# Patient Record
Sex: Male | Born: 1965
Health system: Southern US, Community
[De-identification: ages and names within clinical notes are randomized; demographics above are authoritative.]

## PROBLEM LIST (undated history)

## (undated) DIAGNOSIS — G4700 Insomnia, unspecified: Secondary | ICD-10-CM

## (undated) DIAGNOSIS — K3580 Unspecified acute appendicitis: Secondary | ICD-10-CM

## (undated) DIAGNOSIS — F1011 Alcohol abuse, in remission: Secondary | ICD-10-CM

## (undated) DIAGNOSIS — F419 Anxiety disorder, unspecified: Secondary | ICD-10-CM

## (undated) DIAGNOSIS — F329 Major depressive disorder, single episode, unspecified: Secondary | ICD-10-CM

## (undated) DIAGNOSIS — I10 Essential (primary) hypertension: Secondary | ICD-10-CM

## (undated) DIAGNOSIS — R079 Chest pain, unspecified: Secondary | ICD-10-CM

## (undated) DIAGNOSIS — M1990 Unspecified osteoarthritis, unspecified site: Secondary | ICD-10-CM

## (undated) HISTORY — DX: Unspecified acute appendicitis: K35.80

## (undated) HISTORY — DX: Alcohol abuse, in remission: F10.11

## (undated) HISTORY — PX: TONSILLECTOMY: SUR1361

## (undated) HISTORY — DX: Anxiety disorder, unspecified: F41.9

## (undated) HISTORY — DX: Unspecified osteoarthritis, unspecified site: M19.90

## (undated) HISTORY — PX: SEPTOPLASTY: SHX2393

## (undated) HISTORY — DX: Major depressive disorder, single episode, unspecified: F32.9

## (undated) HISTORY — DX: Insomnia, unspecified: G47.00

## (undated) HISTORY — DX: Chest pain, unspecified: R07.9

---

## 2004-05-22 ENCOUNTER — Ambulatory Visit: Payer: Self-pay | Admitting: Internal Medicine

## 2004-06-13 ENCOUNTER — Ambulatory Visit: Payer: Self-pay | Admitting: Internal Medicine

## 2004-12-03 ENCOUNTER — Ambulatory Visit: Payer: Self-pay | Admitting: Internal Medicine

## 2005-02-21 ENCOUNTER — Other Ambulatory Visit: Payer: Self-pay

## 2005-02-21 ENCOUNTER — Emergency Department: Payer: Self-pay | Admitting: Emergency Medicine

## 2005-02-21 ENCOUNTER — Encounter: Payer: Self-pay | Admitting: Internal Medicine

## 2005-06-30 ENCOUNTER — Emergency Department: Payer: Self-pay | Admitting: Emergency Medicine

## 2005-11-27 ENCOUNTER — Ambulatory Visit: Payer: Self-pay | Admitting: Internal Medicine

## 2006-05-14 ENCOUNTER — Ambulatory Visit: Payer: Self-pay | Admitting: Internal Medicine

## 2006-06-18 ENCOUNTER — Ambulatory Visit: Payer: Self-pay | Admitting: Internal Medicine

## 2006-11-18 ENCOUNTER — Telehealth: Payer: Self-pay | Admitting: Internal Medicine

## 2006-11-19 ENCOUNTER — Ambulatory Visit: Payer: Self-pay | Admitting: Internal Medicine

## 2006-11-19 DIAGNOSIS — R519 Headache, unspecified: Secondary | ICD-10-CM | POA: Insufficient documentation

## 2006-11-19 DIAGNOSIS — R51 Headache: Secondary | ICD-10-CM

## 2007-06-07 ENCOUNTER — Telehealth (INDEPENDENT_AMBULATORY_CARE_PROVIDER_SITE_OTHER): Payer: Self-pay | Admitting: *Deleted

## 2007-06-10 DIAGNOSIS — G479 Sleep disorder, unspecified: Secondary | ICD-10-CM | POA: Insufficient documentation

## 2007-06-10 DIAGNOSIS — M4800 Spinal stenosis, site unspecified: Secondary | ICD-10-CM | POA: Insufficient documentation

## 2007-06-10 DIAGNOSIS — F341 Dysthymic disorder: Secondary | ICD-10-CM

## 2007-06-10 DIAGNOSIS — F411 Generalized anxiety disorder: Secondary | ICD-10-CM | POA: Insufficient documentation

## 2007-08-09 ENCOUNTER — Telehealth (INDEPENDENT_AMBULATORY_CARE_PROVIDER_SITE_OTHER): Payer: Self-pay | Admitting: *Deleted

## 2007-09-29 ENCOUNTER — Telehealth: Payer: Self-pay | Admitting: Internal Medicine

## 2007-10-05 ENCOUNTER — Telehealth (INDEPENDENT_AMBULATORY_CARE_PROVIDER_SITE_OTHER): Payer: Self-pay | Admitting: *Deleted

## 2007-10-05 ENCOUNTER — Encounter (INDEPENDENT_AMBULATORY_CARE_PROVIDER_SITE_OTHER): Payer: Self-pay | Admitting: *Deleted

## 2007-12-03 ENCOUNTER — Telehealth (INDEPENDENT_AMBULATORY_CARE_PROVIDER_SITE_OTHER): Payer: Self-pay | Admitting: *Deleted

## 2007-12-07 ENCOUNTER — Ambulatory Visit: Payer: Self-pay | Admitting: Internal Medicine

## 2008-01-24 ENCOUNTER — Emergency Department: Payer: Self-pay | Admitting: Emergency Medicine

## 2008-01-24 ENCOUNTER — Other Ambulatory Visit: Payer: Self-pay

## 2008-03-29 ENCOUNTER — Telehealth (INDEPENDENT_AMBULATORY_CARE_PROVIDER_SITE_OTHER): Payer: Self-pay | Admitting: *Deleted

## 2008-04-27 ENCOUNTER — Telehealth: Payer: Self-pay | Admitting: Internal Medicine

## 2008-06-23 ENCOUNTER — Telehealth: Payer: Self-pay | Admitting: Internal Medicine

## 2008-09-22 ENCOUNTER — Telehealth: Payer: Self-pay | Admitting: Internal Medicine

## 2009-01-17 ENCOUNTER — Telehealth: Payer: Self-pay | Admitting: Internal Medicine

## 2009-02-26 ENCOUNTER — Telehealth: Payer: Self-pay | Admitting: Internal Medicine

## 2009-04-12 ENCOUNTER — Emergency Department: Payer: Self-pay

## 2013-01-15 DIAGNOSIS — R079 Chest pain, unspecified: Secondary | ICD-10-CM

## 2013-01-15 HISTORY — DX: Chest pain, unspecified: R07.9

## 2013-02-03 DIAGNOSIS — G4700 Insomnia, unspecified: Secondary | ICD-10-CM

## 2013-02-03 DIAGNOSIS — M1990 Unspecified osteoarthritis, unspecified site: Secondary | ICD-10-CM

## 2013-02-03 HISTORY — DX: Unspecified osteoarthritis, unspecified site: M19.90

## 2013-02-03 HISTORY — DX: Insomnia, unspecified: G47.00

## 2014-05-09 ENCOUNTER — Emergency Department: Payer: Self-pay | Admitting: Emergency Medicine

## 2014-11-20 ENCOUNTER — Encounter: Payer: Self-pay | Admitting: Urgent Care

## 2014-11-20 DIAGNOSIS — F10230 Alcohol dependence with withdrawal, uncomplicated: Secondary | ICD-10-CM | POA: Diagnosis present

## 2014-11-20 DIAGNOSIS — Z720 Tobacco use: Secondary | ICD-10-CM | POA: Diagnosis not present

## 2014-11-20 DIAGNOSIS — R197 Diarrhea, unspecified: Secondary | ICD-10-CM | POA: Diagnosis not present

## 2014-11-20 DIAGNOSIS — R112 Nausea with vomiting, unspecified: Secondary | ICD-10-CM | POA: Diagnosis not present

## 2014-11-20 DIAGNOSIS — M791 Myalgia: Secondary | ICD-10-CM | POA: Insufficient documentation

## 2014-11-20 NOTE — ED Notes (Signed)
Patient presents with multiple c/o. Patient reporting that he has generalized body aches, N/V/D and non-specific chest pain since last night. Patient is dizzy as well. Of note, patient advising that he is an alcoholic and has not had a drink in 3 days. No sweating or tremors noted in triage.

## 2014-11-21 ENCOUNTER — Emergency Department
Admission: EM | Admit: 2014-11-21 | Discharge: 2014-11-21 | Disposition: A | Discharge status: Home / Self Care | Payer: PRIVATE HEALTH INSURANCE | Attending: Emergency Medicine | Admitting: Emergency Medicine

## 2014-11-21 DIAGNOSIS — F10930 Alcohol use, unspecified with withdrawal, uncomplicated: Secondary | ICD-10-CM

## 2014-11-21 DIAGNOSIS — F10230 Alcohol dependence with withdrawal, uncomplicated: Secondary | ICD-10-CM

## 2014-11-21 LAB — BASIC METABOLIC PANEL
Anion gap: 13 (ref 5–15)
BUN: 17 mg/dL (ref 6–20)
CO2: 27 mmol/L (ref 22–32)
CREATININE: 1.16 mg/dL (ref 0.61–1.24)
Calcium: 9 mg/dL (ref 8.9–10.3)
Chloride: 98 mmol/L — ABNORMAL LOW (ref 101–111)
GLUCOSE: 120 mg/dL — AB (ref 65–99)
POTASSIUM: 3.6 mmol/L (ref 3.5–5.1)
Sodium: 138 mmol/L (ref 135–145)

## 2014-11-21 LAB — CBC
HCT: 49.8 % (ref 40.0–52.0)
HEMOGLOBIN: 17 g/dL (ref 13.0–18.0)
MCH: 34 pg (ref 26.0–34.0)
MCHC: 34.1 g/dL (ref 32.0–36.0)
MCV: 99.7 fL (ref 80.0–100.0)
PLATELETS: 301 10*3/uL (ref 150–440)
RBC: 4.99 MIL/uL (ref 4.40–5.90)
RDW: 13.4 % (ref 11.5–14.5)
WBC: 14.8 10*3/uL — AB (ref 3.8–10.6)

## 2014-11-21 LAB — ETHANOL

## 2014-11-21 LAB — TROPONIN I

## 2014-11-21 MED ORDER — LORAZEPAM 1 MG PO TABS: 1.0000 mg | ORAL_TABLET | Freq: Four times a day (QID) | ORAL | Status: DC | PRN

## 2014-11-21 MED ORDER — SODIUM CHLORIDE 0.9 % IV BOLUS (SEPSIS)
1000.0000 mL | Freq: Once | INTRAVENOUS | Status: AC
  Administered 2014-11-21: 1000 mL via INTRAVENOUS

## 2014-11-21 MED ORDER — ONDANSETRON HCL 4 MG/2ML IJ SOLN
4.0000 mg | Freq: Once | INTRAMUSCULAR | Status: AC
  Administered 2014-11-21: 4 mg via INTRAVENOUS

## 2014-11-21 MED ORDER — LORAZEPAM 1 MG PO TABS
ORAL_TABLET | ORAL | Status: AC
  Administered 2014-11-21: 1 mg
  Filled 2014-11-21: qty 1

## 2014-11-21 MED ORDER — LORAZEPAM 2 MG/ML IJ SOLN
1.0000 mg | Freq: Once | INTRAMUSCULAR | Status: AC
  Administered 2014-11-21: 1 mg via INTRAVENOUS

## 2014-11-21 MED ORDER — LORAZEPAM 2 MG/ML IJ SOLN
INTRAMUSCULAR | Status: AC
  Administered 2014-11-21: 1 mg via INTRAVENOUS
  Filled 2014-11-21: qty 1

## 2014-11-21 MED ORDER — ONDANSETRON HCL 4 MG/2ML IJ SOLN
INTRAMUSCULAR | Status: AC
  Administered 2014-11-21: 4 mg via INTRAVENOUS
  Filled 2014-11-21: qty 2

## 2014-11-21 NOTE — ED Notes (Signed)

## 2014-11-21 NOTE — ED Provider Notes (Signed)
[facility] Emergency Department Provider Note  ____________________________________________  Time seen: On arrival to room  I have reviewed the triage vital signs and the nursing notes.   HISTORY  Chief Complaint Withdrawal; Diarrhea; Chest Pain; and Generalized Body Aches      HPI Ryan Hatfield is a 49 y.o. male presents with "alcohol withdrawal". Patient admits to generalized body aches nausea vomiting and diarrhea since last night. Patient states last alcohol intake was 3 days ago. Patient admits to drinking heavily daily "I'm a alcoholic".     History reviewed. No pertinent past medical history.  Patient Active Problem List   Diagnosis Date Noted  . ANXIETY 06/10/2007  . DYSTHYMIA 06/10/2007  . SPINAL STENOSIS 06/10/2007  . SLEEP DISORDER 06/10/2007  . HEADACHE 11/19/2006    Past Surgical History  Procedure Laterality Date  . Septoplasty      No current outpatient prescriptions on file.  Allergies Paroxetine and Sertraline hcl  No family history on file.  Social History History  Substance Use Topics  . Smoking status: Current Every Day Smoker  . Smokeless tobacco: Not on file  . Alcohol Use: Yes     Comment: last drink x 3 days ago    Review of Systems  Constitutional: Negative for fever. Eyes: Negative for visual changes. ENT: Negative for sore throat. Cardiovascular: Negative for chest pain. Respiratory: Negative for shortness of breath. Gastrointestinal: Negative for abdominal pain, vomiting and diarrhea. Genitourinary: Negative for dysuria. Musculoskeletal: Positive for generalized muscle aches Skin: Negative for rash. Neurological: Negative for headaches, focal weakness or numbness.   10-point ROS otherwise negative.  ____________________________________________   PHYSICAL EXAM:  VITAL SIGNS: ED Triage Vitals  Enc Vitals Group     BP 11/20/14 2225 193/88 mmHg     Pulse Rate 11/20/14 2225 112   Resp 11/20/14 2225 20     Temp 11/20/14 2225 98.2 F (36.8 C)     Temp Source 11/20/14 2225 Oral     SpO2 11/20/14 2225 97 %     Weight 11/20/14 2225 220 lb (99.791 kg)     Height 11/20/14 2225 5\' 9"  (1.753 m)     Head Cir --      Peak Flow --      Pain Score 11/20/14 2239 5     Pain Loc --      Pain Edu? --    Constitutional: Alert and oriented. Well appearing and in no distress. Eyes: Conjunctivae are normal. PERRL. Normal extraocular movements. ENT   Head: Normocephalic and atraumatic.   Nose: No congestion/rhinnorhea.   Mouth/Throat: Mucous membranes are moist.   Neck: No stridor.  Cardiovascular: Normal rate, regular rhythm. Normal and symmetric distal pulses are present in all extremities. No murmurs, rubs, or gallops. Respiratory: Normal respiratory effort without tachypnea nor retractions. Breath sounds are clear and equal bilaterally. No wheezes/rales/rhonchi. Gastrointestinal: Soft and nontender. No distention. There is no CVA tenderness. Genitourinary: deferred Musculoskeletal: Nontender with normal range of motion in all extremities. No joint effusions.  No lower extremity tenderness nor edema. Neurologic:  Normal speech and language. No gross focal neurologic deficits are appreciated. Speech is normal.  Skin:  Skin is warm, dry and intact. No rash noted. Psychiatric: Mood and affect are normal. Speech and behavior are normal. Patient exhibits appropriate insight and judgment.  ____________________________________________    LABS (pertinent positives/negatives)  Labs Reviewed  CBC - Abnormal; Notable for the following:    WBC 14.8 (*)    All other  components within normal limits  BASIC METABOLIC PANEL - Abnormal; Notable for the following:    Chloride 98 (*)    Glucose, Bld 120 (*)    All other components within normal limits  TROPONIN I  ETHANOL     ____________________________________________       INITIAL IMPRESSION / ASSESSMENT AND  PLAN / ED COURSE  Pertinent labs & imaging results that were available during my care of the patient were reviewed by me and considered in my medical decision making (see chart for details).  History of physical exam consistent with alcohol withdrawal. As such patient received Ativan 1 mg IV with improvement of symptoms. Offered admission for alcohol withdrawal however patient states he would prefer to do outpatient. We'll prescribe Ativan at home for outpatient. Patient advised of the danger of drinking while taking Ativan.  ____________________________________________   FINAL CLINICAL IMPRESSION(S) / ED DIAGNOSES  Final diagnoses:  Alcohol withdrawal, uncomplicated      Gregor Hams, MD 11/21/14 [phone]

## 2014-11-21 NOTE — Discharge Instructions (Signed)
Alcohol Withdrawal °Anytime drug use is interfering with normal living activities it has become abuse. This includes problems with family and friends. Psychological dependence has developed when your mind tells you that the drug is needed. This is usually followed by physical dependence when a continuing increase of drugs are required to get the same feeling or "high." This is known as addiction or chemical dependency. A person's risk is much higher if there is a history of chemical dependency in the family. °Mild Withdrawal Following Stopping Alcohol, When Addiction or Chemical Dependency Has Developed °When a person has developed tolerance to alcohol, any sudden stopping of alcohol can cause uncomfortable physical symptoms. Most of the time these are mild and consist of tremors in the hands and increases in heart rate, breathing, and temperature. Sometimes these symptoms are associated with anxiety, panic attacks, and bad dreams. There may also be stomach upset. Normal sleep patterns are often interrupted with periods of inability to sleep (insomnia). This may last for 6 months. Because of this discomfort, many people choose to continue drinking to get rid of this discomfort and to try to feel normal. °Severe Withdrawal with Decreased or No Alcohol Intake, When Addiction or Chemical Dependency Has Developed °About five percent of alcoholics will develop signs of severe withdrawal when they stop using alcohol. One sign of this is development of generalized seizures (convulsions). Other signs of this are severe agitation and confusion. This may be associated with believing in things which are not real or seeing things which are not really there (delusions and hallucinations). Vitamin deficiencies are usually present if alcohol intake has been long-term. Treatment for this most often requires hospitalization and close observation. °Addiction can only be helped by stopping use of all chemicals. This is hard but may  save your life. With continual alcohol use, possible outcomes are usually loss of self respect and esteem, violence, and death. °Addiction cannot be cured but it can be stopped. This often requires outside help and the care of professionals. Treatment centers are listed in the yellow pages under Cocaine, Narcotics, and Alcoholics Anonymous. Most hospitals and clinics can refer you to a specialized care center. °It is not necessary for you to go through the uncomfortable symptoms of withdrawal. Your caregiver can provide you with medicines that will help you through this difficult period. Try to avoid situations, friends, or drugs that made it possible for you to keep using alcohol in the past. Learn how to say no. °It takes a long period of time to overcome addictions to all drugs, including alcohol. There may be many times when you feel as though you want a drink. After getting rid of the physical addiction and withdrawal, you will have a lessening of the craving which tells you that you need alcohol to feel normal. Call your caregiver if more support is needed. Learn who to talk to in your family and among your friends so that during these periods you can receive outside help. Alcoholics Anonymous (AA) has helped many people over the years. To get further help, contact AA or call your caregiver, counselor, or clergyperson. Al-Anon and Alateen are support groups for friends and family members of an alcoholic. The people who love and care for an alcoholic often need help, too. For information about these organizations, check your phone directory or call a local alcoholism treatment center.  °SEEK IMMEDIATE MEDICAL CARE IF:  °· You have a seizure. °· You have a fever. °· You experience uncontrolled vomiting or you   vomit up blood. This may be bright red or look like black coffee grounds. °· You have blood in the stool. This may be bright red or appear as a black, tarry, bad-smelling stool. °· You become lightheaded or  faint. Do not drive if you feel this way. Have someone else drive you or call 911 for help. °· You become more agitated or confused. °· You develop uncontrolled anxiety. °· You begin to see things that are not really there (hallucinate). °Your caregiver has determined that you completely understand your medical condition, and that your mental state is back to normal. You understand that you have been treated for alcohol withdrawal, have agreed not to drink any alcohol for a minimum of 1 day, will not operate a car or other machinery for 24 hours, and have had an opportunity to ask any questions about your condition. °Document Released: 04/02/2005 Document Revised: 09/15/2011 Document Reviewed: 02/09/2008 °ExitCare® Patient Information ©2015 [employer]. This information is not intended to replace advice given to you by your health care provider. Make sure you discuss any questions you have with your health care provider. ° °

## 2015-05-29 ENCOUNTER — Ambulatory Visit (INDEPENDENT_AMBULATORY_CARE_PROVIDER_SITE_OTHER): Payer: Managed Care, Other (non HMO) | Admitting: Internal Medicine

## 2015-05-29 ENCOUNTER — Ambulatory Visit (INDEPENDENT_AMBULATORY_CARE_PROVIDER_SITE_OTHER)
Admission: RE | Admit: 2015-05-29 | Discharge: 2015-05-29 | Disposition: A | Discharge status: Home / Self Care | Payer: Managed Care, Other (non HMO) | Source: Ambulatory Visit | Attending: Internal Medicine | Admitting: Internal Medicine

## 2015-05-29 ENCOUNTER — Encounter (INDEPENDENT_AMBULATORY_CARE_PROVIDER_SITE_OTHER): Payer: Self-pay

## 2015-05-29 ENCOUNTER — Encounter: Payer: Self-pay | Admitting: Internal Medicine

## 2015-05-29 VITALS — BP 120/56 | HR 57 | Temp 98.0°F | Wt 242.0 lb

## 2015-05-29 DIAGNOSIS — Z720 Tobacco use: Secondary | ICD-10-CM

## 2015-05-29 DIAGNOSIS — F418 Other specified anxiety disorders: Secondary | ICD-10-CM

## 2015-05-29 DIAGNOSIS — M25532 Pain in left wrist: Secondary | ICD-10-CM | POA: Diagnosis not present

## 2015-05-29 DIAGNOSIS — F419 Anxiety disorder, unspecified: Secondary | ICD-10-CM

## 2015-05-29 DIAGNOSIS — F32A Depression, unspecified: Secondary | ICD-10-CM

## 2015-05-29 DIAGNOSIS — R5383 Other fatigue: Secondary | ICD-10-CM

## 2015-05-29 DIAGNOSIS — Z833 Family history of diabetes mellitus: Secondary | ICD-10-CM | POA: Diagnosis not present

## 2015-05-29 DIAGNOSIS — F329 Major depressive disorder, single episode, unspecified: Secondary | ICD-10-CM

## 2015-05-29 DIAGNOSIS — F1011 Alcohol abuse, in remission: Secondary | ICD-10-CM | POA: Insufficient documentation

## 2015-05-29 DIAGNOSIS — F17200 Nicotine dependence, unspecified, uncomplicated: Secondary | ICD-10-CM

## 2015-05-29 DIAGNOSIS — R600 Localized edema: Secondary | ICD-10-CM

## 2015-05-29 DIAGNOSIS — M79641 Pain in right hand: Secondary | ICD-10-CM

## 2015-05-29 DIAGNOSIS — F1010 Alcohol abuse, uncomplicated: Secondary | ICD-10-CM

## 2015-05-29 DIAGNOSIS — M79642 Pain in left hand: Secondary | ICD-10-CM

## 2015-05-29 DIAGNOSIS — E559 Vitamin D deficiency, unspecified: Secondary | ICD-10-CM

## 2015-05-29 HISTORY — DX: Alcohol abuse, in remission: F10.11

## 2015-05-29 HISTORY — DX: Depression, unspecified: F32.A

## 2015-05-29 HISTORY — DX: Anxiety disorder, unspecified: F41.9

## 2015-05-29 LAB — CBC
HCT: 44.8 % (ref 39.0–52.0)
Hemoglobin: 14.9 g/dL (ref 13.0–17.0)
MCHC: 33.4 g/dL (ref 30.0–36.0)
MCV: 90.2 fl (ref 78.0–100.0)
Platelets: 272 10*3/uL (ref 150.0–400.0)
RBC: 4.97 Mil/uL (ref 4.22–5.81)
RDW: 13.7 % (ref 11.5–15.5)
WBC: 11.3 10*3/uL — AB (ref 4.0–10.5)

## 2015-05-29 LAB — COMPREHENSIVE METABOLIC PANEL
ALBUMIN: 4.3 g/dL (ref 3.5–5.2)
ALT: 14 U/L (ref 0–53)
AST: 18 U/L (ref 0–37)
Alkaline Phosphatase: 81 U/L (ref 39–117)
BUN: 15 mg/dL (ref 6–23)
CHLORIDE: 102 meq/L (ref 96–112)
CO2: 29 mEq/L (ref 19–32)
CREATININE: 1.02 mg/dL (ref 0.40–1.50)
Calcium: 9.5 mg/dL (ref 8.4–10.5)
GFR: 81.59 mL/min (ref >60.00)
Glucose, Bld: 113 mg/dL — ABNORMAL HIGH (ref 70–99)
Potassium: 3.8 mEq/L (ref 3.5–5.1)
Sodium: 139 mEq/L (ref 135–145)
TOTAL PROTEIN: 7.2 g/dL (ref 6.0–8.3)
Total Bilirubin: 0.6 mg/dL (ref 0.2–1.2)

## 2015-05-29 LAB — VITAMIN B12: VITAMIN B 12: 255 pg/mL (ref 211–911)

## 2015-05-29 LAB — TSH: TSH: 4.43 u[IU]/mL (ref 0.35–4.50)

## 2015-05-29 LAB — HEMOGLOBIN A1C: HEMOGLOBIN A1C: 5.7 % (ref 4.6–6.5)

## 2015-05-29 LAB — VITAMIN D 25 HYDROXY (VIT D DEFICIENCY, FRACTURES): VITD: 14.39 ng/mL — AB (ref 30.00–100.00)

## 2015-05-29 LAB — BRAIN NATRIURETIC PEPTIDE: Pro B Natriuretic peptide (BNP): 14 pg/mL (ref 0.0–100.0)

## 2015-05-29 NOTE — Progress Notes (Signed)
Pre visit review using our clinic review tool, if applicable. No additional management support is needed unless otherwise documented below in the visit note. 

## 2015-05-29 NOTE — Progress Notes (Signed)
HPI  Pt presents to the clinic today to establish care and for management of the conditions listed below. He is transferring care from Dr. Silvio Pate, but he has not seen him in the last 6 years.  He does c/o bilateral hand pain. This started > 1 year ago. The left seems worse than the right. He reports the pain is constants. It aches all the time. He can have sharp, stabbing pain with movement. He has noticed some swelling of the hands and wrist but denies any redness or warmth. He denies any injury to the area. He has no history of gout. He has tried Ibuprofen with some relief.  History of alcohol abuse: He has been sober for 5 months.  Anxiety and Depression: Chronic but he is not medicated. He feels like his symptoms were worse when he was drinking. He reports he actually feels pretty good right now. He denies SI/HI.  Smoker: 1 ppd x 40 years. He is not interested in quitting.  Flu: He reports he has already had on this year Tetanus: 2001 PSA Screening: > 5 years ago Colon Screening: > 5 years ago Vision Screening: as needed Dentist: as needed  Past Medical History  Diagnosis Date  . Anxiety   . Depression     Current Outpatient Prescriptions  Medication Sig Dispense Refill  . ibuprofen (ADVIL,MOTRIN) 100 MG tablet Take 100 mg by mouth every 6 (six) hours as needed for fever.     No current facility-administered medications for this visit.    Allergies  Allergen Reactions  . Paroxetine     REACTION: paranoia, confusion  . Sertraline Hcl     REACTION: paranoia, confusion    Family History  Problem Relation Age of Onset  . Heart disease Mother   . Arthritis Mother   . Heart disease Father   . Diabetes Brother   . Stroke Brother   . Cancer Maternal Grandmother     Social History   Social History  . Marital Status: Divorced    Spouse Name: N/A  . Number of Children: N/A  . Years of Education: N/A   Occupational History  . Not on file.   Social History Main  Topics  . Smoking status: Current Every Day Smoker -- 1.00 packs/day for 40 years  . Smokeless tobacco: Not on file  . Alcohol Use: No     Comment: last drink x 3 days ago  . Drug Use: No  . Sexual Activity: Yes   Other Topics Concern  . Not on file   Social History Narrative    ROS:  Constitutional: Pt reports weight gain and fatigue. Denies fever, malaise, headache.  Respiratory: Pt reports shortness of breath at times. Denies difficulty breathing, cough or sputum production.   Cardiovascular: Denies chest pain, chest tightness, palpitations or swelling in the hands or feet.  Gastrointestinal: Denies abdominal pain, bloating, constipation, diarrhea or blood in the stool.  GU: Denies frequency, urgency, pain with urination, blood in urine, odor or discharge. Musculoskeletal: Pt reports bilateral hand pain and swelling. Denies decrease in range of motion, difficulty with gait, muscle pain.  Skin: Denies redness, rashes, lesions or ulcercations.  Neurological: Pt reports numbness in her hands. Denies dizziness, difficulty with memory, difficulty with speech or problems with balance and coordination.  Psych: Pt reports chronic anxiety and depression. Denies SI/HI.  No other specific complaints in a complete review of systems (except as listed in HPI above).  PE:  BP 120/56 mmHg  Pulse  57  Temp(Src) 98 F (36.7 C) (Oral)  Wt 242 lb (109.77 kg)  SpO2 98% Wt Readings from Last 3 Encounters:  05/29/15 242 lb (109.77 kg)  11/20/14 220 lb (99.791 kg)  12/07/07 206 lb 6.1 oz (93.614 kg)    General: Appears his stated age, obese in NAD. Skin: Warm, dry and intact. No redness or warmth noted. HEENT: mucous membranes moist. No lesions noted. Cardiovascular: Normal rate and rhythm. S1,S2 noted.  No murmur, rubs or gallops noted. Trace BLE edema. No carotid bruits noted. Pulmonary/Chest: Normal effort, diminished with fine crackles in the bases. No respiratory distress. No wheezes, or  ronchi noted.  Musculoskeletal: Normal flexion and extension of bilateral wrist. He has decreased rotation secondary to pain. No swelling noted today. He does have some enlarged joints in his fingers bilaterally. He has pain with palpation of the left medial wrist over the extensor service of the thumb.  Neurological: Alert and oriented.  Psychiatric: Mood and affect normal. Behavior is normal. Judgment and thought content normal.    BMET    Component Value Date/Time   NA 138 11/20/2014 2203   K 3.6 11/20/2014 2203   CL 98* 11/20/2014 2203   CO2 27 11/20/2014 2203   GLUCOSE 120* 11/20/2014 2203   BUN 17 11/20/2014 2203   CREATININE 1.16 11/20/2014 2203   CALCIUM 9.0 11/20/2014 2203   GFRNONAA >60 11/20/2014 2203   GFRAA >60 11/20/2014 2203    Lipid Panel  No results found for: CHOL, TRIG, HDL, CHOLHDL, VLDL, LDLCALC  CBC    Component Value Date/Time   WBC 14.8* 11/20/2014 2203   RBC 4.99 11/20/2014 2203   HGB 17.0 11/20/2014 2203   HCT 49.8 11/20/2014 2203   PLT 301 11/20/2014 2203   MCV 99.7 11/20/2014 2203   MCH 34.0 11/20/2014 2203   MCHC 34.1 11/20/2014 2203   RDW 13.4 11/20/2014 2203    Hgb A1C No results found for: HGBA1C   Assessment and Plan:  Smoker:  ? Wether he has COPD or emphysema Unmotivated to quit Chest xray today  Bilateral hand pain:  ? Arthritis versus tendonitis Will check xray of left wrist today If xray normal, consider burst of Prednisone Try Aleve once daily  Alcohol abuse, in remission:  Support offered today  Anxiety and Depression:  Chronic but stable off meds.  Will continue to monitor  Bilateral leg edema:  Given crackles in lungs, will check CBC, CMET and BNP today  Fatigue:  TSH, Vit D and B12 today  Family history of DM 2:  Will check A1C today  Make an appt for your annual exam

## 2015-05-29 NOTE — Patient Instructions (Signed)
Wrist Pain There are many things that can cause wrist pain. Some common causes include:  An injury to the wrist area, such as a sprain, strain, or fracture.  Overuse of the joint.  A condition that causes increased pressure on a nerve in the wrist (carpal tunnel syndrome).  Wear and tear of the joints that occurs with aging (osteoarthritis).  A variety of other types of arthritis. Sometimes, the cause of wrist pain is not known. The pain often goes away when you follow your health care provider's instructions for relieving pain at home. If your wrist pain continues, tests may need to be done to diagnose your condition. HOME CARE INSTRUCTIONS Pay attention to any changes in your symptoms. Take these actions to help with your pain:  Rest the wrist area for at least 48 hours or as told by your health care provider.  If directed, apply ice to the injured area:  Put ice in a plastic bag.  Place a towel between your skin and the bag.  Leave the ice on for 20 minutes, 2-3 times per day.  Keep your arm raised (elevated) above the level of your heart while you are sitting or lying down.  If a splint or elastic bandage has been applied, use it as told by your health care provider.  Remove the splint or bandage only as told by your health care provider.  Loosen the splint or bandage if your fingers become numb or have a tingling feeling, or if they turn cold or blue.  Take over-the-counter and prescription medicines only as told by your health care provider.  Keep all follow-up visits as told by your health care provider. This is important. SEEK MEDICAL CARE IF:  Your pain is not helped by treatment.  Your pain gets worse. SEEK IMMEDIATE MEDICAL CARE IF:  Your fingers become swollen.  Your fingers turn white, very red, or cold and blue.  Your fingers are numb or have a tingling feeling.  You have difficulty moving your fingers.   This information is not intended to replace  advice given to you by your health care provider. Make sure you discuss any questions you have with your health care provider.   Document Released: 04/02/2005 Document Revised: 03/14/2015 Document Reviewed: 11/08/2014 Elsevier Interactive Patient Education 2016 [employer]  

## 2015-05-30 MED ORDER — VITAMIN D (ERGOCALCIFEROL) 1.25 MG (50000 UNIT) PO CAPS: 50000.0000 [IU] | ORAL_CAPSULE | ORAL | Status: DC

## 2015-05-30 NOTE — Addendum Note (Signed)
Addended by: Lurlean Nanny on: 05/30/2015 04:21 PM   Modules accepted: Orders

## 2015-06-01 ENCOUNTER — Other Ambulatory Visit: Payer: Self-pay | Admitting: Internal Medicine

## 2015-06-04 ENCOUNTER — Other Ambulatory Visit: Payer: Self-pay | Admitting: Internal Medicine

## 2015-06-04 MED ORDER — PREDNISONE 20 MG PO TABS: ORAL_TABLET | ORAL | Status: DC

## 2015-06-11 ENCOUNTER — Encounter: Payer: Self-pay | Admitting: Internal Medicine

## 2015-06-11 ENCOUNTER — Ambulatory Visit (INDEPENDENT_AMBULATORY_CARE_PROVIDER_SITE_OTHER): Payer: Managed Care, Other (non HMO) | Admitting: Internal Medicine

## 2015-06-11 ENCOUNTER — Other Ambulatory Visit: Payer: Self-pay | Admitting: Internal Medicine

## 2015-06-11 VITALS — BP 138/60 | HR 95 | Temp 98.0°F | Ht 69.0 in | Wt 244.8 lb

## 2015-06-11 DIAGNOSIS — Z0001 Encounter for general adult medical examination with abnormal findings: Secondary | ICD-10-CM

## 2015-06-11 DIAGNOSIS — R5383 Other fatigue: Secondary | ICD-10-CM | POA: Diagnosis not present

## 2015-06-11 DIAGNOSIS — R7989 Other specified abnormal findings of blood chemistry: Secondary | ICD-10-CM | POA: Diagnosis not present

## 2015-06-11 DIAGNOSIS — M79641 Pain in right hand: Secondary | ICD-10-CM

## 2015-06-11 DIAGNOSIS — M79642 Pain in left hand: Secondary | ICD-10-CM

## 2015-06-11 DIAGNOSIS — Z1211 Encounter for screening for malignant neoplasm of colon: Secondary | ICD-10-CM

## 2015-06-11 DIAGNOSIS — Z0000 Encounter for general adult medical examination without abnormal findings: Secondary | ICD-10-CM

## 2015-06-11 DIAGNOSIS — Z125 Encounter for screening for malignant neoplasm of prostate: Secondary | ICD-10-CM | POA: Diagnosis not present

## 2015-06-11 LAB — LIPID PANEL
Cholesterol: 192 mg/dL (ref 0–200)
HDL: 36.6 mg/dL — AB (ref >39.00)
NonHDL: 155.15
TRIGLYCERIDES: 257 mg/dL — AB (ref 0.0–149.0)
Total CHOL/HDL Ratio: 5
VLDL: 51.4 mg/dL — ABNORMAL HIGH (ref 0.0–40.0)

## 2015-06-11 LAB — TESTOSTERONE: Testosterone: 211.83 ng/dL — ABNORMAL LOW (ref 300.00–890.00)

## 2015-06-11 LAB — LDL CHOLESTEROL, DIRECT: Direct LDL: 123 mg/dL

## 2015-06-11 LAB — PSA: PSA: 1.09 ng/mL (ref 0.10–4.00)

## 2015-06-11 NOTE — Progress Notes (Signed)
Subjective:    Patient ID: Ryan Hatfield, male    DOB: August 13, 1965, 49 y.o.   MRN: [id number]  HPI  Pt presents to the clinic today for his annual exam.  Flu: He had one at work 04/2015 Tetanus: 2001 PSA Screening: > 5 years ago Colon Screening: > 5 years ago Vision Screening: as needed Dentist: as needed  Diet: He does consume meat. He eats fruits and veggies daily. He does consume fried foods. He drinks mostly water and coffee. Exercise: He is not exercising.  Review of Systems      Past Medical History  Diagnosis Date  . Anxiety   . Depression     Current Outpatient Prescriptions  Medication Sig Dispense Refill  . ibuprofen (ADVIL,MOTRIN) 100 MG tablet Take 100 mg by mouth every 6 (six) hours as needed for fever.    . predniSONE (DELTASONE) 20 MG tablet Take 3 tabs on days 1-3, take 2 tabs on days 4-6, take 1 tab on days 7-9 18 tablet 0  . Vitamin D, Ergocalciferol, (DRISDOL) 50000 UNITS CAPS capsule Take 1 capsule (50,000 Units total) by mouth every 7 (seven) days. 12 capsule 0   No current facility-administered medications for this visit.    Allergies  Allergen Reactions  . Paroxetine     REACTION: paranoia, confusion  . Sertraline Hcl     REACTION: paranoia, confusion    Family History  Problem Relation Age of Onset  . Heart disease Mother   . Arthritis Mother   . Heart disease Father   . Diabetes Brother   . Stroke Brother   . Cancer Maternal Grandmother     Social History   Social History  . Marital Status: Divorced    Spouse Name: N/A  . Number of Children: N/A  . Years of Education: N/A   Occupational History  . Not on file.   Social History Main Topics  . Smoking status: Current Every Day Smoker -- 1.00 packs/day for 40 years  . Smokeless tobacco: Not on file  . Alcohol Use: No     Comment: last drink x 3 days ago  . Drug Use: No  . Sexual Activity: Yes   Other Topics Concern  . Not on file   Social History Narrative      Constitutional: Pt reports fatigue. Denies fever, malaise, headache or abrupt weight changes.  HEENT: Pt reports blurred vision. Denies eye pain, eye redness, ear pain, ringing in the ears, wax buildup, runny nose, nasal congestion, bloody nose, or sore throat. Respiratory: Denies difficulty breathing, shortness of breath, cough or sputum production.   Cardiovascular: Denies chest pain, chest tightness, palpitations or swelling in the hands or feet.  Gastrointestinal: Pt reports occasional reflux. Denies abdominal pain, bloating, constipation, diarrhea or blood in the stool.  GU: Denies urgency, frequency, pain with urination, burning sensation, blood in urine, odor or discharge. Musculoskeletal: Pt reports left wrist pain. Denies decrease in range of motion, difficulty with gait, muscle pain or joint swelling.  Skin: Denies redness, rashes, lesions or ulcercations.  Neurological: Pt reports numbness and tingling in his hands and feet. Denies dizziness, difficulty with memory, difficulty with speech or problems with balance and coordination.  Psych: Denies anxiety, depression, SI/HI.  No other specific complaints in a complete review of systems (except as listed in HPI above).  Objective:   Physical Exam  BP 138/60 mmHg  Pulse 95  Temp(Src) 98 F (36.7 C) (Oral)  Ht 5\' 9"  (1.753 m)  Wt 244 lb 12.8 oz (111.041 kg)  BMI 36.13 kg/m2  SpO2 96% Wt Readings from Last 3 Encounters:  06/11/15 244 lb 12.8 oz (111.041 kg)  05/29/15 242 lb (109.77 kg)  11/20/14 220 lb (99.791 kg)    General: Appears his stated age, obese in NAD. Skin: Warm, dry and intact.  HEENT: Head: normal shape and size; Eyes: sclera white, no icterus, conjunctiva pink, PERRLA and EOMs intact; Left Ears: Tm's gray and intact, normal light reflex; Right Ear; cerumen impaction Throat/Mouth: Teeth present, mucosa pink and moist, no exudate, lesions or ulcerations noted.  Neck:  Neck supple, trachea midline. No masses,  lumps or thyromegaly present.  Cardiovascular: Normal rate and rhythm. S1,S2 noted.  No murmur, rubs or gallops noted. Trace BLE edema. No carotid bruits noted. Radial pulse 2+ bilaterally. Pulmonary/Chest: Normal effort and diminished vesicular breath sounds. No respiratory distress. No wheezes, rales or ronchi noted.  Abdomen: Distended but soft and nontender. Normal bowel sounds. No distention or masses noted. Liver, spleen and kidneys non palpable. Musculoskeletal: Normal flexion and extension of bilateral wrist. He has decreased rotation secondary to pain. No swelling noted today. He does have some enlarged joints in his fingers bilaterally. He has pain with palpation of the left medial wrist over the extensor service of the thumb. Strength 5/5 BUElBLE. No difficulty with gait.  Neurological: Alert and oriented. Cranial nerves II-XII grossly intact. Coordination normal. Negative Phalen's. Negative Tinel's. Psychiatric: Mood and affect normal. Behavior is normal. Judgment and thought content normal.    BMET    Component Value Date/Time   NA 139 05/29/2015 1000   K 3.8 05/29/2015 1000   CL 102 05/29/2015 1000   CO2 29 05/29/2015 1000   GLUCOSE 113* 05/29/2015 1000   BUN 15 05/29/2015 1000   CREATININE 1.02 05/29/2015 1000   CALCIUM 9.5 05/29/2015 1000   GFRNONAA >60 11/20/2014 2203   GFRAA >60 11/20/2014 2203    Lipid Panel  No results found for: CHOL, TRIG, HDL, CHOLHDL, VLDL, LDLCALC  CBC    Component Value Date/Time   WBC 11.3* 05/29/2015 1000   RBC 4.97 05/29/2015 1000   HGB 14.9 05/29/2015 1000   HCT 44.8 05/29/2015 1000   PLT 272.0 05/29/2015 1000   MCV 90.2 05/29/2015 1000   MCH 34.0 11/20/2014 2203   MCHC 33.4 05/29/2015 1000   RDW 13.7 05/29/2015 1000    Hgb A1C Lab Results  Component Value Date   HGBA1C 5.7 05/29/2015         Assessment & Plan:   Preventative Health Maintenance:  Encouraged him to consume a balanced diet, avoid fried foods and start  an exercise regimen. Flu shot UTD He declines tetanus booster and pneumovax vaccine He declines screening for AAA or lung cancer Encouraged him to see an eye doctor and dentist annually He declines colonoscopy, but is agreeable to IFOB Recent labs reviewed- will get Lipid Profile, HIV, Hep C and PSA today  Fatigue:  He was found to be Vit D deficient, now on Ergocalciferol x 12 weeks He would like to have his Testosterone levels checked today  Bilateral hand pain:  Prednisone is not helping Will have him finish taper If pain persist, consider referral to neurology for EMG testing  RTC in 6 months or sooner if needed

## 2015-06-11 NOTE — Addendum Note (Signed)
Addended by: Jearld Fenton on: 06/11/2015 09:06 AM   Modules accepted: Miquel Dunn

## 2015-06-11 NOTE — Patient Instructions (Signed)

## 2015-06-12 LAB — HEPATITIS C ANTIBODY: HCV Ab: NEGATIVE

## 2015-06-12 LAB — HIV ANTIBODY (ROUTINE TESTING W REFLEX): HIV: NONREACTIVE

## 2015-06-13 ENCOUNTER — Telehealth: Payer: Self-pay | Admitting: Internal Medicine

## 2015-06-13 ENCOUNTER — Other Ambulatory Visit: Payer: Self-pay | Admitting: Internal Medicine

## 2015-06-13 DIAGNOSIS — E349 Endocrine disorder, unspecified: Secondary | ICD-10-CM

## 2015-06-13 NOTE — Telephone Encounter (Signed)
Pt returned call - he does want to be referred to a urologist - please call [phone]  Thank you

## 2015-06-19 ENCOUNTER — Encounter: Payer: Self-pay | Admitting: *Deleted

## 2015-06-20 ENCOUNTER — Ambulatory Visit: Payer: Self-pay | Admitting: Obstetrics and Gynecology

## 2015-06-21 ENCOUNTER — Ambulatory Visit (INDEPENDENT_AMBULATORY_CARE_PROVIDER_SITE_OTHER): Payer: Managed Care, Other (non HMO) | Admitting: Obstetrics and Gynecology

## 2015-06-21 ENCOUNTER — Encounter: Payer: Self-pay | Admitting: Obstetrics and Gynecology

## 2015-06-21 VITALS — BP 131/89 | HR 92 | Resp 16 | Ht 69.0 in | Wt 240.8 lb

## 2015-06-21 DIAGNOSIS — R5383 Other fatigue: Secondary | ICD-10-CM | POA: Diagnosis not present

## 2015-06-21 DIAGNOSIS — E291 Testicular hypofunction: Secondary | ICD-10-CM | POA: Diagnosis not present

## 2015-06-21 DIAGNOSIS — R7989 Other specified abnormal findings of blood chemistry: Secondary | ICD-10-CM

## 2015-06-21 NOTE — Progress Notes (Addendum)
06/21/2015 10:45 AM   Ryan Hatfield 29-May-1966 [id number]  Referring provider: Jearld Fenton, NP [address] [place], [place] [address]  Chief Complaint  Patient presents with  . Hypotestosteronemia  . Establish Care    HPI: Patient is a 49yo male presenting today as a referral from his PCP for low testosterone. Patient reports symptoms of fatigue, low libido and weight gain of 40lbs over the last year.  He states that he used to be a very heavy alcohol drinker but stopped drinking approximately a year ago and has not noticed any improvement in his symptoms. He does report that he has decreased his physical activity due to fatigue. He does not feel that he has significantly changed his diet recenlty but reports that he does routinely eat fried foods but very rarely eats fast food. He denies increased symptoms of depression or irritability but states that he has felt more "nervous and jumpy" recently.  No urinary symptoms. No exacerbating or alleviating factors.  Royal: grandfather- prostate cancer  06/11/15 Testosterone 211.83 PSA 1.09 Triglycerides 257 elevated  05/29/15 TSH 4.43 Hct 44.8 normal Vit D 14.39 low- on supplement Vit B12 255 normal  PMH: Past Medical History  Diagnosis Date  . Anxiety and depression 05/29/2015  . Alcohol abuse, in remission 05/29/2015  . Chest pain 01/15/2013  . Cannot sleep 02/03/2013  . Arthritis, degenerative 02/03/2013    Overview:  Of right middle finger MCP joint     Surgical History: Past Surgical History  Procedure Laterality Date  . Septoplasty    . Tonsillectomy      Home Medications:    Medication List       This list is accurate as of: 06/21/15 10:45 AM.  Always use your most recent med list.               FISH OIL BURP-LESS PO  Take 1 capsule by mouth daily.     ibuprofen 100 MG tablet  Commonly known as:  ADVIL,MOTRIN  Take 100 mg by mouth every 6 (six) hours as needed for fever.     Vitamin D  (Ergocalciferol) 50000 UNITS Caps capsule  Commonly known as:  DRISDOL  Take 1 capsule (50,000 Units total) by mouth every 7 (seven) days.        Allergies:  Allergies  Allergen Reactions  . Paroxetine     REACTION: paranoia, confusion  . Sertraline Hcl     REACTION: paranoia, confusion    Family History: Family History  Problem Relation Age of Onset  . Heart disease Mother   . Arthritis Mother   . Heart disease Father   . Diabetes Brother   . Stroke Brother   . Cancer Maternal Grandmother   . Kidney cancer Paternal Uncle   . Prostate cancer Paternal Grandfather     Social History:  reports that he has been smoking.  He does not have any smokeless tobacco history on file. He reports that he does not drink alcohol or use illicit drugs.  ROS: UROLOGY Frequent Urination?: No Hard to postpone urination?: Yes Burning/pain with urination?: No Get up at night to urinate?: Yes Leakage of urine?: No Urine stream starts and stops?: No Trouble starting stream?: No Do you have to strain to urinate?: No Blood in urine?: No Urinary tract infection?: No Sexually transmitted disease?: No Injury to kidneys or bladder?: No Painful intercourse?: No Weak stream?: No Erection problems?: No Penile pain?: No  Gastrointestinal Nausea?: No Vomiting?: No Indigestion/heartburn?:  No Diarrhea?: Yes Constipation?: No  Constitutional Fever: No Night sweats?: No Weight loss?: No Fatigue?: Yes  Skin Skin rash/lesions?: No Itching?: Yes  Eyes Blurred vision?: Yes Double vision?: No  Ears/Nose/Throat Sore throat?: No Sinus problems?: No  Hematologic/Lymphatic Swollen glands?: No Easy bruising?: No  Cardiovascular Leg swelling?: Yes Chest pain?: Yes  Respiratory Cough?: Yes Shortness of breath?: No  Endocrine Excessive thirst?: Yes  Musculoskeletal Back pain?: No Joint pain?: Yes  Neurological Headaches?: No Dizziness?: No  Psychologic Depression?:  Yes Anxiety?: Yes  Physical Exam: BP 131/89 mmHg  Pulse 92  Resp 16  Ht 5\' 9"  (1.753 m)  Wt 240 lb 12.8 oz (109.226 kg)  BMI 35.54 kg/m2  Constitutional:  Alert and oriented, No acute distress. HEENT: [place] AT, moist mucus membranes.  Trachea midline, no masses. Cardiovascular: No clubbing, cyanosis, or edema. Respiratory: Normal respiratory effort, no increased work of breathing. GI: Abdomen is soft, nontender, obese, nondistended, no abdominal masses GU: No CVA tenderness.  Circumcised phallus, patent urinary meatus, testicles descended bilaterally normal in size without palpable masses or tenderness DRE: Prostate normal in size and smooth, nontender, no nodules Skin: No rashes, bruises or suspicious lesions. Lymph: No cervical or inguinal adenopathy. Neurologic: Grossly intact, no focal deficits, moving all 4 extremities. Psychiatric: Normal mood and affect.  Laboratory Data:   Urinalysis No results found for: COLORURINE, APPEARANCEUR, LABSPEC, PHURINE, GLUCOSEU, HGBUR, BILIRUBINUR, KETONESUR, PROTEINUR, UROBILINOGEN, NITRITE, LEUKOCYTESUR  Pertinent Imaging:   Assessment & Plan:   1.  Low testosterone-   Previous testosterone to 211. Patient reports symptoms of fatigue, low libido and weight gain over the last year. Patient will return next week for early a.m. blood draw for repeat testosterone, FSH LH and prolactin levels. I briefly discussed testosterone replacement therapy and its associated risks and necessary monitoring parameters. We will review these further as needed at his follow-up visit.  2. Fatigue- as above. Patient was also noted to have low vitamin D levels by his primary care provider and was started on supplementation.  Patient states that he has not noticed an improvement in his energy level since starting supplementation.  There are no diagnoses linked to this encounter.  Return for lab visit in 1 week;  2 week f/u with me 2 weeks to review labs.  These  notes generated with voice recognition software. I apologize for typographical errors.  Herbert Moors, [facility] [address], [place], [place] [address] [phone]

## 2015-06-28 ENCOUNTER — Other Ambulatory Visit: Payer: Managed Care, Other (non HMO)

## 2015-06-29 ENCOUNTER — Other Ambulatory Visit: Payer: Managed Care, Other (non HMO)

## 2015-06-29 DIAGNOSIS — R5383 Other fatigue: Secondary | ICD-10-CM

## 2015-06-29 DIAGNOSIS — R7989 Other specified abnormal findings of blood chemistry: Secondary | ICD-10-CM

## 2015-06-30 LAB — PROLACTIN: Prolactin: 5.5 ng/mL (ref 4.0–15.2)

## 2015-06-30 LAB — FSH/LH
FSH: 6 m[IU]/mL (ref 1.5–12.4)
LH: 3.7 m[IU]/mL (ref 1.7–8.6)

## 2015-06-30 LAB — TESTOSTERONE: TESTOSTERONE: 318 ng/dL — AB (ref 348–1197)

## 2015-07-05 ENCOUNTER — Encounter: Payer: Self-pay | Admitting: Obstetrics and Gynecology

## 2015-07-05 ENCOUNTER — Ambulatory Visit: Payer: Managed Care, Other (non HMO) | Admitting: Obstetrics and Gynecology

## 2015-08-07 ENCOUNTER — Ambulatory Visit (INDEPENDENT_AMBULATORY_CARE_PROVIDER_SITE_OTHER): Payer: Managed Care, Other (non HMO) | Admitting: Obstetrics and Gynecology

## 2015-08-07 ENCOUNTER — Encounter: Payer: Self-pay | Admitting: Obstetrics and Gynecology

## 2015-08-07 VITALS — BP 125/80 | HR 94 | Resp 16 | Ht 69.0 in | Wt 244.8 lb

## 2015-08-07 DIAGNOSIS — E291 Testicular hypofunction: Secondary | ICD-10-CM

## 2015-08-07 DIAGNOSIS — R5383 Other fatigue: Secondary | ICD-10-CM | POA: Diagnosis not present

## 2015-08-07 NOTE — Progress Notes (Signed)
9:58 AM   Ryan Hatfield [birth year] [id number]  Referring provider: Jearld Fenton, NP [address] [place], [place] [address]  Chief Complaint  Patient presents with  . Hypogonadism  . Follow-up    Labs    HPI: Patient is a 50yo male presenting today as a referral from his PCP for low testosterone. Patient reports symptoms of fatigue, low libido and weight gain of 40lbs over the last year.  He states that he used to be a very heavy alcohol drinker but stopped drinking approximately a year ago and has not noticed any improvement in his symptoms. He does report that he has decreased his physical activity due to fatigue. He does not feel that he has significantly changed his diet recenlty but reports that he does routinely eat fried foods but very rarely eats fast food. He denies increased symptoms of depression or irritability but states that he has felt more "nervous and jumpy" recently.  No urinary symptoms. No exacerbating or alleviating factors.  [place]: grandfather- prostate cancer  06/11/15 Testosterone 211.83 PSA 1.09 Triglycerides 257 elevated  05/29/15 TSH 4.43 Hct 44.8 normal Vit D 14.39 low- on supplement Vit B12 255 normal  Interval History: Patient presents today for follow-up for low testosterone and to review most recent labs. He reports that his symptoms are unchanged. He is having a lot of chronic pain issues in his joints and soles of his feet. He has also noticed some increased swelling of his lower extremities worsening throughout the day. He is wearing compression socks.  He reports that he is taking cod level or L but is not taking any other fish oils as recommended by his primary care provider. He has not reduced the fried foods in his diet and states that he does typically eat fried foods frequently.  He does admit to not exercising as frequently as what he wants did due to increased pain in his joints and feet.  Repeat testosterone level was 311.  FSH/LH and prolactin levels were within normal limits.   PMH: Past Medical History  Diagnosis Date  . Anxiety and depression 05/29/2015  . Alcohol abuse, in remission 05/29/2015  . Chest pain 01/15/2013  . Cannot sleep 02/03/2013  . Arthritis, degenerative 02/03/2013    Overview:  Of right middle finger MCP joint     Surgical History: Past Surgical History  Procedure Laterality Date  . Septoplasty    . Tonsillectomy      Home Medications:    Medication List       This list is accurate as of: 08/07/15  9:58 AM.  Always use your most recent med list.               FISH OIL BURP-LESS PO  Take 1 capsule by mouth daily.     ibuprofen 100 MG tablet  Commonly known as:  ADVIL,MOTRIN  Take 100 mg by mouth every 6 (six) hours as needed for fever.     Vitamin D (Ergocalciferol) 50000 units Caps capsule  Commonly known as:  DRISDOL  Take 1 capsule (50,000 Units total) by mouth every 7 (seven) days.        Allergies:  Allergies  Allergen Reactions  . Paroxetine     REACTION: paranoia, confusion  . Sertraline Hcl     REACTION: paranoia, confusion    Family History: Family History  Problem Relation Age of Onset  . Heart disease Mother   . Arthritis Mother   . Heart disease  Father   . Diabetes Brother   . Stroke Brother   . Cancer Maternal Grandmother   . Kidney cancer Paternal Uncle   . Prostate cancer Paternal Grandfather     Social History:  reports that he has been smoking.  He does not have any smokeless tobacco history on file. He reports that he does not drink alcohol or use illicit drugs.  ROS: UROLOGY Frequent Urination?: No Hard to postpone urination?: Yes Burning/pain with urination?: No Get up at night to urinate?: No Leakage of urine?: No Urine stream starts and stops?: No Trouble starting stream?: No Do you have to strain to urinate?: No Blood in urine?: No Urinary tract infection?: No Sexually transmitted disease?: No Injury to kidneys or  bladder?: No Painful intercourse?: No Weak stream?: No Erection problems?: No Penile pain?: No  Gastrointestinal Nausea?: No Vomiting?: No Indigestion/heartburn?: No Diarrhea?: No Constipation?: No  Constitutional Fever: No Night sweats?: No Weight loss?: No Fatigue?: No  Skin Skin rash/lesions?: No Itching?: No  Eyes Blurred vision?: No Double vision?: No  Ears/Nose/Throat Sore throat?: No Sinus problems?: No  Hematologic/Lymphatic Swollen glands?: No Easy bruising?: No  Cardiovascular Leg swelling?: Yes Chest pain?: No  Respiratory Cough?: No Shortness of breath?: No  Endocrine Excessive thirst?: No  Musculoskeletal Back pain?: Yes Joint pain?: Yes  Neurological Headaches?: No Dizziness?: No  Psychologic Depression?: No Anxiety?: No  Physical Exam: BP 125/80 mmHg  Pulse 94  Resp 16  Ht 5\' 9"  (1.753 m)  Wt 244 lb 12.8 oz (111.041 kg)  BMI 36.13 kg/m2  Constitutional:  Alert and oriented, No acute distress. HEENT: [place] AT, moist mucus membranes.  Trachea midline, no masses. Cardiovascular: No clubbing, cyanosis, or edema. Respiratory: Normal respiratory effort, no increased work of breathing. Skin: No rashes, bruises or suspicious lesions. Neurologic: Grossly intact, no focal deficits, moving all 4 extremities. Psychiatric: Normal mood and affect.  Laboratory Data: Recent Results (from the past 2160 hour(s))  CBC     Status: Abnormal   Collection Time: 05/29/15 10:00 AM  Result Value Ref Range   WBC 11.3 (H) 4.0 - 10.5 K/uL   RBC 4.97 4.22 - 5.81 Mil/uL   Platelets 272.0 150.0 - 400.0 K/uL   Hemoglobin 14.9 13.0 - 17.0 g/dL   HCT 44.8 39.0 - 52.0 %   MCV 90.2 78.0 - 100.0 fl   MCHC 33.4 30.0 - 36.0 g/dL   RDW 13.7 11.5 - 15.5 %  Comprehensive metabolic panel     Status: Abnormal   Collection Time: 05/29/15 10:00 AM  Result Value Ref Range   Sodium 139 135 - 145 mEq/L   Potassium 3.8 3.5 - 5.1 mEq/L   Chloride 102 96 - 112 mEq/L    CO2 29 19 - 32 mEq/L   Glucose, Bld 113 (H) 70 - 99 mg/dL   BUN 15 6 - 23 mg/dL   Creatinine, Ser 1.02 0.40 - 1.50 mg/dL   Total Bilirubin 0.6 0.2 - 1.2 mg/dL   Alkaline Phosphatase 81 39 - 117 U/L   AST 18 0 - 37 U/L   ALT 14 0 - 53 U/L   Total Protein 7.2 6.0 - 8.3 g/dL   Albumin 4.3 3.5 - 5.2 g/dL   Calcium 9.5 8.4 - 10.5 mg/dL   GFR 81.59 >60.00 mL/min  Hemoglobin A1c     Status: None   Collection Time: 05/29/15 10:00 AM  Result Value Ref Range   Hgb A1c MFr Bld 5.7 4.6 - 6.5 %  Comment: Glycemic Control Guidelines for People with Diabetes:Non Diabetic:  <6%Goal of Therapy: <7%Additional Action Suggested:  >8%   Brain natriuretic peptide     Status: None   Collection Time: 05/29/15 10:00 AM  Result Value Ref Range   Pro B Natriuretic peptide (BNP) 14.0 0.0 - 100.0 pg/mL  TSH     Status: None   Collection Time: 05/29/15 10:00 AM  Result Value Ref Range   TSH 4.43 0.35 - 4.50 uIU/mL  VITAMIN D 25 Hydroxy (Vit-D Deficiency, Fractures)     Status: Abnormal   Collection Time: 05/29/15 10:00 AM  Result Value Ref Range   VITD 14.39 (L) 30.00 - 100.00 ng/mL  Vitamin B12     Status: None   Collection Time: 05/29/15 10:00 AM  Result Value Ref Range   Vitamin B-12 255 211 - 911 pg/mL  Lipid panel     Status: Abnormal   Collection Time: 06/11/15  8:46 AM  Result Value Ref Range   Cholesterol 192 0 - 200 mg/dL    Comment: ATP III Classification       Desirable:  < 200 mg/dL               Borderline High:  200 - 239 mg/dL          High:  > = 240 mg/dL   Triglycerides 257.0 (H) 0.0 - 149.0 mg/dL    Comment: Normal:  <150 mg/dLBorderline High:  150 - 199 mg/dL   HDL 36.60 (L) >39.00 mg/dL   VLDL 51.4 (H) 0.0 - 40.0 mg/dL   Total CHOL/HDL Ratio 5     Comment:                Men          Women1/2 Average Risk     3.4          3.3Average Risk          5.0          4.42X Average Risk          9.6          7.13X Average Risk          15.0          11.0                       NonHDL  155.15     Comment: NOTE:  Non-HDL goal should be 30 mg/dL higher than patient's LDL goal (i.e. LDL goal of < 70 mg/dL, would have non-HDL goal of < 100 mg/dL)  PSA     Status: None   Collection Time: 06/11/15  8:46 AM  Result Value Ref Range   PSA 1.09 0.10 - 4.00 ng/mL  HIV antibody     Status: None   Collection Time: 06/11/15  8:46 AM  Result Value Ref Range   HIV 1&2 Ab, 4th Generation NONREACTIVE NONREACTIVE    Comment:   HIV-1 antigen and HIV-1/HIV-2 antibodies were not detected.  There is no laboratory evidence of HIV infection.   HIV-1/2 Antibody Diff        Not indicated. HIV-1 RNA, Qual TMA          Not indicated.     PLEASE NOTE: This information has been disclosed to you from records whose confidentiality may be protected by state law. If your state requires such protection, then the state law prohibits you from making any further disclosure of the  information without the specific written consent of the person to whom it pertains, or as otherwise permitted by law. A general authorization for the release of medical or other information is NOT sufficient for this purpose.   The performance of this assay has not been clinically validated in patients less than 52 years old.   For additional information please refer to http://education.[facility].com/faq/FAQ106.  (This link is being provided for informational/educational purposes only.)     Testosterone     Status: Abnormal   Collection Time: 06/11/15  8:46 AM  Result Value Ref Range   Testosterone 211.83 (L) 300.00 - 890.00 ng/dL  LDL cholesterol, direct     Status: None   Collection Time: 06/11/15  8:46 AM  Result Value Ref Range   Direct LDL 123.0 mg/dL    Comment: Optimal:  <100 mg/dLNear or Above Optimal:  100-129 mg/dLBorderline High:  130-159 mg/dLHigh:  160-189 mg/dLVery High:  >190 mg/dL  Hepatitis C antibody     Status: None   Collection Time: 06/11/15  8:46 AM  Result Value Ref Range   HCV Ab  NEGATIVE NEGATIVE  FSH/LH     Status: None   Collection Time: 06/29/15  8:15 AM  Result Value Ref Range   LH 3.7 1.7 - 8.6 mIU/mL   FSH 6.0 1.5 - 12.4 mIU/mL  Testosterone     Status: Abnormal   Collection Time: 06/29/15  8:15 AM  Result Value Ref Range   Testosterone 318 (L) 348 - 1197 ng/dL   Comment, Testosterone Comment     Comment: Adult male reference interval is based on a population of lean males up to 52 years old.   Prolactin     Status: None   Collection Time: 06/29/15  8:15 AM  Result Value Ref Range   Prolactin 5.5 4.0 - 15.2 ng/mL    Urinalysis No results found for: COLORURINE, APPEARANCEUR, LABSPEC, PHURINE, GLUCOSEU, HGBUR, BILIRUBINUR, KETONESUR, PROTEINUR, UROBILINOGEN, NITRITE, LEUKOCYTESUR  Pertinent Imaging:   Assessment & Plan:   1.  Low testosterone-   Previous testosterone to 211. Patient reports symptoms of fatigue, low libido and weight gain over the last year. Repeat testosterone level was 311. FSH/LH and prolactin levels were within normal limits. I advised patient of the cardiac risks involved with testosterone replacement therapy. I do not feel comfortable prescribing the medication location to him at this time. I would like him to work on improving his cholesterol levels and weight loss.  He will follow heart healthy diet recomendations and f/u in 1 month.  We discussed the symptoms of hypogonadism of decreased libido, fatigue, depression, decreased physical performance as well as the normal decline in testosterone that is age-related as well as rare and potentially dangerous causes such as pituitary tumor, gonadal failure, or rare syndromes and role for work-up with labs including endocrine, hematologic, and PSA studies. We also discussed the role of testosterone replacement therapy if found to be low with attendant risks of therapy such as prostate enlargement (urinary retention), prostate cancer, hypercoagulable state (increased stroke, DVT),  infertility (decreased sperm production), increases in cholesterol and liver enzymes with need for periodic surveillance with labs and exams. I also directly mentioned the results of a randomized prospective trial of testosterone replacement which was stopped prematurely as patients in the replacement arm had higher all-cause mortality. We also discussed that contact of testosterone- continuing medications with children or females may lead to serious and permanent problems including precocious puberty, hirsutism, and stunted growth. I specifically stated  that I measure success only in terms of symptom improvement and not actual value, which is less important, and that we should strive for minimum possible dose to achieve this goal. After consideration of the risks and benefits of replacement therapy, the patient is interseted testosterone replacement. He understands that he will require periodic hematologic, endocrine, and prostate studies to monitor safety profile.   2. Fatigue- as above. Patient was also noted to have low vitamin D levels by his primary care provider and was started on supplementation.  Patient states that he has not noticed an improvement in his energy level since starting supplementation.  There are no diagnoses linked to this encounter.  Return for lab draw in 1 month f/u visit in 5-6 weeks.  These notes generated with voice recognition software. I apologize for typographical errors.  Herbert Moors, [facility] [address], [place], [place] [address] [phone]

## 2015-08-07 NOTE — Patient Instructions (Signed)
Heart-Healthy Eating Plan Many factors influence your heart health, including eating and exercise habits. Heart (coronary) risk increases with abnormal blood fat (lipid) levels. Heart-healthy meal planning includes limiting unhealthy fats, increasing healthy fats, and making other small dietary changes. This includes maintaining a healthy body weight to help keep lipid levels within a normal range. WHAT IS MY PLAN?  Your health care provider recommends that you:  Get no more than _________% of the total calories in your daily diet from fat.  Limit your intake of saturated fat to less than _________% of your total calories each day.  Limit the amount of cholesterol in your diet to less than _________ mg per day. WHAT TYPES OF FAT SHOULD I CHOOSE?  Choose healthy fats more often. Choose monounsaturated and polyunsaturated fats, such as olive oil and canola oil, flaxseeds, walnuts, almonds, and seeds.  Eat more omega-3 fats. Good choices include salmon, mackerel, sardines, tuna, flaxseed oil, and ground flaxseeds. Aim to eat fish at least two times each week.  Limit saturated fats. Saturated fats are primarily found in animal products, such as meats, butter, and cream. Plant sources of saturated fats include palm oil, palm kernel oil, and coconut oil.  Avoid foods with partially hydrogenated oils in them. These contain trans fats. Examples of foods that contain trans fats are stick margarine, some tub margarines, cookies, crackers, and other baked goods. WHAT GENERAL GUIDELINES DO I NEED TO FOLLOW?  Check food labels carefully to identify foods with trans fats or high amounts of saturated fat.  Fill one half of your plate with vegetables and green salads. Eat 4-5 servings of vegetables per day. A serving of vegetables equals 1 cup of raw leafy vegetables,  cup of raw or cooked cut-up vegetables, or  cup of vegetable juice.  Fill one fourth of your plate with whole grains. Look for the word  "whole" as the first word in the ingredient list.  Fill one fourth of your plate with lean protein foods.  Eat 4-5 servings of fruit per day. A serving of fruit equals one medium whole fruit,  cup of dried fruit,  cup of fresh, frozen, or canned fruit, or  cup of 100% fruit juice.  Eat more foods that contain soluble fiber. Examples of foods that contain this type of fiber are apples, broccoli, carrots, beans, peas, and barley. Aim to get 20-30 g of fiber per day.  Eat more home-cooked food and less restaurant, buffet, and fast food.  Limit or avoid alcohol.  Limit foods that are high in starch and sugar.  Avoid fried foods.  Cook foods by using methods other than frying. Baking, boiling, grilling, and broiling are all great options. Other fat-reducing suggestions include:  Removing the skin from poultry.  Removing all visible fats from meats.  Skimming the fat off of stews, soups, and gravies before serving them.  Steaming vegetables in water or broth.  Lose weight if you are overweight. Losing just 5-10% of your initial body weight can help your overall health and prevent diseases such as diabetes and heart disease.  Increase your consumption of nuts, legumes, and seeds to 4-5 servings per week. One serving of dried beans or legumes equals  cup after being cooked, one serving of nuts equals 1 ounces, and one serving of seeds equals  ounce or 1 tablespoon.  You may need to monitor your salt (sodium) intake, especially if you have high blood pressure. Talk with your health care provider or dietitian to get  more information about reducing sodium. °WHAT FOODS CAN I EAT? °Grains °Breads, including French, white, pita, wheat, raisin, rye, oatmeal, and Italian. Tortillas that are neither fried nor made with lard or trans fat. Low-fat rolls, including hotdog and hamburger buns and English muffins. Biscuits. Muffins. Waffles. Pancakes. Light popcorn. Whole-grain cereals. Flatbread. Melba  toast. Pretzels. Breadsticks. Rusks. Low-fat snacks and crackers, including oyster, saltine, matzo, graham, animal, and rye. Rice and pasta, including brown rice and those that are made with whole wheat. °Vegetables °All vegetables. °Fruits °All fruits, but limit coconut. °Meats and Other Protein Sources °Lean, well-trimmed beef, veal, pork, and lamb. Chicken and turkey without skin. All fish and shellfish. Wild duck, rabbit, pheasant, and venison. Egg whites or low-cholesterol egg substitutes. Dried beans, peas, lentils, and tofu. Seeds and most nuts. °Dairy °Low-fat or nonfat cheeses, including ricotta, string, and mozzarella. Skim or 1% milk that is liquid, powdered, or evaporated. Buttermilk that is made with low-fat milk. Nonfat or low-fat yogurt. °Beverages °Mineral water. Diet carbonated beverages. °Sweets and Desserts °Sherbets and fruit ices. Honey, jam, marmalade, jelly, and syrups. Meringues and gelatins. Pure sugar candy, such as hard candy, jelly beans, gumdrops, mints, marshmallows, and small amounts of dark chocolate. Angel food cake. °Eat all sweets and desserts in moderation. °Fats and Oils °Nonhydrogenated (trans-free) margarines. Vegetable oils, including soybean, sesame, sunflower, olive, peanut, safflower, corn, canola, and cottonseed. Salad dressings or mayonnaise that are made with a vegetable oil. Limit added fats and oils that you use for cooking, baking, salads, and as spreads. °Other °Cocoa powder. Coffee and tea. All seasonings and condiments. °The items listed above may not be a complete list of recommended foods or beverages. Contact your dietitian for more options. °WHAT FOODS ARE NOT RECOMMENDED? °Grains °Breads that are made with saturated or trans fats, oils, or whole milk. Croissants. Butter rolls. Cheese breads. Sweet rolls. Donuts. Buttered popcorn. Chow mein noodles. High-fat crackers, such as cheese or butter crackers. °Meats and Other Protein Sources °Fatty meats, such as  hotdogs, short ribs, sausage, spareribs, bacon, ribeye roast or steak, and mutton. High-fat deli meats, such as salami and bologna. Caviar. Domestic duck and goose. Organ meats, such as kidney, liver, sweetbreads, brains, gizzard, chitterlings, and heart. °Dairy °Cream, sour cream, cream cheese, and creamed cottage cheese. Whole milk cheeses, including blue (bleu), Monterey Jack, Brie, [place], American, Havarti, Swiss, cheddar, Camembert, and [place].  Whole or 2% milk that is liquid, evaporated, or condensed. Whole buttermilk. Cream sauce or high-fat cheese sauce. Yogurt that is made from whole milk. °Beverages °Regular sodas and drinks with added sugar. °Sweets and Desserts °Frosting. Pudding. Cookies. Cakes other than angel food cake. Candy that has milk chocolate or white chocolate, hydrogenated fat, butter, coconut, or unknown ingredients. Buttered syrups. Full-fat ice cream or ice cream drinks. °Fats and Oils °Gravy that has suet, meat fat, or shortening. Cocoa butter, hydrogenated oils, palm oil, coconut oil, palm kernel oil. These can often be found in baked products, candy, fried foods, nondairy creamers, and whipped toppings. Solid fats and shortenings, including bacon fat, salt pork, lard, and butter. Nondairy cream substitutes, such as coffee creamers and sour cream substitutes. Salad dressings that are made of unknown oils, cheese, or sour cream. °The items listed above may not be a complete list of foods and beverages to avoid. Contact your dietitian for more information. °  °This information is not intended to replace advice given to you by your health care provider. Make sure you discuss any questions you have with your health   care provider.   Document Released: 04/01/2008 Document Revised: 07/14/2014 Document Reviewed: 12/15/2013 Elsevier Interactive Patient Education 2016 Elsevier Inc[employer] Fat and Cholesterol Restricted Diet High levels of fat and cholesterol in your blood may lead to various  health problems, such as diseases of the heart, blood vessels, gallbladder, liver, and pancreas. Fats are concentrated sources of energy that come in various forms. Certain types of fat, including saturated fat, may be harmful in excess. Cholesterol is a substance needed by your body in small amounts. Your body makes all the cholesterol it needs. Excess cholesterol comes from the food you eat. When you have high levels of cholesterol and saturated fat in your blood, health problems can develop because the excess fat and cholesterol will gather along the walls of your blood vessels, causing them to narrow. Choosing the right foods will help you control your intake of fat and cholesterol. This will help keep the levels of these substances in your blood within normal limits and reduce your risk of disease. WHAT IS MY PLAN? Your health care provider recommends that you:  Get no more than __________ % of the total calories in your daily diet from fat.  Limit your intake of saturated fat to less than ______% of your total calories each day.  Limit the amount of cholesterol in your diet to less than _________mg per day. WHAT TYPES OF FAT SHOULD I CHOOSE?  Choose healthy fats more often. Choose monounsaturated and polyunsaturated fats, such as olive and canola oil, flaxseeds, walnuts, almonds, and seeds.  Eat more omega-3 fats. Good choices include salmon, mackerel, sardines, tuna, flaxseed oil, and ground flaxseeds. Aim to eat fish at least two times a week.  Limit saturated fats. Saturated fats are primarily found in animal products, such as meats, butter, and cream. Plant sources of saturated fats include palm oil, palm kernel oil, and coconut oil.  Avoid foods with partially hydrogenated oils in them. These contain trans fats. Examples of foods that contain trans fats are stick margarine, some tub margarines, cookies, crackers, and other baked goods. WHAT GENERAL GUIDELINES DO I NEED TO FOLLOW? These  guidelines for healthy eating will help you control your intake of fat and cholesterol:  Check food labels carefully to identify foods with trans fats or high amounts of saturated fat.  Fill one half of your plate with vegetables and green salads.  Fill one fourth of your plate with whole grains. Look for the word "whole" as the first word in the ingredient list.  Fill one fourth of your plate with lean protein foods.  Limit fruit to two servings a day. Choose fruit instead of juice.  Eat more foods that contain soluble fiber. Examples of foods that contain this type of fiber are apples, broccoli, carrots, beans, peas, and barley. Aim to get 20-30 g of fiber per day.  Eat more home-cooked food and less restaurant, buffet, and fast food.  Limit or avoid alcohol.  Limit foods high in starch and sugar.  Limit fried foods.  Cook foods using methods other than frying. Baking, boiling, grilling, and broiling are all great options.  Lose weight if you are overweight. Losing just 5-10% of your initial body weight can help your overall health and prevent diseases such as diabetes and heart disease. WHAT FOODS CAN I EAT? Grains Whole grains, such as whole wheat or whole grain breads, crackers, cereals, and pasta. Unsweetened oatmeal, bulgur, barley, quinoa, or brown rice. Corn or whole wheat flour tortillas. Vegetables Fresh or  frozen vegetables (raw, steamed, roasted, or grilled). Green salads. Fruits All fresh, canned (in natural juice), or frozen fruits. Meat and Other Protein Products Ground beef (85% or leaner), grass-fed beef, or beef trimmed of fat. Skinless chicken or [place]. Ground chicken or [place]. Pork trimmed of fat. All fish and seafood. Eggs. Dried beans, peas, or lentils. Unsalted nuts or seeds. Unsalted canned or dry beans. Dairy Low-fat dairy products, such as skim or 1% milk, 2% or reduced-fat cheeses, low-fat ricotta or cottage cheese, or plain low-fat yogurt. Fats and  Oils Tub margarines without trans fats. Light or reduced-fat mayonnaise and salad dressings. Avocado. Olive, canola, sesame, or safflower oils. Natural peanut or almond butter (choose ones without added sugar and oil). The items listed above may not be a complete list of recommended foods or beverages. Contact your dietitian for more options. WHAT FOODS ARE NOT RECOMMENDED? Grains White bread. White pasta. White rice. Cornbread. Bagels, pastries, and croissants. Crackers that contain trans fat. Vegetables White potatoes. Corn. Creamed or fried vegetables. Vegetables in a cheese sauce. Fruits Dried fruits. Canned fruit in light or heavy syrup. Fruit juice. Meat and Other Protein Products Fatty cuts of meat. Ribs, chicken wings, bacon, sausage, bologna, salami, chitterlings, fatback, hot dogs, bratwurst, and packaged luncheon meats. Liver and organ meats. Dairy Whole or 2% milk, cream, half-and-half, and cream cheese. Whole milk cheeses. Whole-fat or sweetened yogurt. Full-fat cheeses. Nondairy creamers and whipped toppings. Processed cheese, cheese spreads, or cheese curds. Sweets and Desserts Corn syrup, sugars, honey, and molasses. Candy. Jam and jelly. Syrup. Sweetened cereals. Cookies, pies, cakes, donuts, muffins, and ice cream. Fats and Oils Butter, stick margarine, lard, shortening, ghee, or bacon fat. Coconut, palm kernel, or palm oils. Beverages Alcohol. Sweetened drinks (such as sodas, lemonade, and fruit drinks or punches). The items listed above may not be a complete list of foods and beverages to avoid. Contact your dietitian for more information.   This information is not intended to replace advice given to you by your health care provider. Make sure you discuss any questions you have with your health care provider.   Document Released: 06/23/2005 Document Revised: 07/14/2014 Document Reviewed: 09/21/2013 Elsevier Interactive Patient Education [employer].

## 2015-08-13 ENCOUNTER — Ambulatory Visit (INDEPENDENT_AMBULATORY_CARE_PROVIDER_SITE_OTHER): Payer: Managed Care, Other (non HMO) | Admitting: Internal Medicine

## 2015-08-13 ENCOUNTER — Encounter: Payer: Self-pay | Admitting: Internal Medicine

## 2015-08-13 VITALS — BP 134/80 | HR 88 | Temp 98.6°F | Wt 241.0 lb

## 2015-08-13 DIAGNOSIS — M722 Plantar fascial fibromatosis: Secondary | ICD-10-CM | POA: Diagnosis not present

## 2015-08-13 DIAGNOSIS — M79672 Pain in left foot: Secondary | ICD-10-CM | POA: Diagnosis not present

## 2015-08-13 DIAGNOSIS — M25531 Pain in right wrist: Secondary | ICD-10-CM | POA: Diagnosis not present

## 2015-08-13 DIAGNOSIS — R202 Paresthesia of skin: Secondary | ICD-10-CM | POA: Diagnosis not present

## 2015-08-13 DIAGNOSIS — M25532 Pain in left wrist: Secondary | ICD-10-CM | POA: Diagnosis not present

## 2015-08-13 LAB — URIC ACID: Uric Acid, Serum: 7.6 mg/dL (ref 4.0–7.8)

## 2015-08-13 LAB — SEDIMENTATION RATE: Sed Rate: 9 mm/hr (ref 0–22)

## 2015-08-13 MED ORDER — MELOXICAM 15 MG PO TABS: 15.0000 mg | ORAL_TABLET | Freq: Every day | ORAL | Status: DC

## 2015-08-13 NOTE — Patient Instructions (Signed)
Heel Spur  A heel spur is a bony growth that forms on the bottom of your heel bone (calcaneus). Heel spurs are common and do not always cause pain. However, heel spurs often cause inflammation in the strong band of tissue that runs underneath the bone of your foot (plantar fascia). When this happens, you may feel pain on the bottom of your foot, near your heel.   CAUSES   The cause of heel spurs is not completely understood. They may be caused by pressure on the heel. Or, they may stem from the muscle attachments (tendons) near the spur pulling on the heel.   RISK FACTORS  You may be at risk for a heel spur if you:  · Are older than 40.  · Are overweight.  · Have wear and tear arthritis (osteoarthritis).  · Have plantar fascia inflammation.  SIGNS AND SYMPTOMS   Some people have heel spurs but no symptoms. If you do have symptoms, they may include:   · Pain in the bottom of your heel.  · Pain that is worse when you first get out of bed.  · Pain that gets worse after walking or standing.  DIAGNOSIS   Your health care provider may diagnose a heel spur based on your symptoms and a physical exam. You may also have an X-ray of your foot to check for a bony growth coming from the calcaneus.   TREATMENT  Treatment aims to relieve the pain from the heel spur. This may include:  · Stretching exercises.  · Losing weight.  · Wearing specific shoes, inserts, or orthotics for comfort and support.  · Wearing splints at night to properly position your feet.  · Taking over-the-counter medicine to relieve pain.  · Being treated with high-intensity sound waves to break up the heel spur (extracorporeal shock wave therapy).  · Getting steroid injections in your heel to reduce swelling and ease pain.  · Having surgery if your heel spur causes long-term (chronic) pain.  HOME CARE INSTRUCTIONS   · Take medicines only as directed by your health care provider.  · Ask your health care provider if you should use ice or cold packs on the  painful areas of your heel or foot.  · Avoid activities that cause you pain until you recover or as directed by your health care provider.  · Stretch before exercising or being physically active.  · Wear supportive shoes that fit well as directed by your health care provider. You might need to buy new shoes. Wearing old shoes or shoes that do not fit correctly may not provide the support that you need.  · Lose weight if your health care provider thinks you should. This can relieve pressure on your foot that may be causing pain and discomfort.  SEEK MEDICAL CARE IF:   · Your pain continues or gets worse.     This information is not intended to replace advice given to you by your health care provider. Make sure you discuss any questions you have with your health care provider.     Document Released: 07/30/2005 Document Revised: 07/14/2014 Document Reviewed: 08/24/2013  Elsevier Interactive Patient Education ©2016 Elsevier Inc.

## 2015-08-13 NOTE — Progress Notes (Signed)
Subjective:    Patient ID: Ryan Hatfield, male    DOB: 03/14/1966, 50 y.o.   MRN: [id number]  HPI  Pt presents to the clinic today with c/o wrist, hand and foot pain. He has had bilateral hand pain for over 1 year.  He admits to numbness in his right hand with any activity and a constant tight feeling. He has noticed some swelling of the right hand and wrist but denies warmth or redness. He is right hand dominant.  The pain in left hand apparent constantly but radiates halfway up forearm and to tip of thumb with supination. He was seen for bilateral wrist pain 05/29/15. Xray of his left wrist showed radiocarpal degenerative change. The foot pain is in his left heel and arch. He denies any pain in his right foot. He has increased pain when arising from long periods of rest. He denies any injury and does not have a history of gout. He has tried Ibuprofen with some relief. He was prescribed a prednisone taper in November. He states this might have helped a little, but no great improvement. 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    Review of Systems  Past Medical History  Diagnosis Date  . Anxiety and depression 05/29/2015  . Alcohol abuse, in remission 05/29/2015  . Chest pain 01/15/2013  . Cannot sleep 02/03/2013  . Arthritis, degenerative 02/03/2013    Overview:  Of right middle finger MCP joint     Current Outpatient Prescriptions  Medication Sig Dispense Refill  . ibuprofen (ADVIL,MOTRIN) 100 MG tablet Take 100 mg by mouth every 6 (six) hours as needed for fever.    . Vitamin D, Ergocalciferol, (DRISDOL) 50000 UNITS CAPS capsule Take 1 capsule (50,000 Units total) by mouth every 7 (seven) days. 12 capsule 0  . meloxicam (MOBIC) 15 MG tablet Take 1 tablet (15 mg total) by mouth daily. 30 tablet 5  . Omega-3 Fatty Acids (FISH OIL BURP-LESS PO) Take 1 capsule by mouth daily. Reported on 08/13/2015     No current facility-administered medications for this  visit.    Allergies  Allergen Reactions  . Paroxetine     REACTION: paranoia, confusion  . Sertraline Hcl     REACTION: paranoia, confusion    Family History  Problem Relation Age of Onset  . Heart disease Mother   . Arthritis Mother   . Heart disease Father   . Diabetes Brother   . Stroke Brother   . Cancer Maternal Grandmother   . Kidney cancer Paternal Uncle   . Prostate cancer Paternal Grandfather     Social History   Social History  . Marital Status: Divorced    Spouse Name: N/A  . Number of Children: N/A  . Years of Education: N/A   Occupational History  . Not on file.   Social History Main Topics  . Smoking status: Current Every Day Smoker -- 1.00 packs/day for 40 years  . Smokeless tobacco: Not on file  . Alcohol Use: No     Comment: last drink x 3 days ago  . Drug Use: No  . Sexual Activity: Yes   Other Topics Concern  . Not on file   Social History Narrative     Constitutional: Denies fever, malaise, fatigue, headache or abrupt weight changes.  Respiratory: Denies difficulty breathing or shortness of breath. Cardiovascular: Denies chest pain, chest tightness, palpitations.  Musculoskeletal: Pt reports joint pain. Denies decrease in  range of motion, difficulty with gait, muscle pain or joint swelling.  Skin: Denies redness, rashes, lesions or ulcercations.  Neurological: Denies dizziness, difficulty with memory, difficulty with speech or problems with balance and coordination.    No other specific complaints in a complete review of systems (except as listed in HPI above).     Objective:   Physical Exam BP 134/80 mmHg  Pulse 88  Temp(Src) 98.6 F (37 C) (Oral)  Wt 241 lb (109.317 kg)  SpO2 96% Wt Readings from Last 3 Encounters:  08/13/15 241 lb (109.317 kg)  08/07/15 244 lb 12.8 oz (111.041 kg)  06/21/15 240 lb 12.8 oz (109.226 kg)    General: Appears his stated age, obese, in NAD. Skin: Warm, dry and intact. No rashes, lesions or  ulcerations noted other than a scar from a work accident on left forearm.  HEENT: Head: normal shape and size;  Neck:  Neck supple, trachea midline. No masses, lumps or thyromegaly present.  Cardiovascular: Normal rate and rhythm. S1,S2 noted.  No murmur, rubs or gallops noted.   Pulmonary/Chest: Normal effort and positive vesicular breath sounds. No respiratory distress. Right sided wheeze. No rales or ronchi noted.  Musculoskeletal: Shoulder and arm strength 5/5 BL. Right wrist strength 5/5 and full ROM. Left wrist decreased flexion and normal extension, strength 4/5. Left wrist pain radiates up arm and down thumb with supination. Pain to palpation of left radiocarpal joint. Pain to palpation of left heel and posterior arch.  Neurological: Alert and oriented. Positive Phalen's test. Negative Tinel's sign.     BMET    Component Value Date/Time   NA 139 05/29/2015 1000   K 3.8 05/29/2015 1000   CL 102 05/29/2015 1000   CO2 29 05/29/2015 1000   GLUCOSE 113* 05/29/2015 1000   BUN 15 05/29/2015 1000   CREATININE 1.02 05/29/2015 1000   CALCIUM 9.5 05/29/2015 1000   GFRNONAA >60 11/20/2014 2203   GFRAA >60 11/20/2014 2203    Lipid Panel     Component Value Date/Time   CHOL 192 06/11/2015 0846   TRIG 257.0* 06/11/2015 0846   HDL 36.60* 06/11/2015 0846   CHOLHDL 5 06/11/2015 0846   VLDL 51.4* 06/11/2015 0846    CBC    Component Value Date/Time   WBC 11.3* 05/29/2015 1000   RBC 4.97 05/29/2015 1000   HGB 14.9 05/29/2015 1000   HCT 44.8 05/29/2015 1000   PLT 272.0 05/29/2015 1000   MCV 90.2 05/29/2015 1000   MCH 34.0 11/20/2014 2203   MCHC 33.4 05/29/2015 1000   RDW 13.7 05/29/2015 1000    Hgb A1C Lab Results  Component Value Date   HGBA1C 5.7 05/29/2015          Assessment & Plan:  Left heel pain secondary to plantar fasciitis:  Meloxicam 49m PO daily Instructed on heel inserts, stretching and rolling water bottle or can under foot in the morning If persist,  consider referral to ortho  Left Radiocarpal Arthritis:  Will check uric acid, ESR, ANA and RF Meloxicam 173mPO daily  Right hand numbness, pain and swelling:  ?Carpel Tunnel Syndrome Refer to Neurologist for further evaluation and possible EMG testing  RTC as needed or if pain worsens.

## 2015-08-13 NOTE — Progress Notes (Signed)
Pre visit review using our clinic review tool, if applicable. No additional management support is needed unless otherwise documented below in the visit note. 

## 2015-08-14 LAB — RHEUMATOID FACTOR: Rhuematoid fact SerPl-aCnc: <10 IU/mL (ref <=14)

## 2015-08-14 LAB — ANA: Anti Nuclear Antibody(ANA): NEGATIVE

## 2015-09-06 ENCOUNTER — Other Ambulatory Visit: Payer: Managed Care, Other (non HMO)

## 2015-09-06 DIAGNOSIS — E785 Hyperlipidemia, unspecified: Secondary | ICD-10-CM

## 2015-09-07 LAB — LIPID PANEL
Chol/HDL Ratio: 6.2 ratio units — ABNORMAL HIGH (ref 0.0–5.0)
Cholesterol, Total: 168 mg/dL (ref 100–199)
HDL: 27 mg/dL — ABNORMAL LOW (ref >39)
LDL Calculated: 102 mg/dL — ABNORMAL HIGH (ref 0–99)
Triglycerides: 194 mg/dL — ABNORMAL HIGH (ref 0–149)
VLDL Cholesterol Cal: 39 mg/dL (ref 5–40)

## 2015-09-14 ENCOUNTER — Encounter: Payer: Self-pay | Admitting: Obstetrics and Gynecology

## 2015-09-14 ENCOUNTER — Telehealth: Payer: Self-pay | Admitting: Obstetrics and Gynecology

## 2015-09-14 ENCOUNTER — Ambulatory Visit (INDEPENDENT_AMBULATORY_CARE_PROVIDER_SITE_OTHER): Payer: Managed Care, Other (non HMO) | Admitting: Obstetrics and Gynecology

## 2015-09-14 VITALS — BP 139/80 | HR 88 | Resp 16 | Ht 69.0 in | Wt 244.0 lb

## 2015-09-14 DIAGNOSIS — E291 Testicular hypofunction: Secondary | ICD-10-CM

## 2015-09-14 NOTE — Telephone Encounter (Signed)
I faxed the referral over to [facility] Endocrinologist @ [phone] They will review and contact the patient with the appointment.  Phone# [phone]   Thanks, Sharyn Lull

## 2015-09-14 NOTE — Progress Notes (Signed)
9:17 AM   Ryan Hatfield [birth year] [id number]  Referring provider: Jearld Fenton, NP [address] [place], [place] [address]  Chief Complaint  Patient presents with  . Hypogonadism    HPI: Patient is a 50yo male presenting today as a referral from his PCP for low testosterone. Patient reports symptoms of fatigue, low libido and weight gain of 40lbs over the last year.  He states that he used to be a very heavy alcohol drinker but stopped drinking approximately a year ago and has not noticed any improvement in his symptoms. He does report that he has decreased his physical activity due to fatigue. He does not feel that he has significantly changed his diet recenlty but reports that he does routinely eat fried foods but very rarely eats fast food. He denies increased symptoms of depression or irritability but states that he has felt more "nervous and jumpy" recently.  No urinary symptoms. No exacerbating or alleviating factors.  [place]: grandfather- prostate cancer  06/11/15 Testosterone 211.83 PSA 1.09 Triglycerides 257 elevated  05/29/15 TSH 4.43 Hct 44.8 normal Vit D 14.39 low- on supplement Vit B12 255 normal  Interval History: Patient presents today for follow-up for low testosterone and to review most recent labs. He reports that his symptoms are unchanged. He is having a lot of chronic pain issues in his joints and soles of his feet. He has also noticed some increased swelling of his lower extremities worsening throughout the day. He is wearing compression socks.  He reports that he is taking cod level or L but is not taking any other fish oils as recommended by his primary care provider. He has not reduced the fried foods in his diet and states that he does typically eat fried foods frequently.  He does admit to not exercising as frequently as what he wants did due to increased pain in his joints and feet.  Repeat testosterone level was 311. FSH/LH and prolactin levels  were within normal limits.   Patient reports continued fatigue/tiredness throughout the day no matter how much he sleeps.   PMH: Past Medical History  Diagnosis Date  . Anxiety and depression 05/29/2015  . Alcohol abuse, in remission 05/29/2015  . Chest pain 01/15/2013  . Cannot sleep 02/03/2013  . Arthritis, degenerative 02/03/2013    Overview:  Of right middle finger MCP joint     Surgical History: Past Surgical History  Procedure Laterality Date  . Septoplasty    . Tonsillectomy      Home Medications:    Medication List       This list is accurate as of: 09/14/15  9:17 AM.  Always use your most recent med list.               aspirin 81 MG tablet  Take 81 mg by mouth daily.     FISH OIL BURP-LESS PO  Take 1 capsule by mouth daily. Reported on 08/13/2015     meloxicam 15 MG tablet  Commonly known as:  MOBIC  Take 1 tablet (15 mg total) by mouth daily.        Allergies:  Allergies  Allergen Reactions  . Paroxetine     REACTION: paranoia, confusion  . Sertraline Hcl     REACTION: paranoia, confusion    Family History: Family History  Problem Relation Age of Onset  . Heart disease Mother   . Arthritis Mother   . Heart disease Father   . Diabetes Brother   .  Stroke Brother   . Cancer Maternal Grandmother   . Kidney cancer Paternal Uncle   . Prostate cancer Paternal Grandfather     Social History:  reports that he has been smoking.  He does not have any smokeless tobacco history on file. He reports that he does not drink alcohol or use illicit drugs.  ROS: UROLOGY Frequent Urination?: No Hard to postpone urination?: No Burning/pain with urination?: No Get up at night to urinate?: No Leakage of urine?: No Urine stream starts and stops?: No Trouble starting stream?: No Do you have to strain to urinate?: No Blood in urine?: No Urinary tract infection?: No Sexually transmitted disease?: No Injury to kidneys or bladder?: No Painful intercourse?:  No Weak stream?: No Erection problems?: No Penile pain?: No  Gastrointestinal Nausea?: No Vomiting?: No Indigestion/heartburn?: No Diarrhea?: No Constipation?: No  Constitutional Fever: No Night sweats?: No Weight loss?: No Fatigue?: No  Skin Skin rash/lesions?: No Itching?: No  Eyes Blurred vision?: No Double vision?: No  Ears/Nose/Throat Sore throat?: No Sinus problems?: No  Hematologic/Lymphatic Swollen glands?: No Easy bruising?: No  Cardiovascular Leg swelling?: No Chest pain?: No  Respiratory Cough?: No Shortness of breath?: No  Endocrine Excessive thirst?: No  Musculoskeletal Back pain?: No Joint pain?: No  Neurological Headaches?: No Dizziness?: No  Psychologic Depression?: No Anxiety?: No  Physical Exam: BP 139/80 mmHg  Pulse 88  Resp 16  Ht 5' 9"  (1.753 m)  Wt 244 lb (110.678 kg)  BMI 36.02 kg/m2  Constitutional:  Alert and oriented, No acute distress. HEENT: [place] AT, moist mucus membranes.  Trachea midline, no masses. Cardiovascular: No clubbing, cyanosis, or edema. Respiratory: Normal respiratory effort, no increased work of breathing. Skin: No rashes, bruises or suspicious lesions. Neurologic: Grossly intact, no focal deficits, moving all 4 extremities. Psychiatric: Normal mood and affect.  Laboratory Data: Recent Results (from the past 2160 hour(s))  FSH/LH     Status: None   Collection Time: 06/29/15  8:15 AM  Result Value Ref Range   LH 3.7 1.7 - 8.6 mIU/mL   FSH 6.0 1.5 - 12.4 mIU/mL  Testosterone     Status: Abnormal   Collection Time: 06/29/15  8:15 AM  Result Value Ref Range   Testosterone 318 (L) 348 - 1197 ng/dL   Comment, Testosterone Comment     Comment: Adult male reference interval is based on a population of lean males up to 52 years old.   Prolactin     Status: None   Collection Time: 06/29/15  8:15 AM  Result Value Ref Range   Prolactin 5.5 4.0 - 15.2 ng/mL  Uric acid     Status: None   Collection  Time: 08/13/15  9:47 AM  Result Value Ref Range   Uric Acid, Serum 7.6 4.0 - 7.8 mg/dL  Sedimentation Rate     Status: None   Collection Time: 08/13/15  9:47 AM  Result Value Ref Range   Sed Rate 9 0 - 22 mm/hr  Rheumatoid factor     Status: None   Collection Time: 08/13/15  9:47 AM  Result Value Ref Range   Rhuematoid fact SerPl-aCnc <10 <=14 IU/mL    Comment:                            Interpretive Table                     Low Positive: 15 - 41  IU/mL                     High Positive:  >= 42 IU/mL    In addition to the RF result, and clinical symptoms including joint  involvement, the 2010 ACR Classification Criteria for  scoring/diagnosing Rheumatoid Arthritis include the results of the  following tests:  CRP (67672), ESR (15010), and CCP (APCA) (09470).  www.rheumatology.org/practice/clinical/classification/ra/ra_2010.asp   ANA     Status: None   Collection Time: 08/13/15  9:47 AM  Result Value Ref Range   Anit Nuclear Antibody(ANA) NEG NEGATIVE  Lipid panel     Status: Abnormal   Collection Time: 09/06/15  8:31 AM  Result Value Ref Range   Cholesterol, Total 168 100 - 199 mg/dL   Triglycerides 194 (H) 0 - 149 mg/dL   HDL 27 (L) >39 mg/dL   VLDL Cholesterol Cal 39 5 - 40 mg/dL   LDL Calculated 102 (H) 0 - 99 mg/dL   Chol/HDL Ratio 6.2 (H) 0.0 - 5.0 ratio units    Comment:                                   T. Chol/HDL Ratio                                             Men  Women                               1/2 Avg.Risk  3.4    3.3                                   Avg.Risk  5.0    4.4                                2X Avg.Risk  9.6    7.1                                3X Avg.Risk 23.4   11.0     Urinalysis No results found for: COLORURINE, APPEARANCEUR, LABSPEC, PHURINE, GLUCOSEU, HGBUR, BILIRUBINUR, KETONESUR, PROTEINUR, UROBILINOGEN, NITRITE, LEUKOCYTESUR  Pertinent Imaging:   Assessment & Plan:   1.  Low testosterone-   Previous testosterone to 211.  Patient reports symptoms of fatigue, low libido and weight gain over the last year. Repeat testosterone level was 311. FSH/LH and prolactin levels were within normal limits. I advised patient of the cardiac risks involved with testosterone replacement therapy. I do not feel comfortable prescribing the medication location to him at this time.  - Follow up: Patient's lipid levels have improved though he has not lost any weight. He reports that he has been following a heart healthy diet as previously recommended. I would like to defer testosterone replacement at this time patient be seen by endocrinology and had a sleep study ordered by his primary care provider. Given his current symptoms I suspect it is more likely that he is suffering from sleep apnea and would fit from a CPAP machine.  2. Fatigue-  as above. Patient was also noted to have low vitamin D levels by his primary care provider and was started on supplementation.  Patient states that he has not noticed an improvement in his energy level since starting supplementation.  Patient wil have sleep study and see endocrine prior to follow up in 3 months with [facility].   There are no diagnoses linked to this encounter.  Return in about 3 months (around 12/15/2015) for with [facility].  These notes generated with voice recognition software. I apologize for typographical errors.  Herbert Moors, [facility] [address], [place], [place] [address] [phone]

## 2015-09-17 DIAGNOSIS — M79642 Pain in left hand: Secondary | ICD-10-CM

## 2015-09-17 DIAGNOSIS — M79641 Pain in right hand: Secondary | ICD-10-CM | POA: Insufficient documentation

## 2015-09-17 DIAGNOSIS — R200 Anesthesia of skin: Secondary | ICD-10-CM | POA: Insufficient documentation

## 2015-12-10 ENCOUNTER — Ambulatory Visit: Payer: Managed Care, Other (non HMO) | Admitting: Internal Medicine

## 2015-12-10 ENCOUNTER — Telehealth: Payer: Self-pay | Admitting: Internal Medicine

## 2015-12-10 DIAGNOSIS — Z0289 Encounter for other administrative examinations: Secondary | ICD-10-CM

## 2015-12-10 NOTE — Telephone Encounter (Signed)
Yes needs to follow up

## 2015-12-10 NOTE — Telephone Encounter (Signed)
Patient did not come for their scheduled appointment today for 6 month follow up Please let me know if the patient needs to be contacted immediately for follow up or if no follow up is necessary.   ° °

## 2015-12-11 ENCOUNTER — Encounter: Payer: Self-pay | Admitting: Internal Medicine

## 2015-12-11 NOTE — Telephone Encounter (Signed)
No voicemail Mailed letter

## 2015-12-18 ENCOUNTER — Telehealth: Payer: Self-pay | Admitting: Urology

## 2015-12-18 NOTE — Telephone Encounter (Signed)
[facility] has tried several times to reach the patient to schd an appt and he has not returned any of the calls. If he wants to still be seen they said they will need a new referral.   Sharyn Lull

## 2015-12-20 ENCOUNTER — Encounter: Payer: Self-pay | Admitting: Urology

## 2015-12-20 ENCOUNTER — Ambulatory Visit: Payer: Managed Care, Other (non HMO) | Admitting: Urology

## 2016-01-17 ENCOUNTER — Encounter: Payer: Self-pay | Admitting: Internal Medicine

## 2016-01-17 ENCOUNTER — Ambulatory Visit (INDEPENDENT_AMBULATORY_CARE_PROVIDER_SITE_OTHER): Payer: Managed Care, Other (non HMO) | Admitting: Internal Medicine

## 2016-01-17 VITALS — BP 128/84 | HR 86 | Temp 99.0°F | Wt 242.8 lb

## 2016-01-17 DIAGNOSIS — G5603 Carpal tunnel syndrome, bilateral upper limbs: Secondary | ICD-10-CM

## 2016-01-17 DIAGNOSIS — H6121 Impacted cerumen, right ear: Secondary | ICD-10-CM

## 2016-01-17 DIAGNOSIS — M722 Plantar fascial fibromatosis: Secondary | ICD-10-CM

## 2016-01-17 DIAGNOSIS — G5600 Carpal tunnel syndrome, unspecified upper limb: Secondary | ICD-10-CM | POA: Insufficient documentation

## 2016-01-17 HISTORY — DX: Plantar fascial fibromatosis: M72.2

## 2016-01-17 MED ORDER — MELOXICAM 15 MG PO TABS: 15.0000 mg | ORAL_TABLET | Freq: Every day | ORAL | Status: DC

## 2016-01-17 NOTE — Patient Instructions (Signed)
Cerumen Impaction The structures of the external ear canal secrete a waxy substance known as cerumen. Excess cerumen can build up in the ear canal, causing a condition known as cerumen impaction. Cerumen impaction can cause ear pain and disrupt the function of the ear. The rate of cerumen production differs for each individual. In certain individuals, the configuration of the ear canal may decrease his or her ability to naturally remove cerumen. CAUSES Cerumen impaction is caused by excessive cerumen production or buildup. RISK FACTORS  Frequent use of swabs to clean ears.  Having narrow ear canals.  Having eczema.  Being dehydrated. SIGNS AND SYMPTOMS  Diminished hearing.  Ear drainage.  Ear pain.  Ear itch. TREATMENT Treatment may involve:  Over-the-counter or prescription ear drops to soften the cerumen.  Removal of cerumen by a health care provider. This may be done with:  Irrigation with warm water. This is the most common method of removal.  Ear curettes and other instruments.  Surgery. This may be done in severe cases. HOME CARE INSTRUCTIONS  Take medicines only as directed by your health care provider.  Do not insert objects into the ear with the intent of cleaning the ear. PREVENTION  Do not insert objects into the ear, even with the intent of cleaning the ear. Removing cerumen as a part of normal hygiene is not necessary, and the use of swabs in the ear canal is not recommended.  Drink enough water to keep your urine clear or pale yellow.  Control your eczema if you have it. SEEK MEDICAL CARE IF:  You develop ear pain.  You develop bleeding from the ear.  The cerumen does not clear after you use ear drops as directed.   This information is not intended to replace advice given to you by your health care provider. Make sure you discuss any questions you have with your health care provider.   Document Released: 07/31/2004 Document Revised: 07/14/2014  Document Reviewed: 02/07/2015 Elsevier Interactive Patient Education 2016 [employer]vier Inc.  

## 2016-01-17 NOTE — Progress Notes (Signed)
Subjective:    Patient ID: Ryan Hatfield, male    DOB: Jan 21, 1966, 50 y.o.   MRN: [id number]  HPI  Pt presents to the clinic today for 6 month follow-up of left plantar fascitis and bilateral carpal tunnel syndrome.  He reports the pain in his left foot began to improve 3-4 days after starting Meloxicam.  He continues to have constant pain but it is not as severe as it was prior to starting the medication, and is currently at a 3-4/10 in severity.  The pain has moved to the outside of his foot, and occasionally causes him to limp.  He recently purchased compression socks, which have also relieved some of his pain. He had nerve conduction studies completed by a neurologist which demonstrated moderate carpal tunnel in the left forearm and severe in the right side.  The neurologist instructed him to wear wrist braces while sleeping for 30 days, which he has done without relief of symptoms.  He continues to have pain in both wrists, but reports some improvement on the left side with Meloxicam.  The pain is described as an ache, with constant symptoms on the right and intermittent on the left. He does have associated numbness and tingling in his fingers. He notes discomfort while driving and eating, and admits to dropping things more often than before.  He is considering surgery for relief of symptoms, and reports needing to schedule a follow-up appointment with the neurologist.  He also complains of pressure in his right ear that began this morning.  He denies discharge or tinnitus.  He has a history of sinus problems, and reports constant nasal congestion.  He denies sores throat, or redness or discharge of the eyes.   Review of Systems    Past Medical History  Diagnosis Date  . Anxiety and depression 05/29/2015  . Alcohol abuse, in remission 05/29/2015  . Chest pain 01/15/2013  . Cannot sleep 02/03/2013  . Arthritis, degenerative 02/03/2013    Overview:  Of right middle finger MCP joint      Current Outpatient Prescriptions  Medication Sig Dispense Refill  . aspirin 81 MG tablet Take 81 mg by mouth daily.    . meloxicam (MOBIC) 15 MG tablet Take 1 tablet (15 mg total) by mouth daily. 30 tablet 5  . Omega-3 Fatty Acids (FISH OIL BURP-LESS PO) Take 1 capsule by mouth daily. Reported on 08/13/2015     No current facility-administered medications for this visit.    Allergies  Allergen Reactions  . Paroxetine     REACTION: paranoia, confusion  . Sertraline Hcl     REACTION: paranoia, confusion    Family History  Problem Relation Age of Onset  . Heart disease Mother   . Arthritis Mother   . Heart disease Father   . Diabetes Brother   . Stroke Brother   . Cancer Maternal Grandmother   . Kidney cancer Paternal Uncle   . Prostate cancer Paternal Grandfather     Social History   Social History  . Marital Status: Divorced    Spouse Name: N/A  . Number of Children: N/A  . Years of Education: N/A   Occupational History  . Not on file.   Social History Main Topics  . Smoking status: Current Every Day Smoker -- 1.00 packs/day for 40 years  . Smokeless tobacco: Not on file  . Alcohol Use: No     Comment: last drink x 3 days ago  . Drug Use: No  .  Sexual Activity: Yes   Other Topics Concern  . Not on file   Social History Narrative   HEENT: Admits to nasal congestion, right ear pressure.  Denies tinnitus, ear discharge, sore throat, eye discharge or eye redness. MSK: Admits to pain in both wrists and left foot, numbness and tingling in wrists.  No other specific complaints in a complete review of systems (except as listed in HPI above).        Objective:   Physical Exam  BP 128/84 mmHg  Pulse 86  Temp(Src) 99 F (37.2 C) (Oral)  Wt 242 lb 12 oz (110.111 kg)  SpO2 96%  General:  Well-appearing, appears stated age in no acute distress. HEENT:  No scleral icterus or conjunctival injection.  Right ear cerumen present, TM not visible.  Left ear  scant cerumen present, TM pearly grey.  No erythema or exudates of the pharynx.  Pulm: Clear to auscultation bilaterally. No wheezes, rales, or rhonchi. CV: Regular rate and rhythm.  No murmurs, rubs, or gallops. MSK:  Strength 5/5 bilaterally upper and lower extremities. Full ROM wrists and fingers, no pain with movement.  Full ankle ROM, pain in left foot with inversion.  Left foot nontender to palpation.   Neuro: Positive Phalen's sign with left worse than right.      Assessment & Plan:   Bilateral Carpal Tunnel Syndrome   Schedule follow-up with neurologist. Continue wrist splints per neurology Continue Meloxicam, refilled today  Left Plantar Fascitis   Continue Meloxicam- refilled today  Do not take Ibuprofen while taking Meloxicam.   Call if symptoms worsen.  Right ear cerumen impaction  Flushed to relieve symptoms.   Call if symptoms recur.  RTC as needed or if symptoms persist or worsen. Webb Silversmith, NP

## 2016-07-01 ENCOUNTER — Encounter: Payer: Managed Care, Other (non HMO) | Admitting: Internal Medicine

## 2016-10-17 ENCOUNTER — Other Ambulatory Visit: Payer: Self-pay | Admitting: Internal Medicine

## 2016-10-17 DIAGNOSIS — M722 Plantar fascial fibromatosis: Secondary | ICD-10-CM

## 2016-10-17 DIAGNOSIS — G5603 Carpal tunnel syndrome, bilateral upper limbs: Secondary | ICD-10-CM

## 2017-04-02 IMAGING — CR DG WRIST COMPLETE 3+V*L*
2 series · 2 of 2 positions shown · non-contrast
Comparison: None.

CLINICAL DATA: Left wrist and hand pain. Swelling. Initial
evaluation.

EXAM:
LEFT WRIST - COMPLETE 3+ VIEW

[view not recorded (1 of 2)]
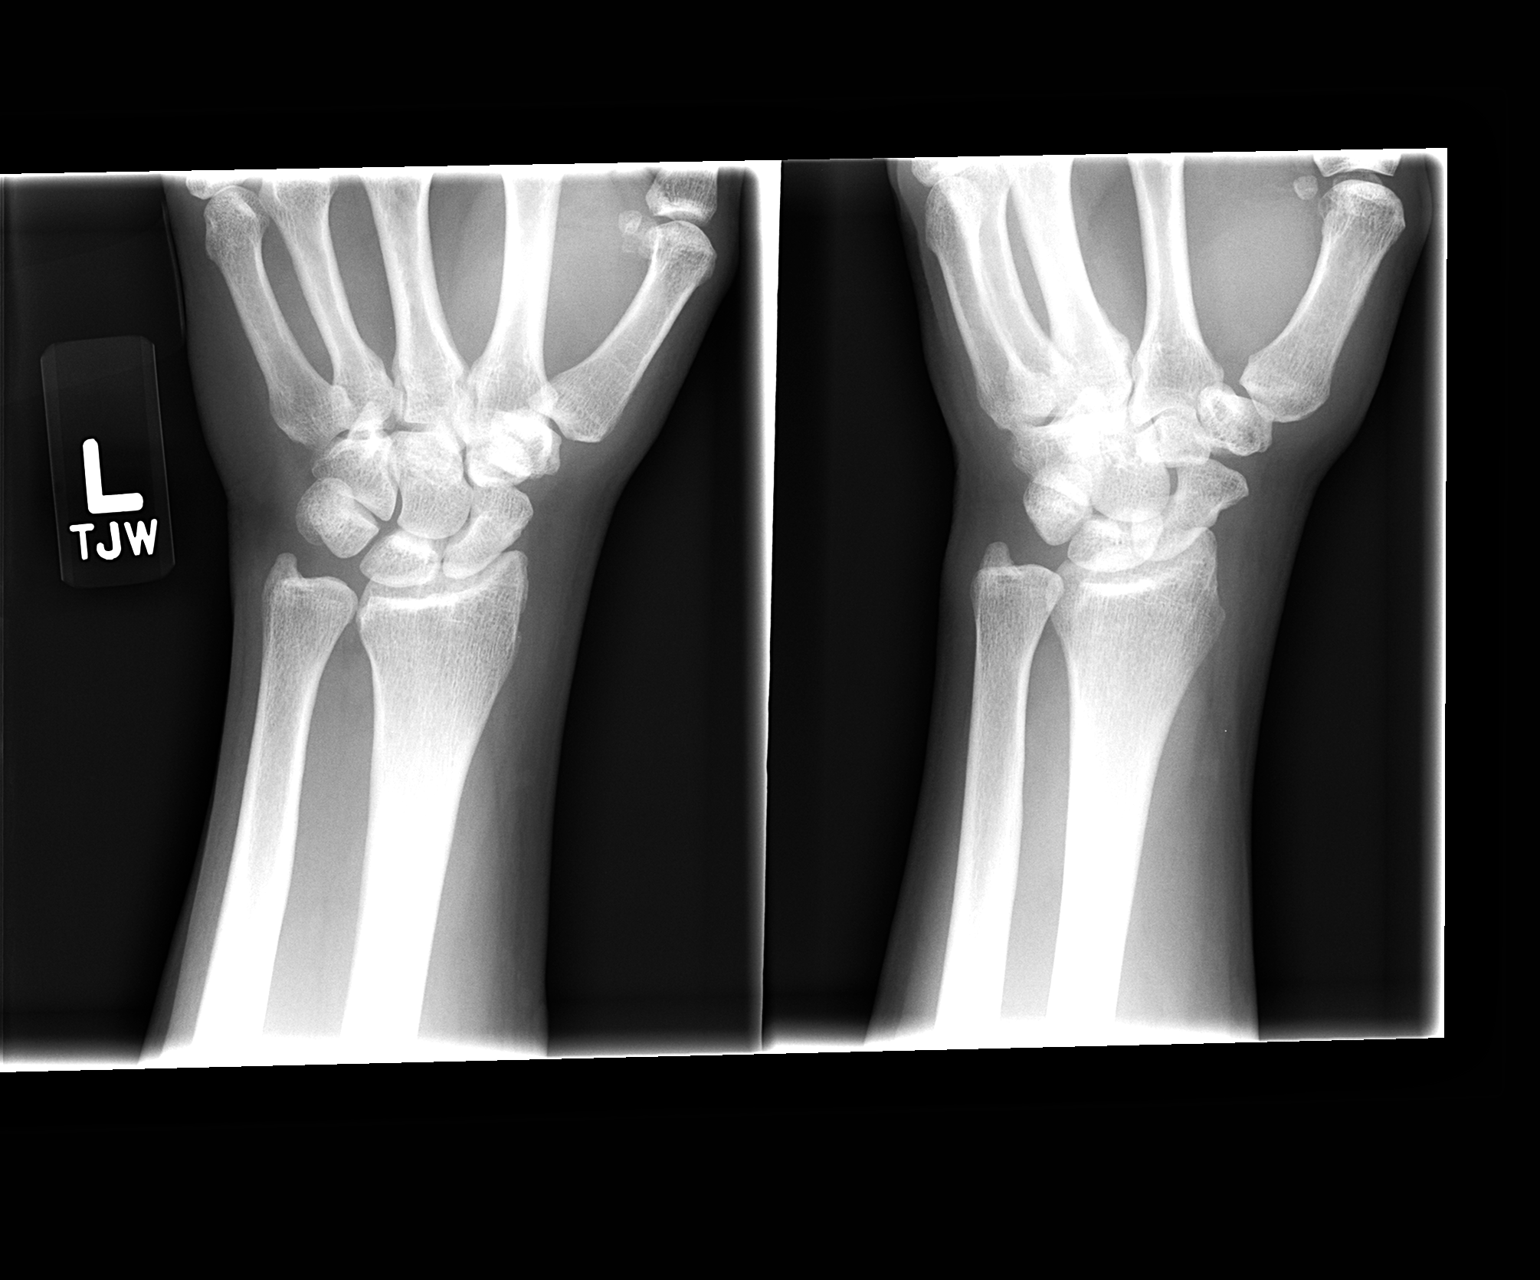

[view not recorded (2 of 2)]
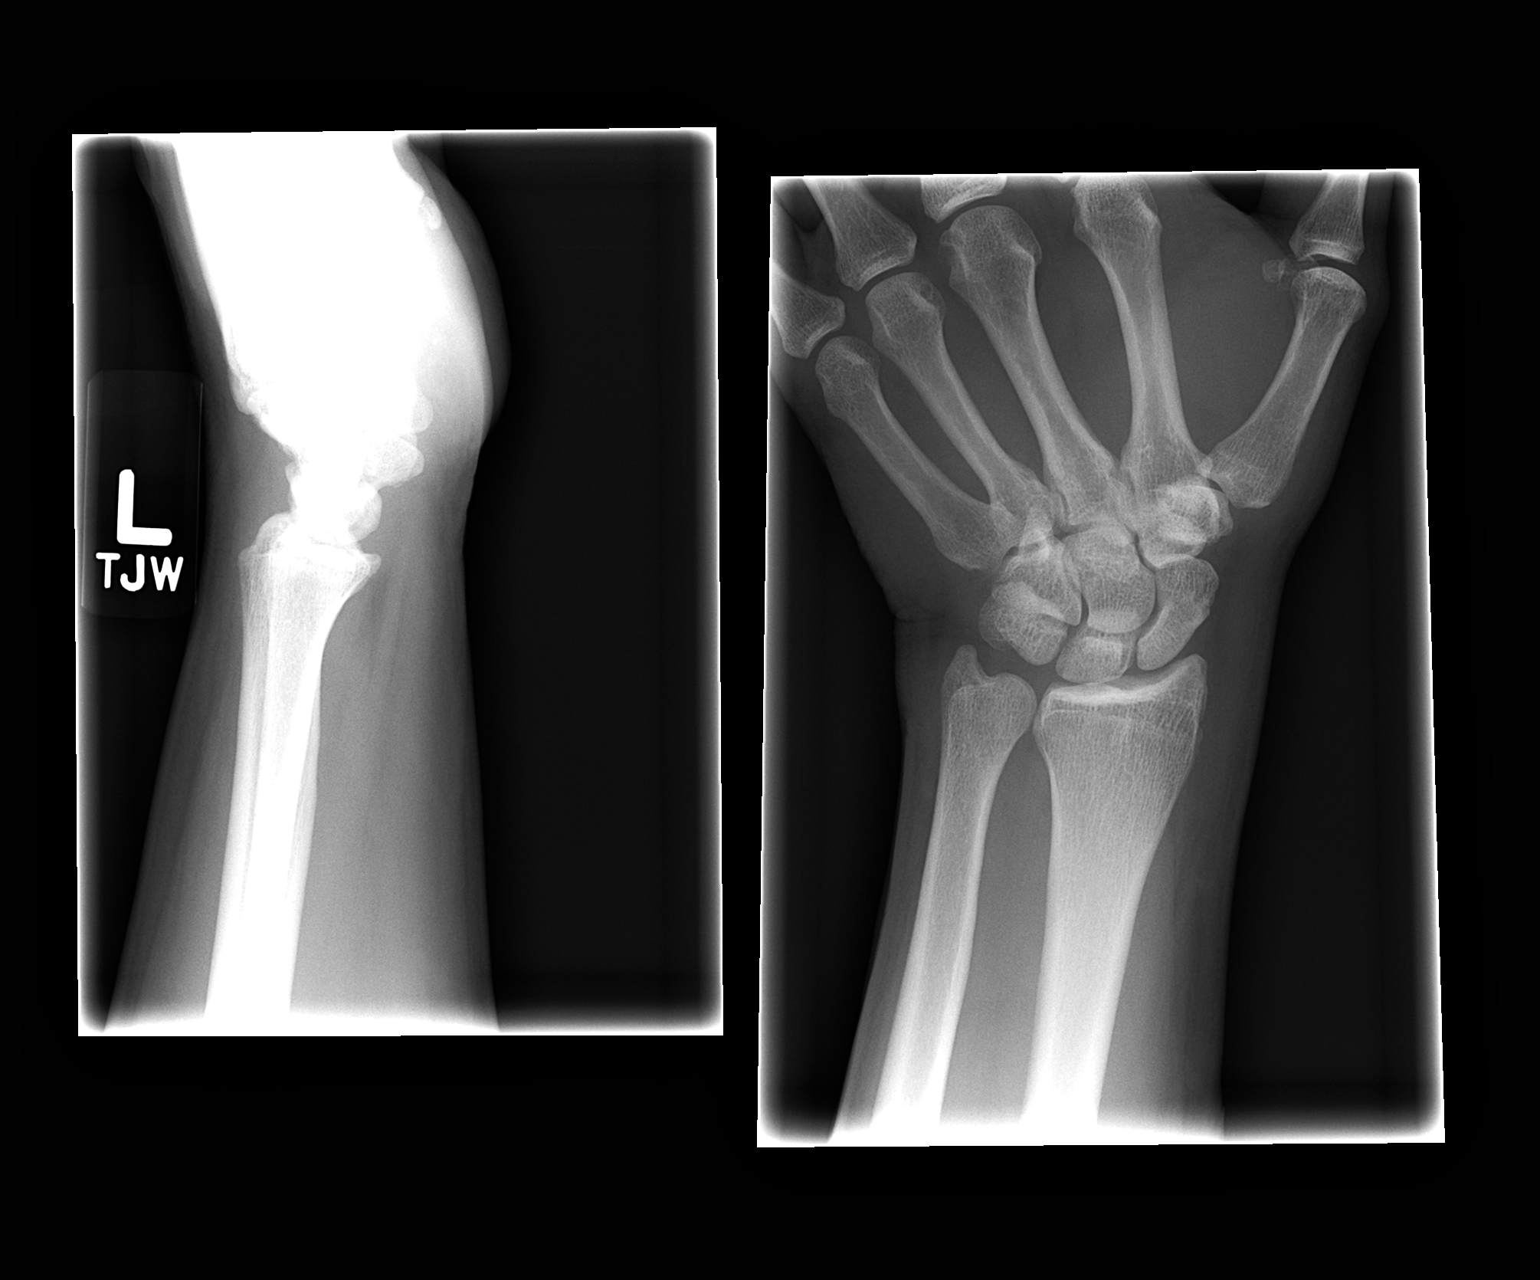

[2 of 2 positions shown; findings below may reference images not displayed]

FINDINGS: No acute bony or joint abnormality identified. Radiocarpal
degenerative change. No acute abnormality .
IMPRESSION: Radiocarpal degenerative change.  No acute abnormality.

## 2017-09-28 ENCOUNTER — Ambulatory Visit (INDEPENDENT_AMBULATORY_CARE_PROVIDER_SITE_OTHER)
Admission: RE | Admit: 2017-09-28 | Discharge: 2017-09-28 | Disposition: A | Discharge status: Home / Self Care | Payer: Managed Care, Other (non HMO) | Source: Ambulatory Visit | Attending: Internal Medicine | Admitting: Internal Medicine

## 2017-09-28 ENCOUNTER — Encounter: Payer: Self-pay | Admitting: Internal Medicine

## 2017-09-28 ENCOUNTER — Ambulatory Visit: Payer: Managed Care, Other (non HMO) | Admitting: Internal Medicine

## 2017-09-28 VITALS — BP 146/74 | HR 95 | Temp 98.1°F | Wt 262.0 lb

## 2017-09-28 DIAGNOSIS — R0602 Shortness of breath: Secondary | ICD-10-CM

## 2017-09-28 DIAGNOSIS — R0789 Other chest pain: Secondary | ICD-10-CM

## 2017-09-28 DIAGNOSIS — R569 Unspecified convulsions: Secondary | ICD-10-CM

## 2017-09-28 DIAGNOSIS — R7989 Other specified abnormal findings of blood chemistry: Secondary | ICD-10-CM | POA: Diagnosis not present

## 2017-09-28 NOTE — Progress Notes (Signed)
Subjective:    Patient ID: Ryan Hatfield, male    DOB: [birth year], 52 y.o.   MRN: [id number]  HPI  Pt presents to the clinic today with c/o an episode of seizure like activity. He reports this occurred last night. He was sitting on the couch, texting his ex wife. Suddenly, he reports he blacked out but did not lose consciousness. His head was moving up and down on it's own. His arms were contracting and shaking. He was confused and nauseated. He denies vomiting. He is not sure how long the episode lasted. He reports he went to bed to sleep it off. He woke up this am with a headache, slight chest pressure and shortness of breath. He describes the chest pressure as constant but mild. It is not worse with exertion. He has reflux and reports this is totally different. The shortness of breath is mild and also not worse with exertion. His BP today is 160/82, he reports he has never had high blood pressure before. He had a similar seizure like episode 2-3 months ago while standing at the stove. He reports his muscles were contracting and his head was shaking just like in this episode. He does not remember how long that episode lasted, but did not seek treatment for it because he thought it was a fluke. He has no history of seizures. He does not do recreational drugs or drink alcohol.  Review of Systems  Past Medical History:  Diagnosis Date  . Alcohol abuse, in remission 05/29/2015  . Anxiety and depression 05/29/2015  . Arthritis, degenerative 02/03/2013   Overview:  Of right middle finger MCP joint   . Cannot sleep 02/03/2013  . Chest pain 01/15/2013    Current Outpatient Medications  Medication Sig Dispense Refill  . aspirin 81 MG tablet Take 81 mg by mouth daily.    . Omega-3 Fatty Acids (FISH OIL BURP-LESS PO) Take 1 capsule by mouth daily. Reported on 08/13/2015     No current facility-administered medications for this visit.     Allergies  Allergen Reactions  . Paroxetine    REACTION: paranoia, confusion  . Sertraline Hcl     REACTION: paranoia, confusion    Family History  Problem Relation Age of Onset  . Heart disease Mother   . Arthritis Mother   . Heart disease Father   . Diabetes Brother   . Stroke Brother   . Cancer Maternal Grandmother   . Kidney cancer Paternal Uncle   . Prostate cancer Paternal Grandfather     Social History   Socioeconomic History  . Marital status: Divorced    Spouse name: Not on file  . Number of children: Not on file  . Years of education: Not on file  . Highest education level: Not on file  Occupational History  . Not on file  Social Needs  . Financial resource strain: Not on file  . Food insecurity:    Worry: Not on file    Inability: Not on file  . Transportation needs:    Medical: Not on file    Non-medical: Not on file  Tobacco Use  . Smoking status: Current Every Day Smoker    Packs/day: 1.00    Years: 40.00    Pack years: 40.00  . Smokeless tobacco: Never Used  Substance and Sexual Activity  . Alcohol use: No    Comment: last drink x 3 days ago  . Drug use: No  . Sexual activity: Yes  Lifestyle  .  Physical activity:    Days per week: Not on file    Minutes per session: Not on file  . Stress: Not on file  Relationships  . Social connections:    Talks on phone: Not on file    Gets together: Not on file    Attends religious service: Not on file    Active member of club or organization: Not on file    Attends meetings of clubs or organizations: Not on file    Relationship status: Not on file  . Intimate partner violence:    Fear of current or ex partner: Not on file    Emotionally abused: Not on file    Physically abused: Not on file    Forced sexual activity: Not on file  Other Topics Concern  . Not on file  Social History Narrative  . Not on file     Constitutional: Pt reports headache. Denies fever, malaise, fatigue, or abrupt weight changes.  HEENT: Denies eye pain, eye redness,  ear pain, ringing in the ears, wax buildup, runny nose, nasal congestion, bloody nose, or sore throat. Respiratory: Pt reports shortness of breath. Denies difficulty breathing, cough or sputum production.   Cardiovascular: Pt reports chest pressure. Denies chest pain,  palpitations or swelling in the hands or feet.  Gastrointestinal: Pt reports nausea. Denies abdominal pain, bloating, constipation, diarrhea or blood in the stool.  Neurological: Denies dizziness, difficulty with memory, difficulty with speech or problems with balance and coordination.    No other specific complaints in a complete review of systems (except as listed in HPI above).     Objective:   Physical Exam   BP (!) 160/82   Pulse 95   Temp 98.1 F (36.7 C) (Oral)   Wt 262 lb (118.8 kg)   SpO2 96%   BMI 38.69 kg/m  Wt Readings from Last 3 Encounters:  09/28/17 262 lb (118.8 kg)  01/17/16 242 lb 12 oz (110.1 kg)  09/14/15 244 lb (110.7 kg)    General: Appears his stated age, obese in NAD. Cardiovascular: Normal rate and rhythm. S1,S2 noted.  No murmur, rubs or gallops noted. No JVD or BLE edema. No carotid bruits noted. Pulmonary/Chest: Normal effort and positive vesicular breath sounds. No respiratory distress. No wheezes, rales or ronchi noted.  Abdomen: Soft and nontender. Normal bowel sounds.  Musculoskeletal: No pain with palpation over the sternum. Neurological: Alert and oriented.  Coordination normal.  Psychiatric: Mood and affect normal. Behavior is normal. Judgment and thought content normal.    BMET    Component Value Date/Time   NA 139 05/29/2015 1000   K 3.8 05/29/2015 1000   CL 102 05/29/2015 1000   CO2 29 05/29/2015 1000   GLUCOSE 113 (H) 05/29/2015 1000   BUN 15 05/29/2015 1000   CREATININE 1.02 05/29/2015 1000   CALCIUM 9.5 05/29/2015 1000   GFRNONAA >60 11/20/2014 2203   GFRAA >60 11/20/2014 2203    Lipid Panel     Component Value Date/Time   CHOL 168 09/06/2015 0831   TRIG  194 (H) 09/06/2015 0831   HDL 27 (L) 09/06/2015 0831   CHOLHDL 6.2 (H) 09/06/2015 0831   CHOLHDL 5 06/11/2015 0846   VLDL 51.4 (H) 06/11/2015 0846   LDLCALC 102 (H) 09/06/2015 0831    CBC    Component Value Date/Time   WBC 11.3 (H) 05/29/2015 1000   RBC 4.97 05/29/2015 1000   HGB 14.9 05/29/2015 1000   HCT 44.8 05/29/2015 1000  PLT 272.0 05/29/2015 1000   MCV 90.2 05/29/2015 1000   MCH 34.0 11/20/2014 2203   MCHC 33.4 05/29/2015 1000   RDW 13.7 05/29/2015 1000    Hgb A1C Lab Results  Component Value Date   HGBA1C 5.7 05/29/2015           Assessment & Plan:   Seizure Like Activity:  Will check CBC, CMET, TSH, HIV and RPR today Referral placed to neurology for further evaluation ER precautions discussed  Chest Pain and Shortness of Breath:  ECG today normal Chest xray for further evaluation Will check CBC, CMET, Troponin and D dimer  Elevated Blood Pressure:  Recheck 146/74 Reinforced DASH diet and exercise for weight loss Will hold off on antihypertensive medication at this time  Will follow up after labs, xray, ER precautions discussed Webb Silversmith, NP

## 2017-09-28 NOTE — Patient Instructions (Signed)
Seizure, Adult °When you have a seizure: °· Parts of your body may move. °· How aware or awake (conscious) you are may change. °· You may shake (convulse). ° °Some people have symptoms right before a seizure happens. These symptoms may include: °· Fear. °· Worry (anxiety). °· Feeling like you are going to throw up (nausea). °· Feeling like the room is spinning (vertigo). °· Feeling like you saw or heard something before (deja vu). °· Odd tastes or smells. °· Changes in vision, such as seeing flashing lights or spots. ° °Seizures usually last from 30 seconds to 2 minutes. Usually, they are not harmful unless they last a long time. °Follow these instructions at home: °Medicines °· Take over-the-counter and prescription medicines only as told by your doctor. °· Avoid anything that may keep your medicine from working, such as alcohol. °Activity °· Do not do any activities that would be dangerous if you had another seizure, like driving or swimming. Wait until your doctor approves. °· If you live in the [place] ask your local DMV (department of motor vehicles) when you can drive. °· Rest. °Teaching others °· Teach friends and family what to do when you have a seizure. They should: °? Lay you on the ground. °? Protect your head and body. °? Loosen any tight clothing around your neck. °? Turn you on your side. °? Stay with you until you are better. °? Not hold you down. °? Not put anything in your mouth. °? Know whether or not you need emergency care. °General instructions °· Contact your doctor each time you have a seizure. °· Avoid anything that gives you seizures. °· Keep a seizure diary. Write down: °? What you think caused each seizure. °? What you remember about each seizure. °· Keep all follow-up visits as told by your doctor. This is important. °Contact a doctor if: °· You have another seizure. °· You have seizures more often. °· There is any change in what happens during your seizures. °· You continue to have  seizures with treatment. °· You have symptoms of being sick or having an infection. °Get help right away if: °· You have a seizure: °? That lasts longer than 5 minutes. °? That is different than seizures you had before. °? That makes it harder to breathe. °? After you hurt your head. °· After a seizure, you cannot speak or use a part of your body. °· After a seizure, you are confused or have a bad headache. °· You have two or more seizures in a row. °· You are having seizures more often. °· You do not wake up right after a seizure. °· You get hurt during a seizure. °In an emergency: °· These symptoms may be an emergency. Do not wait to see if the symptoms will go away. Get medical help right away. Call your local emergency services (911 in the [place] Do not drive yourself to the hospital. °This information is not intended to replace advice given to you by your health care provider. Make sure you discuss any questions you have with your health care provider. °Document Released: 12/10/2007 Document Revised: 03/05/2016 Document Reviewed: 03/05/2016 °Elsevier Interactive Patient Education © 2017 [employer] ° °

## 2017-09-29 LAB — CBC
HCT: 43.6 % (ref 39.0–52.0)
HEMOGLOBIN: 14.8 g/dL (ref 13.0–17.0)
MCHC: 33.8 g/dL (ref 30.0–36.0)
MCV: 91 fl (ref 78.0–100.0)
PLATELETS: 264 10*3/uL (ref 150.0–400.0)
RBC: 4.8 Mil/uL (ref 4.22–5.81)
RDW: 13.8 % (ref 11.5–15.5)
WBC: 13.8 10*3/uL — AB (ref 4.0–10.5)

## 2017-09-29 LAB — COMPREHENSIVE METABOLIC PANEL WITH GFR
ALT: 19 U/L (ref 0–53)
AST: 22 U/L (ref 0–37)
Albumin: 4.4 g/dL (ref 3.5–5.2)
Alkaline Phosphatase: 70 U/L (ref 39–117)
BUN: 23 mg/dL (ref 6–23)
CO2: 25 meq/L (ref 19–32)
Calcium: 9.4 mg/dL (ref 8.4–10.5)
Chloride: 102 meq/L (ref 96–112)
Creatinine, Ser: 0.96 mg/dL (ref 0.40–1.50)
GFR: 86.72 mL/min
Glucose, Bld: 87 mg/dL (ref 70–99)
Potassium: 4.1 meq/L (ref 3.5–5.1)
Sodium: 138 meq/L (ref 135–145)
Total Bilirubin: 0.4 mg/dL (ref 0.2–1.2)
Total Protein: 7.4 g/dL (ref 6.0–8.3)

## 2017-09-29 LAB — HIV ANTIBODY (ROUTINE TESTING W REFLEX): HIV 1&2 Ab, 4th Generation: NONREACTIVE

## 2017-09-29 LAB — TSH: TSH: 5.08 u[IU]/mL — ABNORMAL HIGH (ref 0.35–4.50)

## 2017-09-29 LAB — D-DIMER, QUANTITATIVE (NOT AT ARMC): D DIMER QUANT: 0.48 ug{FEU}/mL (ref <0.50)

## 2017-09-29 LAB — TROPONIN I: TNIDX: 0 ug/l (ref 0.00–0.06)

## 2017-09-29 LAB — RPR: RPR Ser Ql: NONREACTIVE

## 2017-09-29 NOTE — Addendum Note (Signed)
Addended by: Lurlean Nanny on: 09/29/2017 06:05 PM   Modules accepted: Orders

## 2017-10-06 ENCOUNTER — Other Ambulatory Visit: Payer: Self-pay | Admitting: Neurology

## 2017-10-06 DIAGNOSIS — D332 Benign neoplasm of brain, unspecified: Secondary | ICD-10-CM

## 2017-10-13 ENCOUNTER — Other Ambulatory Visit: Payer: Self-pay | Admitting: Neurology

## 2017-10-13 ENCOUNTER — Ambulatory Visit
Admission: RE | Admit: 2017-10-13 | Discharge: 2017-10-13 | Disposition: A | Discharge status: Home / Self Care | Payer: Managed Care, Other (non HMO) | Source: Ambulatory Visit | Attending: Neurology | Admitting: Neurology

## 2017-10-13 DIAGNOSIS — D332 Benign neoplasm of brain, unspecified: Secondary | ICD-10-CM

## 2017-10-15 ENCOUNTER — Ambulatory Visit
Admission: RE | Admit: 2017-10-15 | Discharge: 2017-10-15 | Disposition: A | Discharge status: Home / Self Care | Payer: Managed Care, Other (non HMO) | Source: Ambulatory Visit | Attending: Neurology | Admitting: Neurology

## 2017-10-15 DIAGNOSIS — R569 Unspecified convulsions: Secondary | ICD-10-CM | POA: Insufficient documentation

## 2017-10-15 DIAGNOSIS — D332 Benign neoplasm of brain, unspecified: Secondary | ICD-10-CM

## 2017-10-15 MED ORDER — GADOBENATE DIMEGLUMINE 529 MG/ML IV SOLN
20.0000 mL | Freq: Once | INTRAVENOUS | Status: AC | PRN
  Administered 2017-10-15: 20 mL via INTRAVENOUS

## 2017-10-26 DIAGNOSIS — D332 Benign neoplasm of brain, unspecified: Secondary | ICD-10-CM | POA: Insufficient documentation

## 2017-10-26 DIAGNOSIS — R569 Unspecified convulsions: Secondary | ICD-10-CM | POA: Insufficient documentation

## 2017-10-26 DIAGNOSIS — R251 Tremor, unspecified: Secondary | ICD-10-CM | POA: Insufficient documentation

## 2017-10-30 ENCOUNTER — Other Ambulatory Visit (INDEPENDENT_AMBULATORY_CARE_PROVIDER_SITE_OTHER): Payer: Managed Care, Other (non HMO)

## 2017-10-30 DIAGNOSIS — R7989 Other specified abnormal findings of blood chemistry: Secondary | ICD-10-CM | POA: Diagnosis not present

## 2017-10-30 LAB — T4, FREE: FREE T4: 0.74 ng/dL (ref 0.60–1.60)

## 2017-10-30 LAB — T3, FREE: T3 FREE: 3.5 pg/mL (ref 2.3–4.2)

## 2017-10-30 LAB — TSH: TSH: 2.81 u[IU]/mL (ref 0.35–4.50)

## 2017-11-25 ENCOUNTER — Encounter: Payer: Self-pay | Admitting: Internal Medicine

## 2017-11-25 ENCOUNTER — Encounter
Admission: EM | Disposition: A | Discharge status: Home / Self Care | Payer: Self-pay | Source: Home / Self Care | Attending: Surgery

## 2017-11-25 ENCOUNTER — Ambulatory Visit: Payer: Managed Care, Other (non HMO) | Admitting: Internal Medicine

## 2017-11-25 ENCOUNTER — Other Ambulatory Visit: Payer: Self-pay

## 2017-11-25 ENCOUNTER — Emergency Department: Payer: Managed Care, Other (non HMO) | Admitting: Anesthesiology

## 2017-11-25 ENCOUNTER — Emergency Department: Payer: Managed Care, Other (non HMO)

## 2017-11-25 ENCOUNTER — Inpatient Hospital Stay
Admission: EM | Admit: 2017-11-25 | Discharge: 2017-11-28 | DRG: 343 | Disposition: A | Discharge status: Home / Self Care | Payer: Managed Care, Other (non HMO) | Attending: Surgery | Admitting: Surgery

## 2017-11-25 VITALS — BP 132/64 | HR 100 | Temp 100.0°F | Wt 227.0 lb

## 2017-11-25 DIAGNOSIS — R509 Fever, unspecified: Secondary | ICD-10-CM | POA: Diagnosis not present

## 2017-11-25 DIAGNOSIS — Z8249 Family history of ischemic heart disease and other diseases of the circulatory system: Secondary | ICD-10-CM | POA: Diagnosis not present

## 2017-11-25 DIAGNOSIS — F1011 Alcohol abuse, in remission: Secondary | ICD-10-CM | POA: Diagnosis present

## 2017-11-25 DIAGNOSIS — Z8051 Family history of malignant neoplasm of kidney: Secondary | ICD-10-CM | POA: Diagnosis not present

## 2017-11-25 DIAGNOSIS — Z888 Allergy status to other drugs, medicaments and biological substances status: Secondary | ICD-10-CM

## 2017-11-25 DIAGNOSIS — K35891 Other acute appendicitis without perforation, with gangrene: Principal | ICD-10-CM | POA: Diagnosis present

## 2017-11-25 DIAGNOSIS — R109 Unspecified abdominal pain: Secondary | ICD-10-CM | POA: Diagnosis present

## 2017-11-25 DIAGNOSIS — M19041 Primary osteoarthritis, right hand: Secondary | ICD-10-CM | POA: Diagnosis present

## 2017-11-25 DIAGNOSIS — R1030 Lower abdominal pain, unspecified: Secondary | ICD-10-CM | POA: Diagnosis not present

## 2017-11-25 DIAGNOSIS — K381 Appendicular concretions: Secondary | ICD-10-CM | POA: Diagnosis present

## 2017-11-25 DIAGNOSIS — F411 Generalized anxiety disorder: Secondary | ICD-10-CM | POA: Diagnosis present

## 2017-11-25 DIAGNOSIS — Z8042 Family history of malignant neoplasm of prostate: Secondary | ICD-10-CM | POA: Diagnosis not present

## 2017-11-25 DIAGNOSIS — R112 Nausea with vomiting, unspecified: Secondary | ICD-10-CM | POA: Diagnosis not present

## 2017-11-25 DIAGNOSIS — F329 Major depressive disorder, single episode, unspecified: Secondary | ICD-10-CM | POA: Diagnosis present

## 2017-11-25 DIAGNOSIS — I10 Essential (primary) hypertension: Secondary | ICD-10-CM | POA: Diagnosis present

## 2017-11-25 DIAGNOSIS — E669 Obesity, unspecified: Secondary | ICD-10-CM | POA: Diagnosis present

## 2017-11-25 DIAGNOSIS — Z79899 Other long term (current) drug therapy: Secondary | ICD-10-CM | POA: Diagnosis not present

## 2017-11-25 DIAGNOSIS — F17210 Nicotine dependence, cigarettes, uncomplicated: Secondary | ICD-10-CM | POA: Diagnosis present

## 2017-11-25 DIAGNOSIS — Z833 Family history of diabetes mellitus: Secondary | ICD-10-CM | POA: Diagnosis not present

## 2017-11-25 DIAGNOSIS — Z8261 Family history of arthritis: Secondary | ICD-10-CM | POA: Diagnosis not present

## 2017-11-25 DIAGNOSIS — Z823 Family history of stroke: Secondary | ICD-10-CM

## 2017-11-25 DIAGNOSIS — K3580 Unspecified acute appendicitis: Secondary | ICD-10-CM

## 2017-11-25 DIAGNOSIS — Z6834 Body mass index (BMI) 34.0-34.9, adult: Secondary | ICD-10-CM | POA: Diagnosis not present

## 2017-11-25 DIAGNOSIS — Z7982 Long term (current) use of aspirin: Secondary | ICD-10-CM

## 2017-11-25 HISTORY — PX: LAPAROSCOPIC APPENDECTOMY: SHX408

## 2017-11-25 HISTORY — DX: Essential (primary) hypertension: I10

## 2017-11-25 LAB — COMPREHENSIVE METABOLIC PANEL
ALK PHOS: 64 U/L (ref 38–126)
ALT: 14 U/L — AB (ref 17–63)
ANION GAP: 9 (ref 5–15)
AST: 14 U/L — ABNORMAL LOW (ref 15–41)
Albumin: 4.1 g/dL (ref 3.5–5.0)
BUN: 14 mg/dL (ref 6–20)
CALCIUM: 8.9 mg/dL (ref 8.9–10.3)
CO2: 25 mmol/L (ref 22–32)
CREATININE: 0.87 mg/dL (ref 0.61–1.24)
Chloride: 97 mmol/L — ABNORMAL LOW (ref 101–111)
Glucose, Bld: 109 mg/dL — ABNORMAL HIGH (ref 65–99)
Potassium: 3.8 mmol/L (ref 3.5–5.1)
SODIUM: 131 mmol/L — AB (ref 135–145)
TOTAL PROTEIN: 7.6 g/dL (ref 6.5–8.1)
Total Bilirubin: 1.5 mg/dL — ABNORMAL HIGH (ref 0.3–1.2)

## 2017-11-25 LAB — CBC
HCT: 46.1 % (ref 40.0–52.0)
HEMOGLOBIN: 15.7 g/dL (ref 13.0–18.0)
MCH: 30.5 pg (ref 26.0–34.0)
MCHC: 34.1 g/dL (ref 32.0–36.0)
MCV: 89.3 fL (ref 80.0–100.0)
PLATELETS: 251 10*3/uL (ref 150–440)
RBC: 5.16 MIL/uL (ref 4.40–5.90)
RDW: 13.8 % (ref 11.5–14.5)
WBC: 27.3 10*3/uL — AB (ref 3.8–10.6)

## 2017-11-25 LAB — URINALYSIS, COMPLETE (UACMP) WITH MICROSCOPIC
BILIRUBIN URINE: NEGATIVE
Bacteria, UA: NONE SEEN
GLUCOSE, UA: NEGATIVE mg/dL
KETONES UR: NEGATIVE mg/dL
NITRITE: NEGATIVE
PH: 5 (ref 5.0–8.0)
PROTEIN: 30 mg/dL — AB
Specific Gravity, Urine: 1.028 (ref 1.005–1.030)

## 2017-11-25 LAB — LACTIC ACID, PLASMA: Lactic Acid, Venous: 1.1 mmol/L (ref 0.5–1.9)

## 2017-11-25 LAB — LIPASE, BLOOD: Lipase: 22 U/L (ref 11–51)

## 2017-11-25 SURGERY — APPENDECTOMY, LAPAROSCOPIC
Anesthesia: General | Site: Abdomen | Wound class: Dirty or Infected

## 2017-11-25 MED ORDER — ROCURONIUM BROMIDE 100 MG/10ML IV SOLN
INTRAVENOUS | Status: DC | PRN
  Administered 2017-11-25: 20 mg via INTRAVENOUS
  Administered 2017-11-25: 30 mg via INTRAVENOUS

## 2017-11-25 MED ORDER — PIPERACILLIN-TAZOBACTAM 3.375 G IVPB
INTRAVENOUS | Status: AC
  Administered 2017-11-25: 3.375 g via INTRAVENOUS
  Filled 2017-11-25: qty 50

## 2017-11-25 MED ORDER — FENTANYL CITRATE (PF) 100 MCG/2ML IJ SOLN
INTRAMUSCULAR | Status: DC | PRN
  Administered 2017-11-25: 100 ug via INTRAVENOUS
  Administered 2017-11-25: 50 ug via INTRAVENOUS
  Administered 2017-11-25: 25 ug via INTRAVENOUS
  Administered 2017-11-25: 75 ug via INTRAVENOUS

## 2017-11-25 MED ORDER — OXYCODONE HCL 5 MG PO TABS: 5.0000 mg | ORAL_TABLET | Freq: Once | ORAL | Status: DC | PRN

## 2017-11-25 MED ORDER — LIDOCAINE HCL (PF) 2 % IJ SOLN
INTRAMUSCULAR | Status: AC
  Filled 2017-11-25: qty 10

## 2017-11-25 MED ORDER — PIPERACILLIN-TAZOBACTAM 3.375 G IVPB 30 MIN
3.3750 g | Freq: Once | INTRAVENOUS | Status: AC
  Administered 2017-11-25: 3.375 g via INTRAVENOUS

## 2017-11-25 MED ORDER — ROCURONIUM BROMIDE 50 MG/5ML IV SOLN
INTRAVENOUS | Status: AC
  Filled 2017-11-25: qty 1

## 2017-11-25 MED ORDER — SUCCINYLCHOLINE CHLORIDE 20 MG/ML IJ SOLN
INTRAMUSCULAR | Status: DC | PRN
  Administered 2017-11-25: 100 mg via INTRAVENOUS

## 2017-11-25 MED ORDER — MIDAZOLAM HCL 2 MG/2ML IJ SOLN
INTRAMUSCULAR | Status: DC | PRN
  Administered 2017-11-25: 2 mg via INTRAVENOUS

## 2017-11-25 MED ORDER — HYDROCODONE-ACETAMINOPHEN 5-325 MG PO TABS
1.0000 | ORAL_TABLET | ORAL | Status: DC | PRN
  Administered 2017-11-26 – 2017-11-28 (×10): 2 via ORAL
  Filled 2017-11-25 (×10): qty 2

## 2017-11-25 MED ORDER — ONDANSETRON HCL 4 MG PO TABS: 4.0000 mg | ORAL_TABLET | Freq: Four times a day (QID) | ORAL | Status: DC | PRN

## 2017-11-25 MED ORDER — HEPARIN SODIUM (PORCINE) 5000 UNIT/ML IJ SOLN
5000.0000 [IU] | Freq: Three times a day (TID) | INTRAMUSCULAR | Status: DC
  Administered 2017-11-26 – 2017-11-28 (×7): 5000 [IU] via SUBCUTANEOUS
  Filled 2017-11-25 (×7): qty 1

## 2017-11-25 MED ORDER — MEPERIDINE HCL 50 MG/ML IJ SOLN: 6.2500 mg | INTRAMUSCULAR | Status: DC | PRN

## 2017-11-25 MED ORDER — IOPAMIDOL (ISOVUE-300) INJECTION 61%
100.0000 mL | Freq: Once | INTRAVENOUS | Status: AC | PRN
  Administered 2017-11-25: 100 mL via INTRAVENOUS

## 2017-11-25 MED ORDER — HEPARIN SODIUM (PORCINE) 5000 UNIT/ML IJ SOLN
5000.0000 [IU] | Freq: Three times a day (TID) | INTRAMUSCULAR | Status: DC
  Administered 2017-11-25: 5000 [IU] via SUBCUTANEOUS
  Filled 2017-11-25: qty 1

## 2017-11-25 MED ORDER — FENTANYL CITRATE (PF) 250 MCG/5ML IJ SOLN
INTRAMUSCULAR | Status: AC
  Filled 2017-11-25: qty 5

## 2017-11-25 MED ORDER — PIPERACILLIN-TAZOBACTAM 3.375 G IVPB 30 MIN: 3.3750 g | Freq: Three times a day (TID) | INTRAVENOUS | Status: DC

## 2017-11-25 MED ORDER — BUPIVACAINE-EPINEPHRINE (PF) 0.25% -1:200000 IJ SOLN
INTRAMUSCULAR | Status: DC | PRN
  Administered 2017-11-25: 30 mL via PERINEURAL

## 2017-11-25 MED ORDER — BUPIVACAINE-EPINEPHRINE (PF) 0.25% -1:200000 IJ SOLN
INTRAMUSCULAR | Status: AC
  Filled 2017-11-25: qty 30

## 2017-11-25 MED ORDER — MIDAZOLAM HCL 2 MG/2ML IJ SOLN
INTRAMUSCULAR | Status: AC
  Filled 2017-11-25: qty 2

## 2017-11-25 MED ORDER — LACTATED RINGERS IV SOLN
INTRAVENOUS | Status: DC | PRN
  Administered 2017-11-25: 21:00:00 via INTRAVENOUS

## 2017-11-25 MED ORDER — SUCCINYLCHOLINE CHLORIDE 20 MG/ML IJ SOLN
INTRAMUSCULAR | Status: AC
  Filled 2017-11-25: qty 1

## 2017-11-25 MED ORDER — PIPERACILLIN-TAZOBACTAM 3.375 G IVPB
3.3750 g | Freq: Three times a day (TID) | INTRAVENOUS | Status: DC
  Administered 2017-11-26 – 2017-11-28 (×7): 3.375 g via INTRAVENOUS
  Filled 2017-11-25 (×7): qty 50

## 2017-11-25 MED ORDER — ALBUTEROL SULFATE HFA 108 (90 BASE) MCG/ACT IN AERS
INHALATION_SPRAY | RESPIRATORY_TRACT | Status: DC | PRN
  Administered 2017-11-25: 10 via RESPIRATORY_TRACT
  Administered 2017-11-25: 15 via RESPIRATORY_TRACT

## 2017-11-25 MED ORDER — ONDANSETRON HCL 4 MG/2ML IJ SOLN
INTRAMUSCULAR | Status: DC | PRN
  Administered 2017-11-25: 4 mg via INTRAVENOUS

## 2017-11-25 MED ORDER — PROPOFOL 10 MG/ML IV BOLUS
INTRAVENOUS | Status: AC
  Filled 2017-11-25: qty 20

## 2017-11-25 MED ORDER — PROPOFOL 10 MG/ML IV BOLUS
INTRAVENOUS | Status: DC | PRN
  Administered 2017-11-25: 200 mg via INTRAVENOUS

## 2017-11-25 MED ORDER — LIDOCAINE HCL URETHRAL/MUCOSAL 2 % EX GEL
CUTANEOUS | Status: AC
  Filled 2017-11-25: qty 5

## 2017-11-25 MED ORDER — ACETAMINOPHEN 325 MG PO TABS
650.0000 mg | ORAL_TABLET | Freq: Once | ORAL | Status: AC
  Administered 2017-11-25: 650 mg via ORAL
  Filled 2017-11-25: qty 2

## 2017-11-25 MED ORDER — LISINOPRIL 10 MG PO TABS
5.0000 mg | ORAL_TABLET | Freq: Every day | ORAL | Status: DC
  Administered 2017-11-26 – 2017-11-28 (×4): 5 mg via ORAL
  Filled 2017-11-25 (×4): qty 1

## 2017-11-25 MED ORDER — FENTANYL CITRATE (PF) 100 MCG/2ML IJ SOLN
25.0000 ug | INTRAMUSCULAR | Status: DC | PRN
  Administered 2017-11-25 (×3): 50 ug via INTRAVENOUS

## 2017-11-25 MED ORDER — HYDROMORPHONE HCL 1 MG/ML IJ SOLN
0.5000 mg | INTRAMUSCULAR | Status: DC | PRN
  Administered 2017-11-25 – 2017-11-26 (×4): 0.5 mg via INTRAVENOUS
  Filled 2017-11-25 (×4): qty 0.5

## 2017-11-25 MED ORDER — DEXTROSE IN LACTATED RINGERS 5 % IV SOLN
INTRAVENOUS | Status: DC
  Administered 2017-11-25 – 2017-11-26 (×3): via INTRAVENOUS

## 2017-11-25 MED ORDER — OXYCODONE HCL 5 MG/5ML PO SOLN: 5.0000 mg | Freq: Once | ORAL | Status: DC | PRN

## 2017-11-25 MED ORDER — ASPIRIN EC 81 MG PO TBEC
81.0000 mg | DELAYED_RELEASE_TABLET | Freq: Every day | ORAL | Status: DC
Start: 2017-11-25 — End: 2017-11-28
  Administered 2017-11-26 – 2017-11-28 (×4): 81 mg via ORAL
  Filled 2017-11-25 (×4): qty 1

## 2017-11-25 MED ORDER — ALBUTEROL SULFATE HFA 108 (90 BASE) MCG/ACT IN AERS
INHALATION_SPRAY | RESPIRATORY_TRACT | Status: AC
  Filled 2017-11-25: qty 6.7

## 2017-11-25 MED ORDER — PROMETHAZINE HCL 25 MG/ML IJ SOLN
6.2500 mg | INTRAMUSCULAR | Status: DC | PRN
Start: 2017-11-25 — End: 2017-11-25

## 2017-11-25 MED ORDER — DEXAMETHASONE SODIUM PHOSPHATE 10 MG/ML IJ SOLN
INTRAMUSCULAR | Status: DC | PRN
  Administered 2017-11-25: 10 mg via INTRAVENOUS

## 2017-11-25 MED ORDER — SUGAMMADEX SODIUM 200 MG/2ML IV SOLN
INTRAVENOUS | Status: AC
  Filled 2017-11-25: qty 2

## 2017-11-25 MED ORDER — ONDANSETRON HCL 4 MG/2ML IJ SOLN: 4.0000 mg | Freq: Four times a day (QID) | INTRAMUSCULAR | Status: DC | PRN

## 2017-11-25 MED ORDER — DEXAMETHASONE SODIUM PHOSPHATE 10 MG/ML IJ SOLN
INTRAMUSCULAR | Status: AC
  Filled 2017-11-25: qty 1

## 2017-11-25 MED ORDER — ONDANSETRON HCL 4 MG/2ML IJ SOLN
INTRAMUSCULAR | Status: AC
  Filled 2017-11-25: qty 2

## 2017-11-25 MED ORDER — LIDOCAINE HCL (CARDIAC) PF 100 MG/5ML IV SOSY
PREFILLED_SYRINGE | INTRAVENOUS | Status: DC | PRN
  Administered 2017-11-25: 100 mg via INTRAVENOUS

## 2017-11-25 MED ORDER — SUGAMMADEX SODIUM 200 MG/2ML IV SOLN
INTRAVENOUS | Status: DC | PRN
  Administered 2017-11-25: 400 mg via INTRAVENOUS

## 2017-11-25 MED ORDER — FENTANYL CITRATE (PF) 100 MCG/2ML IJ SOLN
INTRAMUSCULAR | Status: AC
  Filled 2017-11-25: qty 2

## 2017-11-25 MED ORDER — FENTANYL CITRATE (PF) 100 MCG/2ML IJ SOLN
INTRAMUSCULAR | Status: AC
  Administered 2017-11-25: 50 ug via INTRAVENOUS
  Filled 2017-11-25: qty 2

## 2017-11-25 SURGICAL SUPPLY — 41 items
ADHESIVE MASTISOL STRL (MISCELLANEOUS) ×3 IMPLANT
APPLIER CLIP ROT 10 11.4 M/L (STAPLE) ×3
BLADE SURG SZ11 CARB STEEL (BLADE) ×3 IMPLANT
CANISTER SUCT 3000ML PPV (MISCELLANEOUS) ×3 IMPLANT
CHLORAPREP W/TINT 26ML (MISCELLANEOUS) ×3 IMPLANT
CLIP APPLIE ROT 10 11.4 M/L (STAPLE) ×1 IMPLANT
CLOSURE WOUND 1/2 X4 (GAUZE/BANDAGES/DRESSINGS) ×1
CUTTER FLEX LINEAR 45M (STAPLE) ×3 IMPLANT
DEVICE TROCAR PUNCTURE CLOSURE (ENDOMECHANICALS) ×3 IMPLANT
ELECT REM PT RETURN 9FT ADLT (ELECTROSURGICAL) ×3
ELECTRODE REM PT RTRN 9FT ADLT (ELECTROSURGICAL) ×1 IMPLANT
GLOVE BIO SURGEON STRL SZ8 (GLOVE) ×9 IMPLANT
GOWN STRL REUS W/ TWL LRG LVL3 (GOWN DISPOSABLE) ×2 IMPLANT
GOWN STRL REUS W/TWL LRG LVL3 (GOWN DISPOSABLE) ×4
IRRIGATION STRYKERFLOW (MISCELLANEOUS) ×1 IMPLANT
IRRIGATOR STRYKERFLOW (MISCELLANEOUS) ×3
KIT TURNOVER KIT A (KITS) ×3 IMPLANT
LABEL OR SOLS (LABEL) ×3 IMPLANT
NEEDLE HYPO 22GX1.5 SAFETY (NEEDLE) ×3 IMPLANT
NEEDLE VERESS 14GA 120MM (NEEDLE) ×3 IMPLANT
NS IRRIG 500ML POUR BTL (IV SOLUTION) ×3 IMPLANT
PACK LAP CHOLECYSTECTOMY (MISCELLANEOUS) ×3 IMPLANT
POUCH SPECIMEN RETRIEVAL 10MM (ENDOMECHANICALS) ×3 IMPLANT
RELOAD 45 VASCULAR/THIN (ENDOMECHANICALS) ×3 IMPLANT
RELOAD STAPLE TA45 3.5 REG BLU (ENDOMECHANICALS) ×3 IMPLANT
SCISSORS METZENBAUM CVD 33 (INSTRUMENTS) IMPLANT
SLEEVE ENDOPATH XCEL 5M (ENDOMECHANICALS) ×3 IMPLANT
SOL .9 NS 3000ML IRR  AL (IV SOLUTION) ×2
SOL .9 NS 3000ML IRR UROMATIC (IV SOLUTION) ×1 IMPLANT
SPONGE GAUZE 2X2 8PLY STER LF (GAUZE/BANDAGES/DRESSINGS) ×3
SPONGE GAUZE 2X2 8PLY STRL LF (GAUZE/BANDAGES/DRESSINGS) ×6 IMPLANT
SPONGE LAP 18X18 RF (DISPOSABLE) ×3 IMPLANT
STRIP CLOSURE SKIN 1/2X4 (GAUZE/BANDAGES/DRESSINGS) ×2 IMPLANT
SUT MNCRL 4-0 (SUTURE) ×2
SUT MNCRL 4-0 27XMFL (SUTURE) ×1
SUT VICRYL 0 TIES 12 18 (SUTURE) ×3 IMPLANT
SUTURE MNCRL 4-0 27XMF (SUTURE) ×1 IMPLANT
TRAY FOLEY MTR SLVR 16FR STAT (SET/KITS/TRAYS/PACK) ×3 IMPLANT
TROCAR XCEL 12X100 BLDLESS (ENDOMECHANICALS) ×3 IMPLANT
TROCAR XCEL NON-BLD 5MMX100MML (ENDOMECHANICALS) ×3 IMPLANT
TUBING INSUFFLATION (TUBING) ×3 IMPLANT

## 2017-11-25 NOTE — ED Notes (Signed)
Patient transported to CT 

## 2017-11-25 NOTE — Transfer of Care (Signed)
Immediate Anesthesia Transfer of Care Note  Patient: Ryan Hatfield  Procedure(s) Performed: APPENDECTOMY LAPAROSCOPIC (N/A Abdomen)  Patient Location: PACU  Anesthesia Type:General  Level of Consciousness: awake, alert  and oriented  Airway & Oxygen Therapy: Patient Spontanous Breathing and Patient connected to nasal cannula oxygen  Post-op Assessment: Report given to RN and Post -op Vital signs reviewed and stable  Post vital signs: Reviewed and stable  Last Vitals:  Vitals Value Taken Time  BP 147/75 11/25/2017  9:40 PM  Temp 37.4 C 11/25/2017  9:40 PM  Pulse 103 11/25/2017  9:40 PM  Resp 19 11/25/2017  9:40 PM  SpO2 97 % 11/25/2017  9:40 PM  Vitals shown include unvalidated device data.  Last Pain:  Vitals:   11/25/17 2009  TempSrc:   PainSc: 4          Complications: No apparent anesthesia complications

## 2017-11-25 NOTE — Anesthesia Procedure Notes (Signed)
Procedure Name: Intubation Date/Time: 11/25/2017 8:47 PM Performed by: Clinton Sawyer, CRNA Pre-anesthesia Checklist: Patient identified, Emergency Drugs available, Suction available, Patient being monitored and Timeout performed Patient Re-evaluated:Patient Re-evaluated prior to induction Oxygen Delivery Method: Circle system utilized Preoxygenation: Pre-oxygenation with 100% oxygen Induction Type: IV induction, Rapid sequence and Cricoid Pressure applied Laryngoscope Size: Mac and 4 Grade View: Grade II Tube type: Oral Tube size: 7.0 mm Number of attempts: 1 Airway Equipment and Method: Stylet Placement Confirmation: ETT inserted through vocal cords under direct vision,  positive ETCO2 and breath sounds checked- equal and bilateral Secured at: 22 cm Dental Injury: Teeth and Oropharynx as per pre-operative assessment

## 2017-11-25 NOTE — ED Triage Notes (Signed)
Pt c/o lower abd pain since Monday, with some loose stool, N/V.Marland Kitchen Denies black or bloody looking stools.. Denies hx of diverticulitis.

## 2017-11-25 NOTE — Anesthesia Preprocedure Evaluation (Signed)
Anesthesia Evaluation  Patient identified by MRN, date of birth, ID band Patient awake    Reviewed: Allergy & Precautions, NPO status , Patient's Chart, lab work & pertinent test results  History of Anesthesia Complications Negative for: history of anesthetic complications  Airway Mallampati: II  TM Distance: >3 FB Neck ROM: Full    Dental  (+) Poor Dentition, Missing   Pulmonary neg sleep apnea, neg COPD, Current Smoker,    breath sounds clear to auscultation- rhonchi (-) wheezing      Cardiovascular hypertension, Pt. on medications (-) CAD, (-) Past MI, (-) Cardiac Stents and (-) CABG  Rhythm:Regular Rate:Normal - Systolic murmurs and - Diastolic murmurs    Neuro/Psych PSYCHIATRIC DISORDERS Anxiety negative neurological ROS     GI/Hepatic negative GI ROS, Neg liver ROS,   Endo/Other  negative endocrine ROSneg diabetes  Renal/GU negative Renal ROS     Musculoskeletal  (+) Arthritis ,   Abdominal (+) + obese,   Peds  Hematology negative hematology ROS (+)   Anesthesia Other Findings Past Medical History: 05/29/2015: Alcohol abuse, in remission 05/29/2015: Anxiety and depression 02/03/2013: Arthritis, degenerative     Comment:  Overview:  Of right middle finger MCP joint  02/03/2013: Cannot sleep 01/15/2013: Chest pain No date: Hypertension   Reproductive/Obstetrics                             Anesthesia Physical Anesthesia Plan  ASA: II and emergent  Anesthesia Plan: General   Post-op Pain Management:    Induction: Intravenous and Rapid sequence  PONV Risk Score and Plan: 0 and Ondansetron and Midazolam  Airway Management Planned: Oral ETT  Additional Equipment:   Intra-op Plan:   Post-operative Plan: Extubation in OR  Informed Consent: I have reviewed the patients History and Physical, chart, labs and discussed the procedure including the risks, benefits and alternatives  for the proposed anesthesia with the patient or authorized representative who has indicated his/her understanding and acceptance.   Dental advisory given  Plan Discussed with: CRNA and Anesthesiologist  Anesthesia Plan Comments:         Anesthesia Quick Evaluation

## 2017-11-25 NOTE — Op Note (Signed)
laparascopic appendectomy   Ryan Hatfield Date of operation:  11/25/2017  Indications: The patient presented with a history of  abdominal pain. Workup has revealed findings consistent with acute appendicitis.  Pre-operative Diagnosis: Acute appendicitis  Post-operative Diagnosis: Acute appendicitis with focal rupture and gangrene  Surgeon: Jerrol Banana. Burt Knack, MD, FACS  Anesthesia: General with endotracheal tube  Procedure Details  The patient was seen again in the preop area. The options of surgery versus observation were reviewed with the patient and/or family. The risks of bleeding, infection, recurrence of symptoms, negative laparoscopy, potential for an open procedure, bowel injury, abscess or infection, were all reviewed as well. The patient was taken to Operating Room, identified as Ryan Hatfield and the procedure verified as laparoscopic appendectomy. A Time Out was held and the above information confirmed.  The patient was placed in the supine position and general anesthesia was induced.  Antibiotic prophylaxis was administered and VT E prophylaxis was in place. A Foley catheter was placed by the nursing staff.   The abdomen was prepped and draped in a sterile fashion. An infraumbilical incision was made. A Veress needle was placed and pneumoperitoneum was obtained. A 5 mm trocar port was placed without difficulty and the abdominal cavity was explored.  Under direct vision a 5 mm suprapubic port was placed and a 13 mm left lateral port was placed all under direct vision.  There was a phlegmon S mass lateral to the terminal ileum which was dissected out and found to contain the appendix.  The appendix was identified and found to be acutely inflamed it was gangrenous and ruptured.  There was a focal abscess on the lateral pelvic sidewall between the ileum and the pelvic sidewall was the abscess and appendix. The appendix was carefully dissected. The base of the appendix was dissected  out and divided with a standard load Endo GIA. The mesoappendix was divided and controlled with clips. The appendix was passed out through the left lateral port site with the aid of an Endo Catch bag.  It came out in multiple pieces which were inspected on the back table.  The right lower quadrant and pelvis was then irrigated with copious amounts of normal saline which was aspirated. Inspection  failed to identify any additional bleeding and there were no signs of bowel injury. Therefore the left lateral port site was closed under direct vision utilizing an Endo Close technique with 0 Vicryl interrupted sutures, all under direct vision.   Again the right lower quadrant was inspected there was no sign of bleeding or bowel injury therefore pneumoperitoneum was released, all ports were removed and the skin incisions were approximated with subcuticular 4-0 Monocryl. Steri-Strips and Mastisol and sterile dressings were placed.  The patient tolerated the procedure well, there were no complications. The sponge lap and needle count were correct at the end of the procedure.  The patient was taken to the recovery room in stable condition to be admitted for continued care.  Findings: Acute gangrenous appendicitis with focal abscess in the lateral pelvic sidewall involving the terminal ileum  Estimated Blood Loss: Minimal                  Specimens: appendix         Complications: None                  Ryan Cassata E. Burt Knack MD, FACS

## 2017-11-25 NOTE — H&P (Signed)
Ryan Hatfield is an 52 y.o. male.    Chief Complaint: Lower abdominal pain  HPI: This is a patient with lower abdominal pain he states is mostly in the right lower quadrant but is actually in both lower quadrants.  He is never had an episode like this before.  It started on Monday 2 days ago and has been gradually worsening.  He had diarrhea yesterday but no bowel movement today.  He has had no fevers or chills he is nauseated and has vomited about 6 times in the last 2 days.  He has not been able to eat.  He is not accompanied by any family members.  He states that he has a dog at home that is locked up and questions how he can take care of the dog in his house.  Patient states that he was a heavy drinker until 3 years ago when he stopped.  He continues to smoke tobacco 1 pack of cigarettes per day.  He works in the [employer] as a Librarian, academic.  He denies a history of cardiac disease or COPD.  Past Medical History:  Diagnosis Date  . Alcohol abuse, in remission 05/29/2015  . Anxiety and depression 05/29/2015  . Arthritis, degenerative 02/03/2013   Overview:  Of right middle finger MCP joint   . Cannot sleep 02/03/2013  . Chest pain 01/15/2013  . Hypertension     Past Surgical History:  Procedure Laterality Date  . SEPTOPLASTY    . TONSILLECTOMY      Family History  Problem Relation Age of Onset  . Heart disease Mother   . Arthritis Mother   . Heart disease Father   . Diabetes Brother   . Stroke Brother   . Cancer Maternal Grandmother   . Kidney cancer Paternal Uncle   . Prostate cancer Paternal Grandfather    Social History:  reports that he has been smoking.  He has a 40.00 pack-year smoking history. He has never used smokeless tobacco. He reports that he does not drink alcohol or use drugs.  Allergies:  Allergies  Allergen Reactions  . Paroxetine     REACTION: paranoia, confusion  . Sertraline Hcl     REACTION: paranoia, confusion   He has no family history of  colon cancer or other serious illnesses.  No one else in his family or at work is sick with this type of illness.  (Not in a hospital admission)   Review of Systems  Constitutional: Negative.   HENT: Negative.   Eyes: Negative.   Respiratory: Negative.   Cardiovascular: Negative.   Gastrointestinal: Positive for abdominal pain, diarrhea, nausea and vomiting. Negative for blood in stool, constipation, heartburn and melena.  Genitourinary: Negative.   Musculoskeletal: Negative.   Skin: Negative.   Neurological: Negative.   Endo/Heme/Allergies: Negative.   Psychiatric/Behavioral: Negative.      Physical Exam:  BP (!) 115/57   Pulse 88   Temp 99.4 F (37.4 C) (Oral)   Resp 17   Ht 5' 8"  (1.727 m)   Wt 227 lb (103 kg)   SpO2 96%   BMI 34.52 kg/m   Physical Exam  Constitutional: He is oriented to person, place, and time. He appears well-developed and well-nourished.  Non-toxic appearance. He does not appear ill.  It hurts him to move on the bed.  He smells of smoke  HENT:  Head: Normocephalic and atraumatic.  Eyes: Pupils are equal, round, and reactive to light. EOM are normal.  Cardiovascular: Normal  rate and regular rhythm.  Pulmonary/Chest: Effort normal and breath sounds normal. No stridor.  Abdominal: Soft. Normal appearance. He exhibits distension. He exhibits no ascites and no mass. There is tenderness in the right lower quadrant and left lower quadrant. There is guarding and tenderness at McBurney's point. There is no rigidity.  Maximum tenderness in the right lower quadrant at McBurney's point with a questionable Rovsing sign but he is tender in both lower quadrants with early peritoneal signs  Neurological: He is alert and oriented to person, place, and time.  Skin: Skin is warm and dry.  Psychiatric: He has a normal mood and affect. His behavior is normal.  Vitals reviewed.       Results for orders placed or performed during the hospital encounter of  11/25/17 (from the past 48 hour(s))  Lipase, blood     Status: None   Collection Time: 11/25/17  4:18 PM  Result Value Ref Range   Lipase 22 11 - 51 U/L    Comment: Performed at [facility], [address] [place], [place] [address]  Comprehensive metabolic panel     Status: Abnormal   Collection Time: 11/25/17  4:18 PM  Result Value Ref Range   Sodium 131 (L) 135 - 145 mmol/L   Potassium 3.8 3.5 - 5.1 mmol/L   Chloride 97 (L) 101 - 111 mmol/L   CO2 25 22 - 32 mmol/L   Glucose, Bld 109 (H) 65 - 99 mg/dL   BUN 14 6 - 20 mg/dL   Creatinine, Ser 0.87 0.61 - 1.24 mg/dL   Calcium 8.9 8.9 - 10.3 mg/dL   Total Protein 7.6 6.5 - 8.1 g/dL   Albumin 4.1 3.5 - 5.0 g/dL   AST 14 (L) 15 - 41 U/L   ALT 14 (L) 17 - 63 U/L   Alkaline Phosphatase 64 38 - 126 U/L   Total Bilirubin 1.5 (H) 0.3 - 1.2 mg/dL   GFR calc non Af Amer >60 >60 mL/min   GFR calc Af Amer >60 >60 mL/min    Comment: (NOTE) The eGFR has been calculated using the CKD EPI equation. This calculation has not been validated in all clinical situations. eGFR's persistently <60 mL/min signify possible Chronic Kidney Disease.    Anion gap 9 5 - 15    Comment: Performed at [facility], [address] [place], [place] [address]  CBC     Status: Abnormal   Collection Time: 11/25/17  4:18 PM  Result Value Ref Range   WBC 27.3 (H) 3.8 - 10.6 K/uL   RBC 5.16 4.40 - 5.90 MIL/uL   Hemoglobin 15.7 13.0 - 18.0 g/dL   HCT 46.1 40.0 - 52.0 %   MCV 89.3 80.0 - 100.0 fL   MCH 30.5 26.0 - 34.0 pg   MCHC 34.1 32.0 - 36.0 g/dL   RDW 13.8 11.5 - 14.5 %   Platelets 251 150 - 440 K/uL    Comment: Performed at [facility], [address] [place], [place] [address]  Urinalysis, Complete w Microscopic     Status: Abnormal   Collection Time: 11/25/17  4:18 PM  Result Value Ref Range   Color, Urine AMBER (A) YELLOW    Comment: BIOCHEMICALS MAY BE AFFECTED BY COLOR   APPearance CLEAR (A) CLEAR   Specific  Gravity, Urine 1.028 1.005 - 1.030   pH 5.0 5.0 - 8.0   Glucose, UA NEGATIVE NEGATIVE mg/dL   Hgb urine dipstick SMALL (A) NEGATIVE  Bilirubin Urine NEGATIVE NEGATIVE   Ketones, ur NEGATIVE NEGATIVE mg/dL   Protein, ur 30 (A) NEGATIVE mg/dL   Nitrite NEGATIVE NEGATIVE   Leukocytes, UA TRACE (A) NEGATIVE   RBC / HPF 0-5 0 - 5 RBC/hpf   WBC, UA 11-20 0 - 5 WBC/hpf   Bacteria, UA NONE SEEN NONE SEEN   Squamous Epithelial / LPF 0-5 0 - 5   Mucus PRESENT     Comment: Performed at [facility], [address] [place], [place] [address]  Lactic acid, plasma     Status: None   Collection Time: 11/25/17  4:18 PM  Result Value Ref Range   Lactic Acid, Venous 1.1 0.5 - 1.9 mmol/L    Comment: Performed at [facility], [address] [place], [place] [address]   Ct Abdomen Pelvis W Contrast  Result Date: 11/25/2017 CLINICAL DATA:  Lower abdominal pain with diarrhea, nausea and vomiting. EXAM: CT ABDOMEN AND PELVIS WITH CONTRAST TECHNIQUE: Multidetector CT imaging of the abdomen and pelvis was performed using the standard protocol following bolus administration of intravenous contrast. CONTRAST:  168m ISOVUE-300 IOPAMIDOL (ISOVUE-300) INJECTION 61% COMPARISON:  Unenhanced CT of the abdomen and pelvis on 01/24/2008 FINDINGS: Lower chest: No acute abnormality. Hepatobiliary: No focal liver abnormality is seen. No gallstones, gallbladder wall thickening, or biliary dilatation. Pancreas: Unremarkable. No pancreatic ductal dilatation or surrounding inflammatory changes. Spleen: Normal in size without focal abnormality. Adrenals/Urinary Tract: Adrenal glands are unremarkable. Kidneys are normal, without renal calculi, focal lesion, or hydronephrosis. Bladder is unremarkable. Stomach/Bowel: Prominent inflammation is identified in the right lower abdomen extending into the pelvis. This involves the terminal ileum but also the appendix and there appears to be a small focal calcified  appendicolith and thickening of the appendix. Degree of small bowel inflammation appears to be out of proportion to typical appendicitis and other differential considerations would have to be considered including inflammatory bowel disease, small bowel tumor with regional perforation and distal small bowel perforation. There is a single small focus of extraluminal air near distal small bowel. There is some degree of small bowel ileus proximal to the inflammatory process. No overt small bowel obstruction identified. The colon shows no evidence of dilatation or inflammation. No discrete focal abscess identified. Vascular/Lymphatic: No significant vascular findings are present. No enlarged abdominal or pelvic lymph nodes. Reproductive: Prostate is unremarkable. Other: No abdominal wall hernia or abnormality. No abdominopelvic ascites. Musculoskeletal: No acute or significant osseous findings. IMPRESSION: Prominent inflammatory process in the right lower quadrant and pelvis involving distal small bowel and terminal ileum up to the level of the cecum. There also is inflammation of the appendix and a focal calcified appendicolith. Although the small bowel inflammation may be secondary to appendicitis, small bowel thickening and inflammation appears to be out of proportion to typical appendicitis. Other than appendicitis, additional differential considerations include inflammatory bowel disease, small bowel tumor with perforation and other causes of distal small bowel perforation. No discrete abscess or significant free intraperitoneal air is identified. Only a single small focus of extraluminal air next to small bowel is identified on the CT study. Surgical consultation is recommended. Electronically Signed   By: GAletta EdouardM.D.   On: 11/25/2017 19:05     Assessment/Plan  Labs and CT scan are personally reviewed.  He has a markedly elevated white blood cell count.  On the CT scan there is thickening of the  appendix with an appendicolith.  There is also thickening of the terminal ileum with  a normal-appearing cecum.  My concern is that this could be appendicitis and that treating with antibiotics alone could ultimately result in an ruptured appendix.  This is certainly an option as this is not clear-cut appendicitis by CT criteria alone.  She is reviewed with the patient and the difficulty making a preoperative diagnosis.  My recommendations were to proceed with surgical intervention in the form of a diagnostic laparoscopy with laparoscopic appendectomy and identification of any other possible causes here.  This was discussed in detail with the patient.  I also discussed the risks of bleeding and infection and a negative laparoscopy or conversion to an open procedure.  I also discussed with him the potential for treating with antibiotics to see if he improves but my concern is that this could be an appendicitis that could rupture and he would not be adequately treated with antibiotics alone although this is an option.  He wishes to proceed with surgery.  This will be performed later this evening once it is scheduled.  Questions were answered for him he understood and agreed to proceed.  He is going to try to contact the family or friend who can care for his dog while he is in the hospital.  Florene Glen, MD, FACS

## 2017-11-25 NOTE — Patient Instructions (Signed)

## 2017-11-25 NOTE — Anesthesia Post-op Follow-up Note (Signed)
Anesthesia QCDR form completed.        

## 2017-11-25 NOTE — ED Triage Notes (Signed)
FIRST NURSE NOTE-sent for abdominal pain and low grade fever.  Ambulatory.

## 2017-11-25 NOTE — ED Provider Notes (Signed)
[facility] Emergency Department Provider Note   ____________________________________________   I have reviewed the triage vital signs and the nursing notes.   HISTORY  Chief Complaint Abdominal Pain   History limited by: Not Limited   HPI Ryan Hatfield is a 52 y.o. male who presents to the emergency department today because of concerns for abdominal pain.  The patient states that these pain started 2 days ago.  Located in the lower abdomen.  He does not feel like it is stronger on one side or the other.  It does come and go in waves.  He describes it as cramping although will be sharp at times.  Patient has had nausea and vomiting.  Has had some loose stools.  Denies blood in either.  Did not appreciate he had a fever.  He denies similar pain in the past.  Denies any history of abdominal surgeries.   Per medical record review patient has a history of chest pain.  Past Medical History:  Diagnosis Date  . Alcohol abuse, in remission 05/29/2015  . Anxiety and depression 05/29/2015  . Arthritis, degenerative 02/03/2013   Overview:  Of right middle finger MCP joint   . Cannot sleep 02/03/2013  . Chest pain 01/15/2013    Patient Active Problem List   Diagnosis Date Noted  . Plantar fasciitis of left foot 01/17/2016  . Carpal tunnel syndrome 01/17/2016  . Anxiety and depression 05/29/2015  . Alcohol abuse, in remission 05/29/2015  . Cannot sleep 02/03/2013  . Arthritis, degenerative 02/03/2013    Past Surgical History:  Procedure Laterality Date  . SEPTOPLASTY    . TONSILLECTOMY      Prior to Admission medications   Medication Sig Start Date End Date Taking? Authorizing Provider  aspirin 81 MG tablet Take 81 mg by mouth daily.    [provider]  lisinopril (PRINIVIL,ZESTRIL) 5 MG tablet Take by mouth. 10/05/17 10/05/18  [provider]  Omega-3 Fatty Acids (FISH OIL BURP-LESS PO) Take 1 capsule by mouth daily. Reported on 08/13/2015     [provider]    Allergies Paroxetine and Sertraline hcl  Family History  Problem Relation Age of Onset  . Heart disease Mother   . Arthritis Mother   . Heart disease Father   . Diabetes Brother   . Stroke Brother   . Cancer Maternal Grandmother   . Kidney cancer Paternal Uncle   . Prostate cancer Paternal Grandfather     Social History Social History   Tobacco Use  . Smoking status: Current Every Day Smoker    Packs/day: 1.00    Years: 40.00    Pack years: 40.00  . Smokeless tobacco: Never Used  Substance Use Topics  . Alcohol use: No    Comment: last drink x 3 days ago  . Drug use: No    Review of Systems Constitutional: No fever/chills Eyes: No visual changes. ENT: No sore throat. Cardiovascular: Denies chest pain. Respiratory: Denies shortness of breath. Gastrointestinal: Positive for lower abdominal pain. Positive for nausea, vomiting and loose stools.  Genitourinary: Negative for dysuria. Musculoskeletal: Negative for back pain. Skin: Negative for rash. Neurological: Negative for headaches, focal weakness or numbness.  ____________________________________________   PHYSICAL EXAM:  VITAL SIGNS: ED Triage Vitals  Enc Vitals Group     BP 11/25/17 1604 139/69     Pulse Rate 11/25/17 1604 (!) 101     Resp 11/25/17 1604 17     Temp 11/25/17 1604 (!) 101.3 F (  38.5 C)     Temp Source 11/25/17 1604 Oral     SpO2 11/25/17 1604 96 %     Weight 11/25/17 1605 227 lb (103 kg)     Height 11/25/17 1605 5\' 8"  (1.727 m)     Head Circumference --      Peak Flow --      Pain Score 11/25/17 1604 5   Constitutional: Alert and oriented. Well appearing and in no distress. Eyes: Conjunctivae are normal.  ENT   Head: Normocephalic and atraumatic.   Nose: No congestion/rhinnorhea.   Mouth/Throat: Mucous membranes are moist.   Neck: No stridor. Hematological/Lymphatic/Immunilogical: No cervical lymphadenopathy. Cardiovascular: Normal  rate, regular rhythm.  No murmurs, rubs, or gallops.  Respiratory: Normal respiratory effort without tachypnea nor retractions. Breath sounds are clear and equal bilaterally. No wheezes/rales/rhonchi. Gastrointestinal: Soft. Tender to palpation in the lower abdomen. No rebound. No guarding.  Genitourinary: Deferred Musculoskeletal: Normal range of motion in all extremities. No lower extremity edema. Neurologic:  Normal speech and language. No gross focal neurologic deficits are appreciated.  Skin:  Skin is warm, dry and intact. No rash noted. Psychiatric: Mood and affect are normal. Speech and behavior are normal. Patient exhibits appropriate insight and judgment.  ____________________________________________    LABS (pertinent positives/negatives)  Lipase 22 CMP na 131, k 3.8, glu 109, cr 0.87 CBC wbc 27.3, hgb 15.7, plt 251 UA clear, small urine, wbc 11-10 Lactic acid 1.1 ____________________________________________   EKG  None  ____________________________________________    RADIOLOGY  CT abd pel Inflammation in the RLQ. Concern for appendicitis  ____________________________________________   PROCEDURES  Procedures  ____________________________________________   INITIAL IMPRESSION / ASSESSMENT AND PLAN / ED COURSE  Pertinent labs & imaging results that were available during my care of the patient were reviewed by me and considered in my medical decision making (see chart for details).  She presented to the emergency department today because of concerns for abdominal pain on exam he was tender in the lower abdomen.  Patient had significant leukocytosis and was febrile.  Differential include appendicitis, diverticulitis, other intra-abdominal infection.  CT scan was performed which was concerning for appendicitis.  Surgery was consulted and will plan to take the patient to the ER.   ____________________________________________   FINAL CLINICAL IMPRESSION(S) /  ED DIAGNOSES  Final diagnoses:  Abdominal pain, unspecified abdominal location     Note: This dictation was prepared with Dragon dictation. Any transcriptional errors that result from this process are unintentional     Nance Pear, MD 11/25/17 2014

## 2017-11-25 NOTE — Progress Notes (Signed)
Subjective:    Patient ID: Ryan Hatfield, male    DOB: 10-Apr-1966, 52 y.o.   MRN: [id number]  HPI  Pt presents to the clinic today with c/o lower abdominal pain. This started 2 days ago. He describes the pain as cramping and tender. He has had some associated nausea and vomiting, but denies gas, bloating, diarrhea, constipation or blood in his stools. His appetite is not great right now. He denies fever, chills or body aches. He has tried [employer] without any relief. He has never had a colonoscopy.  Review of Systems  Past Medical History:  Diagnosis Date  . Alcohol abuse, in remission 05/29/2015  . Anxiety and depression 05/29/2015  . Arthritis, degenerative 02/03/2013   Overview:  Of right middle finger MCP joint   . Cannot sleep 02/03/2013  . Chest pain 01/15/2013    Current Outpatient Medications  Medication Sig Dispense Refill  . aspirin 81 MG tablet Take 81 mg by mouth daily.    . Omega-3 Fatty Acids (FISH OIL BURP-LESS PO) Take 1 capsule by mouth daily. Reported on 08/13/2015     No current facility-administered medications for this visit.     Allergies  Allergen Reactions  . Paroxetine     REACTION: paranoia, confusion  . Sertraline Hcl     REACTION: paranoia, confusion    Family History  Problem Relation Age of Onset  . Heart disease Mother   . Arthritis Mother   . Heart disease Father   . Diabetes Brother   . Stroke Brother   . Cancer Maternal Grandmother   . Kidney cancer Paternal Uncle   . Prostate cancer Paternal Grandfather     Social History   Socioeconomic History  . Marital status: Divorced    Spouse name: Not on file  . Number of children: Not on file  . Years of education: Not on file  . Highest education level: Not on file  Occupational History  . Not on file  Social Needs  . Financial resource strain: Not on file  . Food insecurity:    Worry: Not on file    Inability: Not on file  . Transportation needs:    Medical: Not on file     Non-medical: Not on file  Tobacco Use  . Smoking status: Current Every Day Smoker    Packs/day: 1.00    Years: 40.00    Pack years: 40.00  . Smokeless tobacco: Never Used  Substance and Sexual Activity  . Alcohol use: No    Comment: last drink x 3 days ago  . Drug use: No  . Sexual activity: Yes  Lifestyle  . Physical activity:    Days per week: Not on file    Minutes per session: Not on file  . Stress: Not on file  Relationships  . Social connections:    Talks on phone: Not on file    Gets together: Not on file    Attends religious service: Not on file    Active member of club or organization: Not on file    Attends meetings of clubs or organizations: Not on file    Relationship status: Not on file  . Intimate partner violence:    Fear of current or ex partner: Not on file    Emotionally abused: Not on file    Physically abused: Not on file    Forced sexual activity: Not on file  Other Topics Concern  . Not on file  Social History  Narrative  . Not on file     Constitutional: Denies fever, malaise, fatigue, headache or abrupt weight changes.  Respiratory: Denies difficulty breathing, shortness of breath, cough or sputum production.   Cardiovascular: Denies chest pain, chest tightness, palpitations or swelling in the hands or feet.  Gastrointestinal: Pt reports abdominal pain, nausea, vomiting. Denies bloating, constipation, diarrhea or blood in the stool.  GU: Denies urgency, frequency, pain with urination, burning sensation, blood in urine, odor or discharge.  No other specific complaints in a complete review of systems (except as listed in HPI above).     Objective:   Physical Exam   BP 132/64   Pulse 100   Temp 100 F (37.8 C) (Oral)   Wt 227 lb (103 kg)   SpO2 93%   BMI 33.52 kg/m  Wt Readings from Last 3 Encounters:  11/25/17 227 lb (103 kg)  09/28/17 262 lb (118.8 kg)  01/17/16 242 lb 12 oz (110.1 kg)    General: Appears his stated age, obese  in NAD. Cardiovascular: Tachycardic with normal rhythm. S1,S2 noted.  No murmur, rubs or gallops noted.  Pulmonary/Chest: Normal effort and positive vesicular breath sounds. No respiratory distress. No wheezes, rales or ronchi noted.  Abdomen: Soft and generally very tender even with light palpation. He is guarding. Hyperactive bowel sounds.   BMET    Component Value Date/Time   NA 138 09/28/2017 1648   K 4.1 09/28/2017 1648   CL 102 09/28/2017 1648   CO2 25 09/28/2017 1648   GLUCOSE 87 09/28/2017 1648   BUN 23 09/28/2017 1648   CREATININE 0.96 09/28/2017 1648   CALCIUM 9.4 09/28/2017 1648   GFRNONAA >60 11/20/2014 2203   GFRAA >60 11/20/2014 2203    Lipid Panel     Component Value Date/Time   CHOL 168 09/06/2015 0831   TRIG 194 (H) 09/06/2015 0831   HDL 27 (L) 09/06/2015 0831   CHOLHDL 6.2 (H) 09/06/2015 0831   CHOLHDL 5 06/11/2015 0846   VLDL 51.4 (H) 06/11/2015 0846   LDLCALC 102 (H) 09/06/2015 0831    CBC    Component Value Date/Time   WBC 13.8 (H) 09/28/2017 1648   RBC 4.80 09/28/2017 1648   HGB 14.8 09/28/2017 1648   HCT 43.6 09/28/2017 1648   PLT 264.0 09/28/2017 1648   MCV 91.0 09/28/2017 1648   MCH 34.0 11/20/2014 2203   MCHC 33.8 09/28/2017 1648   RDW 13.8 09/28/2017 1648    Hgb A1C Lab Results  Component Value Date   HGBA1C 5.7 05/29/2015           Assessment & Plan:   Fever, Lower Abdominal Pain, Nausea and Vomiting:  DDX include acute appendicitis, diverticulitis with perforation, diverticulitis without perforation Offered to check stat labs and obtain stat CT Abdomen with contrast I really think he should go to ER via EMS (he drove himself) but he reports he will not let me call EMS or his son He reports he will drive straight over the hospital Will call [facility] triage to let them know he is heading straight there  Will follow up after ER visit Webb Silversmith, NP

## 2017-11-26 ENCOUNTER — Encounter: Payer: Self-pay | Admitting: Surgery

## 2017-11-26 MED ORDER — KCL IN DEXTROSE-NACL 20-5-0.45 MEQ/L-%-% IV SOLN
INTRAVENOUS | Status: DC
  Administered 2017-11-26 – 2017-11-28 (×4): via INTRAVENOUS
  Filled 2017-11-26 (×5): qty 1000

## 2017-11-26 NOTE — Anesthesia Postprocedure Evaluation (Signed)
Anesthesia Post Note  Patient: [name]  Procedure(s) Performed: APPENDECTOMY LAPAROSCOPIC (N/A Abdomen)  Patient location during evaluation: PACU Anesthesia Type: General Level of consciousness: awake and alert and oriented Pain management: pain level controlled Vital Signs Assessment: post-procedure vital signs reviewed and stable Respiratory status: spontaneous breathing, nonlabored ventilation and respiratory function stable Cardiovascular status: blood pressure returned to baseline and stable Postop Assessment: no signs of nausea or vomiting Anesthetic complications: no     Last Vitals:  Vitals:   11/25/17 2300 11/26/17 0635  BP: 133/79 126/73  Pulse: 96 92  Resp: 20 18  Temp: 37.3 C 36.9 C  SpO2: 94% 93%    Last Pain:  Vitals:   11/26/17 0635  TempSrc: Oral  PainSc:                  [name]

## 2017-11-26 NOTE — Progress Notes (Signed)
[place] Hospital Day(s): 1.   Post op day(s): 1 Day Post-Op.   Interval History: Patient seen and examined, no acute events or new complaints since appendectomy overnight. Patient reports mild LLQ peri-incisional pain and tolerating liquids diet, denies N/V, fever/chills, CP, or SOB and has not yet ambulated in halls.  Review of Systems:  Constitutional: denies fever, chills  Respiratory: denies any shortness of breath  Cardiovascular: denies chest pain or palpitations  Gastrointestinal: abdominal pain, N/V, and bowel function as per interval history Musculoskeletal: denies pain, decreased motor or sensation Integumentary: denies any other rashes or skin discolorations except post-surgical wounds  Vital signs in last 24 hours: [min-max] current  Temp:  [98.4 F (36.9 C)-101.3 F (38.5 C)] 98.4 F (36.9 C) (05/23 0635) Pulse Rate:  [88-104] 92 (05/23 0635) Resp:  [14-20] 18 (05/23 0635) BP: (115-147)/(57-79) 126/73 (05/23 0635) SpO2:  [93 %-97 %] 93 % (05/23 0635) Weight:  [227 lb (103 kg)] 227 lb (103 kg) (05/22 1605)     Height: 5\' 8"  (172.7 cm) Weight: 227 lb (103 kg) BMI (Calculated): 34.52   Intake/Output this shift:  No intake/output data recorded.   Intake/Output last 2 shifts:  @IOLAST2SHIFTS @   Physical Exam:  Constitutional: alert, cooperative and no distress  Respiratory: breathing non-labored at rest  Cardiovascular: regular rate and sinus rhythm  Gastrointestinal: soft and non-distended with mild LLQ peri-incisional tenderness to palpation, post-surgical incisional wounds well-approximated without any surrounding erythema or drainage, dressings c/d/i  Labs:  CBC Latest Ref Rng & Units 11/25/2017 09/28/2017 05/29/2015  WBC 3.8 - 10.6 K/uL 27.3(H) 13.8(H) 11.3(H)  Hemoglobin 13.0 - 18.0 g/dL 15.7 14.8 14.9  Hematocrit 40.0 - 52.0 % 46.1 43.6 44.8  Platelets 150 - 440 K/uL 251 264.0 272.0   CMP Latest Ref Rng & Units 11/25/2017 09/28/2017  05/29/2015  Glucose 65 - 99 mg/dL 109(H) 87 113(H)  BUN 6 - 20 mg/dL 14 23 15   Creatinine 0.61 - 1.24 mg/dL 0.87 0.96 1.02  Sodium 135 - 145 mmol/L 131(L) 138 139  Potassium 3.5 - 5.1 mmol/L 3.8 4.1 3.8  Chloride 101 - 111 mmol/L 97(L) 102 102  CO2 22 - 32 mmol/L 25 25 29   Calcium 8.9 - 10.3 mg/dL 8.9 9.4 9.5  Total Protein 6.5 - 8.1 g/dL 7.6 7.4 7.2  Total Bilirubin 0.3 - 1.2 mg/dL 1.5(H) 0.4 0.6  Alkaline Phos 38 - 126 U/L 64 70 81  AST 15 - 41 U/L 14(L) 22 18  ALT 17 - 63 U/L 14(L) 19 14    Assessment/Plan: (ICD-10's: K35.30) 52 y.o. male doing overall well 1 Day Post-Op s/p laparoscopic appendectomy for gangrenous acute appendicitis, complicated by pertinent comorbidities including HTN, COPD without home oxygen, chronic ongoing tobacco abuse, former alcohol abuse, osteoarthritis, generalized anxiety disorder, and major depression disorder.   - pain control prn  - will advance diet as tolerated  - considering leukocytosis to WBC 27.3 at admission, will check am WBC  - discussed with Dr. Burt Knack (operating surgeon overnight), will continue IV antibiotics x 24-48 hours  - anticipate discharge home over next 1 - 2 days with prescription for oral antibiotics  - medical management of comorbidities (home medications)  - DVT prophylaxis, ambulation encouraged  All of the above findings and recommendations were discussed with the patient, and all of patient's questions were answered to his expressed satisfaction.  -- Marilynne Drivers Rosana Hoes, MD, [place]: [facility] - Partnering for exceptional care. Office: [phone]

## 2017-11-26 NOTE — Progress Notes (Signed)
Patient has had some pain today that has been relieved with pain medications.  He has spent most of the day in bed and receiving fluids and IV abx.  His sites had some initial drainage and were marked but have not increased throughout this shift.  Continue to monitor.

## 2017-11-27 LAB — CBC
HEMATOCRIT: 41.5 % (ref 40.0–52.0)
HEMOGLOBIN: 13.7 g/dL (ref 13.0–18.0)
MCH: 29.8 pg (ref 26.0–34.0)
MCHC: 32.9 g/dL (ref 32.0–36.0)
MCV: 90.8 fL (ref 80.0–100.0)
Platelets: 248 10*3/uL (ref 150–440)
RBC: 4.57 MIL/uL (ref 4.40–5.90)
RDW: 13.4 % (ref 11.5–14.5)
WBC: 20.2 10*3/uL — ABNORMAL HIGH (ref 3.8–10.6)

## 2017-11-27 LAB — SURGICAL PATHOLOGY

## 2017-11-27 MED ORDER — HYDROCODONE-ACETAMINOPHEN 5-325 MG PO TABS: 1.0000 | ORAL_TABLET | ORAL | 0 refills | Status: DC | PRN

## 2017-11-27 MED ORDER — AMOXICILLIN-POT CLAVULANATE 875-125 MG PO TABS: 1.0000 | ORAL_TABLET | Freq: Two times a day (BID) | ORAL | 0 refills | Status: AC

## 2017-11-27 NOTE — Progress Notes (Addendum)
[place] Hospital Day(s): 2.   Post op day(s): 2 Days Post-Op.   Interval History: Patient seen and examined, no acute events or new complaints overnight. Patient reports his LLQ peri-incisional and RLQ abdominal pain continue to improve with +flatus, +BM, and tolerating advancement of his diet with ambulation, and he denies any fever/chills, CP, or SOB.  Review of Systems:  Constitutional: denies fever, chills  Respiratory: denies any shortness of breath  Cardiovascular: denies chest pain or palpitations  Gastrointestinal: abdominal pain, N/V, and bowel function as per interval history Musculoskeletal: denies pain, decreased motor or sensation Integumentary: denies any other rashes or skin discolorations except post-surgical abdominal wounds as per interval history  Vital signs in last 24 hours: [min-max] current  Temp:  [97.9 F (36.6 C)-98.2 F (36.8 C)] 97.9 F (36.6 C) (05/24 0457) Pulse Rate:  [72-88] 72 (05/24 0457) Resp:  [16-18] 18 (05/24 0457) BP: (121-126)/(66-78) 121/78 (05/24 0457) SpO2:  [95 %-97 %] 96 % (05/24 0457)     Height: 5\' 8"  (172.7 cm) Weight: 227 lb (103 kg) BMI (Calculated): 34.52   Intake/Output this shift:  No intake/output data recorded.   Intake/Output last 2 shifts:  @IOLAST2SHIFTS @   Physical Exam:  Constitutional: alert, cooperative and no distress  Respiratory: breathing non-labored at rest  Cardiovascular: regular rate and sinus rhythm  Gastrointestinal: soft and non-distended with minimal abdominal tenderness to palpation, no guarding or rebound tenderness  Labs:  CBC Latest Ref Rng & Units 11/27/2017 11/25/2017 09/28/2017  WBC 3.8 - 10.6 K/uL 20.2(H) 27.3(H) 13.8(H)  Hemoglobin 13.0 - 18.0 g/dL 13.7 15.7 14.8  Hematocrit 40.0 - 52.0 % 41.5 46.1 43.6  Platelets 150 - 440 K/uL 248 251 264.0   CMP Latest Ref Rng & Units 11/25/2017 09/28/2017 05/29/2015  Glucose 65 - 99 mg/dL 109(H) 87 113(H)  BUN 6 - 20 mg/dL 14 23 15    Creatinine 0.61 - 1.24 mg/dL 0.87 0.96 1.02  Sodium 135 - 145 mmol/L 131(L) 138 139  Potassium 3.5 - 5.1 mmol/L 3.8 4.1 3.8  Chloride 101 - 111 mmol/L 97(L) 102 102  CO2 22 - 32 mmol/L 25 25 29   Calcium 8.9 - 10.3 mg/dL 8.9 9.4 9.5  Total Protein 6.5 - 8.1 g/dL 7.6 7.4 7.2  Total Bilirubin 0.3 - 1.2 mg/dL 1.5(H) 0.4 0.6  Alkaline Phos 38 - 126 U/L 64 70 81  AST 15 - 41 U/L 14(L) 22 18  ALT 17 - 63 U/L 14(L) 19 14   Imaging studies: No new pertinent imaging studies   Assessment/Plan: (ICD-10's: K35.30) 51 y.o. male doing overall well with slowly decreasing leukocytosis 2 Days Post-Op s/p laparoscopic appendectomy for gangrenous acute appendicitis, complicated by pertinent comorbidities including HTN, chronic ongoing tobacco abuse, former alcohol abuse, osteoarthritis, generalized anxiety disorder, and major depression disorder.              - pain control prn             - advance diet as tolerated             - considering WBC still 20 (27.3 at admission), will check likely one more am WBC             - discussed with Dr. Burt Knack (patient's operating surgeon), will continue IV antibiotics x 24-48 hours             - anticipate discharge home tomorrow with prescription for oral antibiotics             -  medical management of comorbidities (home medications)             - DVT prophylaxis, ambulation encouraged  All of the above findings and recommendations were discussed with the patient, and all of patient's questions were answered to his expressed satisfaction.  -- Marilynne Drivers Rosana Hoes, MD, [place]: [facility] - Partnering for exceptional care. Office: [phone]

## 2017-11-27 NOTE — Discharge Instructions (Signed)
In addition to included general post-operative instructions for Laparoscopic Appendectomy,  Diet: Resume home heart healthy diet.   Activity: No heavy lifting >20 pounds (children, pets, laundry, garbage) or strenuous activity until follow-up, but light activity and walking are encouraged. Do not drive or drink alcohol if taking narcotic pain medications.  Wound care: You may shower/get incision wet with soapy water and pat dry (do not rub incisions), but no baths or submerging incision underwater until follow-up.   Medications: Resume all home medications AND Complete prescribed course of antibiotics even if feeling better/well. For mild to moderate pain: acetaminophen (Tylenol) or ibuprofen/naproxen (if no kidney disease). Combining Tylenol with alcohol can substantially increase your risk of causing liver disease. Narcotic pain medications, if prescribed, can be used for severe pain, though may cause nausea, constipation, and drowsiness. Do not combine Tylenol and Percocet (or similar) within a 6 hour period as Percocet (and similar) contain(s) Tylenol. If you do not need the narcotic pain medication, you do not need to fill the prescription.  Call office [phone]) at any time if any questions, worsening pain, fevers/chills, bleeding, drainage from incision site, or other concerns.

## 2017-11-28 LAB — CBC
HEMATOCRIT: 44.2 % (ref 40.0–52.0)
HEMOGLOBIN: 14.8 g/dL (ref 13.0–18.0)
MCH: 30.4 pg (ref 26.0–34.0)
MCHC: 33.5 g/dL (ref 32.0–36.0)
MCV: 90.7 fL (ref 80.0–100.0)
Platelets: 267 10*3/uL (ref 150–440)
RBC: 4.88 MIL/uL (ref 4.40–5.90)
RDW: 13.6 % (ref 11.5–14.5)
WBC: 15.1 10*3/uL — ABNORMAL HIGH (ref 3.8–10.6)

## 2017-11-28 NOTE — Progress Notes (Signed)
Pt provided with discharge paperwork, scripts and instructions. Pt verbalizes understanding, questions asked and answered. NAD. Awaiting Ride.

## 2017-11-28 NOTE — Plan of Care (Signed)
Pt able to ambulate, eating adequately, drinking adequately. States pain is improved. Denies needs.

## 2017-11-30 LAB — CULTURE, BLOOD (ROUTINE X 2): Culture: NO GROWTH

## 2017-12-01 ENCOUNTER — Other Ambulatory Visit: Payer: Self-pay

## 2017-12-04 ENCOUNTER — Ambulatory Visit (INDEPENDENT_AMBULATORY_CARE_PROVIDER_SITE_OTHER): Payer: Managed Care, Other (non HMO) | Admitting: Surgery

## 2017-12-04 ENCOUNTER — Encounter: Payer: Self-pay | Admitting: Surgery

## 2017-12-04 VITALS — BP 129/72 | HR 102 | Temp 98.7°F | Ht 69.0 in | Wt 228.0 lb

## 2017-12-04 DIAGNOSIS — K3580 Unspecified acute appendicitis: Secondary | ICD-10-CM

## 2017-12-04 NOTE — Progress Notes (Signed)
Surgical Clinic Progress/Follow-up Note   HPI:  52 y.o. Male presents to clinic for post-op follow-up 9 Days s/p laparoscopic appendectomy for acute gangrenous appendicitis Burt Knack, 11/25/2017). Patient reports complete resolution of pre-operative pain and has been tolerating regular diet with +flatus and just recently began passing normal BM's after a brief post-operative period of constipation, for which he was taking once daily Dulcolax by mouth. He otherwise denies N/V, fever/chills, CP, or SOB and has been taking prescribed antibiotics with one dose left remaining.  Review of Systems:  Constitutional: denies fever/chills  Respiratory: denies shortness of breath, wheezing  Cardiovascular: denies chest pain, palpitations  Gastrointestinal: abdominal pain, N/V, and bowel function as per interval history Skin: Denies any other rashes or skin discolorations except post-surgical wounds as per interval history  Vital Signs:  BP 129/72   Pulse (!) 102   Temp 98.7 F (37.1 C) (Oral)   Ht 5\' 9"  (1.753 m)   Wt 228 lb (103.4 kg)   BMI 33.67 kg/m    Physical Exam:  Constitutional:  -- Obese body habitus  -- Awake, alert, and oriented x3  Pulmonary:  -- No crackles -- Equal breath sounds bilaterally -- Breathing non-labored at rest Cardiovascular:  -- S1, S2 present  -- No pericardial rubs  Gastrointestinal:  -- Soft and non-distended, non-tenderto palpation, no guarding/rebound tenderness -- Post-surgical incisions all well-approximated without any peri-incisional erythema or drainage -- No abdominal masses appreciated, pulsatile or otherwise  Musculoskeletal / Integumentary:  -- Wounds or skin discoloration: None appreciated except post-surgical incisions as described above (GI) -- Extremities: B/L UE and LE FROM, hands and feet warm, no edema   Assessment:  52 y.o. yo Male with a problem list including...  Patient Active Problem List   Diagnosis Date Noted  . Benign neoplasm  of brain ([place]) 10/26/2017  . Seizures ([place]) 10/26/2017  . Spells of trembling 10/26/2017  . Plantar fasciitis of left foot 01/17/2016  . Carpal tunnel syndrome 01/17/2016  . Bilateral hand pain 09/17/2015  . Numbness 09/17/2015  . Anxiety and depression 05/29/2015  . Alcohol abuse, in remission 05/29/2015  . Cannot sleep 02/03/2013  . Arthritis, degenerative 02/03/2013    presents to clinic for post-op follow-up evaluation, doing well 9 Days s/p laparoscopic appendectomy Burt Knack, 11/25/2017) for acute gangrenous appendicitis.  Plan:              - advance diet as tolerated              - continue showering and in 1 week, okay to submerge incisions under water (baths, swimming) prn             - no heavy lifting >20 lbs or strenuous activity, may in 1 week gradually resume all activities without restrictions over next 2 weeks             - apply sunblock particularly to incisions with sun exposure to reduce pigmentation of scars             - return to clinic as needed, instructed to call office if any questions or concerns  All of the above recommendations were discussed with the patient, and all of patient's questions were answered to his expressed satisfaction.  -- Marilynne Drivers Rosana Hoes, MD, [place]: [facility] - Partnering for exceptional care. Office: [phone]

## 2017-12-04 NOTE — Patient Instructions (Signed)
Please give [place] a call in case you have any questions or concerns.  GENERAL POST-OPERATIVE PATIENT INSTRUCTIONS   WOUND CARE INSTRUCTIONS:  Keep a dry clean dressing on the wound if there is drainage. The initial bandage may be removed after 24 hours.  Once the wound has quit draining you may leave it open to air.  If clothing rubs against the wound or causes irritation and the wound is not draining you may cover it with a dry dressing during the daytime.  Try to keep the wound dry and avoid ointments on the wound unless directed to do so.  If the wound becomes bright red and painful or starts to drain infected material that is not clear, please contact your physician immediately.  If the wound is mildly pink and has a thick firm ridge underneath it, this is normal, and is referred to as a healing ridge.  This will resolve over the next 4-6 weeks.  BATHING: You may shower if you have been informed of this by your surgeon. However, Please do not submerge in a tub, hot tub, or pool until incisions are completely sealed or have been told by your surgeon that you may do so.  DIET:  You may eat any foods that you can tolerate.  It is a good idea to eat a high fiber diet and take in plenty of fluids to prevent constipation.  If you do become constipated you may want to take a mild laxative or take ducolax tablets on a daily basis until your bowel habits are regular.  Constipation can be very uncomfortable, along with straining, after recent surgery.  ACTIVITY:  You are encouraged to cough and deep breath or use your incentive spirometer if you were given one, every 15-30 minutes when awake.  This will help prevent respiratory complications and low grade fevers post-operatively if you had a general anesthetic.  You may want to hug a pillow when coughing and sneezing to add additional support to the surgical area, if you had abdominal or chest surgery, which will decrease pain during these times.  You are  encouraged to walk and engage in light activity for the next two weeks.  You should not lift more than 20 pounds, until 12/09/2017 as it could put you at increased risk for complications.  Twenty pounds is roughly equivalent to a plastic bag of groceries. At that time- Listen to your body when lifting, if you have pain when lifting, stop and then try again in a few days. Soreness after doing exercises or activities of daily living is normal as you get back in to your normal routine.  MEDICATIONS:  Try to take narcotic medications and anti-inflammatory medications, such as tylenol, ibuprofen, naprosyn, etc., with food.  This will minimize stomach upset from the medication.  Should you develop nausea and vomiting from the pain medication, or develop a rash, please discontinue the medication and contact your physician.  You should not drive, make important decisions, or operate machinery when taking narcotic pain medication.  SUNBLOCK Use sun block to incision area over the next year if this area will be exposed to sun. This helps decrease scarring and will allow you avoid a permanent darkened area over your incision.  QUESTIONS:  Please feel free to call our office if you have any questions, and we will be glad to assist you. [phone]

## 2017-12-07 NOTE — Discharge Summary (Signed)
Physician Discharge Summary  Patient ID: Ryan Hatfield MRN: [id number] DOB/AGE: 07-12-1965 52 y.o.  Admit date: 11/25/2017 Discharge date: 11/28/2017  Admission Diagnoses:  Discharge Diagnoses:  Active Problems:   * No active hospital problems. *   Discharged Condition: good  Hospital Course: 52 y.o. male presented to [facility] ED for abdominal pain. Workup was found to be significant for CT imaging demonstrating acute appendicitis. Informed consent was obtained and documented, and patient underwent laparoscopic appendectomy Burt Knack, 11/25/2017), at which time gangrenous appendicitis with perforation was recognized.  Post-operatively, patient's pain improved/resolved and advancement of patient's diet and ambulation were well-tolerated. Patient remained inpatient for 48 hours IV antibiotics and pain control. The remainder of patient's hospital course was essentially unremarkable, and discharge planning was initiated accordingly with patient safely able to be discharged home with appropriate discharge instructions, antibiotics, pain control, and outpatient surgical follow-up after all of his and family's questions were answered to their expressed satisfaction.  Consults: None  Significant Diagnostic Studies: radiology: CT scan: acute appendicitis  Treatments: IV hydration, antibiotics: Zosyn and surgery: laparoscopic appendectomy Burt Knack, 11/25/2017)  Discharge Exam: Blood pressure 122/61, pulse 73, temperature 98.5 F (36.9 C), temperature source Oral, resp. rate 16, height 5\' 8"  (1.727 m), weight 227 lb (103 kg), SpO2 95 %. General appearance: alert, cooperative and no distress GI: abdomen soft and non-distended with mild peri-incisional tenderness to palpation without surrounding erythema or drainage  Disposition:    Allergies as of 11/28/2017      Reactions   Paroxetine    REACTION: paranoia, confusion   Sertraline Hcl    REACTION: paranoia, confusion      Medication List     TAKE these medications   HYDROcodone-acetaminophen 5-325 MG tablet Commonly known as:  NORCO/VICODIN Take 1-2 tablets by mouth every 4 (four) hours as needed for severe pain.   lisinopril 5 MG tablet Commonly known as:  PRINIVIL,ZESTRIL Take 5 mg by mouth daily.     ASK your doctor about these medications   amoxicillin-clavulanate 875-125 MG tablet Commonly known as:  AUGMENTIN Take 1 tablet by mouth 2 (two) times daily for 7 days. Ask about: Should I take this medication?      Follow-up Information    Vickie Epley, MD. Go on 12/04/2017.   Specialty:  General Surgery Why:  @9 :30a Contact information: [place] [phone]           Signed: Vickie Epley 12/07/2017, 4:11 AM

## 2018-02-04 ENCOUNTER — Other Ambulatory Visit: Payer: Self-pay | Admitting: Internal Medicine

## 2018-03-09 ENCOUNTER — Other Ambulatory Visit: Payer: Self-pay | Admitting: Internal Medicine

## 2018-12-31 ENCOUNTER — Encounter: Payer: Self-pay | Admitting: Primary Care

## 2018-12-31 ENCOUNTER — Ambulatory Visit (INDEPENDENT_AMBULATORY_CARE_PROVIDER_SITE_OTHER): Payer: Managed Care, Other (non HMO) | Admitting: Primary Care

## 2018-12-31 ENCOUNTER — Other Ambulatory Visit: Payer: Managed Care, Other (non HMO)

## 2018-12-31 ENCOUNTER — Telehealth: Payer: Self-pay

## 2018-12-31 VITALS — Wt 228.0 lb

## 2018-12-31 DIAGNOSIS — R350 Frequency of micturition: Secondary | ICD-10-CM | POA: Diagnosis not present

## 2018-12-31 LAB — POC URINALSYSI DIPSTICK (AUTOMATED)
Bilirubin, UA: NEGATIVE
Glucose, UA: NEGATIVE
Ketones, UA: NEGATIVE
Nitrite, UA: NEGATIVE
Protein, UA: NEGATIVE
Spec Grav, UA: >=1.03 — AB (ref 1.010–1.025)
Urobilinogen, UA: 0.2 E.U./dL
pH, UA: 5.5 (ref 5.0–8.0)

## 2018-12-31 MED ORDER — CIPROFLOXACIN HCL 500 MG PO TABS: 500.0000 mg | ORAL_TABLET | Freq: Two times a day (BID) | ORAL | 0 refills | Status: DC

## 2018-12-31 NOTE — Progress Notes (Signed)
Subjective:    Patient ID: Ryan Hatfield, male    DOB: 1965/09/11, 53 y.o.   MRN: [id number]  HPI  Virtual Visit via Video Note  I connected with Ryan Hatfield on 12/31/18 at 11:20 AM EDT by a video enabled telemedicine application and verified that I am speaking with the correct person using two identifiers.  Location: Patient: Home Provider: Office   I discussed the limitations of evaluation and management by telemedicine and the availability of in person appointments. The patient expressed understanding and agreed to proceed.  History of Present Illness:  [name] is a 53 year old male with a history of alcohol abuse, prostatitis, seizure disorder, carpal tunnel syndrome, benign brain tumor who presents today with a chief complaint of urinary frequency.  He also reports fever (99.7), headache, fatigue, dysuria, urgency. He denies hematuria, difficulty urinating, pelvic pain, flank pain, groin pain. He's never had a urinary tract infection. He's had prostatitis in the past but this doesn't feel similar. He's taken Tylenol for his fever this morning. He drinks little water during the day, mostly coffee.   Symptoms began yesterday and given symptoms he did not work last night and doesn't plan on working tonight. He is needing a work excuse.   Observations/Objective:  Alert and oriented. Appears well, not sickly. No distress. Speaking in complete sentences.   Assessment and Plan:  Symptoms for the last 24 hours. Suspicious for UTI vs prostatitis. Symptoms are different from when he had prostatitis years ago. This feels different.  UA today with moderate leuks, 1+ blood, no nitrites. Culture sent. Will start with Cipro treatment to cover for potential prostatitis.  Rx for Cipro 500 mg BID x 5 days sent to pharmacy. CMP from 11/2017 reviewed and is with normal renal function.  Discussed to push intake of water. Strict return precautions provided. He will update,  and if no better we will need to consider PSA and prostatitis work up.  Follow Up Instructions:  Start ciprofloxacin 500 mg tablets for infection. Take 1 tablet by mouth twice daily for 5 days.  Increase your intake of water to at least 5 bottles daily.  Please call us Monday next week if no improvement in your symptoms, or if your symptoms return.  It was a pleasure meeting you!    I discussed the assessment and treatment plan with the patient. The patient was provided an opportunity to ask questions and all were answered. The patient agreed with the plan and demonstrated an understanding of the instructions.   The patient was advised to call back or seek an in-person evaluation if the symptoms worsen or if the condition fails to improve as anticipated.     Pleas Koch, NP    Review of Systems  Constitutional: Positive for fatigue and fever.  Gastrointestinal: Negative for abdominal pain.  Genitourinary: Positive for dysuria, frequency and urgency. Negative for difficulty urinating, flank pain, hematuria, penile pain and testicular pain.       Past Medical History:  Diagnosis Date  . Acute appendicitis   . Alcohol abuse, in remission 05/29/2015  . Anxiety and depression 05/29/2015  . Arthritis, degenerative 02/03/2013   Overview:  Of right middle finger MCP joint   . Cannot sleep 02/03/2013  . Chest pain 01/15/2013  . Hypertension      Social History   Socioeconomic History  . Marital status: Divorced    Spouse name: Not on file  . Number of children: Not on file  .  Years of education: Not on file  . Highest education level: Not on file  Occupational History  . Not on file  Social Needs  . Financial resource strain: Not on file  . Food insecurity    Worry: Not on file    Inability: Not on file  . Transportation needs    Medical: Not on file    Non-medical: Not on file  Tobacco Use  . Smoking status: Current Every Day Smoker    Packs/day: 1.00     Years: 40.00    Pack years: 40.00  . Smokeless tobacco: Never Used  Substance and Sexual Activity  . Alcohol use: No    Comment: last drink x 3 days ago  . Drug use: No  . Sexual activity: Yes  Lifestyle  . Physical activity    Days per week: Not on file    Minutes per session: Not on file  . Stress: Not on file  Relationships  . Social Herbalist on phone: Not on file    Gets together: Not on file    Attends religious service: Not on file    Active member of club or organization: Not on file    Attends meetings of clubs or organizations: Not on file    Relationship status: Not on file  . Intimate partner violence    Fear of current or ex partner: Not on file    Emotionally abused: Not on file    Physically abused: Not on file    Forced sexual activity: Not on file  Other Topics Concern  . Not on file  Social History Narrative  . Not on file    Past Surgical History:  Procedure Laterality Date  . LAPAROSCOPIC APPENDECTOMY N/A 11/25/2017   Procedure: APPENDECTOMY LAPAROSCOPIC;  Surgeon: Florene Glen, MD;  Location: [facility] ORS;  Service: General;  Laterality: N/A;  . SEPTOPLASTY    . TONSILLECTOMY      Family History  Problem Relation Age of Onset  . Heart disease Mother   . Arthritis Mother   . Heart disease Father   . Diabetes Brother   . Stroke Brother   . Cancer Maternal Grandmother   . Kidney cancer Paternal Uncle   . Prostate cancer Paternal Grandfather     Allergies  Allergen Reactions  . Paroxetine     REACTION: paranoia, confusion  . Sertraline Hcl     REACTION: paranoia, confusion    Current Outpatient Medications on File Prior to Visit  Medication Sig Dispense Refill  . HYDROcodone-acetaminophen (NORCO/VICODIN) 5-325 MG tablet Take 1-2 tablets by mouth every 4 (four) hours as needed for severe pain. 30 tablet 0  . lisinopril (PRINIVIL,ZESTRIL) 5 MG tablet Take 5 mg by mouth daily.     . meloxicam (MOBIC) 15 MG tablet TAKE ONE TABLET  BY MOUTH EVERY DAY 30 tablet 0   No current facility-administered medications on file prior to visit.     Wt 228 lb (103.4 kg)   BMI 33.67 kg/m    Objective:   Physical Exam  Constitutional: He is oriented to person, place, and time. He appears well-nourished. He does not have a sickly appearance. He does not appear ill. No distress.  Respiratory: Effort normal.  Neurological: He is alert and oriented to person, place, and time.  Psychiatric: He has a normal mood and affect.           Assessment & Plan:

## 2018-12-31 NOTE — Telephone Encounter (Signed)
Noted, seen by Allie Bossier, NP-C

## 2018-12-31 NOTE — Telephone Encounter (Signed)
Pt said voiding q 64mins small amts and burning upon urination. Pt has smokers cough and SOB also H/A. No other covid symptoms,no travel and no known exposure to covid. Pt will bring urine specimen to [facility] at 10:15. FYI to Jory Ee NP and Vallarie Mare CMA.

## 2018-12-31 NOTE — Assessment & Plan Note (Signed)
Symptoms for the last 24 hours. Suspicious for UTI vs prostatitis. Symptoms are different from when he had prostatitis years ago. This feels different.  UA today with moderate leuks, 1+ blood, no nitrites. Culture sent. Will start with Cipro treatment to cover for potential prostatitis.  Rx for Cipro 500 mg BID x 5 days sent to pharmacy. CMP from 11/2017 reviewed and is with normal renal function.  Discussed to push intake of water. Strict return precautions provided. He will update, and if no better we will need to consider PSA and prostatitis work up.

## 2018-12-31 NOTE — Patient Instructions (Signed)
Start ciprofloxacin 500 mg tablets for infection. Take 1 tablet by mouth twice daily for 5 days.  Increase your intake of water to at least 5 bottles daily.  Please call us Monday next week if no improvement in your symptoms, or if your symptoms return.  It was a pleasure meeting you!

## 2019-01-04 ENCOUNTER — Other Ambulatory Visit: Payer: Self-pay | Admitting: Primary Care

## 2019-01-04 DIAGNOSIS — N3001 Acute cystitis with hematuria: Secondary | ICD-10-CM

## 2019-01-04 LAB — URINE CULTURE
MICRO NUMBER:: 611981
SPECIMEN QUALITY:: ADEQUATE

## 2019-01-04 MED ORDER — SULFAMETHOXAZOLE-TRIMETHOPRIM 800-160 MG PO TABS: 1.0000 | ORAL_TABLET | Freq: Two times a day (BID) | ORAL | 0 refills | Status: DC

## 2019-01-06 ENCOUNTER — Encounter: Payer: Self-pay | Admitting: Primary Care

## 2019-01-20 ENCOUNTER — Telehealth: Payer: Self-pay

## 2019-01-20 NOTE — Telephone Encounter (Signed)
noted 

## 2019-01-20 NOTE — Telephone Encounter (Signed)
Pt left v/m has ear infection; I spoke with pt and went swimming last wk and has had rt earache for 2 days but pain is worsening. Pt has temp 100; no other covid symptoms, no travel and no known exposure to covid. Since pt has temp cannot bring in office so pt will go to an UC(pt will decide which UC he will go to). FYI to Avie Echevaria NP.

## 2019-08-15 ENCOUNTER — Emergency Department: Payer: 59

## 2019-08-15 ENCOUNTER — Other Ambulatory Visit: Payer: Self-pay

## 2019-08-15 ENCOUNTER — Emergency Department
Admission: EM | Admit: 2019-08-15 | Discharge: 2019-08-15 | Disposition: A | Discharge status: Home / Self Care | Payer: 59 | Attending: Emergency Medicine | Admitting: Emergency Medicine

## 2019-08-15 ENCOUNTER — Encounter: Payer: Self-pay | Admitting: Emergency Medicine

## 2019-08-15 ENCOUNTER — Telehealth: Payer: Self-pay

## 2019-08-15 DIAGNOSIS — I10 Essential (primary) hypertension: Secondary | ICD-10-CM | POA: Insufficient documentation

## 2019-08-15 DIAGNOSIS — Z79899 Other long term (current) drug therapy: Secondary | ICD-10-CM | POA: Insufficient documentation

## 2019-08-15 DIAGNOSIS — R0602 Shortness of breath: Secondary | ICD-10-CM

## 2019-08-15 DIAGNOSIS — Z20822 Contact with and (suspected) exposure to covid-19: Secondary | ICD-10-CM | POA: Insufficient documentation

## 2019-08-15 DIAGNOSIS — F17210 Nicotine dependence, cigarettes, uncomplicated: Secondary | ICD-10-CM | POA: Insufficient documentation

## 2019-08-15 DIAGNOSIS — J441 Chronic obstructive pulmonary disease with (acute) exacerbation: Secondary | ICD-10-CM | POA: Insufficient documentation

## 2019-08-15 LAB — BASIC METABOLIC PANEL
Anion gap: 10 (ref 5–15)
BUN: 15 mg/dL (ref 6–20)
CO2: 28 mmol/L (ref 22–32)
Calcium: 8.9 mg/dL (ref 8.9–10.3)
Chloride: 101 mmol/L (ref 98–111)
Creatinine, Ser: 0.94 mg/dL (ref 0.61–1.24)
GFR calc Af Amer: >60 mL/min (ref >60)
GFR calc non Af Amer: >60 mL/min (ref >60)
Glucose, Bld: 113 mg/dL — ABNORMAL HIGH (ref 70–99)
Potassium: 3.8 mmol/L (ref 3.5–5.1)
Sodium: 139 mmol/L (ref 135–145)

## 2019-08-15 LAB — CBC
HCT: 46.9 % (ref 39.0–52.0)
Hemoglobin: 15.6 g/dL (ref 13.0–17.0)
MCH: 30.5 pg (ref 26.0–34.0)
MCHC: 33.3 g/dL (ref 30.0–36.0)
MCV: 91.8 fL (ref 80.0–100.0)
Platelets: 248 10*3/uL (ref 150–400)
RBC: 5.11 MIL/uL (ref 4.22–5.81)
RDW: 13.3 % (ref 11.5–15.5)
WBC: 12 10*3/uL — ABNORMAL HIGH (ref 4.0–10.5)
nRBC: 0 % (ref 0.0–0.2)

## 2019-08-15 LAB — TROPONIN I (HIGH SENSITIVITY): Troponin I (High Sensitivity): 6 ng/L (ref <18)

## 2019-08-15 LAB — BRAIN NATRIURETIC PEPTIDE: B Natriuretic Peptide: 32 pg/mL (ref 0.0–100.0)

## 2019-08-15 MED ORDER — IPRATROPIUM-ALBUTEROL 0.5-2.5 (3) MG/3ML IN SOLN
9.0000 mL | Freq: Once | RESPIRATORY_TRACT | Status: AC
  Administered 2019-08-15: 9 mL via RESPIRATORY_TRACT
  Filled 2019-08-15: qty 3

## 2019-08-15 MED ORDER — DOXYCYCLINE HYCLATE 100 MG PO CAPS: 100.0000 mg | ORAL_CAPSULE | Freq: Two times a day (BID) | ORAL | 0 refills | Status: AC

## 2019-08-15 MED ORDER — ALBUTEROL SULFATE (2.5 MG/3ML) 0.083% IN NEBU
2.5000 mg | INHALATION_SOLUTION | Freq: Once | RESPIRATORY_TRACT | Status: AC
  Administered 2019-08-15: 16:00:00 2.5 mg via RESPIRATORY_TRACT
  Filled 2019-08-15: qty 3

## 2019-08-15 MED ORDER — ALBUTEROL SULFATE HFA 108 (90 BASE) MCG/ACT IN AERS: 2.0000 | INHALATION_SPRAY | Freq: Four times a day (QID) | RESPIRATORY_TRACT | 0 refills | Status: DC | PRN

## 2019-08-15 MED ORDER — PREDNISONE 20 MG PO TABS: 60.0000 mg | ORAL_TABLET | Freq: Every day | ORAL | 0 refills | Status: AC

## 2019-08-15 MED ORDER — PREDNISONE 20 MG PO TABS
60.0000 mg | ORAL_TABLET | Freq: Once | ORAL | Status: AC
  Administered 2019-08-15: 60 mg via ORAL
  Filled 2019-08-15: qty 3

## 2019-08-15 NOTE — Telephone Encounter (Signed)
ER evaluation seems to be appropriate. Will await the results of that

## 2019-08-15 NOTE — ED Notes (Signed)
Pt ambulated per MD order, pt tolerated ambulation well with sats sitting around 90-91%. MD notified.

## 2019-08-15 NOTE — Telephone Encounter (Signed)
[facility] - Client TELEPHONE ADVICE RECORD [facility] Patient Name: MACKENZIE VALDIVIEZ Gender: Male DOB: 1965/12/31 Age: 54 Y 11 M 28 D Return Phone Number: [phone] (Primary) Address: City/State/Zip: [place] [address] Client [facility] - Client Client Site [place] - Day Physician Webb Silversmith - NP Contact Type Call Who Is Calling Patient / Member / Family / Caregiver Call Type Triage / Clinical Relationship To Patient Self Return Phone Number [phone] (Primary) Chief Complaint BREATHING - shortness of breath or sounds breathless Reason for Call Symptomatic / Request for [place] states he is feeling congestion and shortness of breath. No fever. Translation No Nurse Assessment Nurse: Zenia Resides, RN, Diane Date/Time Eilene Ghazi Time): 08/15/2019 10:12:40 AM Confirm and document reason for call. If symptomatic, describe symptoms. ---Caller states he is feeling congestion and shortness of breath on exertion. No fever. Has a moist cough. Also sob with coughing. Hard to sleep with the congestion- can't lay down. Symptoms for 5 days, but he's feeling a little better. COVID negative. Coughing up white/clear with a little yellow. Has the patient had close contact with a person known or suspected to have the novel coronavirus illness OR traveled / lives in area with major community spread (including international travel) in the last 14 days from the onset of symptoms? * If Asymptomatic, screen for exposure and travel within the last 14 days. ---No Does the patient have any new or worsening symptoms? ---Yes Will a triage be completed? ---Yes Related visit to physician within the last 2 weeks? ---No Does the PT have any chronic conditions? (i.e. diabetes, asthma, this includes High risk factors for pregnancy, etc.) ---Yes List chronic conditions. ---pre diabetic Is  this a behavioral health or substance abuse call? ---No Guidelines Guideline Title Affirmed Question Affirmed Notes Nurse Date/Time (Eastern Time) Coronavirus (COVID-19) - Diagnosed or Suspected MILD difficulty breathing (e.g., minimal/no SOB at rest, SOB with walking, pulse <100) Zenia Resides, RN, Diane 08/15/2019 10:16:26 AM PLEASE NOTE: All timestamps contained within this report are represented as [place] Standard Time. CONFIDENTIALTY NOTICE: This fax transmission is intended only for the addressee. It contains information that is legally privileged, confidential or otherwise protected from use or disclosure. If you are not the intended recipient, you are strictly prohibited from reviewing, disclosing, copying using or disseminating any of this information or taking any action in reliance on or regarding this information. If you have received this fax in error, please notify us immediately by telephone so that we can arrange for its return to [place]. Phone: [phone], Toll-Free: [phone], Fax: [phone] Page: 2 of 2 Call Id: [id number] [place]. Time Eilene Ghazi Time) Disposition Final User 08/15/2019 10:08:53 AM Send to Urgent Queue Beau Fanny 08/15/2019 10:19:12 AM Go to ED Now (or PCP triage) Yes Zenia Resides, RN, Diane Caller Disagree/Comply Comply Caller Understands Yes PreDisposition Call Doctor Care Advice Given Per Guideline GO TO ED NOW (OR PCP TRIAGE): * IF NO PCP (PRIMARY CARE PROVIDER) SECOND-LEVEL TRIAGE: You need to be seen within the next hour. Go to the [facility] at _____________ [place] as soon as you can. GENERAL CARE ADVICE FOR COVID-19 SYMPTOMS: * The treatment is the same whether you have COVID-19, influenza or some other respiratory virus. CARE ADVICE given per CORONAVIRUS (COVID-19) - DIAGNOSED OR SUSPECTED (Adult) guideline. NOTE TO TRIAGER - IF NO PCP, HAVE OTHER HCP RE-TRIAGE THE PATIENT, IF AVAILABLE: * During this COVID-19 pandemic, the medical community is trying to  prevent unnecessary referrals to the emergency department (ED). Some patients are fearful of being exposed to COVID-19 in a medical setting. Second-level triage (re-triage) by a physician has been shown to reduce ED referrals. Here are resources that may be available in your community. Referrals GO TO FACILITY UNDECIDED

## 2019-08-15 NOTE — ED Provider Notes (Signed)
[facility] Emergency Department Provider Note   ____________________________________________   First MD Initiated Contact with Patient 08/15/19 1339     (approximate)  I have reviewed the triage vital signs and the nursing notes.   HISTORY  Chief Complaint Shortness of Breath    HPI Ryan Hatfield is a 54 y.o. male with past medical history of hypertension and alcohol abuse who presents to the ED complaining of shortness of breath.  Patient reports that he has had 3 to 4 days of worsening difficulty breathing which has been associated with a cough productive of whitish sputum.  He states his breathing seems to be worse when he is exerting himself and he has been unable to go to work due to the symptoms.  He also complains of a feeling of tightness in his chest, but denies any overt pain.  He has noticed some swelling in his legs, but denies any calf pain.  He admits to smoking 1 pack/day cigarettes, but denies any history of COPD.        Past Medical History:  Diagnosis Date  . Acute appendicitis   . Alcohol abuse, in remission 05/29/2015  . Anxiety and depression 05/29/2015  . Arthritis, degenerative 02/03/2013   Overview:  Of right middle finger MCP joint   . Cannot sleep 02/03/2013  . Chest pain 01/15/2013  . Hypertension     Patient Active Problem List   Diagnosis Date Noted  . Urinary frequency 12/31/2018  . Benign neoplasm of brain ([place]) 10/26/2017  . Seizures ([place]) 10/26/2017  . Spells of trembling 10/26/2017  . Plantar fasciitis of left foot 01/17/2016  . Carpal tunnel syndrome 01/17/2016  . Bilateral hand pain 09/17/2015  . Numbness 09/17/2015  . Anxiety and depression 05/29/2015  . Alcohol abuse, in remission 05/29/2015  . Cannot sleep 02/03/2013  . Arthritis, degenerative 02/03/2013    Past Surgical History:  Procedure Laterality Date  . LAPAROSCOPIC APPENDECTOMY N/A 11/25/2017   Procedure: APPENDECTOMY LAPAROSCOPIC;   Surgeon: Florene Glen, MD;  Location: [facility] ORS;  Service: General;  Laterality: N/A;  . SEPTOPLASTY    . TONSILLECTOMY      Prior to Admission medications   Medication Sig Start Date End Date Taking? Authorizing Provider  cholecalciferol (VITAMIN D3) 25 MCG (1000 UNIT) tablet Take 1,000 Units by mouth daily.   Yes [provider]  lisinopril (PRINIVIL,ZESTRIL) 5 MG tablet Take 5 mg by mouth daily.  10/05/17 08/15/19 Yes [provider]  Multiple Vitamin (MULTIVITAMIN WITH MINERALS) TABS tablet Take 1 tablet by mouth daily.   Yes [provider]  Omega-3 Fatty Acids (FISH OIL) 1000 MG CAPS Take 1,000 mg by mouth daily.   Yes [provider]  zinc gluconate 50 MG tablet Take 50 mg by mouth daily.   Yes [provider]  albuterol (VENTOLIN HFA) 108 (90 Base) MCG/ACT inhaler Inhale 2 puffs into the lungs every 6 (six) hours as needed for wheezing or shortness of breath. 08/15/19   Blake Divine, MD  doxycycline (VIBRAMYCIN) 100 MG capsule Take 1 capsule (100 mg total) by mouth 2 (two) times daily for 7 days. 08/15/19 08/22/19  Blake Divine, MD  predniSONE (DELTASONE) 20 MG tablet Take 3 tablets (60 mg total) by mouth daily for 5 days. 08/15/19 08/20/19  Blake Divine, MD    Allergies Paroxetine and Sertraline hcl  Family History  Problem Relation Age of Onset  . Heart disease Mother   . Arthritis Mother   .  Heart disease Father   . Diabetes Brother   . Stroke Brother   . Cancer Maternal Grandmother   . Kidney cancer Paternal Uncle   . Prostate cancer Paternal Grandfather     Social History Social History   Tobacco Use  . Smoking status: Current Every Day Smoker    Packs/day: 1.00    Years: 40.00    Pack years: 40.00  . Smokeless tobacco: Never Used  Substance Use Topics  . Alcohol use: No    Comment: last drink x 3 days ago  . Drug use: No    Review of Systems  Constitutional: No fever/chills Eyes: No visual changes. ENT: No  sore throat. Cardiovascular: Positive for chest tightness. Respiratory: Positive for cough and shortness of breath. Gastrointestinal: No abdominal pain.  No nausea, no vomiting.  No diarrhea.  No constipation. Genitourinary: Negative for dysuria. Musculoskeletal: Negative for back pain. Skin: Negative for rash. Neurological: Negative for headaches, focal weakness or numbness.  ____________________________________________   PHYSICAL EXAM:  VITAL SIGNS: ED Triage Vitals  Enc Vitals Group     BP 08/15/19 1240 (!) 167/76     Pulse Rate 08/15/19 1239 99     Resp 08/15/19 1239 20     Temp 08/15/19 1239 98.4 F (36.9 C)     Temp Source 08/15/19 1239 Oral     SpO2 08/15/19 1239 93 %     Weight 08/15/19 1240 280 lb (127 kg)     Height 08/15/19 1240 5\' 10"  (1.778 m)     Head Circumference --      Peak Flow --      Pain Score 08/15/19 1240 2     Pain Loc --      Pain Edu? --      Excl. in [place]? --     Constitutional: Alert and oriented. Eyes: Conjunctivae are normal. Head: Atraumatic. Nose: No congestion/rhinnorhea. Mouth/Throat: Mucous membranes are moist. Neck: Normal ROM Cardiovascular: Normal rate, regular rhythm. Grossly normal heart sounds. Respiratory: Mild tachypnea with normal respiratory effort.  No retractions.  Expiratory wheezing noted throughout with prolonged expiratory phase. Gastrointestinal: Soft and nontender. No distention. Genitourinary: deferred Musculoskeletal: No lower extremity tenderness.  1+ pitting edema to bilateral lower extremities. Neurologic:  Normal speech and language. No gross focal neurologic deficits are appreciated. Skin:  Skin is warm, dry and intact. No rash noted. Psychiatric: Mood and affect are normal. Speech and behavior are normal.  ____________________________________________   LABS (all labs ordered are listed, but only abnormal results are displayed)  Labs Reviewed  BASIC METABOLIC PANEL - Abnormal; Notable for the following  components:      Result Value   Glucose, Bld 113 (*)    All other components within normal limits  CBC - Abnormal; Notable for the following components:   WBC 12.0 (*)    All other components within normal limits  NOVEL CORONAVIRUS, NAA (HOSP ORDER, SEND-OUT TO REF LAB; TAT 18-24 HRS)  BRAIN NATRIURETIC PEPTIDE  TROPONIN I (HIGH SENSITIVITY)  TROPONIN I (HIGH SENSITIVITY)   ____________________________________________  EKG  ED ECG REPORT I, Blake Divine, the attending physician, personally viewed and interpreted this ECG.   Date: 08/15/2019  EKG Time: 12:45  Rate: 82  Rhythm: normal sinus rhythm  Axis: Normal  Intervals:none  ST&T Change: None   PROCEDURES  Procedure(s) performed (including Critical Care):  Procedures   ____________________________________________   INITIAL IMPRESSION / ASSESSMENT AND PLAN / ED COURSE       54 year old male  with history of cigarette smoking presents to the ED with worsening productive cough as well as dyspnea on exertion and swelling in his legs for the past 3 to 4 days.  He is not in any respiratory distress but noted to be slightly tachypneic with significant expiratory wheezing.  Suspect newly diagnosed COPD as the etiology of his symptoms, will treat with duo nebs and steroids, he had a negative Covid test earlier this week.  CHF seems less likely as chest x-ray shows no pulmonary edema and BNP is within normal limits.  Remainder of labs are unremarkable, no acute ischemic changes on EKG and troponin within normal limits.  Patient reports feeling better following DuoNeb's x3 as well as dose of steroids, still has some expiratory wheezing but now is moving much better air.  He was given 1 additional albuterol, ambulate to ambulate on pulse ox and maintain O2 sats without significant shortness of breath.  He is requesting to be discharged home and we will start him on a steroid pulse as well as antibiotics given cough with purulent  sputum.  He was also prescribed an inhaler for use as needed, counseled to return to the ED for new worsening symptoms and otherwise follow-up with his PCP.  Patient agrees with plan.      ____________________________________________   FINAL CLINICAL IMPRESSION(S) / ED DIAGNOSES  Final diagnoses:  COPD exacerbation ([place])  Shortness of breath     ED Discharge Orders         Ordered    predniSONE (DELTASONE) 20 MG tablet  Daily     08/15/19 1701    albuterol (VENTOLIN HFA) 108 (90 Base) MCG/ACT inhaler  Every 6 hours PRN    Note to Pharmacy: Please supply with spacer   08/15/19 1701    doxycycline (VIBRAMYCIN) 100 MG capsule  2 times daily     08/15/19 1701           Note:  This document was prepared using Dragon voice recognition software and may include unintentional dictation errors.   Blake Divine, MD 08/15/19 [phone]

## 2019-08-15 NOTE — Telephone Encounter (Signed)
I spoke with pt and he is SOB & cannot walk right now(due to SOB) because he had to go next door and help his neighbor get his truck out of (ditch) and or yard. Pt refused 911 and I offered to call someone to come pick pt up but he said he would call someone to take him to ED now. Avie Echevaria NP out of office until 08/17/19 and may not be cking in box. Will send note to Dr Silvio Pate who is in office.

## 2019-08-15 NOTE — ED Triage Notes (Signed)
Here for [facility] over last few months worse with exertion.  + orthopnea.  Will get swelling in legs. No dx of CHF.  Gets pain between shoulder blades.  SHOB tends to ease once rests.  +DOE.  VSS at this time. No fevers.  + smoker.

## 2019-08-16 LAB — NOVEL CORONAVIRUS, NAA (HOSP ORDER, SEND-OUT TO REF LAB; TAT 18-24 HRS): SARS-CoV-2, NAA: NOT DETECTED

## 2019-08-20 IMAGING — MR MR HEAD WO/W CM
12 series · 44 of 48 positions shown · IV contrast (multihance)
Comparison: None.

CLINICAL DATA: Benign neoplasm of brain.

Possible seizure 2 weeks ago.
EXAM:
MRI HEAD WITHOUT AND WITH CONTRAST
TECHNIQUE: Multiplanar, multiecho pulse sequences of the brain and surrounding
structures were obtained without and with intravenous contrast.
CONTRAST:  20mL MULTIHANCE GADOBENATE DIMEGLUMINE 529 MG/ML IV SOLN

[Series 2: T1 · sagittal · 5.0mm · 0.45mm/px · 2 of 23 slices shown (1 of 2)]
[im 1/23]
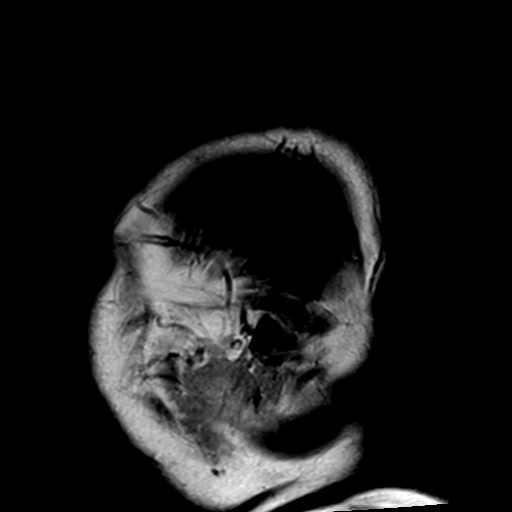
[im 23/23]
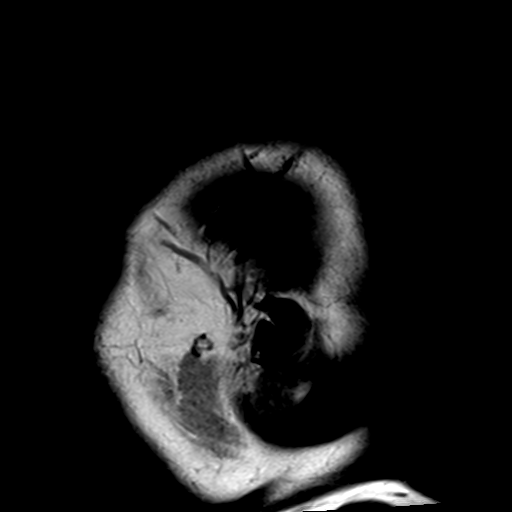

[Series 4: DWI · axial · 3.0mm · 1.20mm/px · z∈[-26,+136]mm · 4 of 55 slices shown (1 of 2)]
[im 1/55]
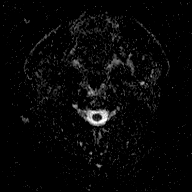
[im 19/55]
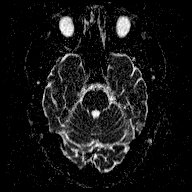
[im 37/55]
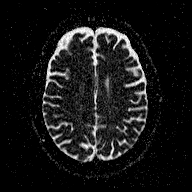
[im 55/55]
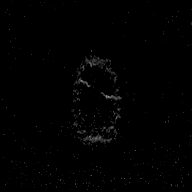

[Series 5: T2 · axial · 5.0mm · 0.72mm/px · z∈[-29,+139]mm · 2 of 25 slices shown (1 of 4)]
[im 1/25]
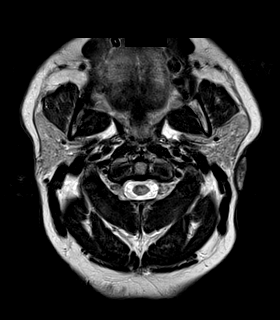
[im 25/25]
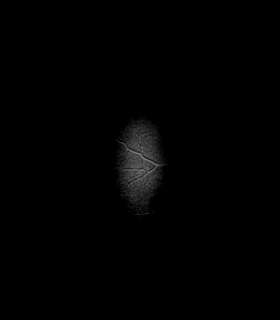

[Series 6: FLAIR · axial · 3.0mm · 0.45mm/px · z∈[-26,+136]mm · 4 of 55 slices shown]
[im 1/55]
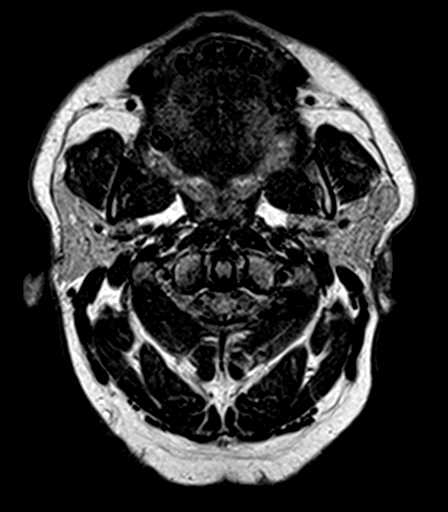
[im 19/55]
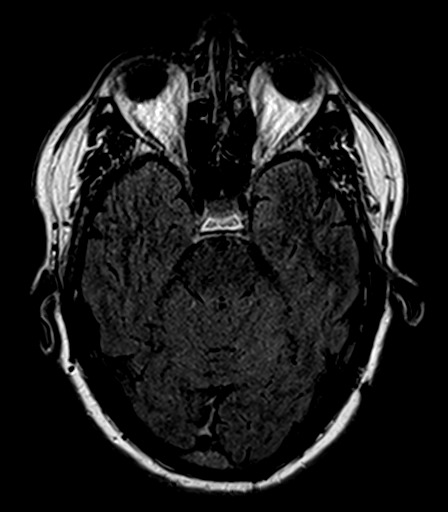
[im 37/55]
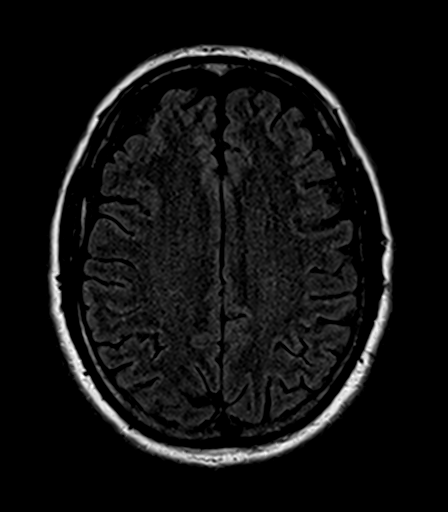
[im 55/55]
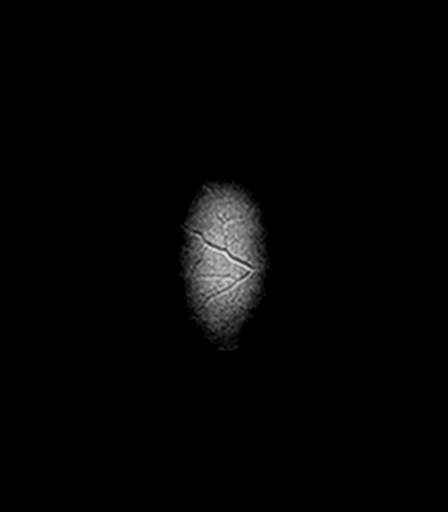

[Series 7: T2 · axial · 5.0mm · 0.72mm/px · z∈[-29,+139]mm · 2 of 25 slices shown (2 of 4)]
[im 1/25]
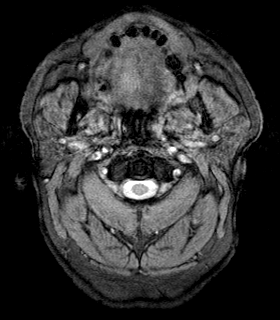
[im 25/25]
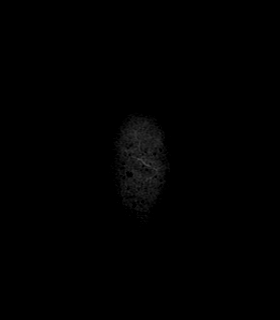

[Series 8: T1 · axial · 1.0mm · 1.00mm/px · z∈[-24,+103]mm · 7 of 160 slices shown (2 of 2)]
[im 1/160]
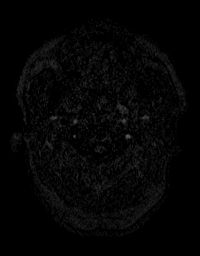
[im 32/160]
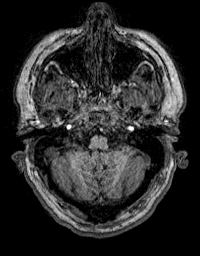
[im 48/160]
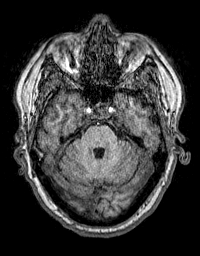
[im 64/160]
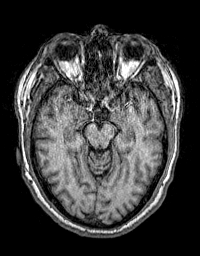
[im 96/160]
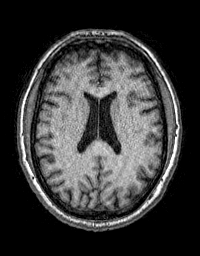
[im 112/160]
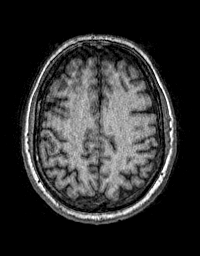
[im 128/160]
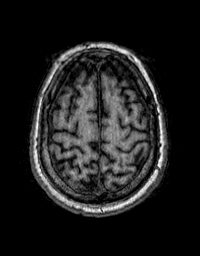

[Series 9: T2 · coronal · 3.0mm · 0.47mm/px · 2 of 35 slices shown (3 of 4)]
[im 1/35]
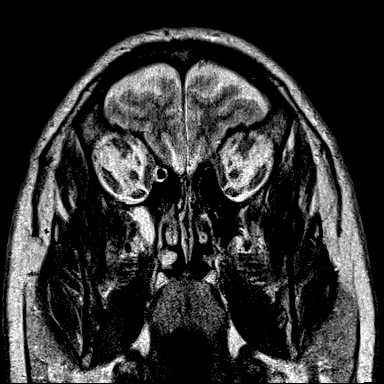
[im 35/35]
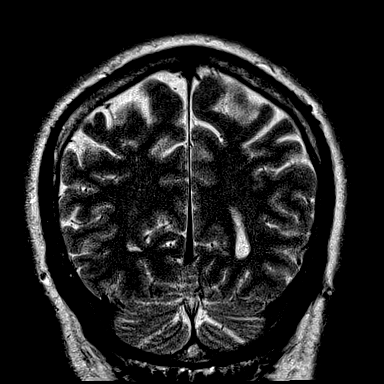

[Series 10: T2 · coronal · 5.0mm · 0.45mm/px · 2 of 33 slices shown (4 of 4)]
[im 1/33]
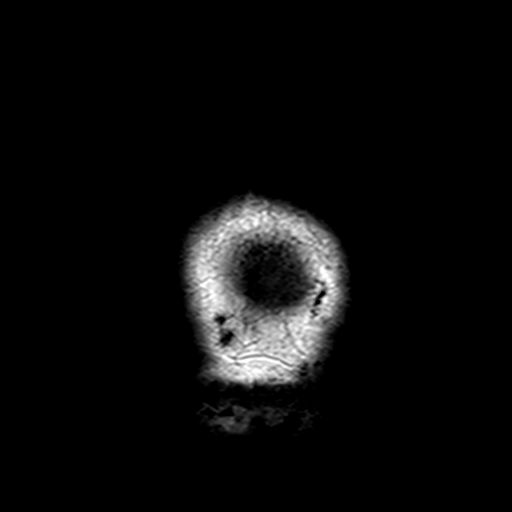
[im 33/33]
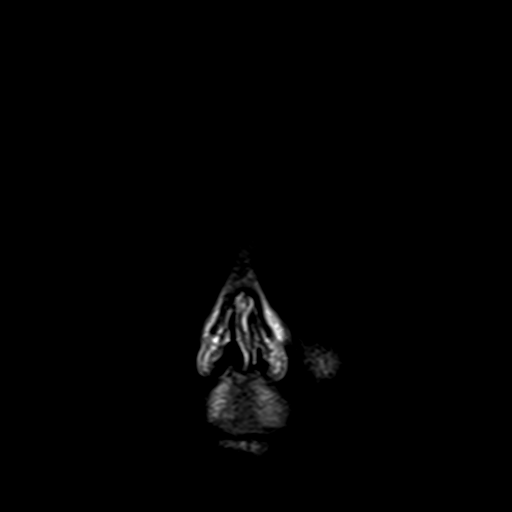

[Series 11: T1 post-contrast · axial · 1.0mm · 1.00mm/px · z∈[-24,+135]mm · 11 of 160 slices shown (1 of 3)]
[im 1/160]
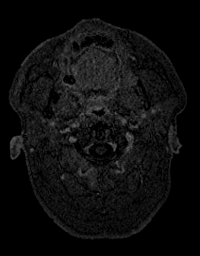
[im 16/160]
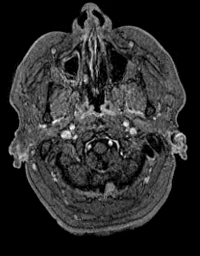
[im 32/160]
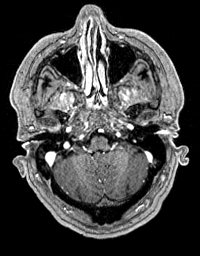
[im 48/160]
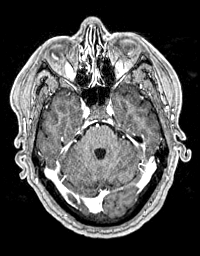
[im 64/160]
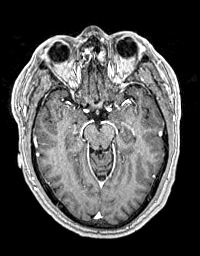
[im 80/160]
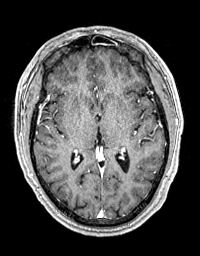
[im 96/160]
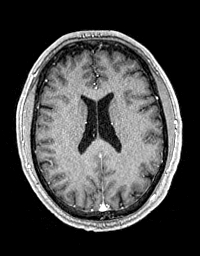
[im 112/160]
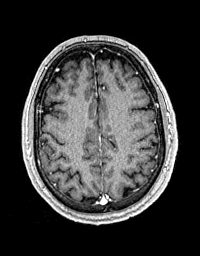
[im 128/160]
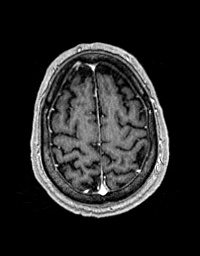
[im 144/160]
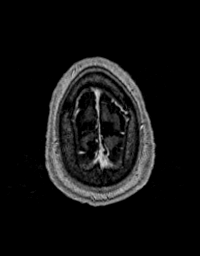
[im 160/160]
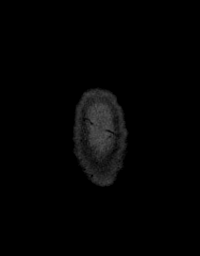

[Series 12: T1 post-contrast · coronal · 5.0mm · 0.45mm/px · 2 of 33 slices shown (2 of 3)]
[im 1/33]
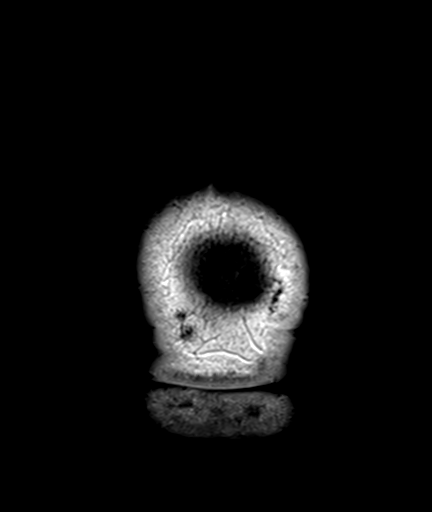
[im 33/33]
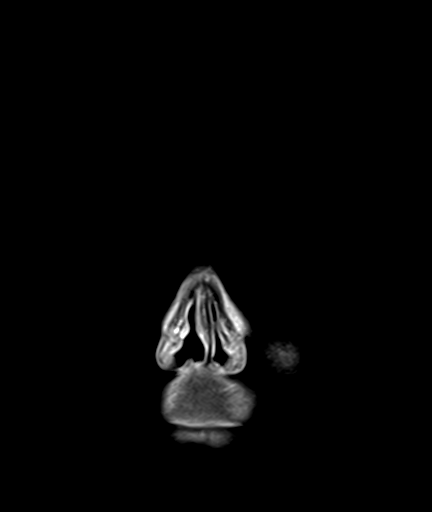

[Series 13: T1 post-contrast · sagittal · 5.0mm · 0.45mm/px · 2 of 23 slices shown (3 of 3)]
[im 1/23]
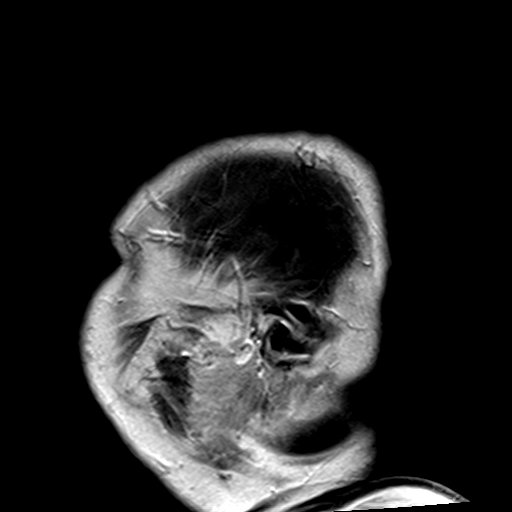
[im 23/23]
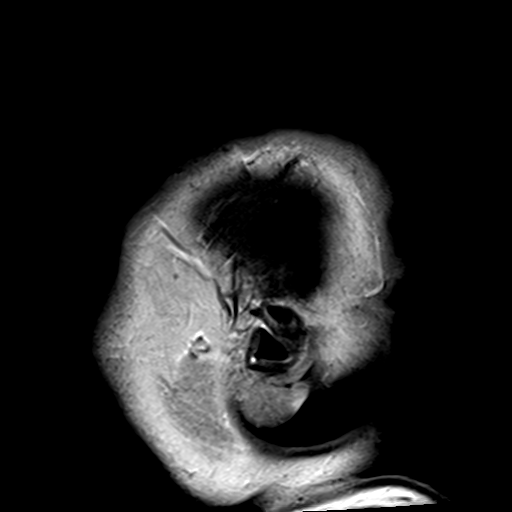

[Series 100: DWI · axial · 3.0mm · 1.20mm/px · z∈[-26,+136]mm · 4 of 55 slices shown (2 of 2)]
[im 1/55]
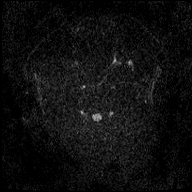
[im 19/55]
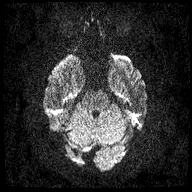
[im 37/55]
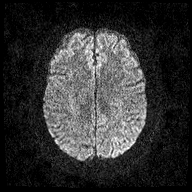
[im 55/55]
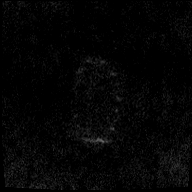

[44 of 48 positions shown; findings below may reference images not displayed]

FINDINGS: Brain: No cortical finding to explain seizure. Symmetric normal
hippocampus signal and bulk. No infarct, hydrocephalus, mass,
collection, or blood products. No abnormal intracranial enhancement.

Vascular: Major flow voids and vascular enhancements are preserved.

Skull and upper cervical spine: Negative for marrow lesion.

Sinuses/Orbits: Mild mucosal thickening in the frontal and ethmoid
sinuses. Small retention cyst in the right maxillary sinus.
IMPRESSION: Negative brain MRI.  No visible cause for seizure.

## 2019-08-30 ENCOUNTER — Other Ambulatory Visit: Payer: Self-pay

## 2019-08-30 ENCOUNTER — Encounter: Payer: Self-pay | Admitting: Internal Medicine

## 2019-08-30 ENCOUNTER — Ambulatory Visit: Payer: 59 | Admitting: Internal Medicine

## 2019-08-30 VITALS — BP 144/84 | HR 90 | Temp 98.3°F | Wt 279.0 lb

## 2019-08-30 DIAGNOSIS — I10 Essential (primary) hypertension: Secondary | ICD-10-CM

## 2019-08-30 DIAGNOSIS — R252 Cramp and spasm: Secondary | ICD-10-CM | POA: Diagnosis not present

## 2019-08-30 DIAGNOSIS — R400 Somnolence: Secondary | ICD-10-CM

## 2019-08-30 DIAGNOSIS — E782 Mixed hyperlipidemia: Secondary | ICD-10-CM

## 2019-08-30 DIAGNOSIS — R5383 Other fatigue: Secondary | ICD-10-CM

## 2019-08-30 DIAGNOSIS — Z6841 Body Mass Index (BMI) 40.0 and over, adult: Secondary | ICD-10-CM

## 2019-08-30 DIAGNOSIS — F17200 Nicotine dependence, unspecified, uncomplicated: Secondary | ICD-10-CM

## 2019-08-30 DIAGNOSIS — J449 Chronic obstructive pulmonary disease, unspecified: Secondary | ICD-10-CM | POA: Diagnosis not present

## 2019-08-30 DIAGNOSIS — R7989 Other specified abnormal findings of blood chemistry: Secondary | ICD-10-CM

## 2019-08-30 DIAGNOSIS — E559 Vitamin D deficiency, unspecified: Secondary | ICD-10-CM

## 2019-08-30 LAB — COMPREHENSIVE METABOLIC PANEL
ALT: 18 U/L (ref 0–53)
AST: 17 U/L (ref 0–37)
Albumin: 4.4 g/dL (ref 3.5–5.2)
Alkaline Phosphatase: 82 U/L (ref 39–117)
BUN: 13 mg/dL (ref 6–23)
CO2: 33 mEq/L — ABNORMAL HIGH (ref 19–32)
Calcium: 9.2 mg/dL (ref 8.4–10.5)
Chloride: 99 mEq/L (ref 96–112)
Creatinine, Ser: 1.12 mg/dL (ref 0.40–1.50)
GFR: 67.81 mL/min (ref >60.00)
Glucose, Bld: 86 mg/dL (ref 70–99)
Potassium: 4.4 mEq/L (ref 3.5–5.1)
Sodium: 140 mEq/L (ref 135–145)
Total Bilirubin: 0.5 mg/dL (ref 0.2–1.2)
Total Protein: 6.9 g/dL (ref 6.0–8.3)

## 2019-08-30 LAB — LIPID PANEL
Cholesterol: 204 mg/dL — ABNORMAL HIGH (ref 0–200)
HDL: 30.2 mg/dL — ABNORMAL LOW (ref >39.00)
LDL Cholesterol: 134 mg/dL — ABNORMAL HIGH (ref 0–99)
NonHDL: 173.52
Total CHOL/HDL Ratio: 7
Triglycerides: 200 mg/dL — ABNORMAL HIGH (ref 0.0–149.0)
VLDL: 40 mg/dL (ref 0.0–40.0)

## 2019-08-30 LAB — CBC
HCT: 45.6 % (ref 39.0–52.0)
Hemoglobin: 15 g/dL (ref 13.0–17.0)
MCHC: 32.9 g/dL (ref 30.0–36.0)
MCV: 92.7 fl (ref 78.0–100.0)
Platelets: 224 10*3/uL (ref 150.0–400.0)
RBC: 4.92 Mil/uL (ref 4.22–5.81)
RDW: 14.1 % (ref 11.5–15.5)
WBC: 13.1 10*3/uL — ABNORMAL HIGH (ref 4.0–10.5)

## 2019-08-30 LAB — VITAMIN B12: Vitamin B-12: 437 pg/mL (ref 211–911)

## 2019-08-30 LAB — TSH: TSH: 6.53 u[IU]/mL — ABNORMAL HIGH (ref 0.35–4.50)

## 2019-08-30 LAB — VITAMIN D 25 HYDROXY (VIT D DEFICIENCY, FRACTURES): VITD: 16.49 ng/mL — ABNORMAL LOW (ref 30.00–100.00)

## 2019-08-30 MED ORDER — TIOTROPIUM BROMIDE MONOHYDRATE 18 MCG IN CAPS: 18.0000 ug | ORAL_CAPSULE | Freq: Every day | RESPIRATORY_TRACT | 12 refills | Status: DC

## 2019-08-30 MED ORDER — LISINOPRIL-HYDROCHLOROTHIAZIDE 10-12.5 MG PO TABS: 1.0000 | ORAL_TABLET | Freq: Every day | ORAL | 0 refills | Status: DC

## 2019-08-30 NOTE — Patient Instructions (Signed)
I have sent you in Lisinopril HCT- for blood pressure and fluid retention. Consume a low salt, low carb, low fat diet and exercise for weight loss. Make an appt in 2 weeks for follow up.  I have sent in Spiriva to use once a day for COPD. You should be using your Albuterol less after starting this medication. Consider smoking cessation.  I have referred you to pulmonology for a sleep study and management of your COPD.

## 2019-08-30 NOTE — Progress Notes (Signed)
Subjective:    Patient ID: Ryan Hatfield, male    DOB: 01/04/1966, 54 y.o.   MRN: [id number]  HPI  Pt presents to the clinic today for ER followup. He went to the ER 2/8 for cough and SOB. Chest xray was negative for infiltrates or edema. ECG was normal. Labs revealed slightly elevated WBC, normal Troponin and normal BNP. He was treated with Albuterol, Prednisone and Doxycycline. He smokes 1 ppd since the age of 54 years old. Since discharge, he reports he is feeling better. He has a chronic cough, productive of yellow mucous. He reports chronic SOB with exertion, not at rest. He denies chest pain. He is using the Albuterol at least 2 x per day.   Of note, his BP today is 144/84. He is having frequent headaches and intermittent lightheadedness. He does have some vision changes but he has not seen an eye doctor lately. He denies eye pain,  floaters, double vision or curtains in his periphery. He has never been treated for HTN in the past. He has gained 50 lbs in the last few years.  He also reports extreme fatigue and daytime sleepiness. This has been an ongoing issue but seems worse with all the weight gain. He does not adhere to a diet or exercise program.  He also c/o generalized muscle cramps. This can occur during the day or at night. He does not drink as much water as he should.   Review of Systems  Past Medical History:  Diagnosis Date  . Acute appendicitis   . Alcohol abuse, in remission 05/29/2015  . Anxiety and depression 05/29/2015  . Arthritis, degenerative 02/03/2013   Overview:  Of right middle finger MCP joint   . Cannot sleep 02/03/2013  . Chest pain 01/15/2013  . Hypertension     Current Outpatient Medications  Medication Sig Dispense Refill  . albuterol (VENTOLIN HFA) 108 (90 Base) MCG/ACT inhaler Inhale 2 puffs into the lungs every 6 (six) hours as needed for wheezing or shortness of breath. 8 g 0  . cholecalciferol (VITAMIN D3) 25 MCG (1000 UNIT) tablet Take 1,000  Units by mouth daily.    Marland Kitchen lisinopril (PRINIVIL,ZESTRIL) 5 MG tablet Take 5 mg by mouth daily.     . Multiple Vitamin (MULTIVITAMIN WITH MINERALS) TABS tablet Take 1 tablet by mouth daily.    . Omega-3 Fatty Acids (FISH OIL) 1000 MG CAPS Take 1,000 mg by mouth daily.    Marland Kitchen zinc gluconate 50 MG tablet Take 50 mg by mouth daily.     No current facility-administered medications for this visit.    Allergies  Allergen Reactions  . Paroxetine     REACTION: paranoia, confusion  . Sertraline Hcl     REACTION: paranoia, confusion    Family History  Problem Relation Age of Onset  . Heart disease Mother   . Arthritis Mother   . Heart disease Father   . Diabetes Brother   . Stroke Brother   . Cancer Maternal Grandmother   . Kidney cancer Paternal Uncle   . Prostate cancer Paternal Grandfather     Social History   Socioeconomic History  . Marital status: Divorced    Spouse name: Not on file  . Number of children: Not on file  . Years of education: Not on file  . Highest education level: Not on file  Occupational History  . Not on file  Tobacco Use  . Smoking status: Current Every Day Smoker    Packs/day:  1.00    Years: 40.00    Pack years: 40.00  . Smokeless tobacco: Never Used  Substance and Sexual Activity  . Alcohol use: No    Comment: last drink x 3 days ago  . Drug use: No  . Sexual activity: Yes  Other Topics Concern  . Not on file  Social History Narrative  . Not on file   Social Determinants of Health   Financial Resource Strain:   . Difficulty of Paying Living Expenses: Not on file  Food Insecurity:   . Worried About Charity fundraiser in the Last Year: Not on file  . Ran Out of Food in the Last Year: Not on file  Transportation Needs:   . Lack of Transportation (Medical): Not on file  . Lack of Transportation (Non-Medical): Not on file  Physical Activity:   . Days of Exercise per Week: Not on file  . Minutes of Exercise per Session: Not on file    Stress:   . Feeling of Stress : Not on file  Social Connections:   . Frequency of Communication with Friends and Family: Not on file  . Frequency of Social Gatherings with Friends and Family: Not on file  . Attends Religious Services: Not on file  . Active Member of Clubs or Organizations: Not on file  . Attends Archivist Meetings: Not on file  . Marital Status: Not on file  Intimate Partner Violence:   . Fear of Current or Ex-Partner: Not on file  . Emotionally Abused: Not on file  . Physically Abused: Not on file  . Sexually Abused: Not on file     Constitutional: Pt reports fatigue, headaches and weight gain. Denies fever, malaise.  HEENT: Denies eye pain, eye redness, ear pain, ringing in the ears, wax buildup, runny nose, nasal congestion, bloody nose, or sore throat. Respiratory: Pt reports chronic cough and DOE. Denies difficulty breathing, or sputum production.   Cardiovascular: Denies chest pain, chest tightness, palpitations or swelling in the hands or feet.  Musculoskeletal: Pt reports generalized muscle cramps. Denies decrease in range of motion, difficulty with gait, or joint pain and swelling.  Skin: Denies redness, rashes, lesions or ulcercations.  Neurological: Pt reports intermittent lightheadedness. Denies dizziness, difficulty with memory, difficulty with speech or problems with balance and coordination.    No other specific complaints in a complete review of systems (except as listed in HPI above).     Objective:   Physical Exam  BP (!) 144/84   Pulse 90   Temp 98.3 F (36.8 C) (Temporal)   Wt 279 lb (126.6 kg)   SpO2 96%   BMI 40.03 kg/m   Wt Readings from Last 3 Encounters:  08/15/19 280 lb (127 kg)  12/31/18 228 lb (103.4 kg)  12/04/17 228 lb (103.4 kg)    General: Appears his stated age, obese, in NAD. HEENT: Head: normal shape and size; Eyes: sclera white, no icterus, conjunctiva pink, PERRLA and EOMs intact;  Neck:  Neck supple,  trachea midline. No masses, lumps or thyromegaly present.  Cardiovascular: Normal rate and rhythm. S1,S2 noted.  No murmur, rubs or gallops noted. 1+ pitting BLE edema.  Pulmonary/Chest: Normal effort and coarse vesicular breath sounds. No respiratory distress. No wheezes, rales or ronchi noted.  Musculoskeletal: No difficulty with gait.  Neurological: Alert and oriented. . Coordination normal.    BMET    Component Value Date/Time   NA 139 08/15/2019 1242   K 3.8 08/15/2019 1242  CL 101 08/15/2019 1242   CO2 28 08/15/2019 1242   GLUCOSE 113 (H) 08/15/2019 1242   BUN 15 08/15/2019 1242   CREATININE 0.94 08/15/2019 1242   CALCIUM 8.9 08/15/2019 1242   GFRNONAA >60 08/15/2019 1242   GFRAA >60 08/15/2019 1242    Lipid Panel     Component Value Date/Time   CHOL 168 09/06/2015 0831   TRIG 194 (H) 09/06/2015 0831   HDL 27 (L) 09/06/2015 0831   CHOLHDL 6.2 (H) 09/06/2015 0831   CHOLHDL 5 06/11/2015 0846   VLDL 51.4 (H) 06/11/2015 0846   LDLCALC 102 (H) 09/06/2015 0831    CBC    Component Value Date/Time   WBC 12.0 (H) 08/15/2019 1242   RBC 5.11 08/15/2019 1242   HGB 15.6 08/15/2019 1242   HCT 46.9 08/15/2019 1242   PLT 248 08/15/2019 1242   MCV 91.8 08/15/2019 1242   MCH 30.5 08/15/2019 1242   MCHC 33.3 08/15/2019 1242   RDW 13.3 08/15/2019 1242    Hgb A1C Lab Results  Component Value Date   HGBA1C 5.7 05/29/2015            Assessment & Plan:   ER Follow up for COPD Exacerbation, Smoker:  ER notes, labs and imaging reviewed Needs to be started on preventative therapy RX for Spiriva 18 mcg daily Advised him he should start using his Albuterol less Encouraged smoking cessation, he declines at this time Referral to pulmonology for further management.  Fatigue, Daytime Sleepiness, Obesity:  Concern for OSA Epworth Sleepiness Scale score of 12- scanned into chart Neck circumference- 21.5 inches Referral to pulmonology for sleep study Encouraged low  carb diet and exercise for weight loss.  CBC, TSH, Lipid, A1C, Vit D and B12 today  HTN:  Encouraged low salt diet, exercise for weight loss RX for Lisinopril HCT10-12.5 mg daily CMET today RTC in 2 weeks for BP check  Muscle Cramps:  CMET today Encouraged adequate water intake   Webb Silversmith, NP This visit occurred during the SARS-CoV-2 public health emergency.  Safety protocols were in place, including screening questions prior to the visit, additional usage of staff PPE, and extensive cleaning of exam room while observing appropriate contact time as indicated for disinfecting solutions.

## 2019-09-01 LAB — HEMOGLOBIN A1C: Hgb A1c MFr Bld: 5.9 % (ref 4.6–6.5)

## 2019-09-02 ENCOUNTER — Encounter: Payer: Self-pay | Admitting: Internal Medicine

## 2019-09-05 MED ORDER — LEVOTHYROXINE SODIUM 25 MCG PO TABS: 25.0000 ug | ORAL_TABLET | Freq: Every day | ORAL | 0 refills | Status: DC

## 2019-09-05 MED ORDER — ATORVASTATIN CALCIUM 10 MG PO TABS: 10.0000 mg | ORAL_TABLET | Freq: Every day | ORAL | 2 refills | Status: DC

## 2019-09-05 MED ORDER — VITAMIN D (ERGOCALCIFEROL) 1.25 MG (50000 UNIT) PO CAPS: 50000.0000 [IU] | ORAL_CAPSULE | ORAL | 0 refills | Status: DC

## 2019-09-05 NOTE — Addendum Note (Signed)
Addended by: Lurlean Nanny on: 09/05/2019 01:44 PM   Modules accepted: Orders

## 2019-09-13 ENCOUNTER — Ambulatory Visit: Payer: 59 | Admitting: Internal Medicine

## 2019-09-13 ENCOUNTER — Other Ambulatory Visit: Payer: Self-pay

## 2019-09-13 ENCOUNTER — Encounter: Payer: Self-pay | Admitting: Internal Medicine

## 2019-09-13 DIAGNOSIS — I10 Essential (primary) hypertension: Secondary | ICD-10-CM | POA: Diagnosis not present

## 2019-09-13 MED ORDER — FUROSEMIDE 20 MG PO TABS: 20.0000 mg | ORAL_TABLET | Freq: Every day | ORAL | 0 refills | Status: DC

## 2019-09-13 MED ORDER — LISINOPRIL 10 MG PO TABS: 10.0000 mg | ORAL_TABLET | Freq: Every day | ORAL | 0 refills | Status: DC

## 2019-09-13 NOTE — Patient Instructions (Signed)

## 2019-09-13 NOTE — Assessment & Plan Note (Signed)
Will continue Lisionpril HCT x 2 weeks so that he does not waste money or medication RX for Lisinopril 10 mg daily RX for Furosemide 20 mg daily Encouraged low salt diet, exercise for weight loss Elevating the legs can help reduce swelling  RTC in 1 month for BP check, BMET, TSH and Free T4

## 2019-09-13 NOTE — Progress Notes (Signed)
Subjective:    Patient ID: Ryan Hatfield, male    DOB: Mar 06, 1966, 54 y.o.   MRN: [id number]  HPI  Pt presents to the clinic today for 2 week follow up of HTN. At his last visit, he was started on Lisinopril HCT. He has been taking the medication as prescribed. He denies adverse side effects but has not noticed much improvement in his swelling. His BP today is 134/72. ECG from 08/2019 reviewed.   Review of Systems      Past Medical History:  Diagnosis Date  . Acute appendicitis   . Alcohol abuse, in remission 05/29/2015  . Anxiety and depression 05/29/2015  . Arthritis, degenerative 02/03/2013   Overview:  Of right middle finger MCP joint   . Cannot sleep 02/03/2013  . Chest pain 01/15/2013  . Hypertension     Current Outpatient Medications  Medication Sig Dispense Refill  . albuterol (VENTOLIN HFA) 108 (90 Base) MCG/ACT inhaler Inhale 2 puffs into the lungs every 6 (six) hours as needed for wheezing or shortness of breath. 8 g 0  . atorvastatin (LIPITOR) 10 MG tablet Take 1 tablet (10 mg total) by mouth daily. 30 tablet 2  . cholecalciferol (VITAMIN D3) 25 MCG (1000 UNIT) tablet Take 1,000 Units by mouth daily.    Marland Kitchen levothyroxine (SYNTHROID) 25 MCG tablet Take 1 tablet (25 mcg total) by mouth daily before breakfast. 30 tablet 0  . lisinopril-hydrochlorothiazide (ZESTORETIC) 10-12.5 MG tablet Take 1 tablet by mouth daily. 30 tablet 0  . Multiple Vitamin (MULTIVITAMIN WITH MINERALS) TABS tablet Take 1 tablet by mouth daily.    . Omega-3 Fatty Acids (FISH OIL) 1000 MG CAPS Take 1,000 mg by mouth daily.    Marland Kitchen tiotropium (SPIRIVA) 18 MCG inhalation capsule Place 1 capsule (18 mcg total) into inhaler and inhale daily. 30 capsule 12  . Vitamin D, Ergocalciferol, (DRISDOL) 1.25 MG (50000 UNIT) CAPS capsule Take 1 capsule (50,000 Units total) by mouth every 7 (seven) days. 12 capsule 0  . zinc gluconate 50 MG tablet Take 50 mg by mouth daily.     No current facility-administered  medications for this visit.    Allergies  Allergen Reactions  . Paroxetine     REACTION: paranoia, confusion  . Sertraline Hcl     REACTION: paranoia, confusion    Family History  Problem Relation Age of Onset  . Heart disease Mother   . Arthritis Mother   . Heart disease Father   . Diabetes Brother   . Stroke Brother   . Cancer Maternal Grandmother   . Kidney cancer Paternal Uncle   . Prostate cancer Paternal Grandfather     Social History   Socioeconomic History  . Marital status: Divorced    Spouse name: Not on file  . Number of children: Not on file  . Years of education: Not on file  . Highest education level: Not on file  Occupational History  . Not on file  Tobacco Use  . Smoking status: Current Every Day Smoker    Packs/day: 1.00    Years: 40.00    Pack years: 40.00  . Smokeless tobacco: Never Used  Substance and Sexual Activity  . Alcohol use: No    Comment: last drink x 3 days ago  . Drug use: No  . Sexual activity: Yes  Other Topics Concern  . Not on file  Social History Narrative  . Not on file   Social Determinants of Health   Financial Resource Strain:   .  Difficulty of Paying Living Expenses: Not on file  Food Insecurity:   . Worried About Charity fundraiser in the Last Year: Not on file  . Ran Out of Food in the Last Year: Not on file  Transportation Needs:   . Lack of Transportation (Medical): Not on file  . Lack of Transportation (Non-Medical): Not on file  Physical Activity:   . Days of Exercise per Week: Not on file  . Minutes of Exercise per Session: Not on file  Stress:   . Feeling of Stress : Not on file  Social Connections:   . Frequency of Communication with Friends and Family: Not on file  . Frequency of Social Gatherings with Friends and Family: Not on file  . Attends Religious Services: Not on file  . Active Member of Clubs or Organizations: Not on file  . Attends Archivist Meetings: Not on file  . Marital  Status: Not on file  Intimate Partner Violence:   . Fear of Current or Ex-Partner: Not on file  . Emotionally Abused: Not on file  . Physically Abused: Not on file  . Sexually Abused: Not on file     Constitutional: Denies fever, malaise, fatigue, headache or abrupt weight changes.  Respiratory: Denies difficulty breathing, shortness of breath, cough or sputum production.   Cardiovascular: Pt reports swelling in BLE. Denies chest pain, chest tightness, palpitations or swelling in the hands.  Neurological: Denies dizziness, difficulty with memory, difficulty with speech or problems with balance and coordination.    No other specific complaints in a complete review of systems (except as listed in HPI above).  Objective:   Physical Exam  BP 134/72   Pulse 82   Temp 98.1 F (36.7 C) (Temporal)   Wt 281 lb (127.5 kg)   SpO2 97%   BMI 40.32 kg/m   Wt Readings from Last 3 Encounters:  08/30/19 279 lb (126.6 kg)  08/15/19 280 lb (127 kg)  12/31/18 228 lb (103.4 kg)    General: Appears his stated age, obese, in NAD. Cardiovascular: Normal rate and rhythm. S1,S2 noted.  No murmur, rubs or gallops noted. 1+ BLE edema.  Pulmonary/Chest: Normal effort and positive vesicular breath sounds. No respiratory distress. No wheezes, rales or ronchi noted.  Neurological: Alert and oriented.    BMET    Component Value Date/Time   NA 140 08/30/2019 0914   K 4.4 08/30/2019 0914   CL 99 08/30/2019 0914   CO2 33 (H) 08/30/2019 0914   GLUCOSE 86 08/30/2019 0914   BUN 13 08/30/2019 0914   CREATININE 1.12 08/30/2019 0914   CALCIUM 9.2 08/30/2019 0914   GFRNONAA >60 08/15/2019 1242   GFRAA >60 08/15/2019 1242    Lipid Panel     Component Value Date/Time   CHOL 204 (H) 08/30/2019 0914   CHOL 168 09/06/2015 0831   TRIG 200.0 (H) 08/30/2019 0914   HDL 30.20 (L) 08/30/2019 0914   HDL 27 (L) 09/06/2015 0831   CHOLHDL 7 08/30/2019 0914   VLDL 40.0 08/30/2019 0914   LDLCALC 134 (H)  08/30/2019 0914   LDLCALC 102 (H) 09/06/2015 0831    CBC    Component Value Date/Time   WBC 13.1 (H) 08/30/2019 0914   RBC 4.92 08/30/2019 0914   HGB 15.0 08/30/2019 0914   HCT 45.6 08/30/2019 0914   PLT 224.0 08/30/2019 0914   MCV 92.7 08/30/2019 0914   MCH 30.5 08/15/2019 1242   MCHC 32.9 08/30/2019 0914   RDW  14.1 08/30/2019 0914    Hgb A1C Lab Results  Component Value Date   HGBA1C 5.9 08/30/2019        Assessment & Plan:   Webb Silversmith, NP This visit occurred during the SARS-CoV-2 public health emergency.  Safety protocols were in place, including screening questions prior to the visit, additional usage of staff PPE, and extensive cleaning of exam room while observing appropriate contact time as indicated for disinfecting solutions.

## 2019-09-21 ENCOUNTER — Other Ambulatory Visit: Payer: Self-pay | Admitting: Internal Medicine

## 2019-09-21 DIAGNOSIS — I10 Essential (primary) hypertension: Secondary | ICD-10-CM

## 2019-09-26 ENCOUNTER — Other Ambulatory Visit: Payer: Self-pay | Admitting: Internal Medicine

## 2019-09-26 DIAGNOSIS — I10 Essential (primary) hypertension: Secondary | ICD-10-CM

## 2019-09-29 ENCOUNTER — Other Ambulatory Visit: Payer: Self-pay | Admitting: Internal Medicine

## 2019-10-17 ENCOUNTER — Ambulatory Visit: Payer: 59 | Admitting: Internal Medicine

## 2019-10-17 ENCOUNTER — Other Ambulatory Visit: Payer: Self-pay

## 2019-10-17 ENCOUNTER — Encounter: Payer: Self-pay | Admitting: Internal Medicine

## 2019-10-17 VITALS — BP 128/68 | HR 83 | Temp 98.3°F | Wt 278.0 lb

## 2019-10-17 DIAGNOSIS — G4719 Other hypersomnia: Secondary | ICD-10-CM | POA: Diagnosis not present

## 2019-10-17 DIAGNOSIS — E782 Mixed hyperlipidemia: Secondary | ICD-10-CM | POA: Diagnosis not present

## 2019-10-17 DIAGNOSIS — J449 Chronic obstructive pulmonary disease, unspecified: Secondary | ICD-10-CM | POA: Insufficient documentation

## 2019-10-17 DIAGNOSIS — E662 Morbid (severe) obesity with alveolar hypoventilation: Secondary | ICD-10-CM

## 2019-10-17 DIAGNOSIS — Z6839 Body mass index (BMI) 39.0-39.9, adult: Secondary | ICD-10-CM

## 2019-10-17 DIAGNOSIS — E039 Hypothyroidism, unspecified: Secondary | ICD-10-CM | POA: Diagnosis not present

## 2019-10-17 DIAGNOSIS — R0683 Snoring: Secondary | ICD-10-CM

## 2019-10-17 DIAGNOSIS — I10 Essential (primary) hypertension: Secondary | ICD-10-CM

## 2019-10-17 LAB — COMPREHENSIVE METABOLIC PANEL
ALT: 13 U/L (ref 0–53)
AST: 16 U/L (ref 0–37)
Albumin: 4.3 g/dL (ref 3.5–5.2)
Alkaline Phosphatase: 74 U/L (ref 39–117)
BUN: 14 mg/dL (ref 6–23)
CO2: 30 mEq/L (ref 19–32)
Calcium: 9.2 mg/dL (ref 8.4–10.5)
Chloride: 103 mEq/L (ref 96–112)
Creatinine, Ser: 1.1 mg/dL (ref 0.40–1.50)
GFR: 69.2 mL/min (ref >60.00)
Glucose, Bld: 150 mg/dL — ABNORMAL HIGH (ref 70–99)
Potassium: 4.1 mEq/L (ref 3.5–5.1)
Sodium: 142 mEq/L (ref 135–145)
Total Bilirubin: 0.6 mg/dL (ref 0.2–1.2)
Total Protein: 6.7 g/dL (ref 6.0–8.3)

## 2019-10-17 LAB — T4, FREE: Free T4: 0.75 ng/dL (ref 0.60–1.60)

## 2019-10-17 LAB — TSH: TSH: 4.01 u[IU]/mL (ref 0.35–4.50)

## 2019-10-17 MED ORDER — LISINOPRIL 10 MG PO TABS: 10.0000 mg | ORAL_TABLET | Freq: Every day | ORAL | 1 refills | Status: DC

## 2019-10-17 MED ORDER — ALBUTEROL SULFATE HFA 108 (90 BASE) MCG/ACT IN AERS: 2.0000 | INHALATION_SPRAY | Freq: Four times a day (QID) | RESPIRATORY_TRACT | 0 refills | Status: DC | PRN

## 2019-10-17 MED ORDER — FUROSEMIDE 20 MG PO TABS: 20.0000 mg | ORAL_TABLET | Freq: Every day | ORAL | 1 refills | Status: DC

## 2019-10-17 NOTE — Assessment & Plan Note (Signed)
Improved Lisinopril and Furosemide refilled today BMET today Encouraged DASH diet and exercise for weight loss

## 2019-10-17 NOTE — Assessment & Plan Note (Signed)
TSH and free T4 today Will adjust Levothyroxine if needed based on labs 

## 2019-10-17 NOTE — Progress Notes (Signed)
Subjective:    Patient ID: Ryan Hatfield, male    DOB: [birth year], 54 y.o.   MRN: [id number]  HPI  Patient presents to the clinic today for follow-up of HTN.  At his last visit his Lisinopril HCT was changed to Lisinopril and Furosemide due to increased lower extremity edema.  He has been taking the medication as prescribed.  He denies adverse effects.  His BP today is 128/68.  ECG from 08/2019 reviewed.  He was also started on Levothyroxine for abnormal TSH.  He has been taking the medication as prescribed.  He denies adverse effects.  He is due for repeat thyroid studies today.  He is also concerned that he may have sleep apnea.  He reports he is able to fall asleep but is unable to stay asleep.  He does not feel rested when he wakes up.  He is tired throughout the day and often naps.  He does snore.  He has no family history of sleep apnea.  He would like a referral for sleep study today.  Review of Systems      Past Medical History:  Diagnosis Date  . Acute appendicitis   . Alcohol abuse, in remission 05/29/2015  . Anxiety and depression 05/29/2015  . Arthritis, degenerative 02/03/2013   Overview:  Of right middle finger MCP joint   . Cannot sleep 02/03/2013  . Chest pain 01/15/2013  . Hypertension     Current Outpatient Medications  Medication Sig Dispense Refill  . albuterol (VENTOLIN HFA) 108 (90 Base) MCG/ACT inhaler Inhale 2 puffs into the lungs every 6 (six) hours as needed for wheezing or shortness of breath. 8 g 0  . atorvastatin (LIPITOR) 10 MG tablet Take 1 tablet (10 mg total) by mouth daily. 30 tablet 2  . cholecalciferol (VITAMIN D3) 25 MCG (1000 UNIT) tablet Take 1,000 Units by mouth daily.    . furosemide (LASIX) 20 MG tablet Take 1 tablet (20 mg total) by mouth daily. 30 tablet 0  . levothyroxine (SYNTHROID) 25 MCG tablet TAKE 1 TABLET BY MOUTH DAILY BEFORE BREAKFAST. 30 tablet 0  . lisinopril (ZESTRIL) 10 MG tablet Take 1 tablet (10 mg total) by mouth daily.  30 tablet 0  . lisinopril-hydrochlorothiazide (ZESTORETIC) 10-12.5 MG tablet TAKE 1 TABLET BY MOUTH EVERY DAY 30 tablet 0  . Multiple Vitamin (MULTIVITAMIN WITH MINERALS) TABS tablet Take 1 tablet by mouth daily.    . Omega-3 Fatty Acids (FISH OIL) 1000 MG CAPS Take 1,000 mg by mouth daily.    Marland Kitchen tiotropium (SPIRIVA) 18 MCG inhalation capsule Place 1 capsule (18 mcg total) into inhaler and inhale daily. 30 capsule 12  . Vitamin D, Ergocalciferol, (DRISDOL) 1.25 MG (50000 UNIT) CAPS capsule Take 1 capsule (50,000 Units total) by mouth every 7 (seven) days. 12 capsule 0  . zinc gluconate 50 MG tablet Take 50 mg by mouth daily.     No current facility-administered medications for this visit.    Allergies  Allergen Reactions  . Paroxetine     REACTION: paranoia, confusion  . Sertraline Hcl     REACTION: paranoia, confusion    Family History  Problem Relation Age of Onset  . Heart disease Mother   . Arthritis Mother   . Heart disease Father   . Diabetes Brother   . Stroke Brother   . Cancer Maternal Grandmother   . Kidney cancer Paternal Uncle   . Prostate cancer Paternal Grandfather     Social History   Socioeconomic  History  . Marital status: Divorced    Spouse name: Not on file  . Number of children: Not on file  . Years of education: Not on file  . Highest education level: Not on file  Occupational History  . Not on file  Tobacco Use  . Smoking status: Current Every Day Smoker    Packs/day: 1.00    Years: 40.00    Pack years: 40.00  . Smokeless tobacco: Never Used  Substance and Sexual Activity  . Alcohol use: No    Comment: last drink x 3 days ago  . Drug use: No  . Sexual activity: Yes  Other Topics Concern  . Not on file  Social History Narrative  . Not on file   Social Determinants of Health   Financial Resource Strain:   . Difficulty of Paying Living Expenses:   Food Insecurity:   . Worried About Charity fundraiser in the Last Year:   . Academic librarian in the Last Year:   Transportation Needs:   . Film/video editor (Medical):   Marland Kitchen Lack of Transportation (Non-Medical):   Physical Activity:   . Days of Exercise per Week:   . Minutes of Exercise per Session:   Stress:   . Feeling of Stress :   Social Connections:   . Frequency of Communication with Friends and Family:   . Frequency of Social Gatherings with Friends and Family:   . Attends Religious Services:   . Active Member of Clubs or Organizations:   . Attends Archivist Meetings:   Marland Kitchen Marital Status:   Intimate Partner Violence:   . Fear of Current or Ex-Partner:   . Emotionally Abused:   Marland Kitchen Physically Abused:   . Sexually Abused:      Constitutional: Denies fever, malaise, fatigue, headache or abrupt weight changes.  Respiratory: Denies difficulty breathing, shortness of breath, cough or sputum production.   Cardiovascular: Patient reports swelling in legs.  Denies chest pain, chest tightness, palpitations or swelling in the hands.  Skin: Denies redness, rashes, lesions or ulcercations.  Neurological: Denies dizziness, difficulty with memory, difficulty with speech or problems with balance and coordination.    No other specific complaints in a complete review of systems (except as listed in HPI above).  Objective:   Physical Exam   BP 128/68   Pulse 83   Temp 98.3 F (36.8 C) (Temporal)   Wt 278 lb (126.1 kg)   SpO2 97%   BMI 39.89 kg/m   Wt Readings from Last 3 Encounters:  10/17/19 278 lb (126.1 kg)  09/13/19 281 lb (127.5 kg)  08/30/19 279 lb (126.6 kg)    General: Appears his stated age, obese, in NAD. Neck:  Neck supple, trachea midline. No masses, lumps present.  Cardiovascular: Normal rate and rhythm. S1,S2 noted.  No murmur, rubs or gallops noted.  Trace pitting BLE edema.  Pulmonary/Chest: Normal effort and positive vesicular breath sounds. No respiratory distress. No wheezes, rales or ronchi noted.  Neurological: Alert and  oriented.    BMET    Component Value Date/Time   NA 140 08/30/2019 0914   K 4.4 08/30/2019 0914   CL 99 08/30/2019 0914   CO2 33 (H) 08/30/2019 0914   GLUCOSE 86 08/30/2019 0914   BUN 13 08/30/2019 0914   CREATININE 1.12 08/30/2019 0914   CALCIUM 9.2 08/30/2019 0914   GFRNONAA >60 08/15/2019 1242   GFRAA >60 08/15/2019 1242    Lipid Panel  Component Value Date/Time   CHOL 204 (H) 08/30/2019 0914   CHOL 168 09/06/2015 0831   TRIG 200.0 (H) 08/30/2019 0914   HDL 30.20 (L) 08/30/2019 0914   HDL 27 (L) 09/06/2015 0831   CHOLHDL 7 08/30/2019 0914   VLDL 40.0 08/30/2019 0914   LDLCALC 134 (H) 08/30/2019 0914   LDLCALC 102 (H) 09/06/2015 0831    CBC    Component Value Date/Time   WBC 13.1 (H) 08/30/2019 0914   RBC 4.92 08/30/2019 0914   HGB 15.0 08/30/2019 0914   HCT 45.6 08/30/2019 0914   PLT 224.0 08/30/2019 0914   MCV 92.7 08/30/2019 0914   MCH 30.5 08/15/2019 1242   MCHC 32.9 08/30/2019 0914   RDW 14.1 08/30/2019 0914    Hgb A1C Lab Results  Component Value Date   HGBA1C 5.9 08/30/2019           Assessment & Plan:   Excessive Daytime Sleepiness, Snoring, Obesity:  Neck circumference: 20.5 in ESS score of Referral to pulmonology placed for sleep study per pt request  RTC in 2 months for lab only lipid, cmet and vit d   Webb Silversmith, NP This visit occurred during the SARS-CoV-2 public health emergency.  Safety protocols were in place, including screening questions prior to the visit, additional usage of staff PPE, and extensive cleaning of exam room while observing appropriate contact time as indicated for disinfecting solutions.

## 2019-10-17 NOTE — Patient Instructions (Signed)

## 2019-11-04 ENCOUNTER — Other Ambulatory Visit: Payer: Self-pay | Admitting: Internal Medicine

## 2019-11-19 ENCOUNTER — Other Ambulatory Visit: Payer: Self-pay | Admitting: Internal Medicine

## 2019-11-23 ENCOUNTER — Other Ambulatory Visit: Payer: Self-pay | Admitting: Internal Medicine

## 2019-11-23 DIAGNOSIS — E559 Vitamin D deficiency, unspecified: Secondary | ICD-10-CM

## 2019-11-29 ENCOUNTER — Other Ambulatory Visit: Payer: Self-pay | Admitting: Internal Medicine

## 2019-11-29 DIAGNOSIS — E782 Mixed hyperlipidemia: Secondary | ICD-10-CM

## 2019-12-06 ENCOUNTER — Other Ambulatory Visit: Payer: Self-pay | Admitting: Internal Medicine

## 2019-12-23 ENCOUNTER — Other Ambulatory Visit: Payer: Self-pay | Admitting: Internal Medicine

## 2019-12-23 DIAGNOSIS — E782 Mixed hyperlipidemia: Secondary | ICD-10-CM

## 2020-01-22 ENCOUNTER — Other Ambulatory Visit: Payer: Self-pay | Admitting: Internal Medicine

## 2020-01-25 ENCOUNTER — Other Ambulatory Visit: Payer: Self-pay | Admitting: Internal Medicine

## 2020-01-25 DIAGNOSIS — E782 Mixed hyperlipidemia: Secondary | ICD-10-CM

## 2020-02-08 ENCOUNTER — Other Ambulatory Visit: Payer: Self-pay | Admitting: Internal Medicine

## 2020-02-08 DIAGNOSIS — J449 Chronic obstructive pulmonary disease, unspecified: Secondary | ICD-10-CM

## 2020-02-22 ENCOUNTER — Other Ambulatory Visit: Payer: Self-pay | Admitting: Internal Medicine

## 2020-02-22 DIAGNOSIS — E782 Mixed hyperlipidemia: Secondary | ICD-10-CM

## 2020-03-03 ENCOUNTER — Other Ambulatory Visit: Payer: Self-pay | Admitting: Internal Medicine

## 2020-03-03 DIAGNOSIS — J449 Chronic obstructive pulmonary disease, unspecified: Secondary | ICD-10-CM

## 2020-03-07 ENCOUNTER — Other Ambulatory Visit: Payer: Self-pay | Admitting: Internal Medicine

## 2020-03-08 ENCOUNTER — Other Ambulatory Visit: Payer: Self-pay | Admitting: Internal Medicine

## 2020-03-08 DIAGNOSIS — E782 Mixed hyperlipidemia: Secondary | ICD-10-CM

## 2020-04-03 ENCOUNTER — Other Ambulatory Visit: Payer: Self-pay | Admitting: Internal Medicine

## 2020-04-05 ENCOUNTER — Other Ambulatory Visit: Payer: Self-pay | Admitting: Internal Medicine

## 2020-04-05 DIAGNOSIS — J449 Chronic obstructive pulmonary disease, unspecified: Secondary | ICD-10-CM

## 2020-05-02 ENCOUNTER — Other Ambulatory Visit: Payer: Self-pay | Admitting: Internal Medicine

## 2020-05-02 DIAGNOSIS — I10 Essential (primary) hypertension: Secondary | ICD-10-CM

## 2020-05-08 ENCOUNTER — Other Ambulatory Visit: Payer: Self-pay | Admitting: Internal Medicine

## 2020-05-08 DIAGNOSIS — E782 Mixed hyperlipidemia: Secondary | ICD-10-CM

## 2020-05-08 DIAGNOSIS — J449 Chronic obstructive pulmonary disease, unspecified: Secondary | ICD-10-CM

## 2020-05-08 NOTE — Telephone Encounter (Signed)
Looks like he never went to pulmonary.

## 2020-06-02 ENCOUNTER — Other Ambulatory Visit: Payer: Self-pay | Admitting: Internal Medicine

## 2020-06-04 ENCOUNTER — Other Ambulatory Visit: Payer: Self-pay | Admitting: Internal Medicine

## 2020-06-06 ENCOUNTER — Other Ambulatory Visit: Payer: Self-pay | Admitting: Internal Medicine

## 2020-06-06 DIAGNOSIS — J449 Chronic obstructive pulmonary disease, unspecified: Secondary | ICD-10-CM

## 2020-06-06 DIAGNOSIS — E782 Mixed hyperlipidemia: Secondary | ICD-10-CM

## 2020-06-16 ENCOUNTER — Other Ambulatory Visit: Payer: Self-pay | Admitting: Internal Medicine

## 2020-06-16 DIAGNOSIS — I10 Essential (primary) hypertension: Secondary | ICD-10-CM

## 2020-06-22 ENCOUNTER — Other Ambulatory Visit: Payer: Self-pay | Admitting: Internal Medicine

## 2020-06-22 DIAGNOSIS — E782 Mixed hyperlipidemia: Secondary | ICD-10-CM

## 2020-07-03 ENCOUNTER — Other Ambulatory Visit: Payer: Self-pay | Admitting: Internal Medicine

## 2020-07-03 DIAGNOSIS — I10 Essential (primary) hypertension: Secondary | ICD-10-CM

## 2020-07-18 ENCOUNTER — Other Ambulatory Visit: Payer: Self-pay | Admitting: Internal Medicine

## 2020-07-18 DIAGNOSIS — I10 Essential (primary) hypertension: Secondary | ICD-10-CM

## 2020-08-03 ENCOUNTER — Other Ambulatory Visit: Payer: Self-pay | Admitting: Internal Medicine

## 2020-08-03 DIAGNOSIS — I10 Essential (primary) hypertension: Secondary | ICD-10-CM

## 2020-08-06 ENCOUNTER — Other Ambulatory Visit: Payer: Self-pay | Admitting: Internal Medicine

## 2020-08-06 DIAGNOSIS — I10 Essential (primary) hypertension: Secondary | ICD-10-CM

## 2020-08-22 ENCOUNTER — Other Ambulatory Visit: Payer: Self-pay | Admitting: Internal Medicine

## 2020-08-22 DIAGNOSIS — I10 Essential (primary) hypertension: Secondary | ICD-10-CM

## 2021-08-07 ENCOUNTER — Encounter: Payer: Self-pay | Admitting: Family

## 2021-08-07 ENCOUNTER — Ambulatory Visit (INDEPENDENT_AMBULATORY_CARE_PROVIDER_SITE_OTHER): Payer: Self-pay

## 2021-08-07 ENCOUNTER — Other Ambulatory Visit: Payer: Self-pay

## 2021-08-07 ENCOUNTER — Ambulatory Visit: Payer: 59 | Admitting: Family

## 2021-08-07 VITALS — BP 148/76 | HR 86 | Temp 97.7°F | Ht 70.0 in | Wt 251.0 lb

## 2021-08-07 DIAGNOSIS — E782 Mixed hyperlipidemia: Secondary | ICD-10-CM

## 2021-08-07 DIAGNOSIS — E559 Vitamin D deficiency, unspecified: Secondary | ICD-10-CM | POA: Insufficient documentation

## 2021-08-07 DIAGNOSIS — E538 Deficiency of other specified B group vitamins: Secondary | ICD-10-CM

## 2021-08-07 DIAGNOSIS — E039 Hypothyroidism, unspecified: Secondary | ICD-10-CM

## 2021-08-07 DIAGNOSIS — M79671 Pain in right foot: Secondary | ICD-10-CM

## 2021-08-07 DIAGNOSIS — R739 Hyperglycemia, unspecified: Secondary | ICD-10-CM | POA: Insufficient documentation

## 2021-08-07 DIAGNOSIS — J449 Chronic obstructive pulmonary disease, unspecified: Secondary | ICD-10-CM

## 2021-08-07 DIAGNOSIS — F1011 Alcohol abuse, in remission: Secondary | ICD-10-CM

## 2021-08-07 DIAGNOSIS — I10 Essential (primary) hypertension: Secondary | ICD-10-CM

## 2021-08-07 DIAGNOSIS — M1A0710 Idiopathic chronic gout, right ankle and foot, without tophus (tophi): Secondary | ICD-10-CM

## 2021-08-07 DIAGNOSIS — M109 Gout, unspecified: Secondary | ICD-10-CM | POA: Insufficient documentation

## 2021-08-07 LAB — B12 AND FOLATE PANEL
Folate: 5.5 ng/mL — ABNORMAL LOW (ref >5.9)
Vitamin B-12: 268 pg/mL (ref 211–911)

## 2021-08-07 LAB — MICROALBUMIN / CREATININE URINE RATIO
Creatinine,U: 224.9 mg/dL
Microalb Creat Ratio: 10.4 mg/g (ref 0.0–30.0)
Microalb, Ur: 23.3 mg/dL — ABNORMAL HIGH (ref 0.0–1.9)

## 2021-08-07 LAB — CBC WITH DIFFERENTIAL/PLATELET
Basophils Absolute: 0.1 10*3/uL (ref 0.0–0.1)
Basophils Relative: 0.8 % (ref 0.0–3.0)
Eosinophils Absolute: 0.3 10*3/uL (ref 0.0–0.7)
Eosinophils Relative: 2.7 % (ref 0.0–5.0)
HCT: 44.6 % (ref 39.0–52.0)
Hemoglobin: 15.1 g/dL (ref 13.0–17.0)
Lymphocytes Relative: 24.1 % (ref 12.0–46.0)
Lymphs Abs: 2.6 10*3/uL (ref 0.7–4.0)
MCHC: 33.9 g/dL (ref 30.0–36.0)
MCV: 95.7 fl (ref 78.0–100.0)
Monocytes Absolute: 0.6 10*3/uL (ref 0.1–1.0)
Monocytes Relative: 5.3 % (ref 3.0–12.0)
Neutro Abs: 7.2 10*3/uL (ref 1.4–7.7)
Neutrophils Relative %: 67.1 % (ref 43.0–77.0)
Platelets: 188 10*3/uL (ref 150.0–400.0)
RBC: 4.66 Mil/uL (ref 4.22–5.81)
RDW: 13.4 % (ref 11.5–15.5)
WBC: 10.8 10*3/uL — ABNORMAL HIGH (ref 4.0–10.5)

## 2021-08-07 LAB — COMPREHENSIVE METABOLIC PANEL
ALT: 14 U/L (ref 0–53)
AST: 18 U/L (ref 0–37)
Albumin: 4.5 g/dL (ref 3.5–5.2)
Alkaline Phosphatase: 63 U/L (ref 39–117)
BUN: 16 mg/dL (ref 6–23)
CO2: 29 mEq/L (ref 19–32)
Calcium: 9.3 mg/dL (ref 8.4–10.5)
Chloride: 106 mEq/L (ref 96–112)
Creatinine, Ser: 1.23 mg/dL (ref 0.40–1.50)
GFR: 64.96 mL/min (ref >60.00)
Glucose, Bld: 107 mg/dL — ABNORMAL HIGH (ref 70–99)
Potassium: 4.1 mEq/L (ref 3.5–5.1)
Sodium: 141 mEq/L (ref 135–145)
Total Bilirubin: 0.5 mg/dL (ref 0.2–1.2)
Total Protein: 6.7 g/dL (ref 6.0–8.3)

## 2021-08-07 LAB — HEMOGLOBIN A1C: Hgb A1c MFr Bld: 5.4 % (ref 4.6–6.5)

## 2021-08-07 LAB — URIC ACID: Uric Acid, Serum: 7.1 mg/dL (ref 4.0–7.8)

## 2021-08-07 LAB — TSH: TSH: 6.2 u[IU]/mL — ABNORMAL HIGH (ref 0.35–5.50)

## 2021-08-07 LAB — VITAMIN D 25 HYDROXY (VIT D DEFICIENCY, FRACTURES): VITD: 16.2 ng/mL — ABNORMAL LOW (ref 30.00–100.00)

## 2021-08-07 MED ORDER — ATORVASTATIN CALCIUM 10 MG PO TABS: 10.0000 mg | ORAL_TABLET | Freq: Every day | ORAL | 0 refills | Status: DC

## 2021-08-07 MED ORDER — SPIRIVA HANDIHALER 18 MCG IN CAPS: ORAL_CAPSULE | RESPIRATORY_TRACT | 5 refills | Status: DC

## 2021-08-07 MED ORDER — LOSARTAN POTASSIUM 50 MG PO TABS: 50.0000 mg | ORAL_TABLET | Freq: Every day | ORAL | 0 refills | Status: DC

## 2021-08-07 MED ORDER — LEVOTHYROXINE SODIUM 25 MCG PO TABS: ORAL_TABLET | ORAL | 0 refills | Status: DC

## 2021-08-07 MED ORDER — FUROSEMIDE 20 MG PO TABS: 20.0000 mg | ORAL_TABLET | Freq: Every day | ORAL | 0 refills | Status: DC

## 2021-08-07 NOTE — Assessment & Plan Note (Signed)
Pt is still drinking 2-3 beers a night at current, no longer in remission.

## 2021-08-07 NOTE — Assessment & Plan Note (Signed)
tsh ordered today, refill for levothyroxine , pt has not been taking since ran out. Discussed compliance.

## 2021-08-07 NOTE — Assessment & Plan Note (Signed)
b12 ordered pending results 

## 2021-08-07 NOTE — Assessment & Plan Note (Signed)
Refilled spiriva, pt has not been taking since ran out of rx. Discussed compliance. Pt to restart and take as directed. Pt has current unexpired albuterol for prn at home.

## 2021-08-07 NOTE — Assessment & Plan Note (Signed)
Lipid panel ordered, pending results. Refilled atorvastatin, pt has not been taking since ran out. Discussed compliance.

## 2021-08-07 NOTE — Assessment & Plan Note (Signed)
a1c ordered, pending results. Work on diabetic diet exercise as tolerated.

## 2021-08-07 NOTE — Patient Instructions (Signed)
Start monitoring your blood pressure daily, around the same time of day, for the next 2-3 weeks.  Ensure that you have rested for 30 minutes prior to checking your blood pressure. Record your readings and bring them to your next visit.  Stop by the lab prior to leaving today. I will notify you of your results once received.   Complete xray(s) prior to leaving today. I will notify you of your results once received.  I am holding off on treating the foot to rule out what the etiology is.  I am trying to see if this is gout , infection and/or just an injury.  I also want to get the results of your lab work to make sure that you can take medication in regards to your liver or kidney function.  It was a pleasure seeing you today! Please do not hesitate to reach out with any questions and or concerns.  Regards,   Eugenia Pancoast FNP-C

## 2021-08-07 NOTE — Progress Notes (Signed)
Established Patient Office Visit  Subjective:  Patient ID: Ryan Hatfield, male    DOB: [birth year]  Age: 56 y.o. MRN: [id number]  CC:  Chief Complaint  Patient presents with   Transitions Of Care   Foot Pain    Right foot     HPI Ryan Hatfield is here for a transition of care visit.  Prior provider LKG:[id number], FNP Pt is with acute concerns.   Started two weeks ago with right lateral foot pain, that causes intermittent stabbing pain that goes from base of lateral foot up to right lateral anterior top of foot. He does state feels warm to touch. No shooting pain up the leg. He does seem to put all of his weight on his right foot over a year now due to weakness in the left side. Does take Bcs and goodie power, not sure if it gives him much relief.   He states he has low back pain that is chronic, on his left side. One year ago lifted something very heavy at work which caused the injury. He experiences this low back pain with sciatica. Makes the left side weak at times. Was seeing neurosurgeon, and was doing injections which helped a bit but then was released from care because no longer wanted injections.   chronic concerns:  HTN: lisinopril 10 mg once daily as well as lasix which he has ran out of and is not currently taking. He denies CP palp and or sob.   Hypothyroid: was on levothyroxine 25 mcg once daily, ran out of medication. Pt does have increasing fatigue.   HLD: was taking atorvastatin, ran out of medication needs refill. Does drink about 2-3 beers typically daily. Which may also raise his cholesterol. Taking omega 3 fish oils.  COPD: ran out of spiriva, needs to be back on it. A lot of congestion in the chest, which spiriva occasionally helps him but not always.   Past Medical History:  Diagnosis Date   Acute appendicitis    Alcohol abuse, in remission 05/29/2015   Anxiety and depression 05/29/2015   Arthritis, degenerative 02/03/2013   Overview:  Of right  middle finger MCP joint    Cannot sleep 02/03/2013   Chest pain 01/15/2013   Hypertension    Plantar fasciitis of left foot 01/17/2016    Past Surgical History:  Procedure Laterality Date   LAPAROSCOPIC APPENDECTOMY N/A 11/25/2017   Procedure: APPENDECTOMY LAPAROSCOPIC;  Surgeon: Florene Glen, MD;  Location: [facility] ORS;  Service: General;  Laterality: N/A;   SEPTOPLASTY     TONSILLECTOMY      Family History  Problem Relation Age of Onset   Heart disease Mother    Arthritis Mother    Heart disease Father    Diabetes Brother    Stroke Brother    Cancer Maternal Grandmother    Prostate cancer Paternal Grandfather    Kidney cancer Paternal Uncle     Social History   Socioeconomic History   Marital status: Divorced    Spouse name: Not on file   Number of children: Not on file   Years of education: Not on file   Highest education level: Not on file  Occupational History   Occupation: Librarian, academic    Comment: [employer]  Tobacco Use   Smoking status: Every Day    Packs/day: 1.00    Years: 40.00    Pack years: 40.00    Types: Cigarettes   Smokeless tobacco: Never  Substance and Sexual  Activity   Alcohol use: Yes    Alcohol/week: 14.0 standard drinks    Types: 14 Cans of beer per week   Drug use: No   Sexual activity: Yes    Partners: Female  Other Topics Concern   Not on file  Social History Narrative   Not on file   Social Determinants of Health   Financial Resource Strain: Not on file  Food Insecurity: Not on file  Transportation Needs: Not on file  Physical Activity: Not on file  Stress: Not on file  Social Connections: Not on file  Intimate Partner Violence: Not on file    Outpatient Medications Prior to Visit  Medication Sig Dispense Refill   albuterol (VENTOLIN HFA) 108 (90 Base) MCG/ACT inhaler TAKE 2 PUFFS BY MOUTH EVERY 6 HOURS AS NEEDED FOR WHEEZE OR SHORTNESS OF BREATH 8.5 each 1   Multiple Vitamin (MULTIVITAMIN WITH MINERALS) TABS tablet  Take 1 tablet by mouth daily.     Omega-3 Fatty Acids (FISH OIL) 1000 MG CAPS Take 1,000 mg by mouth daily.     Vitamin D, Ergocalciferol, (DRISDOL) 1.25 MG (50000 UNIT) CAPS capsule Take 1 capsule (50,000 Units total) by mouth every 7 (seven) days. 12 capsule 0   cholecalciferol (VITAMIN D3) 25 MCG (1000 UNIT) tablet Take 1,000 Units by mouth daily. (Patient not taking: Reported on 08/07/2021)     atorvastatin (LIPITOR) 10 MG tablet Take 1 tablet (10 mg total) by mouth daily. MUST SCHEDULE PHYSICAL (Patient not taking: Reported on 08/07/2021) 30 tablet 0   furosemide (LASIX) 20 MG tablet TAKE 1 TABLET (20 MG TOTAL) BY MOUTH DAILY. MUST SCHEDULE PHYSICAL (Patient not taking: Reported on 08/07/2021) 30 tablet 0   levothyroxine (SYNTHROID) 25 MCG tablet TAKE 1 TABLET BY MOUTH EVERY DAY BEFORE BREAKFAST (Patient not taking: Reported on 08/07/2021) 90 tablet 0   lisinopril (ZESTRIL) 10 MG tablet TAKE 1 TABLET (10 MG TOTAL) BY MOUTH DAILY. SCHEDULE PHYSICAL EXAM (Patient not taking: Reported on 08/07/2021) 30 tablet 0   SPIRIVA HANDIHALER 18 MCG inhalation capsule INHALE 1 CAPSULE VIA HANDIHALER ONCE DAILY AT THE SAME TIME EVERY DAY (Patient not taking: Reported on 08/07/2021) 30 capsule 0   zinc gluconate 50 MG tablet Take 50 mg by mouth daily. (Patient not taking: Reported on 08/07/2021)     No facility-administered medications prior to visit.    Allergies  Allergen Reactions   Lisinopril Cough   Paroxetine     REACTION: paranoia, confusion   Sertraline Hcl     REACTION: paranoia, confusion    ROS Review of Systems  Constitutional:  Positive for fatigue. Negative for chills, fever and unexpected weight change.  HENT:  Positive for postnasal drip.   Respiratory:  Positive for cough (dry non productive espeically with lisinopril) and shortness of breath (at times ran out of spiriva as well). Negative for wheezing.   Cardiovascular:  Positive for leg swelling (mild, ran out of lasix). Negative for chest pain  and palpitations.  Genitourinary:  Negative for difficulty urinating, frequency and urgency.  Musculoskeletal:  Positive for arthralgias (right foot pain, shooting stabbing pains base of foot) and back pain (left lower back pain intermittently, not at current, with left sided radiculopathy). Negative for neck pain.   Review of Systems  Respiratory:  Negative for shortness of breath.   Cardiovascular:  Negative for chest pain and palpitations.  Gastrointestinal:  Negative for constipation and diarrhea.  Genitourinary:  Negative for dysuria, frequency and urgency.  Musculoskeletal:  Negative for  myalgias.  Psychiatric/Behavioral:  Negative for depression and suicidal ideas.   All other systems reviewed and are negative.    Objective:    Physical Exam Constitutional:      General: He is not in acute distress.    Appearance: Normal appearance. He is obese. He is not ill-appearing, toxic-appearing or diaphoretic.  HENT:     Head: Normocephalic.     Nose: Nose normal.     Mouth/Throat:     Mouth: Mucous membranes are moist.  Cardiovascular:     Rate and Rhythm: Normal rate and regular rhythm.     Heart sounds: Normal heart sounds. No murmur heard. Pulmonary:     Effort: Pulmonary effort is normal.     Breath sounds: Normal breath sounds.  Abdominal:     General: Abdomen is flat.  Musculoskeletal:     Right lower leg: 1+ Edema present.     Left lower leg: 1+ Edema present.     Right foot: Normal range of motion. Tenderness (right mid lateral tenderness palpated with no warmth. tender to touch. nontender rom) present.     Comments: Redness to bil lower extremities at toes however not warm to touch.   Neurological:     General: No focal deficit present.     Mental Status: He is alert and oriented to person, place, and time.  Psychiatric:        Mood and Affect: Mood normal.        Behavior: Behavior normal.        Thought Content: Thought content normal.        Judgment: Judgment  normal.    Gen: NAD, resting comfortably CV: RRR with no murmurs appreciated Pulm: NWOB, CTAB with no crackles, wheezes, or rhonchi Skin: warm, dry Psych: Normal affect and thought content  BP (!) 148/76    Pulse 86    Temp 97.7 F (36.5 C) (Temporal)    Ht 5\' 10"  (1.778 m)    Wt 251 lb (113.9 kg)    SpO2 96%    BMI 36.01 kg/m  Wt Readings from Last 3 Encounters:  08/07/21 251 lb (113.9 kg)  10/17/19 278 lb (126.1 kg)  09/13/19 281 lb (127.5 kg)     Health Maintenance Due  Topic Date Due   COVID-19 Vaccine (1) Never done   COLON CANCER SCREENING ANNUAL FOBT  Never done   COLONOSCOPY (Pts 45-3yrs Insurance coverage will need to be confirmed)  Never done   TETANUS/TDAP  07/08/2009   Zoster Vaccines- Shingrix (1 of 2) Never done   INFLUENZA VACCINE  02/04/2021    There are no preventive care reminders to display for this patient.  Lab Results  Component Value Date   TSH 4.01 10/17/2019   Lab Results  Component Value Date   WBC 13.1 (H) 08/30/2019   HGB 15.0 08/30/2019   HCT 45.6 08/30/2019   MCV 92.7 08/30/2019   PLT 224.0 08/30/2019   Lab Results  Component Value Date   NA 142 10/17/2019   K 4.1 10/17/2019   CO2 30 10/17/2019   GLUCOSE 150 (H) 10/17/2019   BUN 14 10/17/2019   CREATININE 1.10 10/17/2019   BILITOT 0.6 10/17/2019   ALKPHOS 74 10/17/2019   AST 16 10/17/2019   ALT 13 10/17/2019   PROT 6.7 10/17/2019   ALBUMIN 4.3 10/17/2019   CALCIUM 9.2 10/17/2019   ANIONGAP 10 08/15/2019   GFR 69.20 10/17/2019   Lab Results  Component Value Date   CHOL  204 (H) 08/30/2019   Lab Results  Component Value Date   HDL 30.20 (L) 08/30/2019   Lab Results  Component Value Date   LDLCALC 134 (H) 08/30/2019   Lab Results  Component Value Date   TRIG 200.0 (H) 08/30/2019   Lab Results  Component Value Date   CHOLHDL 7 08/30/2019   Lab Results  Component Value Date   HGBA1C 5.9 08/30/2019      Assessment & Plan:   Problem List Items Addressed  This Visit       Cardiovascular and Mediastinum   Essential hypertension    Pt advised of the following:  Pt ran out of lisinopril however didn't like bc it was causing him to cough a lot more frequently. Change to losartan 50 mg once daily.. Monitor blood pressure periodically. Goal is <130/90 on average. Ensure that you have rested for 30 minutes prior to checking your blood pressure. Record your readings and bring them to your next visit if necessary.work on a low sodium diet.       Relevant Medications   atorvastatin (LIPITOR) 10 MG tablet   furosemide (LASIX) 20 MG tablet   losartan (COZAAR) 50 MG tablet   Other Relevant Orders   Microalbumin / creatinine urine ratio     Respiratory   COPD (chronic obstructive pulmonary disease) (HCC)    Refilled spiriva, pt has not been taking since ran out of rx. Discussed compliance. Pt to restart and take as directed. Pt has current unexpired albuterol for prn at home.      Relevant Medications   tiotropium (SPIRIVA HANDIHALER) 18 MCG inhalation capsule     Endocrine   Acquired hypothyroidism    tsh ordered today, refill for levothyroxine , pt has not been taking since ran out. Discussed compliance.      Relevant Medications   levothyroxine (SYNTHROID) 25 MCG tablet   Other Relevant Orders   TSH     Musculoskeletal and Integument   Chronic gout of right foot    Uric acid ordered today, pending result to r/o gout however not warm to touch on assessment of right lateral foot, pending xray as well.       Relevant Orders   Uric acid     Other   Alcohol abuse, in remission    Pt is still drinking 2-3 beers a night at current, no longer in remission.        Mixed hyperlipidemia    Lipid panel ordered, pending results. Refilled atorvastatin, pt has not been taking since ran out. Discussed compliance.      Relevant Medications   atorvastatin (LIPITOR) 10 MG tablet   furosemide (LASIX) 20 MG tablet   losartan (COZAAR) 50 MG  tablet   Vitamin B12 deficiency    b12 ordered pending results.      Relevant Orders   B12 and Folate Panel   Vitamin D deficiency    Vitamin d ordered pending results.       Relevant Orders   VITAMIN D 25 Hydroxy (Vit-D Deficiency, Fractures)   Hyperglycemia    a1c ordered, pending results. Work on diabetic diet exercise as tolerated.       Relevant Orders   Comprehensive metabolic panel   Hemoglobin A1c   Right foot pain - Primary    Xray right foot today, ordered cbc and uric acid to rule out infection vs gout. Xray to r/o fracture/calcification/spurring. Will refer accordingly pending results.       Relevant Orders  DG Foot Complete Right   Uric acid   CBC w/Diff    Meds ordered this encounter  Medications   DISCONTD: losartan (COZAAR) 50 MG tablet    Sig: Take 1 tablet (50 mg total) by mouth daily.    Dispense:  90 tablet    Refill:  0    Order Specific Question:   Supervising Provider    Answer:   BEDSOLE, AMY E [[id number]]   atorvastatin (LIPITOR) 10 MG tablet    Sig: Take 1 tablet (10 mg total) by mouth daily. MUST SCHEDULE PHYSICAL    Dispense:  30 tablet    Refill:  0    PATIENT MUST SCHEDULE PHYSICAL EXAM    Order Specific Question:   Supervising Provider    Answer:   BEDSOLE, AMY E [[id number]]   furosemide (LASIX) 20 MG tablet    Sig: Take 1 tablet (20 mg total) by mouth daily. MUST SCHEDULE PHYSICAL    Dispense:  30 tablet    Refill:  0    NO MORE REFILLS WITHOUT PHYSICAL EXAM    Order Specific Question:   Supervising Provider    Answer:   BEDSOLE, AMY E [[id number]]   levothyroxine (SYNTHROID) 25 MCG tablet    Sig: TAKE 1 TABLET BY MOUTH EVERY DAY BEFORE BREAKFAST    Dispense:  90 tablet    Refill:  0    Order Specific Question:   Supervising Provider    Answer:   BEDSOLE, AMY E [[id number]]   losartan (COZAAR) 50 MG tablet    Sig: Take 1 tablet (50 mg total) by mouth daily.    Dispense:  90 tablet    Refill:  0    Order Specific Question:   Supervising Provider     Answer:   BEDSOLE, AMY E [[id number]]   tiotropium (SPIRIVA HANDIHALER) 18 MCG inhalation capsule    Sig: INHALE 1 CAPSULE VIA HANDIHALER ONCE DAILY AT THE SAME TIME EVERY DAY    Dispense:  30 capsule    Refill:  5    PATIENT MUST SCHEDULE PHYSICAL    Order Specific Question:   Supervising Provider    Answer:   Diona Browner, AMY E [[id number]]    Follow-up: Return in about 1 month (around 09/04/2021) for with blood pressure log for follow up .    Eugenia Pancoast, FNP

## 2021-08-07 NOTE — Assessment & Plan Note (Signed)
Xray right foot today, ordered cbc and uric acid to rule out infection vs gout. Xray to r/o fracture/calcification/spurring. Will refer accordingly pending results.

## 2021-08-07 NOTE — Assessment & Plan Note (Signed)
Uric acid ordered today, pending result to r/o gout however not warm to touch on assessment of right lateral foot, pending xray as well.

## 2021-08-07 NOTE — Assessment & Plan Note (Signed)
Pt advised of the following:  Pt ran out of lisinopril however didn't like bc it was causing him to cough a lot more frequently. Change to losartan 50 mg once daily.. Monitor blood pressure periodically. Goal is <130/90 on average. Ensure that you have rested for 30 minutes prior to checking your blood pressure. Record your readings and bring them to your next visit if necessary.work on a low sodium diet.

## 2021-08-07 NOTE — Assessment & Plan Note (Signed)
Vitamin d ordered pending results ? ?

## 2021-08-08 ENCOUNTER — Other Ambulatory Visit: Payer: Self-pay | Admitting: Family

## 2021-08-08 DIAGNOSIS — M79671 Pain in right foot: Secondary | ICD-10-CM

## 2021-08-08 DIAGNOSIS — M19171 Post-traumatic osteoarthritis, right ankle and foot: Secondary | ICD-10-CM

## 2021-08-08 NOTE — Progress Notes (Signed)
Urine microalbumin positive, pt is starting on losartan so that will help protect his kidneys from further/future damage.   B12 and folate on the lower end of normal, advise pt start otc b12 1000 mcg once dialy and I will send RX for folate 1 mg to take once daily as well. We will repeat this in three months.   Vitamin D also VERY low. I will send RX strength dose, then once complete advise pt to continue with daily maintenance Vitamin D3 otc 2000 IU once daily.   Pt is not diabetic, HGA1C is great.   Thyroid is up, but pt was not taking his medication. Tell pt to start medication again and we will repeat thyroid levels at one month follow up appt.   White blood cell mildly elevated, this can be because of pt being a smoker wouldn't be abnormal/ ask has he even seen hematologist for workup of elevated WBC?   Uric acid was not elevated so not likely gout.   Lastly, foot xray did show osteoarthritis, but also stated some spurring that can contribute to pain as well as a chronic linear lucency which may be because of a fracture a long time ago. Let pt know I am referring him to podiatry (foot specialist) especially since he is still in pain. Tylenol as needed/ and supportive shoes in the meantime.

## 2021-08-09 ENCOUNTER — Telehealth: Payer: Self-pay | Admitting: Family

## 2021-08-09 NOTE — Telephone Encounter (Signed)
Pt is returning a call about lab results

## 2021-08-12 ENCOUNTER — Other Ambulatory Visit: Payer: Self-pay | Admitting: Family

## 2021-08-12 DIAGNOSIS — E559 Vitamin D deficiency, unspecified: Secondary | ICD-10-CM

## 2021-08-12 DIAGNOSIS — E538 Deficiency of other specified B group vitamins: Secondary | ICD-10-CM

## 2021-08-12 MED ORDER — VITAMIN D (ERGOCALCIFEROL) 1.25 MG (50000 UNIT) PO CAPS: 50000.0000 [IU] | ORAL_CAPSULE | ORAL | 0 refills | Status: AC

## 2021-08-12 MED ORDER — FOLIC ACID 1 MG PO TABS: 1.0000 mg | ORAL_TABLET | Freq: Every day | ORAL | 2 refills | Status: DC

## 2021-08-12 NOTE — Progress Notes (Signed)
Sent in vitamin D and folic acid RX.  Will d/w pt leukocytosis at f/u appt.

## 2021-08-12 NOTE — Telephone Encounter (Signed)
See result notes, spoke with patient

## 2021-08-21 ENCOUNTER — Ambulatory Visit (INDEPENDENT_AMBULATORY_CARE_PROVIDER_SITE_OTHER): Payer: BC Managed Care – PPO | Admitting: Podiatry

## 2021-08-21 ENCOUNTER — Ambulatory Visit (INDEPENDENT_AMBULATORY_CARE_PROVIDER_SITE_OTHER): Payer: BC Managed Care – PPO

## 2021-08-21 ENCOUNTER — Other Ambulatory Visit: Payer: Self-pay | Admitting: Podiatry

## 2021-08-21 ENCOUNTER — Encounter: Payer: Self-pay | Admitting: Podiatry

## 2021-08-21 ENCOUNTER — Other Ambulatory Visit: Payer: Self-pay

## 2021-08-21 DIAGNOSIS — M7751 Other enthesopathy of right foot: Secondary | ICD-10-CM

## 2021-08-21 DIAGNOSIS — M7671 Peroneal tendinitis, right leg: Secondary | ICD-10-CM

## 2021-08-21 MED ORDER — MELOXICAM 15 MG PO TABS: 15.0000 mg | ORAL_TABLET | Freq: Every day | ORAL | 3 refills | Status: DC

## 2021-08-21 MED ORDER — METHYLPREDNISOLONE 4 MG PO TBPK: ORAL_TABLET | ORAL | 0 refills | Status: DC

## 2021-08-21 NOTE — Progress Notes (Signed)
°  Subjective:  Patient ID: Ryan Hatfield, male    DOB: [birth year],  MRN: [id number]  Chief Complaint  Patient presents with   Foot Pain    Patient presents today for right base of 5th foot pain x 2-3 months    56 y.o. male presents with the above complaint. History confirmed with patient. No known traumatic event.'s been going on about 2 or 3 months slowly worsening.  He wears composite shoes for work, sometimes walks over 10 miles at work  Objective:  Physical Exam: warm, good capillary refill, no trophic changes or ulcerative lesions, normal DP and PT pulses, and normal sensory exam. Left Foot: normal exam, no swelling, tenderness, instability; ligaments intact, full range of motion of all ankle/foot joints Right Foot:  Pain at the insertion of the peroneus brevis in the fifth metatarsal base  No images are attached to the encounter.  Radiographs: Multiple views x-ray of the right foot: no fracture, dislocation, swelling or degenerative changes noted Assessment:   1. Peroneal tendinitis of right lower extremity      Plan:  Patient was evaluated and treated and all questions answered.  Discussed the etiology and treatment options for peroneal tendinitis including stretching, formal physical therapy , supportive shoegears such as a running shoe or sneaker, bracing, topical and oral medications.  We also discussed that I do not routinely perform injections in this area because of the risk of an increased damage or rupture of the tendon.  We also discussed the role of surgical treatment of this for patients who do not improve after exhausting non-surgical treatment options.  -XR reviewed with patient -Educated on stretching and icing of the affected limb. -Home PT plan dispensed -Rx for Medrol and Meloxicam. Advised to start Meloxicam the day after completion of oral steroid taper. -If not improving next visit would recommend formal physical therapy, CAM boot and out of  work  Return in about 8 weeks (around 10/16/2021) for re-check peroneal tendinitis.

## 2021-08-21 NOTE — Patient Instructions (Signed)

## 2021-09-03 ENCOUNTER — Other Ambulatory Visit: Payer: Self-pay | Admitting: Family

## 2021-09-03 DIAGNOSIS — I10 Essential (primary) hypertension: Secondary | ICD-10-CM

## 2021-09-03 DIAGNOSIS — E782 Mixed hyperlipidemia: Secondary | ICD-10-CM

## 2021-09-04 ENCOUNTER — Ambulatory Visit: Payer: Self-pay | Admitting: Family

## 2021-09-05 ENCOUNTER — Other Ambulatory Visit: Payer: Self-pay

## 2021-09-05 ENCOUNTER — Other Ambulatory Visit: Payer: Self-pay | Admitting: Family

## 2021-09-05 ENCOUNTER — Ambulatory Visit: Payer: BC Managed Care – PPO | Admitting: Family

## 2021-09-05 ENCOUNTER — Encounter: Payer: Self-pay | Admitting: Family

## 2021-09-05 VITALS — BP 140/62 | HR 84 | Temp 97.9°F | Ht 70.0 in | Wt 258.5 lb

## 2021-09-05 DIAGNOSIS — D72823 Leukemoid reaction: Secondary | ICD-10-CM | POA: Insufficient documentation

## 2021-09-05 DIAGNOSIS — D72829 Elevated white blood cell count, unspecified: Secondary | ICD-10-CM | POA: Insufficient documentation

## 2021-09-05 DIAGNOSIS — E039 Hypothyroidism, unspecified: Secondary | ICD-10-CM

## 2021-09-05 DIAGNOSIS — I10 Essential (primary) hypertension: Secondary | ICD-10-CM | POA: Diagnosis not present

## 2021-09-05 LAB — CBC WITH DIFFERENTIAL/PLATELET
Basophils Absolute: 0.1 10*3/uL (ref 0.0–0.1)
Basophils Relative: 0.8 % (ref 0.0–3.0)
Eosinophils Absolute: 0.3 10*3/uL (ref 0.0–0.7)
Eosinophils Relative: 2.7 % (ref 0.0–5.0)
HCT: 45.5 % (ref 39.0–52.0)
Hemoglobin: 15.1 g/dL (ref 13.0–17.0)
Lymphocytes Relative: 22.9 % (ref 12.0–46.0)
Lymphs Abs: 2.7 10*3/uL (ref 0.7–4.0)
MCHC: 33.1 g/dL (ref 30.0–36.0)
MCV: 97.2 fl (ref 78.0–100.0)
Monocytes Absolute: 0.8 10*3/uL (ref 0.1–1.0)
Monocytes Relative: 6.5 % (ref 3.0–12.0)
Neutro Abs: 7.8 10*3/uL — ABNORMAL HIGH (ref 1.4–7.7)
Neutrophils Relative %: 67.1 % (ref 43.0–77.0)
Platelets: 202 10*3/uL (ref 150.0–400.0)
RBC: 4.69 Mil/uL (ref 4.22–5.81)
RDW: 13.3 % (ref 11.5–15.5)
WBC: 11.7 10*3/uL — ABNORMAL HIGH (ref 4.0–10.5)

## 2021-09-05 LAB — TSH: TSH: 3.2 u[IU]/mL (ref 0.35–5.50)

## 2021-09-05 MED ORDER — LEVOTHYROXINE SODIUM 50 MCG PO TABS: 50.0000 ug | ORAL_TABLET | Freq: Every day | ORAL | 0 refills | Status: DC

## 2021-09-05 NOTE — Progress Notes (Signed)
Established Patient Office Visit  Subjective:  Patient ID: BRENDT DIBLE, male    DOB: [birth year]  Age: 56 y.o. MRN: [id number]  CC:  Chief Complaint  Patient presents with   Hypertension    Here for 1 mo f/u.    HPI Imaad E Ricklefs is here today for follow up.  Pt is without acute concerns.  B12 def/folate def: started b12 otc as well as folate acid.   Right lower ext tendinitis: saw podiatry was started on steroid, completed and now taking meloxicam 15 mg once daily. Also using better shoes and inserts which has helped him a bit.   B12 and folate on the lower end of normal, advise pt start otc b12 1000 mcg once dialy and I will send RX for folate 1 mg to take once daily as well. We will repeat this in three months.    Vitamin D def: still on RX dose once weekly.    Hypothyroid: taking 25 mcg levothyroxine. Still with fatigue, joint pains, no constipation. Hair is thinning.   White blood cell mildly elevated, this can be because of pt being a smoker wouldn't be abnormal/ has never had hematology workup.   HTN: recently changed to losartan 50 mg not checking blood pressure at home. Cough has greatly improved without lisinopril however. No headaches, no blurry vision, no chest pain no sob.   Past Medical History:  Diagnosis Date   Acute appendicitis    Alcohol abuse, in remission 05/29/2015   Anxiety and depression 05/29/2015   Arthritis, degenerative 02/03/2013   Overview:  Of right middle finger MCP joint    Cannot sleep 02/03/2013   Chest pain 01/15/2013   Hypertension    Plantar fasciitis of left foot 01/17/2016    Past Surgical History:  Procedure Laterality Date   LAPAROSCOPIC APPENDECTOMY N/A 11/25/2017   Procedure: APPENDECTOMY LAPAROSCOPIC;  Surgeon: Florene Glen, MD;  Location: [facility] ORS;  Service: General;  Laterality: N/A;   SEPTOPLASTY     TONSILLECTOMY      Family History  Problem Relation Age of Onset   Heart disease Mother    Arthritis  Mother    Heart disease Father    Diabetes Brother    Stroke Brother    Cancer Maternal Grandmother    Prostate cancer Paternal Grandfather    Kidney cancer Paternal Uncle     Social History   Socioeconomic History   Marital status: Divorced    Spouse name: Not on file   Number of children: Not on file   Years of education: Not on file   Highest education level: Not on file  Occupational History   Occupation: Librarian, academic    Comment: [employer]  Tobacco Use   Smoking status: Every Day    Packs/day: 1.00    Years: 40.00    Pack years: 40.00    Types: Cigarettes   Smokeless tobacco: Never  Substance and Sexual Activity   Alcohol use: Yes    Alcohol/week: 14.0 standard drinks    Types: 14 Cans of beer per week   Drug use: No   Sexual activity: Yes    Partners: Female  Other Topics Concern   Not on file  Social History Narrative   Not on file   Social Determinants of Health   Financial Resource Strain: Not on file  Food Insecurity: Not on file  Transportation Needs: Not on file  Physical Activity: Not on file  Stress: Not on file  Social  Connections: Not on file  Intimate Partner Violence: Not on file    Outpatient Medications Prior to Visit  Medication Sig Dispense Refill   albuterol (VENTOLIN HFA) 108 (90 Base) MCG/ACT inhaler TAKE 2 PUFFS BY MOUTH EVERY 6 HOURS AS NEEDED FOR WHEEZE OR SHORTNESS OF BREATH 8.5 each 1   atorvastatin (LIPITOR) 10 MG tablet Take 1 tablet (10 mg total) by mouth daily. MUST SCHEDULE PHYSICAL 30 tablet 0   cholecalciferol (VITAMIN D3) 25 MCG (1000 UNIT) tablet Take 1,000 Units by mouth daily.     folic acid (FOLVITE) 1 MG tablet Take 1 tablet (1 mg total) by mouth daily. 30 tablet 2   furosemide (LASIX) 20 MG tablet Take 1 tablet (20 mg total) by mouth daily. MUST SCHEDULE PHYSICAL 30 tablet 0   levothyroxine (SYNTHROID) 25 MCG tablet TAKE 1 TABLET BY MOUTH EVERY DAY BEFORE BREAKFAST 90 tablet 0   losartan (COZAAR) 50 MG tablet  Take 1 tablet (50 mg total) by mouth daily. 90 tablet 0   meloxicam (MOBIC) 15 MG tablet Take 1 tablet (15 mg total) by mouth daily. 30 tablet 3   Multiple Vitamin (MULTIVITAMIN WITH MINERALS) TABS tablet Take 1 tablet by mouth daily.     Omega-3 Fatty Acids (FISH OIL) 1000 MG CAPS Take 1,000 mg by mouth daily.     tiotropium (SPIRIVA HANDIHALER) 18 MCG inhalation capsule INHALE 1 CAPSULE VIA HANDIHALER ONCE DAILY AT THE SAME TIME EVERY DAY 30 capsule 5   Vitamin D, Ergocalciferol, (DRISDOL) 1.25 MG (50000 UNIT) CAPS capsule Take 1 capsule (50,000 Units total) by mouth every 7 (seven) days for 8 doses. 8 capsule 0   methylPREDNISolone (MEDROL DOSEPAK) 4 MG TBPK tablet 6 day dose pack - take as directed 21 tablet 0   No facility-administered medications prior to visit.    Allergies  Allergen Reactions   Lisinopril Cough   Paroxetine     REACTION: paranoia, confusion   Sertraline Hcl     REACTION: paranoia, confusion    ROS Review of Systems  Review of Systems  With fatigue  Respiratory:  Negative for shortness of breath.   Cardiovascular:  Negative for chest pain and palpitations.  Gastrointestinal:  Negative for constipation and diarrhea.  Genitourinary:  Negative for dysuria, frequency and urgency.  Musculoskeletal:  Negative for myalgias.  Psychiatric/Behavioral:  Negative for depression and suicidal ideas.   All other systems reviewed and are negative.    Objective:    Physical Exam  Gen: NAD, resting comfortably CV: RRR with no murmurs appreciated MSK: no edema, cyanosis, or clubbing noted Skin: warm, dry Psych: Normal affect and thought content  BP 140/62    Pulse 84    Temp 97.9 F (36.6 C) (Temporal)    Ht 5\' 10"  (1.778 m)    Wt 258 lb 8 oz (117.3 kg)    SpO2 96%    BMI 37.09 kg/m  Wt Readings from Last 3 Encounters:  09/05/21 258 lb 8 oz (117.3 kg)  08/07/21 251 lb (113.9 kg)  10/17/19 278 lb (126.1 kg)     Health Maintenance Due  Topic Date Due    COVID-19 Vaccine (1) Never done   COLON CANCER SCREENING ANNUAL FOBT  Never done   COLONOSCOPY (Pts 45-6yrs Insurance coverage will need to be confirmed)  Never done   TETANUS/TDAP  07/08/2009   Zoster Vaccines- Shingrix (1 of 2) Never done    There are no preventive care reminders to display for this patient.  Lab  Results  Component Value Date   TSH 6.20 (H) 08/07/2021   Lab Results  Component Value Date   WBC 10.8 (H) 08/07/2021   HGB 15.1 08/07/2021   HCT 44.6 08/07/2021   MCV 95.7 08/07/2021   PLT 188.0 08/07/2021   Lab Results  Component Value Date   NA 141 08/07/2021   K 4.1 08/07/2021   CO2 29 08/07/2021   GLUCOSE 107 (H) 08/07/2021   BUN 16 08/07/2021   CREATININE 1.23 08/07/2021   BILITOT 0.5 08/07/2021   ALKPHOS 63 08/07/2021   AST 18 08/07/2021   ALT 14 08/07/2021   PROT 6.7 08/07/2021   ALBUMIN 4.5 08/07/2021   CALCIUM 9.3 08/07/2021   ANIONGAP 10 08/15/2019   GFR 64.96 08/07/2021   Lab Results  Component Value Date   CHOL 204 (H) 08/30/2019   Lab Results  Component Value Date   HDL 30.20 (L) 08/30/2019   Lab Results  Component Value Date   LDLCALC 134 (H) 08/30/2019   Lab Results  Component Value Date   TRIG 200.0 (H) 08/30/2019   Lab Results  Component Value Date   CHOLHDL 7 08/30/2019   Lab Results  Component Value Date   HGBA1C 5.4 08/07/2021      Assessment & Plan:   Problem List Items Addressed This Visit       Cardiovascular and Mediastinum   Essential hypertension    Patient advised to please start checking his blood pressure daily so that we can have her record from home.  Continue losartan 50 mg from 80 daily to 100 mg patient to update me in the next few weeks with his blood pressure log readings.        Endocrine   Acquired hypothyroidism - Primary    TSH ordered today pending this we will determine if we need to increase levothyroxine dosage      Relevant Orders   TSH     Other   Leukocytosis    Repeat CBC  today if still elevated as leukocytosis has been chronic will refer to hematology.  May also likely be due to chronic smoking and/or obesity.      Relevant Orders   CBC with Differential    No orders of the defined types were placed in this encounter.   Follow-up: Return in about 3 months (around 12/06/2021).    Eugenia Pancoast, FNP

## 2021-09-05 NOTE — Assessment & Plan Note (Signed)
TSH ordered today pending this we will determine if we need to increase levothyroxine dosage ?

## 2021-09-05 NOTE — Assessment & Plan Note (Addendum)
Patient advised to please start checking his blood pressure daily so that we can have him record from home.  Continue losartan 50 mg, patient to update me in the next few weeks with his blood pressure log readings.

## 2021-09-05 NOTE — Assessment & Plan Note (Signed)
Repeat CBC today if still elevated as leukocytosis has been chronic will refer to hematology.  May also likely be due to chronic smoking and/or obesity. ?

## 2021-09-05 NOTE — Patient Instructions (Signed)
Stop by the lab prior to leaving today. I will notify you of your results once received. ? ?Start monitoring your blood pressure daily, around the same time of day, for the next 2-3 weeks.  Ensure that you have rested for 30 minutes prior to checking your blood pressure. Record your readings and bring them to your next visit. ?Please come in with log next visit.   ? ?It was a pleasure seeing you today! Please do not hesitate to reach out with any questions and or concerns. ? ?Regards,  ? ?Zebulen Simonis ?FNP-C ? ?

## 2021-10-01 ENCOUNTER — Other Ambulatory Visit: Payer: Self-pay | Admitting: Family

## 2021-10-01 DIAGNOSIS — E782 Mixed hyperlipidemia: Secondary | ICD-10-CM

## 2021-10-01 DIAGNOSIS — I10 Essential (primary) hypertension: Secondary | ICD-10-CM

## 2021-10-16 ENCOUNTER — Ambulatory Visit (INDEPENDENT_AMBULATORY_CARE_PROVIDER_SITE_OTHER): Payer: BC Managed Care – PPO | Admitting: Podiatry

## 2021-10-16 ENCOUNTER — Encounter: Payer: Self-pay | Admitting: Podiatry

## 2021-10-16 DIAGNOSIS — M7671 Peroneal tendinitis, right leg: Secondary | ICD-10-CM | POA: Diagnosis not present

## 2021-10-16 NOTE — Progress Notes (Signed)
?  Subjective:  ?Patient ID: Ryan Hatfield, male    DOB: 09-02-1965,  MRN: [id number] ? ?Chief Complaint  ?Patient presents with  ? Foot Pain  ?  "Its doing much better"  ? ? ?56 y.o. male presents with the above complaint. History confirmed with patient.  Doing well is improved quite a bit says is about 75% better than it used to be ? ?Objective:  ?Physical Exam: ?warm, good capillary refill, no trophic changes or ulcerative lesions, normal DP and PT pulses, and normal sensory exam. ?Left Foot: normal exam, no swelling, tenderness, instability; ligaments intact, full range of motion of all ankle/foot joints ?Right Foot:  Very mild pain at the insertion of the peroneus brevis in the fifth metatarsal base ? ?No images are attached to the encounter. ? ?Radiographs: ?Multiple views x-ray of the right foot: no fracture, dislocation, swelling or degenerative changes noted ?Assessment:  ? ?1. Peroneal tendinitis of right lower extremity   ? ? ? ?Plan:  ?Patient was evaluated and treated and all questions answered. ? ?Doing much better.  I recommend he continue his home therapy exercises.  He will continue taking meloxicam as needed.,  Can see me as needed if it returns or worsens.  If it does can consider physical therapy CAM boot and some time out of work for injection with this. ? ?Return if symptoms worsen or fail to improve.  ? ?

## 2021-10-17 ENCOUNTER — Other Ambulatory Visit: Payer: Self-pay | Admitting: Family

## 2021-10-17 DIAGNOSIS — E559 Vitamin D deficiency, unspecified: Secondary | ICD-10-CM

## 2021-11-07 ENCOUNTER — Other Ambulatory Visit: Payer: Self-pay | Admitting: Family

## 2021-11-07 DIAGNOSIS — I10 Essential (primary) hypertension: Secondary | ICD-10-CM

## 2021-11-07 DIAGNOSIS — E039 Hypothyroidism, unspecified: Secondary | ICD-10-CM

## 2021-11-15 ENCOUNTER — Other Ambulatory Visit: Payer: Self-pay | Admitting: Family

## 2021-11-15 DIAGNOSIS — E538 Deficiency of other specified B group vitamins: Secondary | ICD-10-CM

## 2021-12-05 ENCOUNTER — Other Ambulatory Visit: Payer: Self-pay | Admitting: Family

## 2021-12-06 ENCOUNTER — Ambulatory Visit: Payer: BC Managed Care – PPO | Admitting: Family

## 2022-01-05 ENCOUNTER — Other Ambulatory Visit: Payer: Self-pay | Admitting: Family

## 2022-01-05 DIAGNOSIS — I10 Essential (primary) hypertension: Secondary | ICD-10-CM

## 2022-01-14 ENCOUNTER — Emergency Department
Admission: EM | Admit: 2022-01-14 | Discharge: 2022-01-14 | Disposition: A | Discharge status: Home / Self Care | Payer: BC Managed Care – PPO | Attending: Emergency Medicine | Admitting: Emergency Medicine

## 2022-01-14 ENCOUNTER — Emergency Department: Payer: BC Managed Care – PPO

## 2022-01-14 ENCOUNTER — Other Ambulatory Visit: Payer: Self-pay

## 2022-01-14 DIAGNOSIS — Z87891 Personal history of nicotine dependence: Secondary | ICD-10-CM | POA: Diagnosis not present

## 2022-01-14 DIAGNOSIS — D72829 Elevated white blood cell count, unspecified: Secondary | ICD-10-CM | POA: Insufficient documentation

## 2022-01-14 DIAGNOSIS — E039 Hypothyroidism, unspecified: Secondary | ICD-10-CM | POA: Insufficient documentation

## 2022-01-14 DIAGNOSIS — R0789 Other chest pain: Secondary | ICD-10-CM | POA: Diagnosis not present

## 2022-01-14 DIAGNOSIS — I10 Essential (primary) hypertension: Secondary | ICD-10-CM | POA: Insufficient documentation

## 2022-01-14 DIAGNOSIS — J441 Chronic obstructive pulmonary disease with (acute) exacerbation: Secondary | ICD-10-CM | POA: Insufficient documentation

## 2022-01-14 DIAGNOSIS — R079 Chest pain, unspecified: Secondary | ICD-10-CM | POA: Diagnosis not present

## 2022-01-14 LAB — BASIC METABOLIC PANEL
Anion gap: 10 (ref 5–15)
BUN: 15 mg/dL (ref 6–20)
CO2: 28 mmol/L (ref 22–32)
Calcium: 8.9 mg/dL (ref 8.9–10.3)
Chloride: 99 mmol/L (ref 98–111)
Creatinine, Ser: 1.04 mg/dL (ref 0.61–1.24)
GFR, Estimated: >60 mL/min (ref >60)
Glucose, Bld: 114 mg/dL — ABNORMAL HIGH (ref 70–99)
Potassium: 3.6 mmol/L (ref 3.5–5.1)
Sodium: 137 mmol/L (ref 135–145)

## 2022-01-14 LAB — CBC
HCT: 43.9 % (ref 39.0–52.0)
Hemoglobin: 14.6 g/dL (ref 13.0–17.0)
MCH: 30.8 pg (ref 26.0–34.0)
MCHC: 33.3 g/dL (ref 30.0–36.0)
MCV: 92.6 fL (ref 80.0–100.0)
Platelets: 242 10*3/uL (ref 150–400)
RBC: 4.74 MIL/uL (ref 4.22–5.81)
RDW: 12.3 % (ref 11.5–15.5)
WBC: 11 10*3/uL — ABNORMAL HIGH (ref 4.0–10.5)
nRBC: 0 % (ref 0.0–0.2)

## 2022-01-14 LAB — TROPONIN I (HIGH SENSITIVITY)
Troponin I (High Sensitivity): 7 ng/L (ref <18)
Troponin I (High Sensitivity): 5 ng/L (ref <18)

## 2022-01-14 LAB — BRAIN NATRIURETIC PEPTIDE: B Natriuretic Peptide: 90.4 pg/mL (ref 0.0–100.0)

## 2022-01-14 MED ORDER — PREDNISONE 20 MG PO TABS
60.0000 mg | ORAL_TABLET | Freq: Once | ORAL | Status: AC
  Administered 2022-01-14: 60 mg via ORAL
  Filled 2022-01-14: qty 3

## 2022-01-14 MED ORDER — IPRATROPIUM-ALBUTEROL 0.5-2.5 (3) MG/3ML IN SOLN
6.0000 mL | Freq: Once | RESPIRATORY_TRACT | Status: AC
  Administered 2022-01-14: 6 mL via RESPIRATORY_TRACT
  Filled 2022-01-14 (×2): qty 3

## 2022-01-14 MED ORDER — DOXYCYCLINE HYCLATE 100 MG PO TABS
100.0000 mg | ORAL_TABLET | Freq: Two times a day (BID) | ORAL | 0 refills | Status: AC
Start: 2022-01-14 — End: 2022-01-19

## 2022-01-14 MED ORDER — DOXYCYCLINE HYCLATE 100 MG PO TABS
100.0000 mg | ORAL_TABLET | Freq: Once | ORAL | Status: AC
  Administered 2022-01-14: 100 mg via ORAL
  Filled 2022-01-14: qty 1

## 2022-01-14 MED ORDER — PREDNISONE 50 MG PO TABS: 50.0000 mg | ORAL_TABLET | Freq: Every day | ORAL | 0 refills | Status: AC

## 2022-01-14 NOTE — Discharge Instructions (Signed)
You are being discharged with 2 prescriptions, doxycycline antibiotics and prednisone steroids.  Please pick up his medications today.   Start taking the doxycycline this evening and take twice daily.  Start the prednisone tomorrow morning as we already gave you the dose for today while in the ED.  I sent a referral to the cardiology clinic and you will get a phone call to schedule this appointment to be seen by the cardiologists.  Return to the ED with any worsening symptoms in the meantime

## 2022-01-14 NOTE — ED Provider Notes (Signed)
There was a  [facility] Provider Note    Event Date/Time   First MD Initiated Contact with Patient 01/14/22 0451     (approximate)   History   Chest Pain   HPI  Ryan Hatfield is a 56 y.o. male who presents to the ED for evaluation of Chest Pain    I reviewed PCP visit from 3/2.  Obese patient with history of HTN, hypothyroidism, anxiety and depression, previous alcoholism.  COPD. 40-50pack year smoking hx.   Patient presents to the ED for evaluation of chest pain, shortness of breath and productive cough.  He reports his breathing has been bad "for a while" and he has had increased sputum production for couple days with increased shortness of breath.  Dyspnea on exertion.  Patient reports developing chest pain tonight while at work.  Works in a [employer], reports sharp left-sided chest pain that is worse with exertion and improved once he sat down and got some rest.  Reports about an hour of anterior chest pain, since resolved.  After he got to his car to come to [place], he reports the pain migrated to his back and has been persistent there ever since.  Tapering improving, but still present. Reports increased lower extremity edema.  No diuretics or history of CHF.  Physical Exam   Triage Vital Signs: ED Triage Vitals  Enc Vitals Group     BP 01/14/22 0205 (!) 142/54     Pulse Rate 01/14/22 0205 88     Resp 01/14/22 0205 19     Temp 01/14/22 0205 97.9 F (36.6 C)     Temp Source 01/14/22 0205 Oral     SpO2 01/14/22 0205 95 %     Weight 01/14/22 0203 270 lb (122.5 kg)     Height --      Head Circumference --      Peak Flow --      Pain Score 01/14/22 0203 6     Pain Loc --      Pain Edu? --      Excl. in [place]? --     Most recent vital signs: Vitals:   01/14/22 0530 01/14/22 0600  BP: (!) 133/59 (!) 116/57  Pulse:  68  Resp: (!) 27 18  Temp:    SpO2:  98%    General: Awake, no distress.  Obese.  Sitting up in bed and  well-appearing systemically. CV:  Good peripheral perfusion.  Resp:  Minimal tachypnea to the low 20s.  Diffuse expiratory wheezes and decreased airflow throughout.  No focal features. Abd:  No distention.  MSK:  No deformity noted.  Trace pitting edema to bilateral lower extremities with sock lines, no overlying skin changes Neuro:  No focal deficits appreciated. Other:     ED Results / Procedures / Treatments   Labs (all labs ordered are listed, but only abnormal results are displayed) Labs Reviewed  BASIC METABOLIC PANEL - Abnormal; Notable for the following components:      Result Value   Glucose, Bld 114 (*)    All other components within normal limits  CBC - Abnormal; Notable for the following components:   WBC 11.0 (*)    All other components within normal limits  BRAIN NATRIURETIC PEPTIDE  TROPONIN I (HIGH SENSITIVITY)  TROPONIN I (HIGH SENSITIVITY)    EKG Sinus rhythm with rate of 91 bpm.  1 PVC.  Normal axis and intervals.  No clear signs of acute ischemia.  RADIOLOGY CXR interpreted by me without evidence of acute cardiopulmonary pathology.  Official radiology report(s): DG Chest 1 View  Result Date: 01/14/2022 CLINICAL DATA:  Chest pain EXAM: CHEST  1 VIEW COMPARISON:  09/28/2017, 08/15/2019 FINDINGS: The heart size and mediastinal contours are within normal limits. Both lungs are clear. The visualized skeletal structures are unremarkable. IMPRESSION: No active disease. Electronically Signed   By: Donavan Foil M.D.   On: 01/14/2022 02:32    PROCEDURES and INTERVENTIONS:  .1-3 Lead EKG Interpretation  Performed by: Vladimir Crofts, MD Authorized by: Vladimir Crofts, MD     Interpretation: normal     ECG rate:  80   ECG rate assessment: normal     Rhythm: sinus rhythm     Ectopy: none     Conduction: normal     Medications  predniSONE (DELTASONE) tablet 60 mg (60 mg Oral Given 01/14/22 0608)  doxycycline (VIBRA-TABS) tablet 100 mg (100 mg Oral Given 01/14/22  0608)  ipratropium-albuterol (DUONEB) 0.5-2.5 (3) MG/3ML nebulizer solution 6 mL (6 mLs Nebulization Given 01/14/22 0612)     IMPRESSION / MDM / ASSESSMENT AND PLAN / ED COURSE  I reviewed the triage vital signs and the nursing notes.  Differential diagnosis includes, but is not limited to, COPD exacerbation, CHF exacerbation, NSTEMI, pneumonia  {Patient presents with symptoms of an acute illness or injury that is potentially life-threatening.  56 year old male with lifelong smoking history presents with chest pain and shortness of breath, likely symptomatic COPD exacerbation and ultimately suitable for outpatient management.  He is wheezing with stigmata of COPD exacerbation on exam.  No active chest pain.  EKG is nonischemic and troponin is negative.  CBC with marginal leukocytosis.  Metabolic panel is normal.  BNP is low.  X-ray is clear.  Despite clear x-ray, his increased sputum production and a believe would benefit from a few days of antibiotics.  He is started on doxycycline, steroids and breathing treatments with improvement of his symptoms.  As long as a secondary pulm is negative I anticipate he be suitable for outpatient management.  I considered observation admission for this patient, but he declines and would prefer outpatient cardiology follow-up.  Clinical Course as of 01/14/22 0729  Tue Jan 14, 2022  0648 Reassessed.  Patient reports feeling much better and is appreciative.  Reassessed and no longer wheezing.  We discussed pending second troponin and the possibility of discharge if this is reassuring.  We discussed the importance of close cardiology follow-up and return precautions for the ED.  He expresses understanding and agreement. [DS]    Clinical Course User Index [DS] Vladimir Crofts, MD     FINAL CLINICAL IMPRESSION(S) / ED DIAGNOSES   Final diagnoses:  COPD exacerbation ([employer])  Other chest pain  Smoking history     Rx / DC Orders   ED Discharge Orders           Ordered    Ambulatory referral to Cardiology       Comments: If you have not heard from the Cardiology office within the next 72 hours please call [phone].   01/14/22 0649    doxycycline (VIBRA-TABS) 100 MG tablet  2 times daily        01/14/22 0649    predniSONE (DELTASONE) 50 MG tablet  Daily        01/14/22 0649             Note:  This document was prepared using Dragon voice recognition software and may  include unintentional dictation errors.   Vladimir Crofts, MD 01/14/22 0730

## 2022-01-14 NOTE — ED Notes (Signed)
D/C, new RX, and reasons to return to ED discussed with pt, pt verbalized understanding. NAD noted on D/C.

## 2022-01-14 NOTE — ED Triage Notes (Addendum)
Pt arrives with c/o chest pain that started a few hours ago. Pt denies n/v. Pt endorses SOB. Per pt, pain radiates into his back.

## 2022-02-17 ENCOUNTER — Encounter: Payer: Self-pay | Admitting: Cardiology

## 2022-02-17 ENCOUNTER — Ambulatory Visit (INDEPENDENT_AMBULATORY_CARE_PROVIDER_SITE_OTHER): Payer: BC Managed Care – PPO | Admitting: Cardiology

## 2022-02-17 VITALS — BP 158/80 | HR 79 | Ht 69.0 in | Wt 265.0 lb

## 2022-02-17 DIAGNOSIS — E7800 Pure hypercholesterolemia, unspecified: Secondary | ICD-10-CM

## 2022-02-17 DIAGNOSIS — I10 Essential (primary) hypertension: Secondary | ICD-10-CM | POA: Diagnosis not present

## 2022-02-17 DIAGNOSIS — R072 Precordial pain: Secondary | ICD-10-CM | POA: Diagnosis not present

## 2022-02-17 DIAGNOSIS — F17200 Nicotine dependence, unspecified, uncomplicated: Secondary | ICD-10-CM

## 2022-02-17 DIAGNOSIS — R0683 Snoring: Secondary | ICD-10-CM

## 2022-02-17 MED ORDER — IVABRADINE HCL 5 MG PO TABS: 10.0000 mg | ORAL_TABLET | Freq: Once | ORAL | 0 refills | Status: AC

## 2022-02-17 MED ORDER — METOPROLOL TARTRATE 100 MG PO TABS: 100.0000 mg | ORAL_TABLET | Freq: Once | ORAL | 0 refills | Status: DC

## 2022-02-17 MED ORDER — ASPIRIN 81 MG PO TBEC: 81.0000 mg | DELAYED_RELEASE_TABLET | Freq: Every day | ORAL | 3 refills | Status: DC

## 2022-02-17 NOTE — Progress Notes (Addendum)
Cardiology Office Note:    Date:  02/17/2022   ID:  AHAMED HOFLAND, DOB [birth year], MRN [id number]  PCP:  Eugenia Pancoast, [place] [facility] Cardiologist:  Kate Sable, MD     Referring MD: Eugenia Pancoast, FNP   Chief Complaint  Patient presents with   NEW patient-Evaluation of chest pain   Pepper E Siravo is a 56 y.o. male who is being seen today for the evaluation of chest pain at the request of Eugenia Pancoast, [place].   History of Present Illness:    Khylan E Crossman is a 56 y.o. male with a hx of hypertension, hyperlipidemia, current smoker x40+ years who presents due to chest pain.  Describes symptoms of chest pain and shortness of breath ongoing over the past several months.  Symptoms usually associated with exertion.  Also endorses daytime fatigue, somnolence and snoring.  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  Last cholesterol check was about a year ago, takes medications as prescribed.  Past Medical History:  Diagnosis Date   Acute appendicitis    Alcohol abuse, in remission 05/29/2015   Anxiety and depression 05/29/2015   Arthritis, degenerative 02/03/2013   Overview:  Of right middle finger MCP joint    Cannot sleep 02/03/2013   Chest pain 01/15/2013   Hypertension    Plantar fasciitis of left foot 01/17/2016    Past Surgical History:  Procedure Laterality Date   LAPAROSCOPIC APPENDECTOMY N/A 11/25/2017   Procedure: APPENDECTOMY LAPAROSCOPIC;  Surgeon: Florene Glen, MD;  Location: [facility] ORS;  Service: General;  Laterality: N/A;   SEPTOPLASTY     TONSILLECTOMY      Current Medications: Current Meds  Medication Sig   albuterol (VENTOLIN HFA) 108 (90 Base) MCG/ACT inhaler TAKE 2 PUFFS BY MOUTH EVERY 6 HOURS AS NEEDED FOR WHEEZE OR SHORTNESS OF BREATH   aspirin EC 81 MG tablet Take 1 tablet (81 mg total) by mouth daily. Swallow whole.   atorvastatin (LIPITOR) 10 MG tablet TAKE 1 TABLET (10 MG TOTAL) BY MOUTH DAILY.  MUST SCHEDULE PHYSICAL   cholecalciferol (VITAMIN D3) 25 MCG (1000 UNIT) tablet Take 1,000 Units by mouth daily.   folic acid (FOLVITE) 1 MG tablet TAKE 1 TABLET BY MOUTH EVERY DAY   furosemide (LASIX) 20 MG tablet TAKE 1 TABLET (20 MG TOTAL) BY MOUTH DAILY. MUST SCHEDULE PHYSICAL   ivabradine (CORLANOR) 5 MG TABS tablet Take 2 tablets (10 mg total) by mouth once for 1 dose. Take 2 hours prior to your CT Scan.   levothyroxine (SYNTHROID) 50 MCG tablet TAKE 1 TABLET BY MOUTH EVERY DAY   losartan (COZAAR) 50 MG tablet TAKE 1 TABLET BY MOUTH EVERY DAY   meloxicam (MOBIC) 15 MG tablet Take 1 tablet (15 mg total) by mouth daily.   metoprolol tartrate (LOPRESSOR) 100 MG tablet Take 1 tablet (100 mg total) by mouth once for 1 dose. Take 2 hours prior to your CT scan.   Multiple Vitamin (MULTIVITAMIN WITH MINERALS) TABS tablet Take 1 tablet by mouth daily.   Omega-3 Fatty Acids (FISH OIL) 1000 MG CAPS Take 1,000 mg by mouth daily.   tiotropium (SPIRIVA HANDIHALER) 18 MCG inhalation capsule INHALE 1 CAPSULE VIA HANDIHALER ONCE DAILY AT THE SAME TIME EVERY DAY     Allergies:   Lisinopril, Paroxetine, and Sertraline hcl   Social History   Socioeconomic History   Marital status: Divorced    Spouse name: Not on file   Number  of children: Not on file   Years of education: Not on file   Highest education level: Not on file  Occupational History   Occupation: supervisor    Comment: [employer]  Tobacco Use   Smoking status: Every Day    Packs/day: 1.00    Years: 40.00    Total pack years: 40.00    Types: Cigarettes   Smokeless tobacco: Never  Vaping Use   Vaping Use: Former  Substance and Sexual Activity   Alcohol use: Yes    Comment: occassional   Drug use: No   Sexual activity: Yes    Partners: Female  Other Topics Concern   Not on file  Social History Narrative   Not on file   Social Determinants of Health   Financial Resource Strain: Not on file  Food Insecurity: Not on  file  Transportation Needs: Not on file  Physical Activity: Not on file  Stress: Not on file  Social Connections: Not on file     Family History: The patient's family history includes Arthritis in his mother; Cancer in his maternal grandmother; Diabetes in his brother; Heart disease in his father and mother; Kidney cancer in his paternal uncle; Prostate cancer in his paternal grandfather; Stroke in his brother.  ROS:   Please see the history of present illness.     All other systems reviewed and are negative.  EKGs/Labs/Other Studies Reviewed:    The following studies were reviewed today:   EKG:  EKG is  ordered today.  The ekg ordered today demonstrates normal sinus rhythm, normal ECG  Recent Labs: 08/07/2021: ALT 14 09/05/2021: TSH 3.20 01/14/2022: B Natriuretic Peptide 90.4; BUN 15; Creatinine, Ser 1.04; Hemoglobin 14.6; Platelets 242; Potassium 3.6; Sodium 137  Recent Lipid Panel    Component Value Date/Time   CHOL 204 (H) 08/30/2019 0914   CHOL 168 09/06/2015 0831   TRIG 200.0 (H) 08/30/2019 0914   HDL 30.20 (L) 08/30/2019 0914   HDL 27 (L) 09/06/2015 0831   CHOLHDL 7 08/30/2019 0914   VLDL 40.0 08/30/2019 0914   LDLCALC 134 (H) 08/30/2019 0914   LDLCALC 102 (H) 09/06/2015 0831   LDLDIRECT 123.0 06/11/2015 0846     Risk Assessment/Calculations:          Physical Exam:    VS:  BP (!) 158/80 (BP Location: Left Arm, Patient Position: Sitting, Cuff Size: Large)   Pulse 79   Ht '5\' 9"'$  (1.753 m)   Wt 265 lb (120.2 kg)   SpO2 96%   BMI 39.13 kg/m     Wt Readings from Last 3 Encounters:  02/17/22 265 lb (120.2 kg)  01/14/22 270 lb (122.5 kg)  09/05/21 258 lb 8 oz (117.3 kg)     GEN:  Well nourished, well developed in no acute distress HEENT: Normal NECK: No JVD; No carotid bruits CARDIAC: RRR, no murmurs, rubs, gallops RESPIRATORY: Diminished breath sounds, mild expiratory wheezing on left ABDOMEN: Soft, non-tender, non-distended MUSCULOSKELETAL:  No edema;  No deformity  SKIN: Warm and dry NEUROLOGIC:  Alert and oriented x 3 PSYCHIATRIC:  Normal affect   ASSESSMENT:    1. Precordial pain   2. Primary hypertension   3. Pure hypercholesterolemia   4. Smoking   5. Snoring    PLAN:    In order of problems listed above:  Chest pain, consistent with angina, get echo, get coronary CTA. Hypertension, BP elevated, previously reasonable.  Continue losartan, titrate losartan if BP stays elevated at follow-up visit. History  of hyperlipidemia, obtain fasting lipid profile, continue Lipitor '10mg'$ . Current smoker, cessation advised. Snoring, daytime fatigue, somnolence.  Findings consistent with OSA.  Plan referral to pulmonary medicine at follow-up visit.  Follow-up after cardiac testing.      Medication Adjustments/Labs and Tests Ordered: Current medicines are reviewed at length with the patient today.  Concerns regarding medicines are outlined above.  Orders Placed This Encounter  Procedures   CT CORONARY MORPH W/CTA COR W/SCORE W/CA W/CM &/OR WO/CM   Basic metabolic panel   Lipid panel   EKG 12-Lead   ECHOCARDIOGRAM COMPLETE   Meds ordered this encounter  Medications   metoprolol tartrate (LOPRESSOR) 100 MG tablet    Sig: Take 1 tablet (100 mg total) by mouth once for 1 dose. Take 2 hours prior to your CT scan.    Dispense:  1 tablet    Refill:  0   ivabradine (CORLANOR) 5 MG TABS tablet    Sig: Take 2 tablets (10 mg total) by mouth once for 1 dose. Take 2 hours prior to your CT Scan.    Dispense:  2 tablet    Refill:  0   aspirin EC 81 MG tablet    Sig: Take 1 tablet (81 mg total) by mouth daily. Swallow whole.    Dispense:  90 tablet    Refill:  3    Patient Instructions  Medication Instructions:   Your physician has recommended you make the following change in your medication:    START taking Aspirin 81 MG once a day.   *If you need a refill on your cardiac medications before your next appointment, please call your  pharmacy*   Lab Work:  Please go to the medical mall for a lab draw (BMP)  after your appointment today.  2.   Your physician recommends that you return for a FASTING lipid profile: AT [place] will need to be fasting. Please do not have anything to eat or drink after midnight the morning you have the lab work. You may only have water or black coffee with no cream or sugar.   - Please go to the [facility]. You will check in at the front desk to the right as you walk into the atrium. Valet Parking is offered if needed. - No appointment needed. You may go any day between 7 am and 6 pm.     Testing/Procedures:  Your physician has requested that you have an echocardiogram. Echocardiography is a painless test that uses sound waves to create images of your heart. It provides your doctor with information about the size and shape of your heart and how well your heart's chambers and valves are working. This procedure takes approximately one hour. There are no restrictions for this procedure.  2.   Your physician has requested that you have cardiac CT. Cardiac computed tomography (CT) is a painless test that uses an x-ray machine to take clear, detailed pictures of your heart.    Your cardiac CT will be scheduled at:  [facility] [address] [place], [place] [address] [phone]   Monday 02/24/22 at 8:00 AM  Please arrive 15 mins early for check-in and test prep.    Please follow these instructions carefully (unless otherwise directed):  Hold all erectile dysfunction medications at least 3 days (72 hrs) prior to test.   On the Night Before the Test: Be sure to Drink plenty of water. Do not  consume any caffeinated/decaffeinated beverages or chocolate 12 hours prior to your test.   On the Day of the Test: Drink plenty of water until 1 hour prior to the test. Do not eat any food 4 hours prior to the  test. You may take your regular medications prior to the test.  Take metoprolol (Lopressor) 100 MG two hours prior to test. Take Ivabradine (Corlanor) 10 MG two hours prior to test. HOLD Furosemide (Lasix) morning of the test.       After the Test: Drink plenty of water. After receiving IV contrast, you may experience a mild flushed feeling. This is normal. On occasion, you may experience a mild rash up to 24 hours after the test. This is not dangerous. If this occurs, you can take Benadryl 25 mg and increase your fluid intake. If you experience trouble breathing, this can be serious. If it is severe call 911 IMMEDIATELY. If it is mild, please call our office. If you take any of these medications: Glipizide/Metformin, Avandament, Glucavance, please do not take 48 hours after completing test unless otherwise instructed.  Please allow 2-4 weeks for scheduling of routine cardiac CTs. Some insurance companies require a pre-authorization which may delay scheduling of this test.   For non-scheduling related questions, please contact the cardiac imaging nurse navigator should you have any questions/concerns: Marchia Bond, Cardiac Imaging Nurse Navigator Gordy Clement, Cardiac Imaging Nurse Navigator  Heart and Vascular Services Direct Office Dial: [phone]   For scheduling needs, including cancellations and rescheduling, please call [place], [phone].    Follow-Up: At [facility], you and your health needs are our priority.  As part of our continuing mission to provide you with exceptional heart care, we have created designated Provider Care Teams.  These Care Teams include your primary Cardiologist (physician) and Advanced Practice Providers (APPs -  Physician Assistants and Nurse Practitioners) who all work together to provide you with the care you need, when you need it.  We recommend signing up for the patient portal called "MyChart".  Sign up information is provided  on this After Visit Summary.  MyChart is used to connect with patients for Virtual Visits (Telemedicine).  Patients are able to view lab/test results, encounter notes, upcoming appointments, etc.  Non-urgent messages can be sent to your provider as well.   To learn more about what you can do with MyChart, go to [email or web address].    Your next appointment:   Follow up after testing   The format for your next appointment:   In Person  Provider:   Kate Sable, MD    Other Instructions   Important Information About Sugar         Signed, Kate Sable, MD  02/17/2022 3:03 PM    [place]

## 2022-02-17 NOTE — Patient Instructions (Signed)
Medication Instructions:   Your physician has recommended you make the following change in your medication:    START taking Aspirin 81 MG once a day.   *If you need a refill on your cardiac medications before your next appointment, please call your pharmacy*   Lab Work:  Please go to the medical mall for a lab draw (BMP)  after your appointment today.  2.   Your physician recommends that you return for a FASTING lipid profile: AT [place] will need to be fasting. Please do not have anything to eat or drink after midnight the morning you have the lab work. You may only have water or black coffee with no cream or sugar.   - Please go to the [facility]. You will check in at the front desk to the right as you walk into the atrium. Valet Parking is offered if needed. - No appointment needed. You may go any day between 7 am and 6 pm.     Testing/Procedures:  Your physician has requested that you have an echocardiogram. Echocardiography is a painless test that uses sound waves to create images of your heart. It provides your doctor with information about the size and shape of your heart and how well your heart's chambers and valves are working. This procedure takes approximately one hour. There are no restrictions for this procedure.  2.   Your physician has requested that you have cardiac CT. Cardiac computed tomography (CT) is a painless test that uses an x-ray machine to take clear, detailed pictures of your heart.    Your cardiac CT will be scheduled at:  [facility] [address] [place],  [address] [phone]   Monday 02/24/22 at 8:00 AM  Please arrive 15 mins early for check-in and test prep.    Please follow these instructions carefully (unless otherwise directed):  Hold all erectile dysfunction medications at least 3 days (72 hrs) prior to test.   On the Night Before the Test: Be  sure to Drink plenty of water. Do not consume any caffeinated/decaffeinated beverages or chocolate 12 hours prior to your test.   On the Day of the Test: Drink plenty of water until 1 hour prior to the test. Do not eat any food 4 hours prior to the test. You may take your regular medications prior to the test.  Take metoprolol (Lopressor) 100 MG two hours prior to test. Take Ivabradine (Corlanor) 10 MG two hours prior to test. HOLD Furosemide (Lasix) morning of the test.       After the Test: Drink plenty of water. After receiving IV contrast, you may experience a mild flushed feeling. This is normal. On occasion, you may experience a mild rash up to 24 hours after the test. This is not dangerous. If this occurs, you can take Benadryl 25 mg and increase your fluid intake. If you experience trouble breathing, this can be serious. If it is severe call 911 IMMEDIATELY. If it is mild, please call our office. If you take any of these medications: Glipizide/Metformin, Avandament, Glucavance, please do not take 48 hours after completing test unless otherwise instructed.  Please allow 2-4 weeks for scheduling of routine cardiac CTs. Some insurance companies require a pre-authorization which may delay scheduling of this test.   For non-scheduling related questions, please contact the cardiac imaging nurse navigator should you have any questions/concerns: Marchia Bond, Cardiac Imaging Nurse Navigator Gordy Clement, Cardiac Imaging  Nurse [place] and Vascular Services Direct Office Dial: [phone]   For scheduling needs, including cancellations and rescheduling, please call [place], [phone].    Follow-Up: At [facility], you and your health needs are our priority.  As part of our continuing mission to provide you with exceptional heart care, we have created designated Provider Care Teams.  These Care Teams include your primary Cardiologist (physician) and Advanced  Practice Providers (APPs -  Physician Assistants and Nurse Practitioners) who all work together to provide you with the care you need, when you need it.  We recommend signing up for the patient portal called "MyChart".  Sign up information is provided on this After Visit Summary.  MyChart is used to connect with patients for Virtual Visits (Telemedicine).  Patients are able to view lab/test results, encounter notes, upcoming appointments, etc.  Non-urgent messages can be sent to your provider as well.   To learn more about what you can do with MyChart, go to [email or web address].    Your next appointment:   Follow up after testing   The format for your next appointment:   In Person  Provider:   Kate Sable, MD    Other Instructions   Important Information About Sugar

## 2022-02-21 ENCOUNTER — Telehealth (HOSPITAL_COMMUNITY): Payer: Self-pay | Admitting: *Deleted

## 2022-02-21 ENCOUNTER — Other Ambulatory Visit
Admission: RE | Admit: 2022-02-21 | Discharge: 2022-02-21 | Disposition: A | Discharge status: Home / Self Care | Payer: BC Managed Care – PPO | Attending: Cardiology | Admitting: Cardiology

## 2022-02-21 DIAGNOSIS — E7800 Pure hypercholesterolemia, unspecified: Secondary | ICD-10-CM | POA: Diagnosis not present

## 2022-02-21 DIAGNOSIS — R072 Precordial pain: Secondary | ICD-10-CM | POA: Insufficient documentation

## 2022-02-21 LAB — BASIC METABOLIC PANEL
Anion gap: 10 (ref 5–15)
BUN: 15 mg/dL (ref 6–20)
CO2: 27 mmol/L (ref 22–32)
Calcium: 9.2 mg/dL (ref 8.9–10.3)
Chloride: 104 mmol/L (ref 98–111)
Creatinine, Ser: 1.19 mg/dL (ref 0.61–1.24)
GFR, Estimated: >60 mL/min (ref >60)
Glucose, Bld: 91 mg/dL (ref 70–99)
Potassium: 3.7 mmol/L (ref 3.5–5.1)
Sodium: 141 mmol/L (ref 135–145)

## 2022-02-21 LAB — LIPID PANEL
Cholesterol: 168 mg/dL (ref 0–200)
HDL: 36 mg/dL — ABNORMAL LOW (ref >40)
LDL Cholesterol: 82 mg/dL (ref 0–99)
Total CHOL/HDL Ratio: 4.7 RATIO
Triglycerides: 250 mg/dL — ABNORMAL HIGH (ref <150)
VLDL: 50 mg/dL — ABNORMAL HIGH (ref 0–40)

## 2022-02-21 NOTE — Telephone Encounter (Signed)
Attempted to call patient regarding upcoming cardiac CT appointment. °Left message on voicemail with name and callback number ° °Jeoffrey Eleazer RN Navigator Cardiac Imaging °[facility] °[phone] Office °[phone] Cell ° °

## 2022-02-24 ENCOUNTER — Ambulatory Visit: Admission: RE | Admit: 2022-02-24 | Payer: BC Managed Care – PPO | Source: Ambulatory Visit

## 2022-02-25 ENCOUNTER — Ambulatory Visit (INDEPENDENT_AMBULATORY_CARE_PROVIDER_SITE_OTHER): Payer: BC Managed Care – PPO

## 2022-02-25 ENCOUNTER — Telehealth: Payer: Self-pay

## 2022-02-25 DIAGNOSIS — E782 Mixed hyperlipidemia: Secondary | ICD-10-CM

## 2022-02-25 DIAGNOSIS — R072 Precordial pain: Secondary | ICD-10-CM | POA: Diagnosis not present

## 2022-02-25 LAB — ECHOCARDIOGRAM COMPLETE
AR max vel: 2.16 cm2
AV Area VTI: 2.41 cm2
AV Area mean vel: 2.23 cm2
AV Mean grad: 11 mmHg
AV Peak grad: 22.7 mmHg
Ao pk vel: 2.38 m/s
Area-P 1/2: 3.91 cm2
Calc EF: 55.6 %
S' Lateral: 2.2 cm
Single Plane A2C EF: 55.9 %
Single Plane A4C EF: 58.1 %

## 2022-02-25 MED ORDER — ATORVASTATIN CALCIUM 40 MG PO TABS: 40.0000 mg | ORAL_TABLET | Freq: Every day | ORAL | 5 refills | Status: DC

## 2022-02-25 NOTE — Telephone Encounter (Signed)
-----   Message from Kate Sable, MD sent at 02/24/2022  5:03 PM EDT ----- Cholesterol elevated, increase Lipitor to 40 mg daily.

## 2022-02-25 NOTE — Telephone Encounter (Signed)
Called patient and left a detailed VM per DPR on file.Encouraged patient to call back with any questions or concerns.

## 2022-03-07 ENCOUNTER — Ambulatory Visit: Payer: BC Managed Care – PPO | Admitting: Cardiology

## 2022-03-11 ENCOUNTER — Encounter (HOSPITAL_COMMUNITY): Payer: Self-pay

## 2022-03-18 NOTE — Progress Notes (Deleted)
Cardiology Office Note:    Date:  03/18/2022   ID:  SABIR CHARTERS, DOB Jun 10, 1966, MRN [id number]  PCP:  Eugenia Pancoast, FNP  [facility] Cardiologist:  Kate Sable, MD  [facility] Electrophysiologist:  None   Referring MD: Eugenia Pancoast, FNP   Chief Complaint: follow-up CTA  History of Present Illness:    Ryan Hatfield is a 56 y.o. male with a hx of HTN, HLD, tobacco use who presents for chest pain.   Last seen 02/17/22 as a new patient for chest pain and SOB. Echo and Coronary CTA were ordered. BP Was elevated. He was referred to pulmonology for OSA.  Echo showed LVEF 60-65%, mild LVH, G1DD. Cardiac CTA was not done.   Today,     Past Medical History:  Diagnosis Date   Acute appendicitis    Alcohol abuse, in remission 05/29/2015   Anxiety and depression 05/29/2015   Arthritis, degenerative 02/03/2013   Overview:  Of right middle finger MCP joint    Cannot sleep 02/03/2013   Chest pain 01/15/2013   Hypertension    Plantar fasciitis of left foot 01/17/2016    Past Surgical History:  Procedure Laterality Date   LAPAROSCOPIC APPENDECTOMY N/A 11/25/2017   Procedure: APPENDECTOMY LAPAROSCOPIC;  Surgeon: Florene Glen, MD;  Location: [facility] ORS;  Service: General;  Laterality: N/A;   SEPTOPLASTY     TONSILLECTOMY      Current Medications: No outpatient medications have been marked as taking for the 03/19/22 encounter (Appointment) with Kathlen Mody, Beryle Bagsby H, PA-C.     Allergies:   Lisinopril, Paroxetine, and Sertraline hcl   Social History   Socioeconomic History   Marital status: Divorced    Spouse name: Not on file   Number of children: Not on file   Years of education: Not on file   Highest education level: Not on file  Occupational History   Occupation: Librarian, academic    Comment: [employer]  Tobacco Use   Smoking status: Every Day    Packs/day: 1.00    Years: 40.00    Total pack years: 40.00    Types: Cigarettes   Smokeless tobacco:  Never  Vaping Use   Vaping Use: Former  Substance and Sexual Activity   Alcohol use: Yes    Comment: occassional   Drug use: No   Sexual activity: Yes    Partners: Female  Other Topics Concern   Not on file  Social History Narrative   Not on file   Social Determinants of Health   Financial Resource Strain: Not on file  Food Insecurity: Not on file  Transportation Needs: Not on file  Physical Activity: Not on file  Stress: Not on file  Social Connections: Not on file     Family History: The patient's family history includes Arthritis in his mother; Cancer in his maternal grandmother; Diabetes in his brother; Heart disease in his father and mother; Kidney cancer in his paternal uncle; Prostate cancer in his paternal grandfather; Stroke in his brother.  ROS:   Please see the history of present illness.     All other systems reviewed and are negative.  EKGs/Labs/Other Studies Reviewed:    The following studies were reviewed today:  Echo 02/2022  1. Left ventricular ejection fraction, by estimation, is 60 to 65%. The  left ventricle has normal function. The left ventricle has no regional  wall motion abnormalities. There is mild left ventricular hypertrophy.  LVOT gradient with valsalva 29 mm Hg.  Left  ventricular diastolic parameters are consistent with Grade I  diastolic dysfunction (impaired relaxation).   2. Right ventricular systolic function is normal. The right ventricular  size is normal.   3. Left atrial size was mildly dilated.   4. The mitral valve is normal in structure. No evidence of mitral valve  regurgitation. No evidence of mitral stenosis.   5. The aortic valve is normal in structure. Aortic valve regurgitation is  not visualized. No aortic stenosis is present.   6. The inferior vena cava is normal in size with greater than 50%  respiratory variability, suggesting right atrial pressure of 3 mmHg.   EKG:  EKG is *** ordered today.  The ekg ordered today  demonstrates ***  Recent Labs: 08/07/2021: ALT 14 09/05/2021: TSH 3.20 01/14/2022: B Natriuretic Peptide 90.4; Hemoglobin 14.6; Platelets 242 02/21/2022: BUN 15; Creatinine, Ser 1.19; Potassium 3.7; Sodium 141  Recent Lipid Panel    Component Value Date/Time   CHOL 168 02/21/2022 0938   CHOL 168 09/06/2015 0831   TRIG 250 (H) 02/21/2022 0938   HDL 36 (L) 02/21/2022 0938   HDL 27 (L) 09/06/2015 0831   CHOLHDL 4.7 02/21/2022 0938   VLDL 50 (H) 02/21/2022 0938   LDLCALC 82 02/21/2022 0938   LDLCALC 102 (H) 09/06/2015 0831   LDLDIRECT 123.0 06/11/2015 0846     Risk Assessment/Calculations:   {Does this patient have ATRIAL FIBRILLATION?:[id number]}   Physical Exam:    VS:  There were no vitals taken for this visit.    Wt Readings from Last 3 Encounters:  02/17/22 265 lb (120.2 kg)  01/14/22 270 lb (122.5 kg)  09/05/21 258 lb 8 oz (117.3 kg)     GEN: *** Well nourished, well developed in no acute distress HEENT: Normal NECK: No JVD; No carotid bruits LYMPHATICS: No lymphadenopathy CARDIAC: ***RRR, no murmurs, rubs, gallops RESPIRATORY:  Clear to auscultation without rales, wheezing or rhonchi  ABDOMEN: Soft, non-tender, non-distended MUSCULOSKELETAL:  No edema; No deformity  SKIN: Warm and dry NEUROLOGIC:  Alert and oriented x 3 PSYCHIATRIC:  Normal affect   ASSESSMENT:    No diagnosis found. PLAN:    In order of problems listed above:  Chest pain  HTN  HLD  Tobacco use  OSA  Disposition: Follow up {follow up:15908} with ***   Shared Decision Making/Informed Consent   {Are you ordering a CV Procedure (e.g. stress test, cath, DCCV, TEE, etc)?   Press F2        :[id number]}    Signed, Belle Charlie Ninfa Meeker, PA-C  03/18/2022 1:47 PM    [facility]

## 2022-03-19 ENCOUNTER — Ambulatory Visit: Payer: BC Managed Care – PPO | Admitting: Medical

## 2022-03-23 NOTE — Progress Notes (Deleted)
Cardiology Office Note:    Date:  03/23/2022   ID:  Ryan Hatfield, DOB 09-Jul-1965, MRN [id number]  PCP:  Eugenia Pancoast, FNP  [facility] Cardiologist:  Kate Sable, MD  [facility] Electrophysiologist:  None   Referring MD: Eugenia Pancoast, FNP   Chief Complaint: testing follow-up  History of Present Illness:    Ryan Hatfield is a 56 y.o. male with a hx of HTN, HLD, tobacco use who presents for follow-up.   Last seen 02/17/22 for chest pain and SOB. Coronary CTA was ordered.   Past Medical History:  Diagnosis Date   Acute appendicitis    Alcohol abuse, in remission 05/29/2015   Anxiety and depression 05/29/2015   Arthritis, degenerative 02/03/2013   Overview:  Of right middle finger MCP joint    Cannot sleep 02/03/2013   Chest pain 01/15/2013   Hypertension    Plantar fasciitis of left foot 01/17/2016    Past Surgical History:  Procedure Laterality Date   LAPAROSCOPIC APPENDECTOMY N/A 11/25/2017   Procedure: APPENDECTOMY LAPAROSCOPIC;  Surgeon: Florene Glen, MD;  Location: [facility] ORS;  Service: General;  Laterality: N/A;   SEPTOPLASTY     TONSILLECTOMY      Current Medications: No outpatient medications have been marked as taking for the 03/24/22 encounter (Appointment) with Kathlen Mody, Viveka Wilmeth H, PA-C.     Allergies:   Lisinopril, Paroxetine, and Sertraline hcl   Social History   Socioeconomic History   Marital status: Divorced    Spouse name: Not on file   Number of children: Not on file   Years of education: Not on file   Highest education level: Not on file  Occupational History   Occupation: Librarian, academic    Comment: [employer]  Tobacco Use   Smoking status: Every Day    Packs/day: 1.00    Years: 40.00    Total pack years: 40.00    Types: Cigarettes   Smokeless tobacco: Never  Vaping Use   Vaping Use: Former  Substance and Sexual Activity   Alcohol use: Yes    Comment: occassional   Drug use: No   Sexual activity: Yes     Partners: Female  Other Topics Concern   Not on file  Social History Narrative   Not on file   Social Determinants of Health   Financial Resource Strain: Not on file  Food Insecurity: Not on file  Transportation Needs: Not on file  Physical Activity: Not on file  Stress: Not on file  Social Connections: Not on file     Family History: The patient's ***family history includes Arthritis in his mother; Cancer in his maternal grandmother; Diabetes in his brother; Heart disease in his father and mother; Kidney cancer in his paternal uncle; Prostate cancer in his paternal grandfather; Stroke in his brother.  ROS:   Please see the history of present illness.    *** All other systems reviewed and are negative.  EKGs/Labs/Other Studies Reviewed:    The following studies were reviewed today: ***  EKG:  EKG is *** ordered today.  The ekg ordered today demonstrates ***  Recent Labs: 08/07/2021: ALT 14 09/05/2021: TSH 3.20 01/14/2022: B Natriuretic Peptide 90.4; Hemoglobin 14.6; Platelets 242 02/21/2022: BUN 15; Creatinine, Ser 1.19; Potassium 3.7; Sodium 141  Recent Lipid Panel    Component Value Date/Time   CHOL 168 02/21/2022 0938   CHOL 168 09/06/2015 0831   TRIG 250 (H) 02/21/2022 0938   HDL 36 (L) 02/21/2022 0938   HDL  27 (L) 09/06/2015 0831   CHOLHDL 4.7 02/21/2022 0938   VLDL 50 (H) 02/21/2022 0938   LDLCALC 82 02/21/2022 0938   LDLCALC 102 (H) 09/06/2015 0831   LDLDIRECT 123.0 06/11/2015 0846     Risk Assessment/Calculations:   {Does this patient have ATRIAL FIBRILLATION?:([id number]}   Physical Exam:    VS:  There were no vitals taken for this visit.    Wt Readings from Last 3 Encounters:  02/17/22 265 lb (120.2 kg)  01/14/22 270 lb (122.5 kg)  09/05/21 258 lb 8 oz (117.3 kg)     GEN: *** Well nourished, well developed in no acute distress HEENT: Normal NECK: No JVD; No carotid bruits LYMPHATICS: No lymphadenopathy CARDIAC: ***RRR, no murmurs, rubs,  gallops RESPIRATORY:  Clear to auscultation without rales, wheezing or rhonchi  ABDOMEN: Soft, non-tender, non-distended MUSCULOSKELETAL:  No edema; No deformity  SKIN: Warm and dry NEUROLOGIC:  Alert and oriented x 3 PSYCHIATRIC:  Normal affect   ASSESSMENT:    No diagnosis found. PLAN:    In order of problems listed above:  ***  Disposition: Follow up {follow up:15908} with ***   Shared Decision Making/Informed Consent   {Are you ordering a CV Procedure (e.g. stress test, cath, DCCV, TEE, etc)?   Press F2        :[id number]}    Signed, Derrin Currey Ninfa Meeker, PA-C  03/23/2022 10:11 PM    [facility]

## 2022-03-24 ENCOUNTER — Ambulatory Visit: Payer: BC Managed Care – PPO | Admitting: Medical

## 2022-04-10 ENCOUNTER — Other Ambulatory Visit: Payer: Self-pay | Admitting: Family

## 2022-04-10 DIAGNOSIS — I10 Essential (primary) hypertension: Secondary | ICD-10-CM

## 2022-05-23 ENCOUNTER — Other Ambulatory Visit: Payer: Self-pay | Admitting: Family

## 2022-05-23 DIAGNOSIS — E538 Deficiency of other specified B group vitamins: Secondary | ICD-10-CM

## 2022-09-25 ENCOUNTER — Telehealth: Payer: Self-pay | Admitting: Family

## 2022-09-25 NOTE — Telephone Encounter (Signed)
Type of forms received: employment ppw   Routed to: [employer]   Paperwork received by : Armandina Stammer    Individual made aware of 3-5 business day turn around (Y/N): Y   Form completed and patient made aware of charges(Y/N): Y    Faxed to : pt requested call back @ [phone] once comp  Form location:  dugal's folder  Pt aware that last ov was 09/05/21, pt scheduled f/u appt for tomorrow, 3/22.

## 2022-09-26 ENCOUNTER — Ambulatory Visit (INDEPENDENT_AMBULATORY_CARE_PROVIDER_SITE_OTHER): Payer: Self-pay | Admitting: Family

## 2022-09-26 ENCOUNTER — Encounter: Payer: Self-pay | Admitting: Family

## 2022-09-26 VITALS — BP 142/78 | HR 100 | Temp 99.3°F | Ht 69.0 in | Wt 278.2 lb

## 2022-09-26 DIAGNOSIS — E538 Deficiency of other specified B group vitamins: Secondary | ICD-10-CM

## 2022-09-26 DIAGNOSIS — E782 Mixed hyperlipidemia: Secondary | ICD-10-CM

## 2022-09-26 DIAGNOSIS — J449 Chronic obstructive pulmonary disease, unspecified: Secondary | ICD-10-CM

## 2022-09-26 DIAGNOSIS — J4520 Mild intermittent asthma, uncomplicated: Secondary | ICD-10-CM

## 2022-09-26 DIAGNOSIS — I10 Essential (primary) hypertension: Secondary | ICD-10-CM

## 2022-09-26 DIAGNOSIS — M6208 Separation of muscle (nontraumatic), other site: Secondary | ICD-10-CM

## 2022-09-26 DIAGNOSIS — E039 Hypothyroidism, unspecified: Secondary | ICD-10-CM

## 2022-09-26 DIAGNOSIS — M1990 Unspecified osteoarthritis, unspecified site: Secondary | ICD-10-CM

## 2022-09-26 DIAGNOSIS — G479 Sleep disorder, unspecified: Secondary | ICD-10-CM

## 2022-09-26 MED ORDER — ATORVASTATIN CALCIUM 40 MG PO TABS: 40.0000 mg | ORAL_TABLET | Freq: Every day | ORAL | 5 refills | Status: DC

## 2022-09-26 MED ORDER — TRAZODONE HCL 50 MG PO TABS: 25.0000 mg | ORAL_TABLET | Freq: Every evening | ORAL | 3 refills | Status: DC | PRN

## 2022-09-26 MED ORDER — TIOTROPIUM BROMIDE MONOHYDRATE 18 MCG IN CAPS: ORAL_CAPSULE | RESPIRATORY_TRACT | 5 refills | Status: DC

## 2022-09-26 MED ORDER — MELOXICAM 15 MG PO TABS: 15.0000 mg | ORAL_TABLET | Freq: Every day | ORAL | 3 refills | Status: DC

## 2022-09-26 MED ORDER — LEVOTHYROXINE SODIUM 50 MCG PO TABS: 50.0000 ug | ORAL_TABLET | Freq: Every day | ORAL | 3 refills | Status: DC

## 2022-09-26 MED ORDER — ALBUTEROL SULFATE HFA 108 (90 BASE) MCG/ACT IN AERS: INHALATION_SPRAY | RESPIRATORY_TRACT | 1 refills | Status: DC

## 2022-09-26 MED ORDER — FUROSEMIDE 20 MG PO TABS: 20.0000 mg | ORAL_TABLET | Freq: Every day | ORAL | 0 refills | Status: DC

## 2022-09-26 MED ORDER — LOSARTAN POTASSIUM 100 MG PO TABS: 100.0000 mg | ORAL_TABLET | Freq: Every day | ORAL | 3 refills | Status: DC

## 2022-09-26 NOTE — Assessment & Plan Note (Signed)
Pt without insurance, want him to continue levothyroxine as prescribed but once again with insurance will need to recheck tsh

## 2022-09-26 NOTE — Patient Instructions (Signed)
  Start monitoring your blood pressure daily, around the same time of day, for the next 2-3 weeks.  Ensure that you have rested for 30 minutes prior to checking your blood pressure. Record your readings and bring them to your next visit.   Regards,   Jacalynn Buzzell FNP-C  

## 2022-09-26 NOTE — Assessment & Plan Note (Addendum)
Increase losartan to 100 mg once daily. Goal is good for clearance with his work, as their goal is <160/100 which he meets these parameters. Echo reviewed from 8/23.EKG nsr  Pt advised of the following:  Continue medication as prescribed. Monitor blood pressure periodically and/or when you feel symptomatic. Goal is <130/90 on average. Ensure that you have rested for 30 minutes prior to checking your blood pressure. Record your readings and bring them to your next visit if necessary.work on a low sodium diet.

## 2022-09-26 NOTE — Assessment & Plan Note (Signed)
Non tender easily reducible  Can consider physical therapy in future but right now asymptomatic Advised pt to use proper body mechanics when lifting

## 2022-09-26 NOTE — Telephone Encounter (Signed)
Forms received, pt hs an appt today.

## 2022-09-26 NOTE — Assessment & Plan Note (Signed)
Continue inhaler, stable

## 2022-09-26 NOTE — Progress Notes (Signed)
Established Patient Office Visit  Subjective:      CC:  Chief Complaint  Patient presents with   Hypertension    Follow up for employment paperwork.    HPI: Ryan Hatfield is a 57 y.o. male presenting on 09/26/2022 for Hypertension (Follow up for employment paperwork.) .   New complaints: HTN: was seen by occupational health trying to apply for a new job, and his blood pressure was elevated on two checks, requiring him to come in to reassess blood pressure as well as abdominal diastasis. Today blood pressure is 158/82, he does state took losartan 50 mg once this am. Blood pressure at work was 168/85. No cp palp and or sob.   Abdominal diastasis, denies pain when he is lifting anything heavy.   Pt without insurance at current.   Murmur on exam, pt seen by cardiologist 8/23 and echo completed 02/25/22 with normal systolic function, impaired Grade 2 diastolic dysfunction, per cardiology likely r/t overweight and htn.   Social history:  Relevant past medical, surgical, family and social history reviewed and updated as indicated. Interim medical history since our last visit reviewed.  Allergies and medications reviewed and updated.  DATA REVIEWED: CHART IN EPIC     ROS: Negative unless specifically indicated above in HPI.    Current Outpatient Medications:    aspirin EC 81 MG tablet, Take 1 tablet (81 mg total) by mouth daily. Swallow whole., Disp: 90 tablet, Rfl: 3   cholecalciferol (VITAMIN D3) 25 MCG (1000 UNIT) tablet, Take 1,000 Units by mouth daily., Disp: , Rfl:    folic acid (FOLVITE) 1 MG tablet, TAKE 1 TABLET BY MOUTH EVERY DAY, Disp: 90 tablet, Rfl: 1   losartan (COZAAR) 100 MG tablet, Take 1 tablet (100 mg total) by mouth daily., Disp: 90 tablet, Rfl: 3   Multiple Vitamin (MULTIVITAMIN WITH MINERALS) TABS tablet, Take 1 tablet by mouth daily., Disp: , Rfl:    Omega-3 Fatty Acids (FISH OIL) 1000 MG CAPS, Take 1,000 mg by mouth daily., Disp: , Rfl:     traZODone (DESYREL) 50 MG tablet, Take 0.5-1 tablets (25-50 mg total) by mouth at bedtime as needed for sleep., Disp: 30 tablet, Rfl: 3   albuterol (VENTOLIN HFA) 108 (90 Base) MCG/ACT inhaler, TAKE 2 PUFFS BY MOUTH EVERY 6 HOURS AS NEEDED FOR WHEEZE OR SHORTNESS OF BREATH, Disp: 8.5 each, Rfl: 1   atorvastatin (LIPITOR) 40 MG tablet, Take 1 tablet (40 mg total) by mouth daily., Disp: 30 tablet, Rfl: 5   furosemide (LASIX) 20 MG tablet, Take 1 tablet (20 mg total) by mouth daily. MUST SCHEDULE PHYSICAL, Disp: 90 tablet, Rfl: 0   levothyroxine (SYNTHROID) 50 MCG tablet, Take 1 tablet (50 mcg total) by mouth daily., Disp: 90 tablet, Rfl: 3   meloxicam (MOBIC) 15 MG tablet, Take 1 tablet (15 mg total) by mouth daily., Disp: 90 tablet, Rfl: 3   tiotropium (SPIRIVA HANDIHALER) 18 MCG inhalation capsule, INHALE 1 CAPSULE VIA HANDIHALER ONCE DAILY AT THE SAME TIME EVERY DAY, Disp: 30 capsule, Rfl: 5      Objective:    BP (!) 142/78   Pulse 100   Temp 99.3 F (37.4 C) (Temporal)   Ht 5\' 9"  (1.753 m)   Wt 278 lb 3.2 oz (126.2 kg)   SpO2 98%   BMI 41.08 kg/m   Wt Readings from Last 3 Encounters:  09/26/22 278 lb 3.2 oz (126.2 kg)  02/17/22 265 lb (120.2 kg)  01/14/22 270 lb (122.5 kg)  Physical Exam Constitutional:      General: He is not in acute distress.    Appearance: Normal appearance. He is obese. He is not ill-appearing, toxic-appearing or diaphoretic.  Cardiovascular:     Rate and Rhythm: Normal rate.     Heart sounds: Murmur heard.  Pulmonary:     Effort: Pulmonary effort is normal.  Abdominal:     Tenderness: There is no abdominal tenderness.     Comments: Diastasis recti central however nontender, slight protrusion, easily reducible  Musculoskeletal:        General: Normal range of motion.     Right lower leg: No edema.     Left lower leg: No edema.  Neurological:     General: No focal deficit present.     Mental Status: He is alert and oriented to person, place, and  time. Mental status is at baseline.  Psychiatric:        Mood and Affect: Mood normal.        Behavior: Behavior normal.        Thought Content: Thought content normal.        Judgment: Judgment normal.          Assessment & Plan:  Mild intermittent reactive airway disease without complication -     Albuterol Sulfate HFA; TAKE 2 PUFFS BY MOUTH EVERY 6 HOURS AS NEEDED FOR WHEEZE OR SHORTNESS OF BREATH  Dispense: 8.5 each; Refill: 1  Mixed hyperlipidemia -     Atorvastatin Calcium; Take 1 tablet (40 mg total) by mouth daily.  Dispense: 30 tablet; Refill: 5  Vitamin B12 deficiency  Essential hypertension Assessment & Plan: Increase losartan to 100 mg once daily. Goal is good for clearance with his work, as their goal is <160/100 which he meets these parameters. Echo reviewed from 8/23.EKG nsr  Pt advised of the following:  Continue medication as prescribed. Monitor blood pressure periodically and/or when you feel symptomatic. Goal is <130/90 on average. Ensure that you have rested for 30 minutes prior to checking your blood pressure. Record your readings and bring them to your next visit if necessary.work on a low sodium diet.   Orders: -     Furosemide; Take 1 tablet (20 mg total) by mouth daily. MUST SCHEDULE PHYSICAL  Dispense: 90 tablet; Refill: 0 -     Losartan Potassium; Take 1 tablet (100 mg total) by mouth daily.  Dispense: 90 tablet; Refill: 3  Chronic obstructive pulmonary disease, unspecified COPD type ([place]) Assessment & Plan: Continue inhaler, stable  Orders: -     Tiotropium Bromide Monohydrate; INHALE 1 CAPSULE VIA HANDIHALER ONCE DAILY AT THE SAME TIME EVERY DAY  Dispense: 30 capsule; Refill: 5  Acquired hypothyroidism Assessment & Plan: Pt without insurance, want him to continue levothyroxine as prescribed but once again with insurance will need to recheck tsh   Orders: -     Levothyroxine Sodium; Take 1 tablet (50 mcg total) by mouth daily.  Dispense: 90  tablet; Refill: 3  Osteoarthritis, unspecified osteoarthritis type, unspecified site -     Meloxicam; Take 1 tablet (15 mg total) by mouth daily.  Dispense: 90 tablet; Refill: 3  Diastasis of rectus abdominis Assessment & Plan: Non tender easily reducible  Can consider physical therapy in future but right now asymptomatic Advised pt to use proper body mechanics when lifting    Sleep disorder -     traZODone HCl; Take 0.5-1 tablets (25-50 mg total) by mouth at bedtime as needed for sleep.  Dispense: 30 tablet; Refill: 3     Return in about 3 months (around 12/27/2022) for f/u blood pressure once with insurance again .  Eugenia Pancoast, MSN, APRN, FNP-C [place]

## 2022-10-21 ENCOUNTER — Ambulatory Visit (INDEPENDENT_AMBULATORY_CARE_PROVIDER_SITE_OTHER): Payer: Self-pay | Admitting: Family

## 2022-10-21 ENCOUNTER — Encounter: Payer: Self-pay | Admitting: Family

## 2022-10-21 VITALS — BP 136/76 | HR 80 | Temp 97.8°F | Ht 69.0 in | Wt 278.8 lb

## 2022-10-21 DIAGNOSIS — D72829 Elevated white blood cell count, unspecified: Secondary | ICD-10-CM

## 2022-10-21 DIAGNOSIS — R339 Retention of urine, unspecified: Secondary | ICD-10-CM | POA: Insufficient documentation

## 2022-10-21 DIAGNOSIS — N3001 Acute cystitis with hematuria: Secondary | ICD-10-CM | POA: Insufficient documentation

## 2022-10-21 LAB — POC URINALSYSI DIPSTICK (AUTOMATED)
Bilirubin, UA: NEGATIVE
Blood, UA: POSITIVE
Glucose, UA: NEGATIVE
Ketones, UA: NEGATIVE
Nitrite, UA: NEGATIVE
Protein, UA: POSITIVE — AB
Spec Grav, UA: 1.015 (ref 1.010–1.025)
Urobilinogen, UA: 0.2 E.U./dL
pH, UA: 6 (ref 5.0–8.0)

## 2022-10-21 MED ORDER — CIPROFLOXACIN HCL 500 MG PO TABS: 500.0000 mg | ORAL_TABLET | Freq: Two times a day (BID) | ORAL | 0 refills | Status: AC

## 2022-10-21 NOTE — Assessment & Plan Note (Signed)
Suspected UTI  Rx cipro 500 mg po bid x 10 days Suspected uti vs kidney stone vs prostatitis  Desire to order cbc cmp and psa however pt declines due to cost being without insurance.  Pending results of urine cutlure.  Did advise pt of red flag symptoms to monitor for more urgent care/er precautions

## 2022-10-21 NOTE — Patient Instructions (Signed)
It was a pleasure seeing you today.   You were found to have a urinary tract infection, you have been prescribed an antibiotic to your preferred pharmacy. Please start antibiotic today as directed.   We are sending your urine for a culture to make sure you do not have a resistant bacteria. We will call you if we need to change your medications.   Please make sure you are drinking plenty of fluids over the next few days.  If your symptoms do not improve over the next 5-7 days, or if they worsen, please let us know. Please also let us know if you have worsening back pain, fevers, chills, or body aches.   Regards,   Harlo Fabela  

## 2022-10-21 NOTE — Progress Notes (Unsigned)
Established Patient Office Visit  Subjective:   Patient ID: Ryan Hatfield, male    DOB: 06/23/1966  Age: 57 y.o. MRN: [id number]  CC:  Chief Complaint  Patient presents with   Dysuria    HPI: Ryan Hatfield is a 57 y.o. male presenting on 10/21/2022 for Dysuria   Dysuria     Yesterday he noticed it was burning when he peed, today better than yesterday, but also with urinary frequency. Was up all night last night trying to pee without only short bursts, also with urinary urgency. Not noticing any blood in the urine but it is dark in color.   Does not drink a good amount of water but does not typically drink water daily.  He doesn't drink soda. Did have some back pain yesterday, was in bil kidneys was dull and achy.  The pain yesterday was constant.   No lower abdominal pain.  Last night felt like he had the chills.        ROS: Negative unless specifically indicated above in HPI.   Relevant past medical history reviewed and updated as indicated.   Allergies and medications reviewed and updated.   Current Outpatient Medications:    albuterol (VENTOLIN HFA) 108 (90 Base) MCG/ACT inhaler, TAKE 2 PUFFS BY MOUTH EVERY 6 HOURS AS NEEDED FOR WHEEZE OR SHORTNESS OF BREATH, Disp: 8.5 each, Rfl: 1   aspirin EC 81 MG tablet, Take 1 tablet (81 mg total) by mouth daily. Swallow whole., Disp: 90 tablet, Rfl: 3   atorvastatin (LIPITOR) 40 MG tablet, Take 1 tablet (40 mg total) by mouth daily., Disp: 30 tablet, Rfl: 5   cholecalciferol (VITAMIN D3) 25 MCG (1000 UNIT) tablet, Take 1,000 Units by mouth daily., Disp: , Rfl:    ciprofloxacin (CIPRO) 500 MG tablet, Take 1 tablet (500 mg total) by mouth 2 (two) times daily for 14 days., Disp: 28 tablet, Rfl: 0   folic acid (FOLVITE) 1 MG tablet, TAKE 1 TABLET BY MOUTH EVERY DAY, Disp: 90 tablet, Rfl: 1   furosemide (LASIX) 20 MG tablet, Take 1 tablet (20 mg total) by mouth daily. MUST SCHEDULE PHYSICAL, Disp: 90 tablet, Rfl: 0    levothyroxine (SYNTHROID) 50 MCG tablet, Take 1 tablet (50 mcg total) by mouth daily., Disp: 90 tablet, Rfl: 3   losartan (COZAAR) 100 MG tablet, Take 1 tablet (100 mg total) by mouth daily., Disp: 90 tablet, Rfl: 3   meloxicam (MOBIC) 15 MG tablet, Take 1 tablet (15 mg total) by mouth daily., Disp: 90 tablet, Rfl: 3   Multiple Vitamin (MULTIVITAMIN WITH MINERALS) TABS tablet, Take 1 tablet by mouth daily., Disp: , Rfl:    Omega-3 Fatty Acids (FISH OIL) 1000 MG CAPS, Take 1,000 mg by mouth daily., Disp: , Rfl:    tiotropium (SPIRIVA HANDIHALER) 18 MCG inhalation capsule, INHALE 1 CAPSULE VIA HANDIHALER ONCE DAILY AT THE SAME TIME EVERY DAY, Disp: 30 capsule, Rfl: 5   traZODone (DESYREL) 50 MG tablet, Take 0.5-1 tablets (25-50 mg total) by mouth at bedtime as needed for sleep., Disp: 30 tablet, Rfl: 3  Allergies  Allergen Reactions   Lisinopril Cough   Paroxetine     REACTION: paranoia, confusion   Sertraline Hcl     REACTION: paranoia, confusion    Objective:   BP 136/76   Pulse 80   Temp 97.8 F (36.6 C) (Temporal)   Ht  (1.753 m)   Wt 278 lb 12.8 oz (126.5 kg)   SpO2 98%  BMI 41.17 kg/m    Physical Exam Vitals reviewed.  Constitutional:      General: He is not in acute distress.    Appearance: Normal appearance. He is normal weight. He is not ill-appearing, toxic-appearing or diaphoretic.  Cardiovascular:     Rate and Rhythm: Normal rate.  Pulmonary:     Effort: Pulmonary effort is normal.  Abdominal:     Tenderness: There is no abdominal tenderness. There is no right CVA tenderness or left CVA tenderness.  Musculoskeletal:        General: Normal range of motion.  Neurological:     General: No focal deficit present.     Mental Status: He is alert and oriented to person, place, and time. Mental status is at baseline.  Psychiatric:        Mood and Affect: Mood normal.        Behavior: Behavior normal.        Thought Content: Thought content normal.         Judgment: Judgment normal.     Assessment & Plan:  Urinary retention -     Urine Culture -     POCT Urinalysis Dipstick (Automated)  Leukocytosis, unspecified type -     Urine Culture  Acute cystitis with hematuria Assessment & Plan: Suspected UTI  Rx cipro 500 mg po bid x 10 days Suspected uti vs kidney stone vs prostatitis  Desire to order cbc cmp and psa however pt declines due to cost being without insurance.  Pending results of urine cutlure.  Did advise pt of red flag symptoms to monitor for more urgent care/er precautions  Orders: -     Ciprofloxacin HCl; Take 1 tablet (500 mg total) by mouth 2 (two) times daily for 14 days.  Dispense: 28 tablet; Refill: 0 -     Urine Culture     Follow up plan: Return if symptoms worsen or fail to improve.  Mort Sawyers, FNP

## 2022-10-23 NOTE — Progress Notes (Signed)
Pending susc report

## 2022-10-24 LAB — URINE CULTURE
MICRO NUMBER:: 14831327
SPECIMEN QUALITY:: ADEQUATE

## 2022-10-31 ENCOUNTER — Other Ambulatory Visit: Payer: Self-pay | Admitting: Family

## 2022-10-31 DIAGNOSIS — G479 Sleep disorder, unspecified: Secondary | ICD-10-CM

## 2022-12-30 ENCOUNTER — Other Ambulatory Visit: Payer: Self-pay | Admitting: Family

## 2022-12-30 DIAGNOSIS — I10 Essential (primary) hypertension: Secondary | ICD-10-CM

## 2023-01-29 ENCOUNTER — Emergency Department (HOSPITAL_COMMUNITY): Payer: No Typology Code available for payment source

## 2023-01-29 ENCOUNTER — Encounter: Payer: Self-pay | Admitting: Internal Medicine

## 2023-01-29 ENCOUNTER — Ambulatory Visit: Payer: No Typology Code available for payment source | Admitting: Internal Medicine

## 2023-01-29 ENCOUNTER — Encounter (HOSPITAL_COMMUNITY): Payer: Self-pay | Admitting: Emergency Medicine

## 2023-01-29 ENCOUNTER — Inpatient Hospital Stay (HOSPITAL_COMMUNITY)
Admission: EM | Admit: 2023-01-29 | Discharge: 2023-02-05 | DRG: 286 | Disposition: A | Discharge status: Home / Self Care | Payer: No Typology Code available for payment source | Attending: Internal Medicine | Admitting: Internal Medicine

## 2023-01-29 ENCOUNTER — Other Ambulatory Visit: Payer: Self-pay

## 2023-01-29 VITALS — BP 130/70 | HR 136 | Temp 98.0°F | Ht 69.0 in | Wt 279.0 lb

## 2023-01-29 DIAGNOSIS — Z888 Allergy status to other drugs, medicaments and biological substances status: Secondary | ICD-10-CM

## 2023-01-29 DIAGNOSIS — Z8249 Family history of ischemic heart disease and other diseases of the circulatory system: Secondary | ICD-10-CM | POA: Diagnosis not present

## 2023-01-29 DIAGNOSIS — I2510 Atherosclerotic heart disease of native coronary artery without angina pectoris: Secondary | ICD-10-CM | POA: Diagnosis present

## 2023-01-29 DIAGNOSIS — Z7982 Long term (current) use of aspirin: Secondary | ICD-10-CM

## 2023-01-29 DIAGNOSIS — Z823 Family history of stroke: Secondary | ICD-10-CM | POA: Diagnosis not present

## 2023-01-29 DIAGNOSIS — Z79899 Other long term (current) drug therapy: Secondary | ICD-10-CM

## 2023-01-29 DIAGNOSIS — F419 Anxiety disorder, unspecified: Secondary | ICD-10-CM | POA: Diagnosis not present

## 2023-01-29 DIAGNOSIS — I4892 Unspecified atrial flutter: Secondary | ICD-10-CM | POA: Diagnosis not present

## 2023-01-29 DIAGNOSIS — Z8261 Family history of arthritis: Secondary | ICD-10-CM | POA: Diagnosis not present

## 2023-01-29 DIAGNOSIS — I5033 Acute on chronic diastolic (congestive) heart failure: Secondary | ICD-10-CM | POA: Diagnosis present

## 2023-01-29 DIAGNOSIS — R7989 Other specified abnormal findings of blood chemistry: Secondary | ICD-10-CM | POA: Diagnosis not present

## 2023-01-29 DIAGNOSIS — I272 Pulmonary hypertension, unspecified: Secondary | ICD-10-CM | POA: Diagnosis present

## 2023-01-29 DIAGNOSIS — J449 Chronic obstructive pulmonary disease, unspecified: Secondary | ICD-10-CM | POA: Diagnosis present

## 2023-01-29 DIAGNOSIS — E782 Mixed hyperlipidemia: Secondary | ICD-10-CM | POA: Diagnosis present

## 2023-01-29 DIAGNOSIS — I428 Other cardiomyopathies: Secondary | ICD-10-CM | POA: Diagnosis not present

## 2023-01-29 DIAGNOSIS — I5031 Acute diastolic (congestive) heart failure: Secondary | ICD-10-CM

## 2023-01-29 DIAGNOSIS — E7800 Pure hypercholesterolemia, unspecified: Secondary | ICD-10-CM | POA: Diagnosis not present

## 2023-01-29 DIAGNOSIS — F1011 Alcohol abuse, in remission: Secondary | ICD-10-CM | POA: Diagnosis present

## 2023-01-29 DIAGNOSIS — I110 Hypertensive heart disease with heart failure: Secondary | ICD-10-CM | POA: Diagnosis present

## 2023-01-29 DIAGNOSIS — Z6841 Body Mass Index (BMI) 40.0 and over, adult: Secondary | ICD-10-CM

## 2023-01-29 DIAGNOSIS — R079 Chest pain, unspecified: Secondary | ICD-10-CM | POA: Diagnosis not present

## 2023-01-29 DIAGNOSIS — I340 Nonrheumatic mitral (valve) insufficiency: Secondary | ICD-10-CM | POA: Diagnosis not present

## 2023-01-29 DIAGNOSIS — Z833 Family history of diabetes mellitus: Secondary | ICD-10-CM | POA: Diagnosis not present

## 2023-01-29 DIAGNOSIS — E039 Hypothyroidism, unspecified: Secondary | ICD-10-CM | POA: Diagnosis present

## 2023-01-29 DIAGNOSIS — R000 Tachycardia, unspecified: Secondary | ICD-10-CM | POA: Diagnosis not present

## 2023-01-29 DIAGNOSIS — I10 Essential (primary) hypertension: Secondary | ICD-10-CM

## 2023-01-29 DIAGNOSIS — E538 Deficiency of other specified B group vitamins: Secondary | ICD-10-CM

## 2023-01-29 DIAGNOSIS — F32A Depression, unspecified: Secondary | ICD-10-CM | POA: Diagnosis present

## 2023-01-29 DIAGNOSIS — I4891 Unspecified atrial fibrillation: Secondary | ICD-10-CM | POA: Diagnosis present

## 2023-01-29 DIAGNOSIS — Z79890 Hormone replacement therapy (postmenopausal): Secondary | ICD-10-CM | POA: Diagnosis not present

## 2023-01-29 DIAGNOSIS — I483 Typical atrial flutter: Secondary | ICD-10-CM | POA: Diagnosis not present

## 2023-01-29 DIAGNOSIS — I509 Heart failure, unspecified: Secondary | ICD-10-CM | POA: Diagnosis not present

## 2023-01-29 DIAGNOSIS — F17210 Nicotine dependence, cigarettes, uncomplicated: Secondary | ICD-10-CM | POA: Diagnosis present

## 2023-01-29 LAB — CBC
HCT: 51.4 % (ref 39.0–52.0)
Hemoglobin: 16 g/dL (ref 13.0–17.0)
MCH: 28.3 pg (ref 26.0–34.0)
MCHC: 31.1 g/dL (ref 30.0–36.0)
MCV: 91 fL (ref 80.0–100.0)
Platelets: 219 10*3/uL (ref 150–400)
RBC: 5.65 MIL/uL (ref 4.22–5.81)
RDW: 14.3 % (ref 11.5–15.5)
WBC: 11.1 10*3/uL — ABNORMAL HIGH (ref 4.0–10.5)
nRBC: 0 % (ref 0.0–0.2)

## 2023-01-29 LAB — BASIC METABOLIC PANEL
Anion gap: 11 (ref 5–15)
BUN: 17 mg/dL (ref 6–20)
CO2: 32 mmol/L (ref 22–32)
Calcium: 9.2 mg/dL (ref 8.9–10.3)
Chloride: 99 mmol/L (ref 98–111)
Creatinine, Ser: 1.13 mg/dL (ref 0.61–1.24)
GFR, Estimated: >60 mL/min (ref >60)
Glucose, Bld: 93 mg/dL (ref 70–99)
Potassium: 4.1 mmol/L (ref 3.5–5.1)
Sodium: 142 mmol/L (ref 135–145)

## 2023-01-29 LAB — MAGNESIUM: Magnesium: 2 mg/dL (ref 1.7–2.4)

## 2023-01-29 LAB — TSH: TSH: 4.222 u[IU]/mL (ref 0.350–4.500)

## 2023-01-29 LAB — TROPONIN I (HIGH SENSITIVITY)
Troponin I (High Sensitivity): 20 ng/L — ABNORMAL HIGH (ref <18)
Troponin I (High Sensitivity): 19 ng/L — ABNORMAL HIGH (ref <18)

## 2023-01-29 LAB — BRAIN NATRIURETIC PEPTIDE: B Natriuretic Peptide: 123.1 pg/mL — ABNORMAL HIGH (ref 0.0–100.0)

## 2023-01-29 LAB — D-DIMER, QUANTITATIVE: D-Dimer, Quant: 1.17 ug/mL-FEU — ABNORMAL HIGH (ref 0.00–0.50)

## 2023-01-29 MED ORDER — ATORVASTATIN CALCIUM 40 MG PO TABS
40.0000 mg | ORAL_TABLET | Freq: Every day | ORAL | Status: DC
  Administered 2023-01-29 – 2023-02-05 (×8): 40 mg via ORAL
  Filled 2023-01-29 (×8): qty 1

## 2023-01-29 MED ORDER — LOSARTAN POTASSIUM 50 MG PO TABS
100.0000 mg | ORAL_TABLET | Freq: Every day | ORAL | Status: DC
  Administered 2023-01-29: 100 mg via ORAL
  Filled 2023-01-29: qty 2

## 2023-01-29 MED ORDER — IOHEXOL 350 MG/ML SOLN
75.0000 mL | Freq: Once | INTRAVENOUS | Status: AC | PRN
  Administered 2023-01-29: 75 mL via INTRAVENOUS

## 2023-01-29 MED ORDER — ACETAMINOPHEN 325 MG PO TABS
650.0000 mg | ORAL_TABLET | ORAL | Status: DC | PRN
  Administered 2023-01-31 – 2023-02-05 (×4): 650 mg via ORAL
  Filled 2023-01-29 (×5): qty 2

## 2023-01-29 MED ORDER — DILTIAZEM HCL-DEXTROSE 125-5 MG/125ML-% IV SOLN (PREMIX)
5.0000 mg/h | INTRAVENOUS | Status: DC
  Administered 2023-01-29: 5 mg/h via INTRAVENOUS
  Administered 2023-01-30: 7.5 mg/h via INTRAVENOUS
  Filled 2023-01-29 (×2): qty 125

## 2023-01-29 MED ORDER — UMECLIDINIUM BROMIDE 62.5 MCG/ACT IN AEPB
1.0000 | INHALATION_SPRAY | Freq: Every day | RESPIRATORY_TRACT | Status: DC
  Administered 2023-01-30 – 2023-02-05 (×6): 1 via RESPIRATORY_TRACT
  Filled 2023-01-29 (×2): qty 7

## 2023-01-29 MED ORDER — ASPIRIN 81 MG PO TBEC
81.0000 mg | DELAYED_RELEASE_TABLET | Freq: Every day | ORAL | Status: DC
  Administered 2023-01-29 – 2023-02-01 (×4): 81 mg via ORAL
  Filled 2023-01-29 (×4): qty 1

## 2023-01-29 MED ORDER — LEVOTHYROXINE SODIUM 25 MCG PO TABS: 50.0000 ug | ORAL_TABLET | Freq: Every day | ORAL | Status: DC

## 2023-01-29 MED ORDER — DILTIAZEM LOAD VIA INFUSION
20.0000 mg | Freq: Once | INTRAVENOUS | Status: AC
  Administered 2023-01-29: 20 mg via INTRAVENOUS
  Filled 2023-01-29: qty 20

## 2023-01-29 MED ORDER — ALBUTEROL SULFATE HFA 108 (90 BASE) MCG/ACT IN AERS: 2.0000 | INHALATION_SPRAY | RESPIRATORY_TRACT | Status: DC

## 2023-01-29 MED ORDER — FUROSEMIDE 10 MG/ML IJ SOLN
40.0000 mg | Freq: Two times a day (BID) | INTRAMUSCULAR | Status: DC
  Administered 2023-01-30 – 2023-01-31 (×3): 40 mg via INTRAVENOUS
  Filled 2023-01-29 (×3): qty 4

## 2023-01-29 MED ORDER — ONDANSETRON HCL 4 MG/2ML IJ SOLN
4.0000 mg | Freq: Four times a day (QID) | INTRAMUSCULAR | Status: DC | PRN
  Filled 2023-01-29: qty 2

## 2023-01-29 MED ORDER — LEVOTHYROXINE SODIUM 50 MCG PO TABS
50.0000 ug | ORAL_TABLET | Freq: Every day | ORAL | Status: DC
  Administered 2023-01-30 – 2023-02-05 (×7): 50 ug via ORAL
  Filled 2023-01-29 (×4): qty 1
  Filled 2023-01-29: qty 2
  Filled 2023-01-29 (×2): qty 1

## 2023-01-29 MED ORDER — NICOTINE 21 MG/24HR TD PT24
21.0000 mg | MEDICATED_PATCH | Freq: Every day | TRANSDERMAL | Status: DC
  Administered 2023-01-29 – 2023-02-05 (×8): 21 mg via TRANSDERMAL
  Filled 2023-01-29 (×8): qty 1

## 2023-01-29 MED ORDER — TRAZODONE HCL 50 MG PO TABS
50.0000 mg | ORAL_TABLET | Freq: Every evening | ORAL | Status: DC | PRN
  Filled 2023-01-29: qty 1

## 2023-01-29 MED ORDER — FUROSEMIDE 10 MG/ML IJ SOLN
40.0000 mg | Freq: Once | INTRAMUSCULAR | Status: AC
  Administered 2023-01-29: 40 mg via INTRAVENOUS
  Filled 2023-01-29: qty 4

## 2023-01-29 MED ORDER — FOLIC ACID 1 MG PO TABS
1.0000 mg | ORAL_TABLET | Freq: Every day | ORAL | Status: DC
  Administered 2023-01-29 – 2023-02-05 (×8): 1 mg via ORAL
  Filled 2023-01-29 (×8): qty 1

## 2023-01-29 MED ORDER — TIOTROPIUM BROMIDE MONOHYDRATE 18 MCG IN CAPS: 18.0000 ug | ORAL_CAPSULE | RESPIRATORY_TRACT | Status: DC

## 2023-01-29 MED ORDER — APIXABAN 5 MG PO TABS
5.0000 mg | ORAL_TABLET | Freq: Two times a day (BID) | ORAL | Status: DC
  Administered 2023-01-29 – 2023-01-30 (×2): 5 mg via ORAL
  Filled 2023-01-29 (×2): qty 1

## 2023-01-29 NOTE — Progress Notes (Signed)
Subjective:    Patient ID: Ryan Hatfield, male    DOB: [birth year], 57 y.o.   MRN: [id number]  HPI Here due to swelling and some breathing issues  "I fell apart" Having a lot of swelling--bad in his legs Trouble walking due to this Goes back a while--but getting worse Pain in legs Some trouble breathing--but the legs are the worst part Mostly with exertion---also notes orthopnea Sleeps on couch--often awakens (sometimes with SOB) Chronically has some chest pain--no difference (occ with exertion) No palpitations  Hasn't worked this week--mostly due to weather (but leg pain was bad)---construction and Arboriculturist  Keeps his legs up Compression socks Not much help Some arm swelling--this is better Feels swelling in abdomen also  Is taking the furosemide---does have to pee after it Doesn't take it when going to work though Has tried 40mg  recently--works better (swelling better mostly because he has been off his feet)  Current Outpatient Medications on File Prior to Visit  Medication Sig Dispense Refill   albuterol (VENTOLIN HFA) 108 (90 Base) MCG/ACT inhaler TAKE 2 PUFFS BY MOUTH EVERY 6 HOURS AS NEEDED FOR WHEEZE OR SHORTNESS OF BREATH 8.5 each 1   atorvastatin (LIPITOR) 40 MG tablet Take 1 tablet (40 mg total) by mouth daily. 30 tablet 5   furosemide (LASIX) 20 MG tablet TAKE 1 TABLET (20 MG TOTAL) BY MOUTH DAILY. MUST SCHEDULE PHYSICAL 90 tablet 0   levothyroxine (SYNTHROID) 50 MCG tablet Take 1 tablet (50 mcg total) by mouth daily. 90 tablet 3   losartan (COZAAR) 100 MG tablet Take 1 tablet (100 mg total) by mouth daily. 90 tablet 3   Multiple Vitamin (MULTIVITAMIN WITH MINERALS) TABS tablet Take 1 tablet by mouth daily.     Omega-3 Fatty Acids (FISH OIL) 1000 MG CAPS Take 1,000 mg by mouth daily.     tiotropium (SPIRIVA HANDIHALER) 18 MCG inhalation capsule INHALE 1 CAPSULE VIA HANDIHALER ONCE DAILY AT THE SAME TIME EVERY DAY 30 capsule 5   traZODone (DESYREL) 50  MG tablet TAKE 0.5-1 TABLETS BY MOUTH AT BEDTIME AS NEEDED FOR SLEEP. 90 tablet 2   aspirin EC 81 MG tablet Take 1 tablet (81 mg total) by mouth daily. Swallow whole. (Patient not taking: Reported on 01/29/2023) 90 tablet 3   cholecalciferol (VITAMIN D3) 25 MCG (1000 UNIT) tablet Take 1,000 Units by mouth daily. (Patient not taking: Reported on 01/29/2023)     folic acid (FOLVITE) 1 MG tablet TAKE 1 TABLET BY MOUTH EVERY DAY (Patient not taking: Reported on 01/29/2023) 90 tablet 1   No current facility-administered medications on file prior to visit.    Allergies  Allergen Reactions   Lisinopril Cough   Paroxetine     REACTION: paranoia, confusion   Sertraline Hcl     REACTION: paranoia, confusion    Past Medical History:  Diagnosis Date   Acute appendicitis    Alcohol abuse, in remission 05/29/2015   Anxiety and depression 05/29/2015   Arthritis, degenerative 02/03/2013   Overview:  Of right middle finger MCP joint    Cannot sleep 02/03/2013   Chest pain 01/15/2013   Hypertension    Plantar fasciitis of left foot 01/17/2016    Past Surgical History:  Procedure Laterality Date   LAPAROSCOPIC APPENDECTOMY N/A 11/25/2017   Procedure: APPENDECTOMY LAPAROSCOPIC;  Surgeon: Lattie Haw, MD;  Location: [facility] ORS;  Service: General;  Laterality: N/A;   SEPTOPLASTY     TONSILLECTOMY      Family History  Problem  Relation Age of Onset   Heart disease Mother    Arthritis Mother    Heart disease Father    Diabetes Brother    Stroke Brother    Cancer Maternal Grandmother    Prostate cancer Paternal Grandfather    Kidney cancer Paternal Uncle     Social History   Socioeconomic History   Marital status: Divorced    Spouse name: Not on file   Number of children: Not on file   Years of education: Not on file   Highest education level: Not on file  Occupational History   Occupation: Merchandiser, retail    Comment: [employer]  Tobacco Use   Smoking status: Every Day    Current  packs/day: 1.00    Average packs/day: 1 pack/day for 40.0 years (40.0 ttl pk-yrs)    Types: Cigarettes   Smokeless tobacco: Never  Vaping Use   Vaping status: Former  Substance and Sexual Activity   Alcohol use: Yes    Comment: occassional   Drug use: No   Sexual activity: Yes    Partners: Female  Other Topics Concern   Not on file  Social History Narrative   Not on file   Social Determinants of Health   Financial Resource Strain: Not on file  Food Insecurity: Not on file  Transportation Needs: Not on file  Physical Activity: Not on file  Stress: Not on file  Social Connections: Not on file  Intimate Partner Violence: Not on file   Review of Systems Abnormal sensation in feet--mild burning sensation  Rarely uses additional salt    Objective:   Physical Exam Constitutional:      Appearance: Normal appearance.  Cardiovascular:     Rate and Rhythm: Regular rhythm. Tachycardia present.     Heart sounds: No murmur heard.    No gallop.  Pulmonary:     Comments: Muffled breath sounds at the bases with some left basilar crackles Musculoskeletal:     Comments: 1-2+ edema into calves  Neurological:     Mental Status: He is alert.            Assessment & Plan:

## 2023-01-29 NOTE — ED Triage Notes (Signed)
Pt sent over from PCP for evaluation of SOB, tachycardia and irregular HR. Pt states he has had chest pain on and off for weeks, Has a hard time breathing just taking a few steps. Pt was told by PCP he has CHF and needs to come to er.  Pt sats 87% on RA and HR 156 during triage. Pt placed on 2lnc and immediately states he feels better.

## 2023-01-29 NOTE — Assessment & Plan Note (Signed)
Likely related to rate Echo last year showed preserved EF

## 2023-01-29 NOTE — H&P (Signed)
History and Physical    Patient: Ryan Hatfield ZOX:[id number] DOB: 05/28/1966 DOA: 01/29/2023 DOS: the patient was seen and examined on 01/29/2023 PCP: Mort Sawyers, FNP  Patient coming from: Home  Chief Complaint:  Chief Complaint  Patient presents with   Palpitations   HPI: Ryan Hatfield is a 57 y.o. male with medical history significant of tobacco abuse, COPD, alcohol abuse in remission, morbid obesity, anxiety with depression, diastolic dysfunction CHF who presented to the ER with significant shortness of breath cough and palpitations.  Symptoms started about 3 days ago.  They have persisted and patient has noted increased leg swelling.  Also reported some PND and orthopnea.  On arrival in the ER patient was noted to have tachycardia with irregular rhythm.  His heart rate was later found to be 156 and a flutter.  His oxygen saturation was 87%.  He was told by his PCP that he has CHF.  Last echocardiogram was in August last year showing EF of 60 to 65%.  He has been placed on 2 L here and he is doing better.  Patient is now being admitted with rapid a flutter.  Currently on Cardizem drip.  Also acute exacerbation of CHF based on chest x-ray and findings of increased fluid volume.  Review of Systems: As mentioned in the history of present illness. All other systems reviewed and are negative. Past Medical History:  Diagnosis Date   Acute appendicitis    Alcohol abuse, in remission 05/29/2015   Anxiety and depression 05/29/2015   Arthritis, degenerative 02/03/2013   Overview:  Of right middle finger MCP joint    Cannot sleep 02/03/2013   Chest pain 01/15/2013   Hypertension    Plantar fasciitis of left foot 01/17/2016   Past Surgical History:  Procedure Laterality Date   LAPAROSCOPIC APPENDECTOMY N/A 11/25/2017   Procedure: APPENDECTOMY LAPAROSCOPIC;  Surgeon: Lattie Haw, MD;  Location: [facility] ORS;  Service: General;  Laterality: N/A;   SEPTOPLASTY     TONSILLECTOMY      Social History:  reports that he has been smoking cigarettes. He has a 40 pack-year smoking history. He has never used smokeless tobacco. He reports current alcohol use. He reports that he does not use drugs.  Allergies  Allergen Reactions   Lisinopril Cough   Paroxetine     REACTION: paranoia, confusion   Sertraline Hcl     REACTION: paranoia, confusion    Family History  Problem Relation Age of Onset   Heart disease Mother    Arthritis Mother    Heart disease Father    Diabetes Brother    Stroke Brother    Cancer Maternal Grandmother    Prostate cancer Paternal Grandfather    Kidney cancer Paternal Uncle     Prior to Admission medications   Medication Sig Start Date End Date Taking? Authorizing Provider  albuterol (VENTOLIN HFA) 108 (90 Base) MCG/ACT inhaler TAKE 2 PUFFS BY MOUTH EVERY 6 HOURS AS NEEDED FOR WHEEZE OR SHORTNESS OF BREATH Patient taking differently: Inhale 2 puffs into the lungs See admin instructions. TAKE 2 PUFFS BY MOUTH EVERY 6 HOURS AS NEEDED FOR WHEEZE OR SHORTNESS OF BREATH 09/26/22  Yes Dugal, Tabitha, FNP  atorvastatin (LIPITOR) 40 MG tablet Take 1 tablet (40 mg total) by mouth daily. 09/26/22  Yes Dugal, Wyatt Mage, FNP  furosemide (LASIX) 20 MG tablet TAKE 1 TABLET (20 MG TOTAL) BY MOUTH DAILY. MUST SCHEDULE PHYSICAL Patient taking differently: Take 20 mg by mouth daily. 12/30/22  Yes Mort Sawyers, FNP  levothyroxine (SYNTHROID) 50 MCG tablet Take 1 tablet (50 mcg total) by mouth daily. 09/26/22  Yes Dugal, Wyatt Mage, FNP  losartan (COZAAR) 100 MG tablet Take 1 tablet (100 mg total) by mouth daily. 09/26/22  Yes Dugal, Tabitha, FNP  tiotropium (SPIRIVA HANDIHALER) 18 MCG inhalation capsule INHALE 1 CAPSULE VIA HANDIHALER ONCE DAILY AT THE SAME TIME EVERY DAY Patient taking differently: Place 18 mcg into inhaler and inhale See admin instructions. INHALE 1 CAPSULE VIA HANDIHALER ONCE DAILY AT THE SAME TIME EVERY DAY 09/26/22  Yes Mort Sawyers, FNP  aspirin EC  81 MG tablet Take 1 tablet (81 mg total) by mouth daily. Swallow whole. Patient not taking: Reported on 01/29/2023 02/17/22   Debbe Odea, MD  folic acid (FOLVITE) 1 MG tablet TAKE 1 TABLET BY MOUTH EVERY DAY Patient not taking: Reported on 01/29/2023 05/23/22   Mort Sawyers, FNP  traZODone (DESYREL) 50 MG tablet TAKE 0.5-1 TABLETS BY MOUTH AT BEDTIME AS NEEDED FOR SLEEP. Patient not taking: Reported on 01/29/2023 10/31/22   Mort Sawyers, FNP    Physical Exam: Vitals:   01/29/23 1854 01/29/23 1900 01/29/23 1930 01/29/23 2015  BP:   105/68 (!) 140/74  Pulse:  (!) 104 (!) 104 98  Resp:  17 18 19   Temp: 98.1 F (36.7 C)     TempSrc:      SpO2:  95% 97% 96%  Weight:      Height:       Constitutional: Morbidly obese, in mild respiratory distress NAD, calm, comfortable Eyes: PERRL, lids and conjunctivae normal ENMT: Mucous membranes are moist. Posterior pharynx clear of any exudate or lesions.Normal dentition.  Neck: normal, supple, no masses, no thyromegaly Respiratory: Coarse breath sounds bilaterally with crackles, no wheezing. Normal respiratory effort. No accessory muscle use.  Cardiovascular: Irregularly irregular with tachycardia, no murmurs / rubs / gallops. No extremity edema. 2+ pedal pulses. No carotid bruits.  Abdomen: no tenderness, no masses palpated. No hepatosplenomegaly. Bowel sounds positive.  Musculoskeletal: Good range of motion, no joint swelling or tenderness, Skin: no rashes, lesions, ulcers. No induration Neurologic: CN 2-12 grossly intact. Sensation intact, DTR normal. Strength 5/5 in all 4.  Psychiatric: Normal judgment and insight. Alert and oriented x 3. Normal mood  Data Reviewed:  WBC 11.1, BNP 123, D-Dimer 1.17, chest x-ray showed diffuse pulmonary edema, CT angiogram of the chest showed no PE but small right pleural effusion with atelectasis of the right lower lobe.  Assessment and Plan:  #1 atrial flutter with rapid ventricular response: Patient  has no prior history of a flutter or A-fib that we can see.  Patient will be admitted currently on Cardizem drip.  Will continue titration.  Patient will also require anticoagulation.  He is high risk.  Continue with echocardiogram and cardiology consult in AM.  #2 acute on chronic diastolic heart failure: Will diurese.  Probably triggered by the A-fib.  Follow echocardiogram.  Last echo a year ago was showing EF of 60 to 65%.  #3 COPD: No evidence of acute exacerbation.  Continue chronic breathing treatments.  He is on nebulizer at home.  #4 tobacco abuse: Will add nicotine patch.  #5 history of alcohol abuse: Patient currently in remission.  #6 morbid obesity: Dietary counseling.    Advance Care Planning:   Code Status: Prior full code  Consults: None but consult cardiology in the morning  Family Communication: Son at bedside  Severity of Illness: The appropriate patient status for this patient is  INPATIENT. Inpatient status is judged to be reasonable and necessary in order to provide the required intensity of service to ensure the patient's safety. The patient's presenting symptoms, physical exam findings, and initial radiographic and laboratory data in the context of their chronic comorbidities is felt to place them at high risk for further clinical deterioration. Furthermore, it is not anticipated that the patient will be medically stable for discharge from the hospital within 2 midnights of admission.   * I certify that at the point of admission it is my clinical judgment that the patient will require inpatient hospital care spanning beyond 2 midnights from the point of admission due to high intensity of service, high risk for further deterioration and high frequency of surveillance required.*  AuthorLonia Blood, MD 01/29/2023 8:36 PM  For on call review [email or web address].

## 2023-01-29 NOTE — ED Provider Notes (Signed)
EMERGENCY DEPARTMENT AT [facility] Provider Note  CSN: [id number] Arrival date & time: 01/29/23 1346  Chief Complaint(s) Palpitations  HPI Ryan Hatfield is a 57 y.o. male history of diastolic CHF, COPD, hypertension, hyperlipidemia presenting to the emergency department with shortness of breath.  Patient reports that he has progressive worsening shortness of breath over months.  Probably worse over the past week.  Reports today he finally decided to see his doctor where they found that his heart rate was in the 150s and he appeared to have leg swelling and low oxygen.  He sent him to the emergency department for further evaluation.  Patient denies any chest pain currently, has had intermittent chest pain.  Reports bilateral lower extremity swelling which has been worsening.  Reports compliance with his home Lasix.  No fevers or chills.  Reports dry cough.  No headaches, back pain, other new symptoms.   Past Medical History Past Medical History:  Diagnosis Date   Acute appendicitis    Alcohol abuse, in remission 05/29/2015   Anxiety and depression 05/29/2015   Arthritis, degenerative 02/03/2013   Overview:  Of right middle finger MCP joint    Cannot sleep 02/03/2013   Chest pain 01/15/2013   Hypertension    Plantar fasciitis of left foot 01/17/2016   Patient Active Problem List   Diagnosis Date Noted   Tachycardia 01/29/2023   Acute diastolic heart failure (HCC) 01/29/2023   Atrial flutter (HCC) 01/29/2023   Atrial flutter with rapid ventricular response (HCC) 01/29/2023   Urinary retention 10/21/2022   Acute cystitis with hematuria 10/21/2022   Diastasis of rectus abdominis 09/26/2022   Leukocytosis 09/05/2021   Vitamin B12 deficiency 08/07/2021   Vitamin D deficiency 08/07/2021   Chronic gout of right foot 08/07/2021   COPD (chronic obstructive pulmonary disease) (HCC) 10/17/2019   Acquired hypothyroidism 10/17/2019   Mixed hyperlipidemia 10/17/2019    Essential hypertension 09/13/2019   Benign neoplasm of brain (HCC) 10/26/2017   Carpal tunnel syndrome 01/17/2016   Anxiety and depression 05/29/2015   Alcohol abuse, in remission 05/29/2015   Arthritis, degenerative 02/03/2013   Home Medication(s) Prior to Admission medications   Medication Sig Start Date End Date Taking? Authorizing Provider  albuterol (VENTOLIN HFA) 108 (90 Base) MCG/ACT inhaler TAKE 2 PUFFS BY MOUTH EVERY 6 HOURS AS NEEDED FOR WHEEZE OR SHORTNESS OF BREATH Patient taking differently: Inhale 2 puffs into the lungs See admin instructions. TAKE 2 PUFFS BY MOUTH EVERY 6 HOURS AS NEEDED FOR WHEEZE OR SHORTNESS OF BREATH 09/26/22  Yes Dugal, Tabitha, FNP  atorvastatin (LIPITOR) 40 MG tablet Take 1 tablet (40 mg total) by mouth daily. 09/26/22  Yes Dugal, Wyatt Mage, FNP  furosemide (LASIX) 20 MG tablet TAKE 1 TABLET (20 MG TOTAL) BY MOUTH DAILY. MUST SCHEDULE PHYSICAL Patient taking differently: Take 20 mg by mouth daily. 12/30/22  Yes Dugal, Wyatt Mage, FNP  levothyroxine (SYNTHROID) 50 MCG tablet Take 1 tablet (50 mcg total) by mouth daily. 09/26/22  Yes Dugal, Wyatt Mage, FNP  losartan (COZAAR) 100 MG tablet Take 1 tablet (100 mg total) by mouth daily. 09/26/22  Yes Dugal, Tabitha, FNP  tiotropium (SPIRIVA HANDIHALER) 18 MCG inhalation capsule INHALE 1 CAPSULE VIA HANDIHALER ONCE DAILY AT THE SAME TIME EVERY DAY Patient taking differently: Place 18 mcg into inhaler and inhale See admin instructions. INHALE 1 CAPSULE VIA HANDIHALER ONCE DAILY AT THE SAME TIME EVERY DAY 09/26/22  Yes Mort Sawyers, FNP  aspirin EC 81 MG tablet Take 1 tablet (81  mg total) by mouth daily. Swallow whole. Patient not taking: Reported on 01/29/2023 02/17/22   Debbe Odea, MD  folic acid (FOLVITE) 1 MG tablet TAKE 1 TABLET BY MOUTH EVERY DAY Patient not taking: Reported on 01/29/2023 05/23/22   Mort Sawyers, FNP  traZODone (DESYREL) 50 MG tablet TAKE 0.5-1 TABLETS BY MOUTH AT BEDTIME AS NEEDED FOR  SLEEP. Patient not taking: Reported on 01/29/2023 10/31/22   Mort Sawyers, FNP                                                                                                                                    Past Surgical History Past Surgical History:  Procedure Laterality Date   LAPAROSCOPIC APPENDECTOMY N/A 11/25/2017   Procedure: APPENDECTOMY LAPAROSCOPIC;  Surgeon: Lattie Haw, MD;  Location: [facility] ORS;  Service: General;  Laterality: N/A;   SEPTOPLASTY     TONSILLECTOMY     Family History Family History  Problem Relation Age of Onset   Heart disease Mother    Arthritis Mother    Heart disease Father    Diabetes Brother    Stroke Brother    Cancer Maternal Grandmother    Prostate cancer Paternal Grandfather    Kidney cancer Paternal Uncle     Social History Social History   Tobacco Use   Smoking status: Every Day    Current packs/day: 1.00    Average packs/day: 1 pack/day for 40.0 years (40.0 ttl pk-yrs)    Types: Cigarettes   Smokeless tobacco: Never  Vaping Use   Vaping status: Former  Substance Use Topics   Alcohol use: Yes    Comment: occassional   Drug use: No   Allergies Lisinopril, Paroxetine, and Sertraline hcl  Review of Systems Review of Systems  All other systems reviewed and are negative.   Physical Exam Vital Signs  I have reviewed the triage vital signs BP (!) 140/74   Pulse 98   Temp 98.1 F (36.7 C)   Resp 19   Ht 5\' 9"  (1.753 m)   Wt 126.5 kg   SpO2 96%   BMI 41.18 kg/m  Physical Exam Vitals and nursing note reviewed.  Constitutional:      General: He is not in acute distress.    Appearance: Normal appearance.  HENT:     Mouth/Throat:     Mouth: Mucous membranes are moist.  Eyes:     Conjunctiva/sclera: Conjunctivae normal.  Cardiovascular:     Rate and Rhythm: Tachycardia present. Rhythm irregular.  Pulmonary:     Comments: Mild increased work of breathing, distant breath sounds Abdominal:     General: Abdomen is  flat.     Palpations: Abdomen is soft.     Tenderness: There is no abdominal tenderness.  Musculoskeletal:     Right lower leg: Edema present.     Left lower leg: Edema present.  Skin:    General: Skin is warm and  dry.     Capillary Refill: Capillary refill takes less than 2 seconds.  Neurological:     Mental Status: He is alert and oriented to person, place, and time. Mental status is at baseline.  Psychiatric:        Mood and Affect: Mood normal.        Behavior: Behavior normal.     ED Results and Treatments Labs (all labs ordered are listed, but only abnormal results are displayed) Labs Reviewed  CBC - Abnormal; Notable for the following components:      Result Value   WBC 11.1 (*)    All other components within normal limits  BRAIN NATRIURETIC PEPTIDE - Abnormal; Notable for the following components:   B Natriuretic Peptide 123.1 (*)    All other components within normal limits  D-DIMER, QUANTITATIVE - Abnormal; Notable for the following components:   D-Dimer, Quant 1.17 (*)    All other components within normal limits  TROPONIN I (HIGH SENSITIVITY) - Abnormal; Notable for the following components:   Troponin I (High Sensitivity) 20 (*)    All other components within normal limits  TROPONIN I (HIGH SENSITIVITY) - Abnormal; Notable for the following components:   Troponin I (High Sensitivity) 19 (*)    All other components within normal limits  BASIC METABOLIC PANEL  MAGNESIUM                                                                                                                          Radiology CT Angio Chest Pulmonary Embolism (PE) W or WO Contrast  Result Date: 01/29/2023 CLINICAL DATA:  Positive D-dimer. EXAM: CT ANGIOGRAPHY CHEST WITH CONTRAST TECHNIQUE: Multidetector CT imaging of the chest was performed using the standard protocol during bolus administration of intravenous contrast. Multiplanar CT image reconstructions and MIPs were obtained to  evaluate the vascular anatomy. RADIATION DOSE REDUCTION: This exam was performed according to the departmental dose-optimization program which includes automated exposure control, adjustment of the mA and/or kV according to patient size and/or use of iterative reconstruction technique. CONTRAST:  75mL OMNIPAQUE IOHEXOL 350 MG/ML SOLN COMPARISON:  Radiograph of same day. FINDINGS: Cardiovascular: Satisfactory opacification of the pulmonary arteries to the segmental level. No evidence of pulmonary embolism. Normal heart size. No pericardial effusion. Mediastinum/Nodes: No enlarged mediastinal, hilar, or axillary lymph nodes. Thyroid gland, trachea, and esophagus demonstrate no significant findings. Lungs/Pleura: No pneumothorax is noted. Small right pleural effusion is noted with adjacent atelectasis of the right lower lobe. Left lung is unremarkable. Upper Abdomen: No acute abnormality. Musculoskeletal: No chest wall abnormality. No acute or significant osseous findings. Review of the MIP images confirms the above findings. IMPRESSION: No definite evidence of pulmonary embolus. Small right pleural effusion is noted with adjacent atelectasis of right lower lobe. Aortic Atherosclerosis (ICD10-I70.0). Electronically Signed   By: Lupita Raider M.D.   On: 01/29/2023 18:02   DG Chest Port 1 View  Result Date: 01/29/2023 CLINICAL DATA:  5107 Atrial  fibrillation (HCC) 5107 EXAM: PORTABLE CHEST 1 VIEW COMPARISON:  01/14/2022. FINDINGS: Mild pulmonary vascular congestion, which may be accentuated by low lung volume. Probable linear area of atelectasis overlying the right mid lung zone. There is homogeneous opacity obscuring the right lateral costophrenic angle, which may represent small right pleural effusion with probable associated compressive atelectasis. Left lateral costophrenic angle is clear. Bilateral lungs otherwise clear. Stable cardio-mediastinal silhouette. No acute osseous abnormalities. The soft tissues are  within normal limits. IMPRESSION: *Mild pulmonary vascular congestion. Probable small right pleural effusion with associated compressive atelectasis. Electronically Signed   By: Jules Schick M.D.   On: 01/29/2023 16:00    Pertinent labs & imaging results that were available during my care of the patient were reviewed by me and considered in my medical decision making (see MDM for details).  Medications Ordered in ED Medications  diltiazem (CARDIZEM) 1 mg/mL load via infusion 20 mg (20 mg Intravenous Bolus from Bag 01/29/23 1626)    And  diltiazem (CARDIZEM) 125 mg in dextrose 5% 125 mL (1 mg/mL) infusion (7.5 mg/hr Intravenous Rate/Dose Change 01/29/23 1700)  furosemide (LASIX) injection 40 mg (40 mg Intravenous Given 01/29/23 1623)  iohexol (OMNIPAQUE) 350 MG/ML injection 75 mL (75 mLs Intravenous Contrast Given 01/29/23 1744)                                                                                                                                     Procedures .Critical Care  Performed by: Lonell Grandchild, MD Authorized by: Lonell Grandchild, MD   Critical care provider statement:    Critical care time (minutes):  30   Critical care was time spent personally by me on the following activities:  Development of treatment plan with patient or surrogate, discussions with consultants, evaluation of patient's response to treatment, examination of patient, ordering and review of laboratory studies, ordering and review of radiographic studies, ordering and performing treatments and interventions, pulse oximetry, re-evaluation of patient's condition and review of old charts   Care discussed with: admitting provider     (including critical care time)  Medical Decision Making / ED Course   MDM:  57 year old male presenting to the emergency department shortness of breath.  Patient well-appearing, does appear to have increased work of breathing.  Has distant breath sounds.  Physical  exam also notable for bilateral lower extremity edema.  EKG shows atrial flutter.  Patient not on anticoagulation.  Unclear when this began so not candidate for cardioversion.  Will place on diltiazem drip and infusion.  Patient also appears volume overloaded which could be due to his underlying CHF or worsening in the setting of his atrial flutter so we will give Lasix IV.  Labs obtained including BNP which is mildly elevated, patient is obese so could be falsely low.  Differential also includes pulmonary embolism, obtain D-dimer which is elevated, so we will proceed with CT PE.  Patient has had intermittent chest pain, denies chest pain currently, will check troponin.  Will check electrolytes including magnesium.  Patient will need to be admitted given his atrial flutter with rapid ventricular response as well as signs of acute CHF exacerbation. Clinical Course as of 01/29/23 2044  Thu Jan 29, 2023  2042 D-dimer was slightly elevated however no evidence of pulmonary embolism on CT scan.  Given hypoxia, new onset a flutter discussed with the hospitalist to admit the patient for further management. [WS]    Clinical Course User Index [WS] Suezanne Jacquet, Jerilee Field, MD     Additional history obtained: -Additional history obtained from family -External records from outside source obtained and reviewed including: Chart review including previous notes, labs, imaging, consultation notes including PMD notes   Lab Tests: -I ordered, reviewed, and interpreted labs.   The pertinent results include:   Labs Reviewed  CBC - Abnormal; Notable for the following components:      Result Value   WBC 11.1 (*)    All other components within normal limits  BRAIN NATRIURETIC PEPTIDE - Abnormal; Notable for the following components:   B Natriuretic Peptide 123.1 (*)    All other components within normal limits  D-DIMER, QUANTITATIVE - Abnormal; Notable for the following components:   D-Dimer, Quant 1.17 (*)    All  other components within normal limits  TROPONIN I (HIGH SENSITIVITY) - Abnormal; Notable for the following components:   Troponin I (High Sensitivity) 20 (*)    All other components within normal limits  TROPONIN I (HIGH SENSITIVITY) - Abnormal; Notable for the following components:   Troponin I (High Sensitivity) 19 (*)    All other components within normal limits  BASIC METABOLIC PANEL  MAGNESIUM    Notable for elevated d-dimer, mild elevated BNP, stable mild troponin elevation  EKG   EKG Interpretation Date/Time:  Thursday January 29 2023 13:58:49 EDT Ventricular Rate:  123 PR Interval:    QRS Duration:  94 QT Interval:  350 QTC Calculation: 501 R Axis:   104  Text Interpretation: Atrial flutter with variable A-V block Rightward axis Nonspecific ST abnormality Abnormal ECG When compared with ECG of 14-Jan-2022 02:00, atrial flutter is new Confirmed by Alvino Blood ([id number]) on 01/29/2023 3:05:57 PM         Imaging Studies ordered: I ordered imaging studies including CTA chest On my interpretation imaging demonstrates no PE I independently visualized and interpreted imaging. I agree with the radiologist interpretation   Medicines ordered and prescription drug management: Meds ordered this encounter  Medications   furosemide (LASIX) injection 40 mg   AND Linked Order Group    diltiazem (CARDIZEM) 1 mg/mL load via infusion 20 mg    diltiazem (CARDIZEM) 125 mg in dextrose 5% 125 mL (1 mg/mL) infusion   iohexol (OMNIPAQUE) 350 MG/ML injection 75 mL    -I have reviewed the patients home medicines and have made adjustments as needed   Consultations Obtained: I requested consultation with the hospitalist,  and discussed lab and imaging findings as well as pertinent plan - they recommend: admission   Cardiac Monitoring: The patient was maintained on a cardiac monitor.  I personally viewed and interpreted the cardiac monitored which showed an underlying rhythm of: atrial  flutter with RVR  Social Determinants of Health:  Diagnosis or treatment significantly limited by social determinants of health: obesity   Reevaluation: After the interventions noted above, I reevaluated the patient and found that their symptoms have improved  Co  morbidities that complicate the patient evaluation  Past Medical History:  Diagnosis Date   Acute appendicitis    Alcohol abuse, in remission 05/29/2015   Anxiety and depression 05/29/2015   Arthritis, degenerative 02/03/2013   Overview:  Of right middle finger MCP joint    Cannot sleep 02/03/2013   Chest pain 01/15/2013   Hypertension    Plantar fasciitis of left foot 01/17/2016      Dispostion: Disposition decision including need for hospitalization was considered, and patient admitted to the hospital.    Final Clinical Impression(s) / ED Diagnoses Final diagnoses:  Atrial flutter with rapid ventricular response (HCC)  Acute diastolic congestive heart failure (HCC)     This chart was dictated using voice recognition software.  Despite best efforts to proofread,  errors can occur which can change the documentation meaning.    Lonell Grandchild, MD 01/29/23 2044

## 2023-01-29 NOTE — Assessment & Plan Note (Signed)
EKG shows atrial flutter at 127 This is new Will have him proceed to [place]---will likely need cardioversion if medications not effective now (due to the CHF) Will ask him to have family take him

## 2023-01-29 NOTE — Assessment & Plan Note (Signed)
Sounds regular---at least 140's Likely SVT Will check EKG

## 2023-01-29 NOTE — ED Notes (Signed)
Patient transported to CT 

## 2023-01-30 ENCOUNTER — Inpatient Hospital Stay (HOSPITAL_COMMUNITY): Payer: No Typology Code available for payment source

## 2023-01-30 ENCOUNTER — Encounter (HOSPITAL_COMMUNITY): Payer: Self-pay | Admitting: Internal Medicine

## 2023-01-30 DIAGNOSIS — I4892 Unspecified atrial flutter: Secondary | ICD-10-CM | POA: Diagnosis not present

## 2023-01-30 DIAGNOSIS — I10 Essential (primary) hypertension: Secondary | ICD-10-CM | POA: Diagnosis not present

## 2023-01-30 DIAGNOSIS — I5031 Acute diastolic (congestive) heart failure: Secondary | ICD-10-CM

## 2023-01-30 DIAGNOSIS — I509 Heart failure, unspecified: Secondary | ICD-10-CM

## 2023-01-30 DIAGNOSIS — E7800 Pure hypercholesterolemia, unspecified: Secondary | ICD-10-CM

## 2023-01-30 DIAGNOSIS — F1011 Alcohol abuse, in remission: Secondary | ICD-10-CM

## 2023-01-30 DIAGNOSIS — E782 Mixed hyperlipidemia: Secondary | ICD-10-CM

## 2023-01-30 DIAGNOSIS — E039 Hypothyroidism, unspecified: Secondary | ICD-10-CM | POA: Diagnosis not present

## 2023-01-30 DIAGNOSIS — I483 Typical atrial flutter: Secondary | ICD-10-CM

## 2023-01-30 DIAGNOSIS — J449 Chronic obstructive pulmonary disease, unspecified: Secondary | ICD-10-CM

## 2023-01-30 DIAGNOSIS — F32A Depression, unspecified: Secondary | ICD-10-CM

## 2023-01-30 DIAGNOSIS — R079 Chest pain, unspecified: Secondary | ICD-10-CM

## 2023-01-30 DIAGNOSIS — R7989 Other specified abnormal findings of blood chemistry: Secondary | ICD-10-CM

## 2023-01-30 DIAGNOSIS — F419 Anxiety disorder, unspecified: Secondary | ICD-10-CM

## 2023-01-30 LAB — LIPID PANEL
Cholesterol: 126 mg/dL (ref 0–200)
HDL: 28 mg/dL — ABNORMAL LOW (ref >40)
LDL Cholesterol: 79 mg/dL (ref 0–99)
Total CHOL/HDL Ratio: 4.5 RATIO
Triglycerides: 96 mg/dL (ref <150)
VLDL: 19 mg/dL (ref 0–40)

## 2023-01-30 MED ORDER — METOPROLOL TARTRATE 25 MG PO TABS
25.0000 mg | ORAL_TABLET | Freq: Two times a day (BID) | ORAL | Status: DC
  Administered 2023-01-30: 25 mg via ORAL
  Filled 2023-01-30: qty 1

## 2023-01-30 MED ORDER — METOPROLOL TARTRATE 12.5 MG HALF TABLET
12.5000 mg | ORAL_TABLET | Freq: Two times a day (BID) | ORAL | Status: DC
  Administered 2023-01-30: 12.5 mg via ORAL
  Filled 2023-01-30: qty 1

## 2023-01-30 MED ORDER — HEPARIN (PORCINE) 25000 UT/250ML-% IV SOLN
1850.0000 [IU]/h | INTRAVENOUS | Status: DC
  Administered 2023-01-31: 1700 [IU]/h via INTRAVENOUS
  Administered 2023-01-31: 1400 [IU]/h via INTRAVENOUS
  Administered 2023-02-01 (×2): 1850 [IU]/h via INTRAVENOUS
  Filled 2023-01-30 (×4): qty 250

## 2023-01-30 MED ORDER — LEVALBUTEROL HCL 0.63 MG/3ML IN NEBU: 0.6300 mg | INHALATION_SOLUTION | Freq: Four times a day (QID) | RESPIRATORY_TRACT | Status: DC | PRN

## 2023-01-30 MED ORDER — LOSARTAN POTASSIUM 50 MG PO TABS: 50.0000 mg | ORAL_TABLET | Freq: Every day | ORAL | Status: DC

## 2023-01-30 NOTE — Discharge Instructions (Signed)
Information on my medicine - ELIQUIS® (apixaban) ° °Why was Eliquis® prescribed for you? °Eliquis® was prescribed for you to reduce the risk of forming blood clots that can cause a stroke if you have a medical condition called atrial fibrillation (a type of irregular heartbeat) OR to reduce the risk of a blood clots forming after orthopedic surgery. ° °What do You need to know about Eliquis® ? °Take your Eliquis® TWICE DAILY - one tablet in the morning and one tablet in the evening with or without food.  It would be best to take the doses about the same time each day. ° °If you have difficulty swallowing the tablet whole please discuss with your pharmacist how to take the medication safely. ° °Take Eliquis® exactly as prescribed by your doctor and DO NOT stop taking Eliquis® without talking to the doctor who prescribed the medication.  Stopping may increase your risk of developing a new clot or stroke.  Refill your prescription before you run out. ° °After discharge, you should have regular check-up appointments with your healthcare provider that is prescribing your Eliquis®.  In the future your dose may need to be changed if your kidney function or weight changes by a significant amount or as you get older. ° °What do you do if you miss a dose? °If you miss a dose, take it as soon as you remember on the same day and resume taking twice daily.  Do not take more than one dose of ELIQUIS at the same time. ° °Important Safety Information °A possible side effect of Eliquis® is bleeding. You should call your healthcare provider right away if you experience any of the following: °Bleeding from an injury or your nose that does not stop. °Unusual colored urine (red or dark brown) or unusual colored stools (red or black). °Unusual bruising for unknown reasons. °A serious fall or if you hit your head (even if there is no bleeding). ° °Some medicines may interact with Eliquis® and might increase your risk of bleeding or  clotting while on Eliquis®. To help avoid this, consult your healthcare provider or pharmacist prior to using any new prescription or non-prescription medications, including herbals, vitamins, non-steroidal anti-inflammatory drugs (NSAIDs) and supplements. ° °This website has more information on Eliquis® (apixaban): http://www.eliquis.com/eliquis/home °  °

## 2023-01-30 NOTE — Progress Notes (Signed)
PROGRESS NOTE    Ryan Hatfield  ZOX:[id number] DOB: 03-02-1966 DOA: 01/29/2023 PCP: Mort Sawyers, FNP    Brief Narrative:   Ryan Hatfield is a 57 y.o. male with medical history significant of tobacco abuse, COPD, alcohol abuse in remission, morbid obesity, anxiety with depression, diastolic dysfunction CHF presented to the ER with significant shortness of breath, cough and palpitations for 3 days with some leg swelling.  Patient also had PND and orthopnea.  In the ED, patient was tachycardic with irregular rhythm and was noted to have atrial flutter with rapid ventricular response.  Pulse ox was 87% on room air.  Patient was then admitted hospital and was started on Cardizem drip.    Assessment and Plan:   Atrial flutter with rapid ventricular response:  New diagnosis of atrial flutter.  On Cardizem drip.  Heart rate better controlled at this time.  Check 2D echocardiogram.  Consult cardiology.    TSH of 4.2.  On Eliquis.    acute on chronic diastolic heart failure:  Likely triggered by atrial fibrillation.  Check 2D echocardiogram.  Continue losartan.  2D echo cardiogram from 1 year back showed EF of 60 to 65%.  Received diuretics.  BNP slightly elevated at 123.  Patient does have peripheral edema.  Will continue with IV Lasix 40 twice daily.  Strict intake and output charting, daily weights.   COPD: Continue nebulizers.   tobacco abuse: Cessation advised.  Continue  nicotine patch.   history of alcohol abuse: Patient currently in remission.  History of hypertension.  On losartan.  Will continue.  Currently on Cardizem drip as well.    morbid obesity: Body mass index is 41.18 kg/m.  Would benefit from weight loss as outpatient.      DVT prophylaxis:  apixaban (ELIQUIS) tablet 5 mg   Code Status:     Code Status: Full Code  Disposition: Home likely in 1 to 2 days Status is: Inpatient  Remains inpatient appropriate because: A-fib with RVR, congestive heart failure,  Cardizem drip, cardiology evaluation   Family Communication: None  Consultants:  Cardiology  Procedures:  None  Antimicrobials:  None  Anti-infectives (From admission, onward)    None       Subjective: Today, patient was seen and examined at bedside. Shortness of breath little better has dyspnea on exertion.  No chest pain dizziness lightheadedness.  Objective: Vitals:   01/30/23 0841 01/30/23 0900 01/30/23 0930 01/30/23 1000  BP:  123/61 123/70 122/63  Pulse: 90 (!) 51 77 86  Resp: 20 18  18   Temp: 98.6 F (37 C)     TempSrc: Oral     SpO2: 95% 96% 97% 96%  Weight:      Height:       No intake or output data in the 24 hours ending 01/30/23 1047 Filed Weights   01/29/23 1420 01/29/23 1737  Weight: 126.6 kg 126.5 kg    Physical Examination: Body mass index is 41.18 kg/m.  General: Obese built, not in obvious distress, on nasal cannula oxygen HENT:   No scleral pallor or icterus noted. Oral mucosa is moist.  Chest:   Diminished breath sounds bilaterally.  Coarse breath sounds noted CVS: S1 &S2 heard. No murmur.  Regular rhythm Abdomen: Soft, nontender, nondistended.  Bowel sounds are heard.   Extremities: No cyanosis, clubbing with bilateral lower extremity edema++ peripheral pulses are palpable. Psych: Alert, awake and oriented, normal mood CNS:  No cranial nerve deficits.  Power equal in all  extremities.   Skin: Warm and dry.  No rashes noted.  Data Reviewed:   CBC: Recent Labs  Lab 01/29/23 1426 01/30/23 0048  WBC 11.1* 10.8*  HGB 16.0 16.5  HCT 51.4 50.7  MCV 91.0 93.4  PLT 219 221    Basic Metabolic Panel: Recent Labs  Lab 01/29/23 1426 01/29/23 1615 01/30/23 0048  NA 142  --  143  K 4.1  --  4.1  CL 99  --  93*  CO2 32  --  35*  GLUCOSE 93  --  109*  BUN 17  --  17  CREATININE 1.13  --  1.07  CALCIUM 9.2  --  9.2  MG  --  2.0  --     Liver Function Tests: No results for input(s): "AST", "ALT", "ALKPHOS", "BILITOT", "PROT",  "ALBUMIN" in the last 168 hours.   Radiology Studies: CT Angio Chest Pulmonary Embolism (PE) W or WO Contrast  Result Date: 01/29/2023 CLINICAL DATA:  Positive D-dimer. EXAM: CT ANGIOGRAPHY CHEST WITH CONTRAST TECHNIQUE: Multidetector CT imaging of the chest was performed using the standard protocol during bolus administration of intravenous contrast. Multiplanar CT image reconstructions and MIPs were obtained to evaluate the vascular anatomy. RADIATION DOSE REDUCTION: This exam was performed according to the departmental dose-optimization program which includes automated exposure control, adjustment of the mA and/or kV according to patient size and/or use of iterative reconstruction technique. CONTRAST:  75mL OMNIPAQUE IOHEXOL 350 MG/ML SOLN COMPARISON:  Radiograph of same day. FINDINGS: Cardiovascular: Satisfactory opacification of the pulmonary arteries to the segmental level. No evidence of pulmonary embolism. Normal heart size. No pericardial effusion. Mediastinum/Nodes: No enlarged mediastinal, hilar, or axillary lymph nodes. Thyroid gland, trachea, and esophagus demonstrate no significant findings. Lungs/Pleura: No pneumothorax is noted. Small right pleural effusion is noted with adjacent atelectasis of the right lower lobe. Left lung is unremarkable. Upper Abdomen: No acute abnormality. Musculoskeletal: No chest wall abnormality. No acute or significant osseous findings. Review of the MIP images confirms the above findings. IMPRESSION: No definite evidence of pulmonary embolus. Small right pleural effusion is noted with adjacent atelectasis of right lower lobe. Aortic Atherosclerosis (ICD10-I70.0). Electronically Signed   By: Lupita Raider M.D.   On: 01/29/2023 18:02   DG Chest Port 1 View  Result Date: 01/29/2023 CLINICAL DATA:  5107 Atrial fibrillation (HCC) 5107 EXAM: PORTABLE CHEST 1 VIEW COMPARISON:  01/14/2022. FINDINGS: Mild pulmonary vascular congestion, which may be accentuated by low  lung volume. Probable linear area of atelectasis overlying the right mid lung zone. There is homogeneous opacity obscuring the right lateral costophrenic angle, which may represent small right pleural effusion with probable associated compressive atelectasis. Left lateral costophrenic angle is clear. Bilateral lungs otherwise clear. Stable cardio-mediastinal silhouette. No acute osseous abnormalities. The soft tissues are within normal limits. IMPRESSION: *Mild pulmonary vascular congestion. Probable small right pleural effusion with associated compressive atelectasis. Electronically Signed   By: Jules Schick M.D.   On: 01/29/2023 16:00      LOS: 1 day     Joycelyn Das, MD [facility] Available via Epic secure chat 7am-7pm After these hours, please refer to coverage provider listed on [email or web address] 01/30/2023, 10:47 AM

## 2023-01-30 NOTE — ED Notes (Signed)
Pt resting no complaints at this time, cardizem is at 7.5mg /hr.

## 2023-01-30 NOTE — Plan of Care (Signed)

## 2023-01-30 NOTE — ED Notes (Signed)
ED TO INPATIENT HANDOFF REPORT  ED Nurse Name and Phone #: cori [phone]  S Name/Age/Gender Ryan Hatfield 57 y.o. male Room/Bed: 003C/003C  Code Status   Code Status: Full Code  Home/SNF/Other Home Patient oriented to: self, place, time, and situation Is this baseline? Yes   Triage Complete: Triage complete  Chief Complaint Atrial flutter with rapid ventricular response (HCC) [I48.92]  Triage Note Pt sent over from PCP for evaluation of SOB, tachycardia and irregular HR. Pt states he has had chest pain on and off for weeks, Has a hard time breathing just taking a few steps. Pt was told by PCP he has CHF and needs to come to er.  Pt sats 87% on RA and HR 156 during triage. Pt placed on 2lnc and immediately states he feels better.    Allergies Allergies  Allergen Reactions   Lisinopril Cough   Paroxetine     REACTION: paranoia, confusion   Sertraline Hcl     REACTION: paranoia, confusion    Level of Care/Admitting Diagnosis ED Disposition     ED Disposition  Admit   Condition  --   Comment  Hospital Area: [facility] [[id number]]  Level of Care: Progressive [102]  Admit to Progressive based on following criteria: CARDIOVASCULAR & THORACIC of moderate stability with acute coronary syndrome symptoms/low risk myocardial infarction/hypertensive urgency/arrhythmias/heart failure potentially compromising stability and stable post cardiovascular intervention patients.  May admit patient to [facility] or [facility] if equivalent level of care is available:: No  Covid Evaluation: Asymptomatic - no recent exposure (last 10 days) testing not required  Diagnosis: Atrial flutter with rapid ventricular response [facility]) [[id number]]  Admitting Physician: Rometta Emery [[id number]]  Attending Physician: Rometta Emery [[id number]]  Certification:: I certify this patient will need inpatient services for at least 2 midnights  Estimated Length of Stay: 3           B Medical/Surgery History Past Medical History:  Diagnosis Date   Acute appendicitis    Alcohol abuse, in remission 05/29/2015   Anxiety and depression 05/29/2015   Arthritis, degenerative 02/03/2013   Overview:  Of right middle finger MCP joint    Cannot sleep 02/03/2013   Chest pain 01/15/2013   Hypertension    Plantar fasciitis of left foot 01/17/2016   Past Surgical History:  Procedure Laterality Date   LAPAROSCOPIC APPENDECTOMY N/A 11/25/2017   Procedure: APPENDECTOMY LAPAROSCOPIC;  Surgeon: Lattie Haw, MD;  Location: [facility] ORS;  Service: General;  Laterality: N/A;   SEPTOPLASTY     TONSILLECTOMY       A IV Location/Drains/Wounds Patient Lines/Drains/Airways Status     Active Line/Drains/Airways     Name Placement date Placement time Site Days   Peripheral IV 01/29/23 20 G Anterior;Left;Proximal Forearm 01/29/23  1623  Forearm  1   Peripheral IV 01/29/23 20 G Posterior;Right Hand 01/29/23  1644  Hand  1            Intake/Output Last 24 hours No intake or output data in the 24 hours ending 01/30/23 1145  Labs/Imaging Results for orders placed or performed during the hospital encounter of 01/29/23 (from the past 48 hour(s))  Brain natriuretic peptide     Status: Abnormal   Collection Time: 01/29/23  2:22 PM  Result Value Ref Range   B Natriuretic Peptide 123.1 (H) 0.0 - 100.0 pg/mL    Comment: Performed at [facility], [address], [place], [place] [address]  Basic  metabolic panel     Status: None   Collection Time: 01/29/23  2:26 PM  Result Value Ref Range   Sodium 142 135 - 145 mmol/L   Potassium 4.1 3.5 - 5.1 mmol/L   Chloride 99 98 - 111 mmol/L   CO2 32 22 - 32 mmol/L   Glucose, Bld 93 70 - 99 mg/dL    Comment: Glucose reference range applies only to samples taken after fasting for at least 8 hours.   BUN 17 6 - 20 mg/dL   Creatinine, Ser 0.86 0.61 - 1.24 mg/dL   Calcium 9.2 8.9 - 57.8 mg/dL   GFR, Estimated >46 >96 mL/min     Comment: (NOTE) Calculated using the CKD-EPI Creatinine Equation (2021)    Anion gap 11 5 - 15    Comment: Performed at [facility], [address] [place], [place] [address]  CBC     Status: Abnormal   Collection Time: 01/29/23  2:26 PM  Result Value Ref Range   WBC 11.1 (H) 4.0 - 10.5 K/uL   RBC 5.65 4.22 - 5.81 MIL/uL   Hemoglobin 16.0 13.0 - 17.0 g/dL   HCT 41.3 24.4 - 01.0 %   MCV 91.0 80.0 - 100.0 fL   MCH 28.3 26.0 - 34.0 pg   MCHC 31.1 30.0 - 36.0 g/dL   RDW 27.2 53.6 - 64.4 %   Platelets 219 150 - 400 K/uL   nRBC 0.0 0.0 - 0.2 %    Comment: Performed at [facility], [address] [place], [place] [address]  Magnesium     Status: None   Collection Time: 01/29/23  4:15 PM  Result Value Ref Range   Magnesium 2.0 1.7 - 2.4 mg/dL    Comment: Performed at [facility], [address] [place], [place] [address]  D-dimer, quantitative     Status: Abnormal   Collection Time: 01/29/23  4:15 PM  Result Value Ref Range   D-Dimer, Quant 1.17 (H) 0.00 - 0.50 ug/mL-FEU    Comment: (NOTE) At the manufacturer cut-off value of 0.5 g/mL FEU, this assay has a negative predictive value of 95-100%.This assay is intended for use in conjunction with a clinical pretest probability (PTP) assessment model to exclude pulmonary embolism (PE) and deep venous thrombosis (DVT) in outpatients suspected of PE or DVT. Results should be correlated with clinical presentation. Performed at [facility], [address], [place], [place] [address]   Troponin I (High Sensitivity)     Status: Abnormal   Collection Time: 01/29/23  4:15 PM  Result Value Ref Range   Troponin I (High Sensitivity) 20 (H) <18 ng/L    Comment: (NOTE) Elevated high sensitivity troponin I (hsTnI) values and significant  changes across serial measurements may suggest ACS but many other  chronic and acute conditions are known to elevate hsTnI results.  Refer to the "Links" section for chest pain  algorithms and additional  guidance. Performed at [facility], [address] [place], [place] [address]   Troponin I (High Sensitivity)     Status: Abnormal   Collection Time: 01/29/23  7:29 PM  Result Value Ref Range   Troponin I (High Sensitivity) 19 (H) <18 ng/L    Comment: (NOTE) Elevated high sensitivity troponin I (hsTnI) values and significant  changes across serial measurements may suggest ACS but many other  chronic and acute conditions are known to elevate hsTnI results.  Refer to the "Links" section  for chest pain algorithms and additional  guidance. Performed at [facility], [address] [place], [place] [address]   TSH     Status: None   Collection Time: 01/29/23  7:29 PM  Result Value Ref Range   TSH 4.222 0.350 - 4.500 uIU/mL    Comment: Performed by a 3rd Generation assay with a functional sensitivity of <=0.01 uIU/mL. Performed at [facility], [address] [place], [place] [address]   HIV Antibody (routine testing w rflx)     Status: None   Collection Time: 01/30/23 12:48 AM  Result Value Ref Range   HIV Screen 4th Generation wRfx Non Reactive Non Reactive    Comment: Performed at [facility], [address], [place], [place] [address]  Basic metabolic panel     Status: Abnormal   Collection Time: 01/30/23 12:48 AM  Result Value Ref Range   Sodium 143 135 - 145 mmol/L   Potassium 4.1 3.5 - 5.1 mmol/L   Chloride 93 (L) 98 - 111 mmol/L   CO2 35 (H) 22 - 32 mmol/L   Glucose, Bld 109 (H) 70 - 99 mg/dL    Comment: Glucose reference range applies only to samples taken after fasting for at least 8 hours.   BUN 17 6 - 20 mg/dL   Creatinine, Ser 9.14 0.61 - 1.24 mg/dL   Calcium 9.2 8.9 - 78.2 mg/dL   GFR, Estimated >95 >62 mL/min    Comment: (NOTE) Calculated using the CKD-EPI Creatinine Equation (2021)    Anion gap 15 5 - 15    Comment: Performed at [facility], [address], [place], [place] [address]  CBC      Status: Abnormal   Collection Time: 01/30/23 12:48 AM  Result Value Ref Range   WBC 10.8 (H) 4.0 - 10.5 K/uL   RBC 5.43 4.22 - 5.81 MIL/uL   Hemoglobin 16.5 13.0 - 17.0 g/dL   HCT 57.8 46.9 - 62.9 %   MCV 93.4 80.0 - 100.0 fL   MCH 30.4 26.0 - 34.0 pg   MCHC 32.5 30.0 - 36.0 g/dL   RDW 52.8 41.3 - 24.4 %   Platelets 221 150 - 400 K/uL   nRBC 0.0 0.0 - 0.2 %    Comment: Performed at [facility], [address], [place], [place] [address]  Lipid panel     Status: Abnormal   Collection Time: 01/30/23 12:48 AM  Result Value Ref Range   Cholesterol 126 0 - 200 mg/dL   Triglycerides 96 <253 mg/dL   HDL 28 (L) >66 mg/dL   Total CHOL/HDL Ratio 4.5 RATIO   VLDL 19 0 - 40 mg/dL   LDL Cholesterol 79 0 - 99 mg/dL    Comment:        Total Cholesterol/HDL:CHD Risk Coronary Heart Disease Risk Table                     Men   Women  1/2 Average Risk   3.4   3.3  Average Risk       5.0   4.4  2 X Average Risk   9.6   7.1  3 X Average Risk  23.4   11.0        Use the calculated Patient Ratio above and the CHD Risk Table to determine the patient's CHD Risk.        ATP III CLASSIFICATION (LDL):  <100  mg/dL   Optimal  403-474  mg/dL   Near or Above                    Optimal  130-159  mg/dL   Borderline  259-563  mg/dL   High  >875     mg/dL   Very High Performed at [facility], [address], [place], [place] [address]    CT Angio Chest Pulmonary Embolism (PE) W or WO Contrast  Result Date: 01/29/2023 CLINICAL DATA:  Positive D-dimer. EXAM: CT ANGIOGRAPHY CHEST WITH CONTRAST TECHNIQUE: Multidetector CT imaging of the chest was performed using the standard protocol during bolus administration of intravenous contrast. Multiplanar CT image reconstructions and MIPs were obtained to evaluate the vascular anatomy. RADIATION DOSE REDUCTION: This exam was performed according to the departmental dose-optimization program which includes automated exposure control, adjustment of  the mA and/or kV according to patient size and/or use of iterative reconstruction technique. CONTRAST:  75mL OMNIPAQUE IOHEXOL 350 MG/ML SOLN COMPARISON:  Radiograph of same day. FINDINGS: Cardiovascular: Satisfactory opacification of the pulmonary arteries to the segmental level. No evidence of pulmonary embolism. Normal heart size. No pericardial effusion. Mediastinum/Nodes: No enlarged mediastinal, hilar, or axillary lymph nodes. Thyroid gland, trachea, and esophagus demonstrate no significant findings. Lungs/Pleura: No pneumothorax is noted. Small right pleural effusion is noted with adjacent atelectasis of the right lower lobe. Left lung is unremarkable. Upper Abdomen: No acute abnormality. Musculoskeletal: No chest wall abnormality. No acute or significant osseous findings. Review of the MIP images confirms the above findings. IMPRESSION: No definite evidence of pulmonary embolus. Small right pleural effusion is noted with adjacent atelectasis of right lower lobe. Aortic Atherosclerosis (ICD10-I70.0). Electronically Signed   By: Lupita Raider M.D.   On: 01/29/2023 18:02   DG Chest Port 1 View  Result Date: 01/29/2023 CLINICAL DATA:  5107 Atrial fibrillation (HCC) 5107 EXAM: PORTABLE CHEST 1 VIEW COMPARISON:  01/14/2022. FINDINGS: Mild pulmonary vascular congestion, which may be accentuated by low lung volume. Probable linear area of atelectasis overlying the right mid lung zone. There is homogeneous opacity obscuring the right lateral costophrenic angle, which may represent small right pleural effusion with probable associated compressive atelectasis. Left lateral costophrenic angle is clear. Bilateral lungs otherwise clear. Stable cardio-mediastinal silhouette. No acute osseous abnormalities. The soft tissues are within normal limits. IMPRESSION: *Mild pulmonary vascular congestion. Probable small right pleural effusion with associated compressive atelectasis. Electronically Signed   By: Jules Schick  M.D.   On: 01/29/2023 16:00    Pending Labs Unresulted Labs (From admission, onward)     Start     Ordered   01/31/23 0500  Basic metabolic panel  Tomorrow morning,   R        01/30/23 1052   01/31/23 0500  Magnesium  Tomorrow morning,   R        01/30/23 1052   01/31/23 0500  CBC  Tomorrow morning,   R        01/30/23 1052            Vitals/Pain Today's Vitals   01/30/23 1000 01/30/23 1030 01/30/23 1100 01/30/23 1130  BP: 122/63 132/69 (!) 102/58 (!) 99/55  Pulse: 86 88 (!) 102 60  Resp: 18  20   Temp:      TempSrc:      SpO2: 96% 94% 96% 95%  Weight:      Height:      PainSc:  Isolation Precautions No active isolations  Medications Medications  diltiazem (CARDIZEM) 1 mg/mL load via infusion 20 mg (20 mg Intravenous Bolus from Bag 01/29/23 1626)    And  diltiazem (CARDIZEM) 125 mg in dextrose 5% 125 mL (1 mg/mL) infusion (5 mg/hr Intravenous Rate/Dose Change 01/30/23 1140)  aspirin EC tablet 81 mg (81 mg Oral Given 01/29/23 2157)  folic acid (FOLVITE) tablet 1 mg (1 mg Oral Given 01/29/23 2157)  albuterol (VENTOLIN HFA) 108 (90 Base) MCG/ACT inhaler 2 puff (has no administration in time range)  atorvastatin (LIPITOR) tablet 40 mg (40 mg Oral Given 01/29/23 2157)  losartan (COZAAR) tablet 100 mg (100 mg Oral Given 01/29/23 2157)  traZODone (DESYREL) tablet 50 mg (has no administration in time range)  acetaminophen (TYLENOL) tablet 650 mg (has no administration in time range)  ondansetron (ZOFRAN) injection 4 mg (has no administration in time range)  apixaban (ELIQUIS) tablet 5 mg (5 mg Oral Given 01/29/23 2157)  furosemide (LASIX) injection 40 mg (40 mg Intravenous Given 01/30/23 0844)  nicotine (NICODERM CQ - dosed in mg/24 hours) patch 21 mg (21 mg Transdermal Patch Removed 01/30/23 1139)  levothyroxine (SYNTHROID) tablet 50 mcg (50 mcg Oral Given 01/30/23 0601)  umeclidinium bromide (INCRUSE ELLIPTA) 62.5 MCG/ACT 1 puff (1 puff Inhalation Given 01/30/23 0842)   furosemide (LASIX) injection 40 mg (40 mg Intravenous Given 01/29/23 1623)  iohexol (OMNIPAQUE) 350 MG/ML injection 75 mL (75 mLs Intravenous Contrast Given 01/29/23 1744)    Mobility walks with device     Focused Assessments Cardiac Assessment Handoff:    Lab Results  Component Value Date   TROPONINI <0.03 11/20/2014   Lab Results  Component Value Date   DDIMER 1.17 (H) 01/29/2023   Does the Patient currently have chest pain? No    R Recommendations: See Admitting Provider Note  Report given to:   Additional Notes: dilt drip decreased to 5 at 1115

## 2023-01-30 NOTE — Progress Notes (Signed)
ANTICOAGULATION CONSULT NOTE - Initial Consult  Pharmacy Consult for heaprin Indication: atrial fibrillation  Allergies  Allergen Reactions   Lisinopril Cough   Paroxetine     REACTION: paranoia, confusion   Sertraline Hcl     REACTION: paranoia, confusion    Patient Measurements: Height: 5\' 9"  (175.3 cm) Weight: 125.2 kg (276 lb) IBW/kg (Calculated) : 70.7 Heparin Dosing Weight: 99.4 kg  Vital Signs: Temp: 99 F (37.2 C) (07/26 1249) Temp Source: Oral (07/26 1249) BP: 103/72 (07/26 1150) Pulse Rate: 91 (07/26 1153)  Labs: Recent Labs    01/29/23 1426 01/29/23 1615 01/29/23 1929 01/30/23 0048  HGB 16.0  --   --  16.5  HCT 51.4  --   --  50.7  PLT 219  --   --  221  CREATININE 1.13  --   --  1.07  TROPONINIHS  --  20* 19*  --     Estimated Creatinine Clearance: 97.3 mL/min (by C-G formula based on SCr of 1.07 mg/dL).   Medical History: Past Medical History:  Diagnosis Date   Acute appendicitis    Alcohol abuse, in remission 05/29/2015   Anxiety and depression 05/29/2015   Arthritis, degenerative 02/03/2013   Overview:  Of right middle finger MCP joint    Cannot sleep 02/03/2013   Chest pain 01/15/2013   Hypertension    Plantar fasciitis of left foot 01/17/2016    Medications:  Scheduled:   aspirin EC  81 mg Oral Daily   atorvastatin  40 mg Oral Daily   folic acid  1 mg Oral Daily   furosemide  40 mg Intravenous BID   levothyroxine  50 mcg Oral Q0600   [START ON 01/31/2023] losartan  50 mg Oral Daily   metoprolol tartrate  12.5 mg Oral BID   nicotine  21 mg Transdermal Daily   umeclidinium bromide  1 puff Inhalation Daily    Assessment: 57 yom presenting with AFib - no AC PTA. Was started on apixaban 5 mg BID (LD 7/26@1158 ).  Hgb 16.5, plt 221. No s/sx of bleeding.   Given recent DOAC will order both aPTT and heparin level until correlate.   Goal of Therapy:  Heparin level 0.3-0.7 units/ml aPTT 66-102 seconds Monitor platelets by  anticoagulation protocol: Yes   Plan:  Stop apixaban Start heparin infusion at 1400 units/hr on 7/27@0000  (12 hours after last apixaban dose) Order levels 6 hr after start Monitor daily HL, CBC, and for s/sx of bleeding   Thank you for allowing pharmacy to participate in this patient's care,  Sherron Monday, PharmD, BCCCP Clinical Pharmacist  Phone: [phone] 01/30/2023 1:14 PM  Please check AMION for all [facility] phone numbers After 10:00 PM, call Main Pharmacy [phone]

## 2023-01-30 NOTE — Progress Notes (Addendum)
Per Dr. Norris Cross request, have discussed cath/consent with patient and set up for [facility] on Monday @ 7:30am with Dr. Tresa Endo with [facility] as IVF. Will defer to weekend rounding team to address IV Lasix orders in preparation for cath as he is being diuresed for now.  Informed Consent   Shared Decision Making/Informed Consent The risks [stroke (1 in 1000), death (1 in 1000), kidney failure [usually temporary] (1 in 500), bleeding (1 in 200), allergic reaction [possibly serious] (1 in 200)], benefits (diagnostic support and management of coronary artery disease) and alternatives of a cardiac catheterization were discussed in detail with Ryan Hatfield and he is willing to proceed.

## 2023-01-30 NOTE — Care Plan (Signed)
Cardiology Plan of Care note: Called for asymptomatic AFL RVR - diltiazem paused for concerns of cardiomyopathy and with rate control at time of evaluation (LVEF appears grossly normal in follow up echo) - asymptomatic per report - will increase metoprolol dose; BP has been charted today, hold ARB planned AM at this time.  BP presently ~120/80 heart rates 110-130 with variance.  Riley Lam, MD FASE [facility] Cardiologist [facility]  [address] [place], [address] [place], [place] [address] [phone]  8:07 PM

## 2023-01-30 NOTE — Consult Note (Signed)
Cardiology Consultation:   Patient ID: CLEOPHUS WILLS MRN: [id number]; DOB: [birth year]  Admit date: 01/29/2023 Date of Consult: 01/30/2023  Primary Care Provider: Mort Sawyers, FNP [facility] Cardiologist: Debbe Odea, MD  [facility] Electrophysiologist:  None    Patient Profile:   Starlyn Skeans is a 57 y.o. male with a hx of hypertension, hyperlipidemia, ongoing tobacco abuse, alcohol abuse in remission, anxiety and depression  who is being seen today for the evaluation of new onset atrial flutter and acute CHF at the request of Lonia Blood, MD.  History of Present Illness:   Mr. Freidel is a 57 year old male with a history of hypertension, hyperlipidemia, morbid obesity, ongoing tobacco abuse, remote alcohol abuse, anxiety and depression.  He has a history of chest pain initially evaluated by Dr. Azucena Cecil a year ago.  He was supposed to get a coronary CTA but that never occurred.  2D echo a year ago showed EF 60-65% with mild LVH and G1DD.    He has no prior history of atrial fibrillation or CHF.  He tells me that over the past few months he has had problems with lower extremity edema in his legs.  He is on chronic Lasix for this but it seemed to have gotten worse and over the past week took extra doses of Lasix but his legs did not get any better.  He also has noted increased shortness of breath over the past week as well associated with PND and orthopnea..  This is in addition to weight gain.  He said he tried to go on a diet and thinks he is lost some weight.  He has been having recent chest pain that occurs 4 to 5 minutes at a time daily associated with nausea and diaphoresis.  He has not taken anything for the chest pain.  It can come on at rest or with exertion.  He denies any palpitations.  He continues to smokes 1.5 packs/day of cigarettes.  In ER he was tachycardic and found to be in atrial flutter with RVR to 156 bpm.  He was hypoxemic with O2  saturations on room air 87%.  He was started on O2 and placed on IV Cardizem drip.  BNP was mildly elevated at 123 and hsTrop 20>>19.  SCr 1.13, K+ 4.1, Hbg 16.  Cxray showed mild vascular congestion and small pleural effusion.    Past Medical History:  Diagnosis Date   Acute appendicitis    Alcohol abuse, in remission 05/29/2015   Anxiety and depression 05/29/2015   Arthritis, degenerative 02/03/2013   Overview:  Of right middle finger MCP joint    Cannot sleep 02/03/2013   Chest pain 01/15/2013   Hypertension    Plantar fasciitis of left foot 01/17/2016    Past Surgical History:  Procedure Laterality Date   LAPAROSCOPIC APPENDECTOMY N/A 11/25/2017   Procedure: APPENDECTOMY LAPAROSCOPIC;  Surgeon: Lattie Haw, MD;  Location: [facility] ORS;  Service: General;  Laterality: N/A;   SEPTOPLASTY     TONSILLECTOMY       Home Medications:  Prior to Admission medications   Medication Sig Start Date End Date Taking? Authorizing Provider  albuterol (VENTOLIN HFA) 108 (90 Base) MCG/ACT inhaler TAKE 2 PUFFS BY MOUTH EVERY 6 HOURS AS NEEDED FOR WHEEZE OR SHORTNESS OF BREATH Patient taking differently: Inhale 2 puffs into the lungs See admin instructions. TAKE 2 PUFFS BY MOUTH EVERY 6 HOURS AS NEEDED FOR WHEEZE OR SHORTNESS OF BREATH 09/26/22  Yes Mort Sawyers, FNP  atorvastatin (LIPITOR) 40 MG tablet Take 1 tablet (40 mg total) by mouth daily. 09/26/22  Yes Dugal, Wyatt Mage, FNP  furosemide (LASIX) 20 MG tablet TAKE 1 TABLET (20 MG TOTAL) BY MOUTH DAILY. MUST SCHEDULE PHYSICAL Patient taking differently: Take 20 mg by mouth daily. 12/30/22  Yes Dugal, Wyatt Mage, FNP  levothyroxine (SYNTHROID) 50 MCG tablet Take 1 tablet (50 mcg total) by mouth daily. 09/26/22  Yes Dugal, Wyatt Mage, FNP  losartan (COZAAR) 100 MG tablet Take 1 tablet (100 mg total) by mouth daily. 09/26/22  Yes Dugal, Tabitha, FNP  tiotropium (SPIRIVA HANDIHALER) 18 MCG inhalation capsule INHALE 1 CAPSULE VIA HANDIHALER ONCE DAILY AT THE SAME  TIME EVERY DAY Patient taking differently: Place 18 mcg into inhaler and inhale See admin instructions. INHALE 1 CAPSULE VIA HANDIHALER ONCE DAILY AT THE SAME TIME EVERY DAY 09/26/22  Yes Mort Sawyers, FNP  aspirin EC 81 MG tablet Take 1 tablet (81 mg total) by mouth daily. Swallow whole. Patient not taking: Reported on 01/29/2023 02/17/22   Debbe Odea, MD  folic acid (FOLVITE) 1 MG tablet TAKE 1 TABLET BY MOUTH EVERY DAY Patient not taking: Reported on 01/29/2023 05/23/22   Mort Sawyers, FNP  traZODone (DESYREL) 50 MG tablet TAKE 0.5-1 TABLETS BY MOUTH AT BEDTIME AS NEEDED FOR SLEEP. Patient not taking: Reported on 01/29/2023 10/31/22   Mort Sawyers, FNP    Inpatient Medications: Scheduled Meds:  albuterol  2 puff Inhalation See admin instructions   apixaban  5 mg Oral BID   aspirin EC  81 mg Oral Daily   atorvastatin  40 mg Oral Daily   folic acid  1 mg Oral Daily   furosemide  40 mg Intravenous BID   levothyroxine  50 mcg Oral Q0600   losartan  100 mg Oral Daily   nicotine  21 mg Transdermal Daily   umeclidinium bromide  1 puff Inhalation Daily   Continuous Infusions:  diltiazem (CARDIZEM) infusion 5 mg/hr (01/30/23 1140)   PRN Meds: acetaminophen, ondansetron (ZOFRAN) IV, traZODone  Allergies:    Allergies  Allergen Reactions   Lisinopril Cough   Paroxetine     REACTION: paranoia, confusion   Sertraline Hcl     REACTION: paranoia, confusion    Social History:   Social History   Socioeconomic History   Marital status: Divorced    Spouse name: Not on file   Number of children: Not on file   Years of education: Not on file   Highest education level: Not on file  Occupational History   Occupation: Merchandiser, retail    Comment: [employer]  Tobacco Use   Smoking status: Every Day    Current packs/day: 1.00    Average packs/day: 1 pack/day for 40.0 years (40.0 ttl pk-yrs)    Types: Cigarettes   Smokeless tobacco: Never  Vaping Use   Vaping status: Former   Substance and Sexual Activity   Alcohol use: Yes    Comment: occassional   Drug use: No   Sexual activity: Yes    Partners: Female  Other Topics Concern   Not on file  Social History Narrative   Not on file   Social Determinants of Health   Financial Resource Strain: Not on file  Food Insecurity: Not on file  Transportation Needs: Not on file  Physical Activity: Not on file  Stress: Not on file  Social Connections: Not on file  Intimate Partner Violence: Not on file    Family History:    Family History  Problem Relation  Age of Onset   Heart disease Mother    Arthritis Mother    Heart disease Father    Diabetes Brother    Stroke Brother    Cancer Maternal Grandmother    Prostate cancer Paternal Grandfather    Kidney cancer Paternal Uncle      ROS:  Please see the history of present illness.   All other ROS reviewed and negative.     Physical Exam/Data:   Vitals:   01/30/23 1100 01/30/23 1130 01/30/23 1150 01/30/23 1153  BP: (!) 102/58 (!) 99/55 103/72   Pulse: (!) 102 60 91 91  Resp: 20  (!) 25 (!) 21  Temp:      TempSrc:      SpO2: 96% 95% 94% 98%  Weight:      Height:        Intake/Output Summary (Last 24 hours) at 01/30/2023 1240 Last data filed at 01/30/2023 1147 Gross per 24 hour  Intake --  Output 2100 ml  Net -2100 ml      01/29/2023    5:37 PM 01/29/2023    2:20 PM 01/29/2023   11:39 AM  Last 3 Weights  Weight (lbs) 278 lb 14.1 oz 279 lb 279 lb  Weight (kg) 126.5 kg 126.554 kg 126.554 kg     Body mass index is 41.18 kg/m.  General:  Well nourished, well developed, in no acute distress HEENT: normal Lymph: no adenopathy Neck: no JVD Endocrine:  No thryomegaly Vascular: No carotid bruits; FA pulses 2+ bilaterally without bruits  Cardiac:  normal S1, S2; irregular; no murmur  Lungs: Crackles at bases Abd: soft, nontender, no hepatomegaly  Ext: 2+ bilateral lower extremity edema Musculoskeletal:  No deformities, BUE and BLE strength  normal and equal Skin: warm and dry  Neuro:  CNs 2-12 intact, no focal abnormalities noted Psych:  Normal affect   EKG:  The EKG was personally reviewed and demonstrates:  atrial flutter with EVE at 127bpm and possible anteroseptal infarct Telemetry:  Telemetry was personally reviewed and demonstrates:  Currently not hooked up to Tele  Laboratory Data:  High Sensitivity Troponin:   Recent Labs  Lab 01/29/23 1615 01/29/23 1929  TROPONINIHS 20* 19*     Chemistry Recent Labs  Lab 01/29/23 1426 01/30/23 0048  NA 142 143  K 4.1 4.1  CL 99 93*  CO2 32 35*  GLUCOSE 93 109*  BUN 17 17  CREATININE 1.13 1.07  CALCIUM 9.2 9.2  GFRNONAA >60 >60  ANIONGAP 11 15    No results for input(s): "PROT", "ALBUMIN", "AST", "ALT", "ALKPHOS", "BILITOT" in the last 168 hours. Hematology Recent Labs  Lab 01/29/23 1426 01/30/23 0048  WBC 11.1* 10.8*  RBC 5.65 5.43  HGB 16.0 16.5  HCT 51.4 50.7  MCV 91.0 93.4  MCH 28.3 30.4  MCHC 31.1 32.5  RDW 14.3 14.6  PLT 219 221   BNP Recent Labs  Lab 01/29/23 1422  BNP 123.1*    DDimer  Recent Labs  Lab 01/29/23 1615  DDIMER 1.17*     Radiology/Studies:  CT Angio Chest Pulmonary Embolism (PE) W or WO Contrast  Result Date: 01/29/2023 CLINICAL DATA:  Positive D-dimer. EXAM: CT ANGIOGRAPHY CHEST WITH CONTRAST TECHNIQUE: Multidetector CT imaging of the chest was performed using the standard protocol during bolus administration of intravenous contrast. Multiplanar CT image reconstructions and MIPs were obtained to evaluate the vascular anatomy. RADIATION DOSE REDUCTION: This exam was performed according to the departmental dose-optimization program which includes automated exposure  control, adjustment of the mA and/or kV according to patient size and/or use of iterative reconstruction technique. CONTRAST:  75mL OMNIPAQUE IOHEXOL 350 MG/ML SOLN COMPARISON:  Radiograph of same day. FINDINGS: Cardiovascular: Satisfactory opacification of the  pulmonary arteries to the segmental level. No evidence of pulmonary embolism. Normal heart size. No pericardial effusion. Mediastinum/Nodes: No enlarged mediastinal, hilar, or axillary lymph nodes. Thyroid gland, trachea, and esophagus demonstrate no significant findings. Lungs/Pleura: No pneumothorax is noted. Small right pleural effusion is noted with adjacent atelectasis of the right lower lobe. Left lung is unremarkable. Upper Abdomen: No acute abnormality. Musculoskeletal: No chest wall abnormality. No acute or significant osseous findings. Review of the MIP images confirms the above findings. IMPRESSION: No definite evidence of pulmonary embolus. Small right pleural effusion is noted with adjacent atelectasis of right lower lobe. Aortic Atherosclerosis (ICD10-I70.0). Electronically Signed   By: Lupita Raider M.D.   On: 01/29/2023 18:02   DG Chest Port 1 View  Result Date: 01/29/2023 CLINICAL DATA:  5107 Atrial fibrillation (HCC) 5107 EXAM: PORTABLE CHEST 1 VIEW COMPARISON:  01/14/2022. FINDINGS: Mild pulmonary vascular congestion, which may be accentuated by low lung volume. Probable linear area of atelectasis overlying the right mid lung zone. There is homogeneous opacity obscuring the right lateral costophrenic angle, which may represent small right pleural effusion with probable associated compressive atelectasis. Left lateral costophrenic angle is clear. Bilateral lungs otherwise clear. Stable cardio-mediastinal silhouette. No acute osseous abnormalities. The soft tissues are within normal limits. IMPRESSION: *Mild pulmonary vascular congestion. Probable small right pleural effusion with associated compressive atelectasis. Electronically Signed   By: Jules Schick M.D.   On: 01/29/2023 16:00     Assessment and Plan:   New onset atrial flutter with RVR -Unknown duration of atrial flutter but likely a few days to weeks based on his other symptoms -He has been started on Eliquis for CHADS2VASC  score of 2 but would actually hold the Eliquis for now given his frequent anginal symptoms and multiple cardiac risk factors>> I think he needs heart cath -Transition Eliquis to IV heparin drip per pharmacy -Repeat the echo to reassess LV function -Heart rate is now in the 80s so we will stop IV Cardizem drip -Start Lopressor 12.5 mg twice daily -Recommend changing albuterol inhaler to Xopenex due to a flutter  Acute CHF Elevated Toponin Chest pain -CHF Possibly tachy mediated related to atrial flutter with RVR but also has been having episodes of chest pain so need to consider ischemia -He was supposed to have a coronary CTA a year ago and this never occurred -High-sensitivity troponin is minimally elevated with flat trend but I am concerned about his symptoms of chest pain and dyspnea on exertion for several weeks occurring on a daily basis with radiation into his shoulder and left arm associated with nausea and diaphoresis -I think he would benefit from right and left heart catheterization to assess filling pressures and define coronary anatomy -He has gotten 2 doses of apixaban so will not be able to do heart cath today>>will plan for Monday -Will hold Eliquis and start IV heparin per pharmacy -continue ASA 81mg  daily and Atorvastatin 40mg  daily -add low dose Lopressor 12.5mg  BID as BP allows -Await results of 2D echo and if EF down will need to start GDMT as BP allows -Continue Lasix 40 mg IV twice daily as he is still volume overloaded on exam -He has put out over 2.1 L thus far on IV diuretics -Serum creatinine stable at 1.07  and potassium 4.1 today magnesium 2.  TSH normal at 4.2 -Given soft blood pressure and need for further diuresis and heart rate control, we will decrease losartan to 50 mg daily.  If EF is found to be reduced we will transition at discharge to [facility].  HLD -LDL 79 and HDL 28 this morning. -If he is found to have CAD will need to increase atorvastatin to 80 mg  daily to get LDL less than 70 -For now continue atorvastatin 40 mg daily  Tobacco abuse -Ongoing smoking 1.5 packs/day -Smoking cessation encouraged         New York Heart Association (NYHA) Functional Class NYHA Class III  CHA2DS2-VASc Score = 2   This indicates a 2.2% annual risk of stroke. The patient's score is based upon: CHF History: 1 HTN History: 1 Diabetes History: 0 Stroke History: 0 Vascular Disease History: 0 Age Score: 0 Gender Score: 0         For questions or updates, please contact [facility] Please consult [email or web address] for contact info under    Signed, Armanda Magic, MD  01/30/2023 12:40 PM

## 2023-01-31 DIAGNOSIS — F1011 Alcohol abuse, in remission: Secondary | ICD-10-CM | POA: Diagnosis not present

## 2023-01-31 DIAGNOSIS — E039 Hypothyroidism, unspecified: Secondary | ICD-10-CM | POA: Diagnosis not present

## 2023-01-31 DIAGNOSIS — I4892 Unspecified atrial flutter: Secondary | ICD-10-CM | POA: Diagnosis not present

## 2023-01-31 DIAGNOSIS — I5031 Acute diastolic (congestive) heart failure: Secondary | ICD-10-CM | POA: Diagnosis not present

## 2023-01-31 LAB — APTT: aPTT: 63 seconds — ABNORMAL HIGH (ref 24–36)

## 2023-01-31 LAB — HEPARIN LEVEL (UNFRACTIONATED): Heparin Unfractionated: 0.7 IU/mL (ref 0.30–0.70)

## 2023-01-31 MED ORDER — METOPROLOL TARTRATE 50 MG PO TABS
50.0000 mg | ORAL_TABLET | Freq: Two times a day (BID) | ORAL | Status: DC
  Administered 2023-01-31: 50 mg via ORAL
  Filled 2023-01-31: qty 1

## 2023-01-31 MED ORDER — FUROSEMIDE 10 MG/ML IJ SOLN
40.0000 mg | Freq: Every day | INTRAMUSCULAR | Status: DC
  Administered 2023-02-01 – 2023-02-05 (×5): 40 mg via INTRAVENOUS
  Filled 2023-01-31 (×5): qty 4

## 2023-01-31 MED ORDER — METOPROLOL TARTRATE 50 MG PO TABS
50.0000 mg | ORAL_TABLET | Freq: Three times a day (TID) | ORAL | Status: DC
  Administered 2023-01-31 – 2023-02-05 (×14): 50 mg via ORAL
  Filled 2023-01-31 (×14): qty 1

## 2023-01-31 NOTE — Progress Notes (Signed)
ANTICOAGULATION CONSULT NOTE  Pharmacy Consult for heaprin Indication: atrial fibrillation  Allergies  Allergen Reactions   Lisinopril Cough   Paroxetine     REACTION: paranoia, confusion   Sertraline Hcl     REACTION: paranoia, confusion    Patient Measurements: Height: 5\' 9"  (175.3 cm) Weight: 124.5 kg (274 lb 8 oz) IBW/kg (Calculated) : 70.7 Heparin Dosing Weight: 99.4 kg  Vital Signs: Temp: 98.4 F (36.9 C) (07/27 0800) Temp Source: Oral (07/27 0800) BP: 95/57 (07/27 0800) Pulse Rate: 124 (07/27 0438)  Labs: Recent Labs    01/29/23 1426 01/29/23 1615 01/29/23 1929 01/30/23 0048 01/31/23 0823  HGB 16.0  --   --  16.5 15.3  HCT 51.4  --   --  50.7 51.1  PLT 219  --   --  221 232  APTT  --   --   --   --  43*  HEPARINUNFRC  --   --   --   --  0.77*  CREATININE 1.13  --   --  1.07 1.33*  TROPONINIHS  --  20* 19*  --   --     Estimated Creatinine Clearance: 78 mL/min (A) (by C-G formula based on SCr of 1.33 mg/dL (H)).   Medical History: Past Medical History:  Diagnosis Date   Acute appendicitis    Alcohol abuse, in remission 05/29/2015   Anxiety and depression 05/29/2015   Arthritis, degenerative 02/03/2013   Overview:  Of right middle finger MCP joint    Cannot sleep 02/03/2013   Chest pain 01/15/2013   Hypertension    Plantar fasciitis of left foot 01/17/2016    Medications:  Scheduled:   aspirin EC  81 mg Oral Daily   atorvastatin  40 mg Oral Daily   folic acid  1 mg Oral Daily   [START ON 02/01/2023] furosemide  40 mg Intravenous Daily   levothyroxine  50 mcg Oral Q0600   metoprolol tartrate  50 mg Oral Q8H   nicotine  21 mg Transdermal Daily   umeclidinium bromide  1 puff Inhalation Daily    Assessment: 57 yom presenting with AFib - no AC PTA. Was started on apixaban 5 mg BID (last dose 7/26@1158 ). Apixaban currently on hold due to plans for [facility] on Monday, 7/29. Pharmacy consulted to dose heparin.  Will require aPTT monitoring due to  likely falsely high anti-Xa level secondary to DOAC use.    This morning, heparin level was supratherapeutic at 0.77 and aPTT was subtherapeutic at 43 sec on heparin infusion at 1400 units/hr. Levels are not correlating.   Hgb 15.3, plt 232. No s/sx of bleeding noted and no issues with infusion running.  Goal of Therapy:  Heparin level 0.3-0.7 units/ml aPTT 66-102 seconds Monitor platelets by anticoagulation protocol: Yes   Plan:  Increase heparin infusion to 1700 units/hr  Obtain aPTT and heparin level in 6 hours Monitor aPTT levels until correlates with heparin level Monitor daily heparin level, CBC, and for s/sx of bleeding  Thank you for allowing pharmacy to participate in this patient's care.  Lennie Muckle, PharmD PGY1 Pharmacy Resident 01/31/2023 11:13 AM

## 2023-01-31 NOTE — Progress Notes (Signed)
PROGRESS NOTE    Ryan Hatfield  VHQ:[id number] DOB: 01-21-1966 DOA: 01/29/2023 PCP: Mort Sawyers, FNP    Brief Narrative:    Ryan Hatfield is a 57 y.o. male with medical history significant of tobacco abuse, COPD, alcohol abuse in remission, morbid obesity, anxiety with depression, diastolic dysfunction CHF presented to the ER with significant shortness of breath, cough and palpitations for 3 days with some leg swelling.  Patient also had PND and orthopnea.  In the ED, patient was tachycardic with irregular rhythm and was noted to have atrial flutter with rapid ventricular response.  Pulse ox was 87% on room air.  Patient was then admitted hospital and was started on Cardizem drip.    Assessment and Plan:   Atrial flutter with rapid ventricular response:  New diagnosis of atrial flutter.  Initially was on Cardizem drip.  Remained in rapid response so metoprolol was initiated.  Dose was adjusted today.  2D echocardiogram from 01/30/2023 showed LV ejection fraction of 55 to 60% with indeterminate diastolic parameters..  Cardiology on board and will follow recommendation.   TSH of 4.2.  On heparin drip.  Eliquis on hold.  Plan for cardiac catheterization.  Patient would likely benefit from assessment for sleep apnea as outpatient.    acute on chronic diastolic heart failure:  Likely triggered by atrial fibrillation. 2D echocardiogram with preserved LV function..  Continue losartan.  Received diuretics.  BNP slightly elevated at 123.  Patient does have peripheral edema.  Will continue with IV Lasix 40 twice daily.  Strict intake and output charting, daily weights.  Possible cardiac catheterization as per cardiology on 02/02/2023.   COPD: Continue nebulizers.  Changed to  Xopenex.   tobacco abuse: Cessation advised.  Continue  nicotine patch.   history of alcohol abuse: Patient currently in remission.  No signs of withdrawal.  History of hypertension.  Was on losartan.  Currently on hold.   On metoprolol at this time.      morbid obesity: Body mass index is 41.18 kg/m.  Would benefit from weight loss as outpatient.      DVT prophylaxis: Heparin drip   Code Status:     Code Status: Full Code  Disposition: Home likely in 1 to 2 days Status is: Inpatient  Remains inpatient appropriate because: A-fib with RVR, congestive heart failure, heparin drip, possible cardiac catheterization.    Family Communication: None at bedside  Consultants:  Cardiology  Procedures:  None  Antimicrobials:  None  Anti-infectives (From admission, onward)    None       Subjective: Today, patient was seen and examined at bedside.  Denies any chest pain, nausea, vomiting, fever, chills or rigor.  Denies any dizziness, lightheadedness.  Objective: Vitals:   01/31/23 0018 01/31/23 0438 01/31/23 0800 01/31/23 0809  BP: 128/68 124/77 (!) 95/57   Pulse: (!) 122 (!) 124    Resp: 15 17 17    Temp: 98.6 F (37 C) 99.6 F (37.6 C) 98.4 F (36.9 C)   TempSrc: Oral Oral Oral   SpO2: 96% 96%  95%  Weight:  124.5 kg    Height:        Intake/Output Summary (Last 24 hours) at 01/31/2023 1010 Last data filed at 01/31/2023 0515 Gross per 24 hour  Intake 250.99 ml  Output 2400 ml  Net -2149.01 ml   Filed Weights   01/29/23 1737 01/30/23 1249 01/31/23 0438  Weight: 126.5 kg 125.2 kg 124.5 kg    Physical Examination: Body mass  index is 40.54 kg/m.   General: Obese built, not in obvious distress, on nasal cannula oxygen HENT:   No scleral pallor or icterus noted. Oral mucosa is moist.  Chest:   Diminished breath sounds bilaterally.  Mild expiratory wheezes CVS: S1 &S2 heard. No murmur.  Irregular Abdomen: Soft, nontender, nondistended.  Bowel sounds are heard.   Extremities: No cyanosis, clubbing with bilateral lower extremity edema+ peripheral pulses are palpable. Psych: Alert, awake and oriented, normal mood CNS:  No cranial nerve deficits.  Power equal in all extremities.    Skin: Warm and dry.  No rashes noted.  Data Reviewed:   CBC: Recent Labs  Lab 01/29/23 1426 01/30/23 0048 01/31/23 0823  WBC 11.1* 10.8* 12.0*  HGB 16.0 16.5 15.3  HCT 51.4 50.7 51.1  MCV 91.0 93.4 92.4  PLT 219 221 232    Basic Metabolic Panel: Recent Labs  Lab 01/29/23 1426 01/29/23 1615 01/30/23 0048 01/31/23 0823  NA 142  --  143 139  K 4.1  --  4.1 4.0  CL 99  --  93* 90*  CO2 32  --  35* 35*  GLUCOSE 93  --  109* 118*  BUN 17  --  17 19  CREATININE 1.13  --  1.07 1.33*  CALCIUM 9.2  --  9.2 8.7*  MG  --  2.0  --  1.8    Liver Function Tests: No results for input(s): "AST", "ALT", "ALKPHOS", "BILITOT", "PROT", "ALBUMIN" in the last 168 hours.   Radiology Studies: ECHOCARDIOGRAM COMPLETE  Result Date: 01/30/2023    ECHOCARDIOGRAM REPORT   Patient Name:   Ryan Hatfield [facility] Date of Exam: 01/30/2023 Medical Rec #:  [id number]         Height:       69.0 in Accession #:    [id number]        Weight:       276.0 lb Date of Birth:  01-May-1966         BSA:          2.369 m Patient Age:    57 years          BP:           131/83 mmHg Patient Gender: M                 HR:           104 bpm. Exam Location:  Inpatient Procedure: 2D Echo, Cardiac Doppler and Color Doppler Indications:    I48.3 Typical atrial flutter  History:        Patient has prior history of Echocardiogram examinations, most                 recent 02/25/2022. Signs/Symptoms:Chest Pain; Risk                 Factors:Hypertension.  Sonographer:    Darlys Gales Referring Phys: [id number] MOHAMMAD L GARBA IMPRESSIONS  1. LVEF is normal Cannot fully evaluate regional wall motion as endocardium is not well seen. . Left ventricular ejection fraction, by estimation, is 55 to 60%. The left ventricle has normal function. The left ventricle has no regional wall motion abnormalities. Left ventricular diastolic parameters are indeterminate.  2. Right ventricular systolic function is normal. The right ventricular size is normal.  3.  Trivial mitral valve regurgitation.  4. The aortic valve is tricuspid. Aortic valve regurgitation is not visualized. Aortic valve sclerosis/calcification is present, without any evidence of aortic stenosis.  5. The inferior vena cava is normal in size with greater than 50% respiratory variability, suggesting right atrial pressure of 3 mmHg. Comparison(s): The left ventricular function is unchanged. FINDINGS  Left Ventricle: LVEF is normal Cannot fully evaluate regional wall motion as endocardium is not well seen. Left ventricular ejection fraction, by estimation, is 55 to 60%. The left ventricle has normal function. The left ventricle has no regional wall motion abnormalities. The left ventricular internal cavity size was normal in size. There is no left ventricular hypertrophy. Left ventricular diastolic parameters are indeterminate. Right Ventricle: The right ventricular size is normal. Right vetricular wall thickness was not assessed. Right ventricular systolic function is normal. Left Atrium: Left atrial size was normal in size. Right Atrium: Right atrial size was normal in size. Pericardium: There is no evidence of pericardial effusion. Mitral Valve: There is mild thickening of the mitral valve leaflet(s). Mild to moderate mitral annular calcification. Trivial mitral valve regurgitation. Tricuspid Valve: The tricuspid valve is normal in structure. Tricuspid valve regurgitation is trivial. Aortic Valve: The aortic valve is tricuspid. Aortic valve regurgitation is not visualized. Aortic valve sclerosis/calcification is present, without any evidence of aortic stenosis. Pulmonic Valve: The pulmonic valve was not well visualized. Pulmonic valve regurgitation is not visualized. Aorta: The aortic root and ascending aorta are structurally normal, with no evidence of dilitation. Venous: The inferior vena cava is normal in size with greater than 50% respiratory variability, suggesting right atrial pressure of 3 mmHg.  IAS/Shunts: No atrial level shunt detected by color flow Doppler.  LEFT VENTRICLE PLAX 2D LVIDd:         4.30 cm   Diastology LVIDs:         3.20 cm   LV e' medial:    8.49 cm/s LV PW:         1.10 cm   LV E/e' medial:  15.5 LV IVS:        1.10 cm   LV e' lateral:   7.94 cm/s LVOT diam:     1.90 cm   LV E/e' lateral: 16.6 LVOT Area:     2.84 cm  RIGHT VENTRICLE RV S prime:     8.81 cm/s TAPSE (M-mode): 1.9 cm LEFT ATRIUM             Index        RIGHT ATRIUM           Index LA Vol (A2C):   61.9 ml 26.13 ml/m  RA Area:     21.60 cm LA Vol (A4C):   98.3 ml 41.49 ml/m  RA Volume:   64.05 ml  27.04 ml/m LA Biplane Vol: 80.9 ml 34.15 ml/m   AORTA Ao Asc diam: 3.40 cm MITRAL VALVE                TRICUSPID VALVE MV Area (PHT): 3.00 cm     TR Peak grad:   25.4 mmHg MV Decel Time: 253 msec     TR Vmax:        252.00 cm/s MV E velocity: 132.00 cm/s                             SHUNTS                             Systemic Diam: 1.90 cm Dietrich Pates MD Electronically signed by Dietrich Pates MD Signature Date/Time:  01/30/2023/6:08:57 PM    Final    CT Angio Chest Pulmonary Embolism (PE) W or WO Contrast  Result Date: 01/29/2023 CLINICAL DATA:  Positive D-dimer. EXAM: CT ANGIOGRAPHY CHEST WITH CONTRAST TECHNIQUE: Multidetector CT imaging of the chest was performed using the standard protocol during bolus administration of intravenous contrast. Multiplanar CT image reconstructions and MIPs were obtained to evaluate the vascular anatomy. RADIATION DOSE REDUCTION: This exam was performed according to the departmental dose-optimization program which includes automated exposure control, adjustment of the mA and/or kV according to patient size and/or use of iterative reconstruction technique. CONTRAST:  75mL OMNIPAQUE IOHEXOL 350 MG/ML SOLN COMPARISON:  Radiograph of same day. FINDINGS: Cardiovascular: Satisfactory opacification of the pulmonary arteries to the segmental level. No evidence of pulmonary embolism. Normal heart size.  No pericardial effusion. Mediastinum/Nodes: No enlarged mediastinal, hilar, or axillary lymph nodes. Thyroid gland, trachea, and esophagus demonstrate no significant findings. Lungs/Pleura: No pneumothorax is noted. Small right pleural effusion is noted with adjacent atelectasis of the right lower lobe. Left lung is unremarkable. Upper Abdomen: No acute abnormality. Musculoskeletal: No chest wall abnormality. No acute or significant osseous findings. Review of the MIP images confirms the above findings. IMPRESSION: No definite evidence of pulmonary embolus. Small right pleural effusion is noted with adjacent atelectasis of right lower lobe. Aortic Atherosclerosis (ICD10-I70.0). Electronically Signed   By: Lupita Raider M.D.   On: 01/29/2023 18:02   DG Chest Port 1 View  Result Date: 01/29/2023 CLINICAL DATA:  5107 Atrial fibrillation (HCC) 5107 EXAM: PORTABLE CHEST 1 VIEW COMPARISON:  01/14/2022. FINDINGS: Mild pulmonary vascular congestion, which may be accentuated by low lung volume. Probable linear area of atelectasis overlying the right mid lung zone. There is homogeneous opacity obscuring the right lateral costophrenic angle, which may represent small right pleural effusion with probable associated compressive atelectasis. Left lateral costophrenic angle is clear. Bilateral lungs otherwise clear. Stable cardio-mediastinal silhouette. No acute osseous abnormalities. The soft tissues are within normal limits. IMPRESSION: *Mild pulmonary vascular congestion. Probable small right pleural effusion with associated compressive atelectasis. Electronically Signed   By: Jules Schick M.D.   On: 01/29/2023 16:00      LOS: 2 days     Joycelyn Das, MD [facility] Available via Epic secure chat 7am-7pm After these hours, please refer to coverage provider listed on [email or web address] 01/31/2023, 10:10 AM

## 2023-01-31 NOTE — Progress Notes (Signed)
ANTICOAGULATION CONSULT NOTE  Pharmacy Consult for heaprin Indication: atrial fibrillation  Allergies  Allergen Reactions   Lisinopril Cough   Paroxetine     REACTION: paranoia, confusion   Sertraline Hcl     REACTION: paranoia, confusion    Patient Measurements: Height: 5\' 9"  (175.3 cm) Weight: 124.5 kg (274 lb 8 oz) IBW/kg (Calculated) : 70.7 Heparin Dosing Weight: 99.4 kg  Vital Signs: Temp: 98.4 F (36.9 C) (07/27 1526) Temp Source: Oral (07/27 1526) BP: 122/72 (07/27 1526)  Labs: Recent Labs    01/29/23 1426 01/29/23 1615 01/29/23 1929 01/30/23 0048 01/31/23 0823 01/31/23 1802  HGB 16.0  --   --  16.5 15.3  --   HCT 51.4  --   --  50.7 51.1  --   PLT 219  --   --  221 232  --   APTT  --   --   --   --  43* 63*  HEPARINUNFRC  --   --   --   --  0.77* 0.70  CREATININE 1.13  --   --  1.07 1.33*  --   TROPONINIHS  --  20* 19*  --   --   --     Estimated Creatinine Clearance: 78 mL/min (A) (by C-G formula based on SCr of 1.33 mg/dL (H)).   Assessment: 73 yom presenting with AFib - no AC PTA. Was started on apixaban 5 mg BID (last dose 7/26@1158 ). Apixaban currently on hold due to plans for [facility] on Monday, 7/29. Pharmacy consulted to dose heparin.  Will require aPTT monitoring due to likely falsely high anti-Xa level secondary to DOAC use.    Heparin level 0.7 (affected by apixaban), aPTT slightly low at 63 on infusion at 1700 units/hr. No issues with line or bleeding reported per RN.  Goal of Therapy:  Heparin level 0.3-0.7 units/ml aPTT 66-102 seconds Monitor platelets by anticoagulation protocol: Yes   Plan:  Increase heparin infusion to 1850 units/hr  F/u 8h aPTT  Thank you for allowing pharmacy to participate in this patient's care.  Christoper Fabian, PharmD, BCPS Please see amion for complete clinical pharmacist phone list 01/31/2023 7:31 PM

## 2023-01-31 NOTE — Progress Notes (Signed)
Rounding Note    Patient Name: Ryan Hatfield Date of Encounter: 01/31/2023   [facility] Cardiologist: Debbe Odea, MD   Subjective   Feeling well without complaint.  Respiratory status is improved.  Inpatient Medications    Scheduled Meds:  aspirin EC  81 mg Oral Daily   atorvastatin  40 mg Oral Daily   folic acid  1 mg Oral Daily   furosemide  40 mg Intravenous BID   levothyroxine  50 mcg Oral Q0600   metoprolol tartrate  50 mg Oral BID   nicotine  21 mg Transdermal Daily   umeclidinium bromide  1 puff Inhalation Daily   Continuous Infusions:  heparin 1,400 Units/hr (01/31/23 0515)   PRN Meds: acetaminophen, levalbuterol, ondansetron (ZOFRAN) IV, traZODone   Vital Signs    Vitals:   01/31/23 0018 01/31/23 0438 01/31/23 0800 01/31/23 0809  BP: 128/68 124/77 (!) 95/57   Pulse: (!) 122 (!) 124    Resp: 15 17 17    Temp: 98.6 F (37 C) 99.6 F (37.6 C) 98.4 F (36.9 C)   TempSrc: Oral Oral Oral   SpO2: 96% 96%  95%  Weight:  124.5 kg    Height:        Intake/Output Summary (Last 24 hours) at 01/31/2023 1010 Last data filed at 01/31/2023 0515 Gross per 24 hour  Intake 250.99 ml  Output 2400 ml  Net -2149.01 ml      01/31/2023    4:38 AM 01/30/2023   12:49 PM 01/29/2023    5:37 PM  Last 3 Weights  Weight (lbs) 274 lb 8 oz 276 lb 278 lb 14.1 oz  Weight (kg) 124.512 kg 125.193 kg 126.5 kg      Telemetry    Atrial flutter- Personally Reviewed  ECG    Atrial flutter- Personally Reviewed  Physical Exam   GEN: No acute distress.   Neck: No JVD Cardiac: Tachycardic, irregular, no murmurs, rubs, or gallops.  Respiratory: Clear to auscultation bilaterally. GI: Soft, nontender, non-distended  MS: No edema; No deformity. Neuro:  Nonfocal  Psych: Normal affect   Labs    High Sensitivity Troponin:   Recent Labs  Lab 01/29/23 1615 01/29/23 1929  TROPONINIHS 20* 19*     Chemistry Recent Labs  Lab 01/29/23 1426 01/29/23 1615  01/30/23 0048 01/31/23 0823  NA 142  --  143 139  K 4.1  --  4.1 4.0  CL 99  --  93* 90*  CO2 32  --  35* 35*  GLUCOSE 93  --  109* 118*  BUN 17  --  17 19  CREATININE 1.13  --  1.07 1.33*  CALCIUM 9.2  --  9.2 8.7*  MG  --  2.0  --  1.8  GFRNONAA >60  --  >60 >60  ANIONGAP 11  --  15 14    Lipids  Recent Labs  Lab 01/30/23 0048  CHOL 126  TRIG 96  HDL 28*  LDLCALC 79  CHOLHDL 4.5    Hematology Recent Labs  Lab 01/29/23 1426 01/30/23 0048 01/31/23 0823  WBC 11.1* 10.8* 12.0*  RBC 5.65 5.43 5.53  HGB 16.0 16.5 15.3  HCT 51.4 50.7 51.1  MCV 91.0 93.4 92.4  MCH 28.3 30.4 27.7  MCHC 31.1 32.5 29.9*  RDW 14.3 14.6 14.1  PLT 219 221 232   Thyroid  Recent Labs  Lab 01/29/23 1929  TSH 4.222    BNP Recent Labs  Lab 01/29/23 1422  BNP  123.1*    DDimer  Recent Labs  Lab 01/29/23 1615  DDIMER 1.17*     Radiology    ECHOCARDIOGRAM COMPLETE  Result Date: 01/30/2023    ECHOCARDIOGRAM REPORT   Patient Name:   Ryan Hatfield [facility] Date of Exam: 01/30/2023 Medical Rec #:  [id number]         Height:       69.0 in Accession #:    [id number]        Weight:       276.0 lb Date of Birth:  1966-05-24         BSA:          2.369 m Patient Age:    57 years          BP:           131/83 mmHg Patient Gender: M                 HR:           104 bpm. Exam Location:  Inpatient Procedure: 2D Echo, Cardiac Doppler and Color Doppler Indications:    I48.3 Typical atrial flutter  History:        Patient has prior history of Echocardiogram examinations, most                 recent 02/25/2022. Signs/Symptoms:Chest Pain; Risk                 Factors:Hypertension.  Sonographer:    Darlys Gales Referring Phys: [id number] MOHAMMAD L GARBA IMPRESSIONS  1. LVEF is normal Cannot fully evaluate regional wall motion as endocardium is not well seen. . Left ventricular ejection fraction, by estimation, is 55 to 60%. The left ventricle has normal function. The left ventricle has no regional wall motion  abnormalities. Left ventricular diastolic parameters are indeterminate.  2. Right ventricular systolic function is normal. The right ventricular size is normal.  3. Trivial mitral valve regurgitation.  4. The aortic valve is tricuspid. Aortic valve regurgitation is not visualized. Aortic valve sclerosis/calcification is present, without any evidence of aortic stenosis.  5. The inferior vena cava is normal in size with greater than 50% respiratory variability, suggesting right atrial pressure of 3 mmHg. Comparison(s): The left ventricular function is unchanged. FINDINGS  Left Ventricle: LVEF is normal Cannot fully evaluate regional wall motion as endocardium is not well seen. Left ventricular ejection fraction, by estimation, is 55 to 60%. The left ventricle has normal function. The left ventricle has no regional wall motion abnormalities. The left ventricular internal cavity size was normal in size. There is no left ventricular hypertrophy. Left ventricular diastolic parameters are indeterminate. Right Ventricle: The right ventricular size is normal. Right vetricular wall thickness was not assessed. Right ventricular systolic function is normal. Left Atrium: Left atrial size was normal in size. Right Atrium: Right atrial size was normal in size. Pericardium: There is no evidence of pericardial effusion. Mitral Valve: There is mild thickening of the mitral valve leaflet(s). Mild to moderate mitral annular calcification. Trivial mitral valve regurgitation. Tricuspid Valve: The tricuspid valve is normal in structure. Tricuspid valve regurgitation is trivial. Aortic Valve: The aortic valve is tricuspid. Aortic valve regurgitation is not visualized. Aortic valve sclerosis/calcification is present, without any evidence of aortic stenosis. Pulmonic Valve: The pulmonic valve was not well visualized. Pulmonic valve regurgitation is not visualized. Aorta: The aortic root and ascending aorta are structurally normal, with no  evidence of dilitation. Venous: The inferior vena cava is normal  in size with greater than 50% respiratory variability, suggesting right atrial pressure of 3 mmHg. IAS/Shunts: No atrial level shunt detected by color flow Doppler.  LEFT VENTRICLE PLAX 2D LVIDd:         4.30 cm   Diastology LVIDs:         3.20 cm   LV e' medial:    8.49 cm/s LV PW:         1.10 cm   LV E/e' medial:  15.5 LV IVS:        1.10 cm   LV e' lateral:   7.94 cm/s LVOT diam:     1.90 cm   LV E/e' lateral: 16.6 LVOT Area:     2.84 cm  RIGHT VENTRICLE RV S prime:     8.81 cm/s TAPSE (M-mode): 1.9 cm LEFT ATRIUM             Index        RIGHT ATRIUM           Index LA Vol (A2C):   61.9 ml 26.13 ml/m  RA Area:     21.60 cm LA Vol (A4C):   98.3 ml 41.49 ml/m  RA Volume:   64.05 ml  27.04 ml/m LA Biplane Vol: 80.9 ml 34.15 ml/m   AORTA Ao Asc diam: 3.40 cm MITRAL VALVE                TRICUSPID VALVE MV Area (PHT): 3.00 cm     TR Peak grad:   25.4 mmHg MV Decel Time: 253 msec     TR Vmax:        252.00 cm/s MV E velocity: 132.00 cm/s                             SHUNTS                             Systemic Diam: 1.90 cm Dietrich Pates MD Electronically signed by Dietrich Pates MD Signature Date/Time: 01/30/2023/6:08:57 PM    Final    CT Angio Chest Pulmonary Embolism (PE) W or WO Contrast  Result Date: 01/29/2023 CLINICAL DATA:  Positive D-dimer. EXAM: CT ANGIOGRAPHY CHEST WITH CONTRAST TECHNIQUE: Multidetector CT imaging of the chest was performed using the standard protocol during bolus administration of intravenous contrast. Multiplanar CT image reconstructions and MIPs were obtained to evaluate the vascular anatomy. RADIATION DOSE REDUCTION: This exam was performed according to the departmental dose-optimization program which includes automated exposure control, adjustment of the mA and/or kV according to patient size and/or use of iterative reconstruction technique. CONTRAST:  75mL OMNIPAQUE IOHEXOL 350 MG/ML SOLN COMPARISON:  Radiograph of same  day. FINDINGS: Cardiovascular: Satisfactory opacification of the pulmonary arteries to the segmental level. No evidence of pulmonary embolism. Normal heart size. No pericardial effusion. Mediastinum/Nodes: No enlarged mediastinal, hilar, or axillary lymph nodes. Thyroid gland, trachea, and esophagus demonstrate no significant findings. Lungs/Pleura: No pneumothorax is noted. Small right pleural effusion is noted with adjacent atelectasis of the right lower lobe. Left lung is unremarkable. Upper Abdomen: No acute abnormality. Musculoskeletal: No chest wall abnormality. No acute or significant osseous findings. Review of the MIP images confirms the above findings. IMPRESSION: No definite evidence of pulmonary embolus. Small right pleural effusion is noted with adjacent atelectasis of right lower lobe. Aortic Atherosclerosis (ICD10-I70.0). Electronically Signed   By: Zenda Alpers.D.  On: 01/29/2023 18:02   DG Chest Port 1 View  Result Date: 01/29/2023 CLINICAL DATA:  5107 Atrial fibrillation (HCC) 5107 EXAM: PORTABLE CHEST 1 VIEW COMPARISON:  01/14/2022. FINDINGS: Mild pulmonary vascular congestion, which may be accentuated by low lung volume. Probable linear area of atelectasis overlying the right mid lung zone. There is homogeneous opacity obscuring the right lateral costophrenic angle, which may represent small right pleural effusion with probable associated compressive atelectasis. Left lateral costophrenic angle is clear. Bilateral lungs otherwise clear. Stable cardio-mediastinal silhouette. No acute osseous abnormalities. The soft tissues are within normal limits. IMPRESSION: *Mild pulmonary vascular congestion. Probable small right pleural effusion with associated compressive atelectasis. Electronically Signed   By: Jules Schick M.D.   On: 01/29/2023 16:00    Cardiac Studies   TTE  1. LVEF is normal Cannot fully evaluate regional wall motion as  endocardium is not well seen. . Left ventricular  ejection fraction, by  estimation, is 55 to 60%. The left ventricle has normal function. The left  ventricle has no regional wall motion  abnormalities. Left ventricular diastolic parameters are indeterminate.   2. Right ventricular systolic function is normal. The right ventricular  size is normal.   3. Trivial mitral valve regurgitation.   4. The aortic valve is tricuspid. Aortic valve regurgitation is not  visualized. Aortic valve sclerosis/calcification is present, without any  evidence of aortic stenosis.   5. The inferior vena cava is normal in size with greater than 50%  respiratory variability, suggesting right atrial pressure of 3 mmHg.   Patient Profile     57 y.o. male present to the hospital with chest pain and shortness of breath, found to be in rapid atrial flutter.  Assessment & Plan    1.  Typical atrial flutter: Likely the cause of his symptoms.  He did have chest pain during the episode.  Currently on heparin with plan for left heart catheterization on Monday due to chest discomfort and elevated troponin.  Emaad Nanna need rhythm control post catheterization.  May require TEE/cardioversion.  2.  Acute diastolic heart failure: Likely due to rapid atrial flutter.  Ejection fraction normal.  Net -2 L.  Creatinine mildly elevated.  Dawnyel Leven reduce Lasix to daily dosing.  3.  Chest pain: Occurred with rapid atrial flutter.  This could all be due to his tachycardia.  He does have a long smoking history.  Gamal Todisco plan for left heart catheterization Monday.     For questions or updates, please contact [facility] Please consult [email or web address] for contact info under        Signed, Azure Budnick Jorja Loa, MD  01/31/2023, 10:10 AM

## 2023-02-01 DIAGNOSIS — I4892 Unspecified atrial flutter: Secondary | ICD-10-CM | POA: Diagnosis not present

## 2023-02-01 DIAGNOSIS — I5031 Acute diastolic (congestive) heart failure: Secondary | ICD-10-CM | POA: Diagnosis not present

## 2023-02-01 DIAGNOSIS — E039 Hypothyroidism, unspecified: Secondary | ICD-10-CM | POA: Diagnosis not present

## 2023-02-01 DIAGNOSIS — F1011 Alcohol abuse, in remission: Secondary | ICD-10-CM | POA: Diagnosis not present

## 2023-02-01 LAB — BASIC METABOLIC PANEL WITH GFR
Anion gap: 9 (ref 5–15)
BUN: 17 mg/dL (ref 6–20)
CO2: 38 mmol/L — ABNORMAL HIGH (ref 22–32)
Calcium: 8.9 mg/dL (ref 8.9–10.3)
Chloride: 88 mmol/L — ABNORMAL LOW (ref 98–111)
Creatinine, Ser: 1.14 mg/dL (ref 0.61–1.24)
GFR, Estimated: >60 mL/min (ref >60)
Glucose, Bld: 116 mg/dL — ABNORMAL HIGH (ref 70–99)
Potassium: 3.9 mmol/L (ref 3.5–5.1)
Sodium: 135 mmol/L (ref 135–145)

## 2023-02-01 LAB — CBC
HCT: 51.3 % (ref 39.0–52.0)
Hemoglobin: 15.7 g/dL (ref 13.0–17.0)
MCH: 27.6 pg (ref 26.0–34.0)
MCHC: 30.6 g/dL (ref 30.0–36.0)
MCV: 90.3 fL (ref 80.0–100.0)
Platelets: 197 10*3/uL (ref 150–400)
RBC: 5.68 MIL/uL (ref 4.22–5.81)
RDW: 13.9 % (ref 11.5–15.5)
WBC: 10.6 10*3/uL — ABNORMAL HIGH (ref 4.0–10.5)
nRBC: 0 % (ref 0.0–0.2)

## 2023-02-01 LAB — HEPARIN LEVEL (UNFRACTIONATED): Heparin Unfractionated: 0.83 [IU]/mL — ABNORMAL HIGH (ref 0.30–0.70)

## 2023-02-01 LAB — MAGNESIUM: Magnesium: 1.8 mg/dL (ref 1.7–2.4)

## 2023-02-01 LAB — APTT: aPTT: 80 s — ABNORMAL HIGH (ref 24–36)

## 2023-02-01 MED ORDER — OFF THE BEAT BOOK
Freq: Once | Status: AC
  Filled 2023-02-01: qty 1

## 2023-02-01 MED ORDER — SODIUM CHLORIDE 0.9 % IV SOLN: INTRAVENOUS | Status: DC

## 2023-02-01 MED ORDER — ASPIRIN 81 MG PO CHEW
81.0000 mg | CHEWABLE_TABLET | ORAL | Status: AC
  Administered 2023-02-02: 81 mg via ORAL
  Filled 2023-02-01: qty 1

## 2023-02-01 MED ORDER — ASPIRIN 81 MG PO TBEC
81.0000 mg | DELAYED_RELEASE_TABLET | Freq: Every day | ORAL | Status: DC
  Administered 2023-02-03 – 2023-02-05 (×3): 81 mg via ORAL
  Filled 2023-02-01 (×3): qty 1

## 2023-02-01 NOTE — Progress Notes (Signed)
Rounding Note    Patient Name: Ryan Hatfield Date of Encounter: 02/01/2023  [facility] Cardiologist: Debbe Odea, MD   Subjective   Continues to feel fatigued but otherwise without complaint.  Does state that he has been fatigued for the last few weeks, finding it difficult to do his daily activities.  Inpatient Medications    Scheduled Meds:  aspirin EC  81 mg Oral Daily   atorvastatin  40 mg Oral Daily   folic acid  1 mg Oral Daily   furosemide  40 mg Intravenous Daily   levothyroxine  50 mcg Oral Q0600   metoprolol tartrate  50 mg Oral Q8H   nicotine  21 mg Transdermal Daily   umeclidinium bromide  1 puff Inhalation Daily   Continuous Infusions:  heparin 1,850 Units/hr (02/01/23 0619)   PRN Meds: acetaminophen, levalbuterol, ondansetron (ZOFRAN) IV, traZODone   Vital Signs    Vitals:   02/01/23 0358 02/01/23 0528 02/01/23 0803 02/01/23 0821  BP: 118/78 120/75 120/69   Pulse: (!) 108 (!) 105 97   Resp: 20  16   Temp: 98.2 F (36.8 C)  98.1 F (36.7 C)   TempSrc: Oral  Oral   SpO2: 94%  95% 96%  Weight: 125.8 kg     Height:        Intake/Output Summary (Last 24 hours) at 02/01/2023 0938 Last data filed at 02/01/2023 0534 Gross per 24 hour  Intake 1046.82 ml  Output 2825 ml  Net -1778.18 ml      02/01/2023    3:58 AM 01/31/2023    4:38 AM 01/30/2023   12:49 PM  Last 3 Weights  Weight (lbs) 277 lb 6.4 oz 274 lb 8 oz 276 lb  Weight (kg) 125.828 kg 124.512 kg 125.193 kg      Telemetry    Atrial flutter-personally reviewed  ECG    None new  Physical Exam   GEN: Well nourished, well developed, in no acute distress  HEENT: normal  Neck: no JVD, carotid bruits, or masses Cardiac: irregular, tachycardic; no murmurs, rubs, or gallops,no edema  Respiratory:  clear to auscultation bilaterally, normal work of breathing GI: soft, nontender, nondistended, + BS MS: no deformity or atrophy  Skin: warm and dry Neuro:  Strength and  sensation are intact Psych: euthymic mood, full affect   Labs    High Sensitivity Troponin:   Recent Labs  Lab 01/29/23 1615 01/29/23 1929  TROPONINIHS 20* 19*     Chemistry Recent Labs  Lab 01/29/23 1615 01/30/23 0048 01/31/23 0823 02/01/23 0121  NA  --  143 139 135  K  --  4.1 4.0 3.9  CL  --  93* 90* 88*  CO2  --  35* 35* 38*  GLUCOSE  --  109* 118* 116*  BUN  --  17 19 17   CREATININE  --  1.07 1.33* 1.14  CALCIUM  --  9.2 8.7* 8.9  MG 2.0  --  1.8 1.8  GFRNONAA  --  >60 >60 >60  ANIONGAP  --  15 14 9     Lipids  Recent Labs  Lab 01/30/23 0048  CHOL 126  TRIG 96  HDL 28*  LDLCALC 79  CHOLHDL 4.5    Hematology Recent Labs  Lab 01/30/23 0048 01/31/23 0823 02/01/23 0121  WBC 10.8* 12.0* 10.6*  RBC 5.43 5.53 5.68  HGB 16.5 15.3 15.7  HCT 50.7 51.1 51.3  MCV 93.4 92.4 90.3  MCH 30.4 27.7 27.6  MCHC 32.5 29.9* 30.6  RDW 14.6 14.1 13.9  PLT 221 232 197   Thyroid  Recent Labs  Lab 01/29/23 1929  TSH 4.222    BNP Recent Labs  Lab 01/29/23 1422  BNP 123.1*    DDimer  Recent Labs  Lab 01/29/23 1615  DDIMER 1.17*     Radiology    ECHOCARDIOGRAM COMPLETE  Result Date: 01/30/2023    ECHOCARDIOGRAM REPORT   Patient Name:   JIMMYE MAULE [facility] Date of Exam: 01/30/2023 Medical Rec #:  [id number]         Height:       69.0 in Accession #:    [id number]        Weight:       276.0 lb Date of Birth:  Nov 13, 1965         BSA:          2.369 m Patient Age:    57 years          BP:           131/83 mmHg Patient Gender: M                 HR:           104 bpm. Exam Location:  Inpatient Procedure: 2D Echo, Cardiac Doppler and Color Doppler Indications:    I48.3 Typical atrial flutter  History:        Patient has prior history of Echocardiogram examinations, most                 recent 02/25/2022. Signs/Symptoms:Chest Pain; Risk                 Factors:Hypertension.  Sonographer:    Darlys Gales Referring Phys: [id number] MOHAMMAD L GARBA IMPRESSIONS  1. LVEF is normal  Cannot fully evaluate regional wall motion as endocardium is not well seen. . Left ventricular ejection fraction, by estimation, is 55 to 60%. The left ventricle has normal function. The left ventricle has no regional wall motion abnormalities. Left ventricular diastolic parameters are indeterminate.  2. Right ventricular systolic function is normal. The right ventricular size is normal.  3. Trivial mitral valve regurgitation.  4. The aortic valve is tricuspid. Aortic valve regurgitation is not visualized. Aortic valve sclerosis/calcification is present, without any evidence of aortic stenosis.  5. The inferior vena cava is normal in size with greater than 50% respiratory variability, suggesting right atrial pressure of 3 mmHg. Comparison(s): The left ventricular function is unchanged. FINDINGS  Left Ventricle: LVEF is normal Cannot fully evaluate regional wall motion as endocardium is not well seen. Left ventricular ejection fraction, by estimation, is 55 to 60%. The left ventricle has normal function. The left ventricle has no regional wall motion abnormalities. The left ventricular internal cavity size was normal in size. There is no left ventricular hypertrophy. Left ventricular diastolic parameters are indeterminate. Right Ventricle: The right ventricular size is normal. Right vetricular wall thickness was not assessed. Right ventricular systolic function is normal. Left Atrium: Left atrial size was normal in size. Right Atrium: Right atrial size was normal in size. Pericardium: There is no evidence of pericardial effusion. Mitral Valve: There is mild thickening of the mitral valve leaflet(s). Mild to moderate mitral annular calcification. Trivial mitral valve regurgitation. Tricuspid Valve: The tricuspid valve is normal in structure. Tricuspid valve regurgitation is trivial. Aortic Valve: The aortic valve is tricuspid. Aortic valve regurgitation is not visualized. Aortic valve sclerosis/calcification is  present, without any evidence of  aortic stenosis. Pulmonic Valve: The pulmonic valve was not well visualized. Pulmonic valve regurgitation is not visualized. Aorta: The aortic root and ascending aorta are structurally normal, with no evidence of dilitation. Venous: The inferior vena cava is normal in size with greater than 50% respiratory variability, suggesting right atrial pressure of 3 mmHg. IAS/Shunts: No atrial level shunt detected by color flow Doppler.  LEFT VENTRICLE PLAX 2D LVIDd:         4.30 cm   Diastology LVIDs:         3.20 cm   LV e' medial:    8.49 cm/s LV PW:         1.10 cm   LV E/e' medial:  15.5 LV IVS:        1.10 cm   LV e' lateral:   7.94 cm/s LVOT diam:     1.90 cm   LV E/e' lateral: 16.6 LVOT Area:     2.84 cm  RIGHT VENTRICLE RV S prime:     8.81 cm/s TAPSE (M-mode): 1.9 cm LEFT ATRIUM             Index        RIGHT ATRIUM           Index LA Vol (A2C):   61.9 ml 26.13 ml/m  RA Area:     21.60 cm LA Vol (A4C):   98.3 ml 41.49 ml/m  RA Volume:   64.05 ml  27.04 ml/m LA Biplane Vol: 80.9 ml 34.15 ml/m   AORTA Ao Asc diam: 3.40 cm MITRAL VALVE                TRICUSPID VALVE MV Area (PHT): 3.00 cm     TR Peak grad:   25.4 mmHg MV Decel Time: 253 msec     TR Vmax:        252.00 cm/s MV E velocity: 132.00 cm/s                             SHUNTS                             Systemic Diam: 1.90 cm Dietrich Pates MD Electronically signed by Dietrich Pates MD Signature Date/Time: 01/30/2023/6:08:57 PM    Final     Cardiac Studies   TTE  1. LVEF is normal Cannot fully evaluate regional wall motion as  endocardium is not well seen. . Left ventricular ejection fraction, by  estimation, is 55 to 60%. The left ventricle has normal function. The left  ventricle has no regional wall motion  abnormalities. Left ventricular diastolic parameters are indeterminate.   2. Right ventricular systolic function is normal. The right ventricular  size is normal.   3. Trivial mitral valve regurgitation.   4.  The aortic valve is tricuspid. Aortic valve regurgitation is not  visualized. Aortic valve sclerosis/calcification is present, without any  evidence of aortic stenosis.   5. The inferior vena cava is normal in size with greater than 50%  respiratory variability, suggesting right atrial pressure of 3 mmHg.   Patient Profile     57 y.o. male present to the hospital with chest pain and shortness of breath, found to be in rapid atrial flutter.  Assessment & Plan    1.  Typical atrial flutter: Likely the cause of the patient's symptoms.  He presented with significant tachycardia.  Currently  on heparin.  Penne Rosenstock likely need TEE/cardioversion this admission.  May need antiarrhythmics based on left heart catheterization post cardioversion.  Ablation would be a reasonable option.  He can follow-up as an outpatient.  2.  Acute diastolic heart failure: Likely due to rapid atrial flutter.  Net -3.9 L and feeling improved.  Continue diuresis.  3.  Chest pain: Has a long smoking history.  Had chest pain with rapid atrial flutter.  Current plan for left heart catheterization tomorrow.  The patient understands that risks include but are not limited to stroke (1 in 1000), death (1 in 1000), kidney failure [usually temporary] (1 in 500), bleeding (1 in 200), allergic reaction [possibly serious] (1 in 200), and agrees to proceed.      For questions or updates, please contact [facility] Please consult [email or web address] for contact info under        Signed, Joeanne Robicheaux Jorja Loa, MD  02/01/2023, 9:38 AM

## 2023-02-01 NOTE — Progress Notes (Signed)
ANTICOAGULATION CONSULT NOTE  Pharmacy Consult for heaprin Indication: atrial fibrillation  Allergies  Allergen Reactions   Lisinopril Cough   Paroxetine     REACTION: paranoia, confusion   Sertraline Hcl     REACTION: paranoia, confusion    Patient Measurements: Height: 5\' 9"  (175.3 cm) Weight: 125.8 kg (277 lb 6.4 oz) IBW/kg (Calculated) : 70.7 Heparin Dosing Weight: 99.4 kg  Vital Signs: Temp: 98.2 F (36.8 C) (07/28 0358) Temp Source: Oral (07/28 0358) BP: 120/75 (07/28 0528) Pulse Rate: 105 (07/28 0528)  Labs: Recent Labs    01/29/23 1615 01/29/23 1929 01/30/23 0048 01/31/23 0823 01/31/23 1802 02/01/23 0121  HGB  --   --  16.5 15.3  --  15.7  HCT  --   --  50.7 51.1  --  51.3  PLT  --   --  221 232  --  197  APTT  --   --   --  43* 63* 80*  HEPARINUNFRC  --   --   --  0.77* 0.70 0.83*  CREATININE  --   --  1.07 1.33*  --  1.14  TROPONINIHS 20* 19*  --   --   --   --     Estimated Creatinine Clearance: 91.5 mL/min (by C-G formula based on SCr of 1.14 mg/dL).   Assessment: 47 yom presenting with AFib - no AC PTA. Was started on apixaban 5 mg BID (last dose 7/26@1158 ). Apixaban currently on hold due to plans for [facility] on Monday, 7/29. Pharmacy consulted to dose heparin.  Will require aPTT monitoring due to likely falsely high anti-Xa level secondary to DOAC use.    Heparin level 0.83 (affected by apixaban), aPTT therapeutic at 80 sec on infusion at 1850 units/hr. No issues with infusion or bleeding reported per RN.  Goal of Therapy:  Heparin level 0.3-0.7 units/ml aPTT 66-102 seconds Monitor platelets by anticoagulation protocol: Yes   Plan:  Continue heparin infusion at 1850 units/hr Obtain aPTT and heparin level daily  Monitor aPPT levels until correlates with heparin level Monitor CBC and s/sx of bleeding daily  Thank you for allowing pharmacy to participate in this patient's care.  Lennie Muckle, PharmD PGY1 Pharmacy Resident 02/01/2023 7:33  AM

## 2023-02-01 NOTE — Progress Notes (Signed)
PROGRESS NOTE    Ryan Hatfield  ZOX:[id number] DOB: 11/10/1965 DOA: 01/29/2023 PCP: Mort Sawyers, FNP    Brief Narrative:   Ryan Hatfield is a 57 y.o. male with medical history significant of tobacco abuse, COPD, alcohol abuse in remission, morbid obesity, anxiety with depression, diastolic dysfunction CHF presented to the ER with significant shortness of breath, cough and palpitations for 3 days with some leg swelling.  Patient also had PND and orthopnea.  In the ED, patient was tachycardic with irregular rhythm and was noted to have atrial flutter with rapid ventricular response.  Pulse ox was 87% on room air.  Patient was then admitted hospital for further evaluation and treatment. Patient  Assessment and Plan:   Difficult atrial flutter with rapid ventricular response:  New diagnosis of atrial flutter.  Initially was on Cardizem drip.  Remained in rapid response so metoprolol was initiated.  Electrophysiology cardiology on board and will follow recommendation..  2D echocardiogram from 01/30/2023 showed LV ejection fraction of 55 to 60% with indeterminate diastolic parameters. Cardiology on board and will follow recommendation.  Patient will likely need cardioversion and cardiac cath. TSH of 4.2.  On heparin drip.  Eliquis on hold.    Patient would likely benefit from assessment for sleep apnea as outpatient.    acute on chronic diastolic heart failure:  Likely triggered by atrial fibrillation. 2D echocardiogram with preserved LV function..  Continue aspirin Lipitor Lasix.   Strict intake and output charting, daily weights.  Possible cardiac catheterization as per cardiology on 02/02/2023.   COPD: Continue Xopenex.   tobacco abuse:Continue  nicotine patch.   history of alcohol abuse: Patient currently in remission.  No signs of withdrawal.  History of hypertension.  Was on losartan.  Currently on hold.  Continue metoprolol    morbid obesity: Body mass index is 41.18 kg/m.   Would benefit from weight loss as outpatient.      DVT prophylaxis: Heparin drip   Code Status:     Code Status: Full Code  Disposition: Home likely in 1 to 2 days, when okay with cardiology  Status is: Inpatient  Remains inpatient appropriate because: A-fib with RVR, congestive heart failure, heparin drip, possible cardiac catheterization/DC cardioversion.    Family Communication: None at bedside  Consultants:  Cardiology  Procedures:  None   Antimicrobials:  None  Anti-infectives (From admission, onward)    None       Subjective: Today, patient was seen and examined at bedside.  Denies any chest pain, shortness of breath, dyspnea.  Feels fatigued and weak.  Objective: Vitals:   02/01/23 0358 02/01/23 0528 02/01/23 0803 02/01/23 0821  BP: 118/78 120/75 120/69   Pulse: (!) 108 (!) 105 97   Resp: 20  16   Temp: 98.2 F (36.8 C)  98.1 F (36.7 C)   TempSrc: Oral  Oral   SpO2: 94%  95% 96%  Weight: 125.8 kg     Height:        Intake/Output Summary (Last 24 hours) at 02/01/2023 1501 Last data filed at 02/01/2023 0954 Gross per 24 hour  Intake 1046.82 ml  Output 1800 ml  Net -753.18 ml   Filed Weights   01/30/23 1249 01/31/23 0438 02/01/23 0358  Weight: 125.2 kg 124.5 kg 125.8 kg    Physical Examination: Body mass index is 40.96 kg/m.   General: Obese built, not in obvious distress, on nasal cannula oxygen, Communicative HENT:   No scleral pallor or icterus noted. Oral mucosa  is moist.  Chest:   Diminished breath sounds bilaterally.   CVS: S1 &S2 heard. No murmur.  Irregularly irregular rhythm with tachycardia. Abdomen: Soft, nontender, nondistended.  Bowel sounds are heard.   Extremities: No cyanosis, clubbing with bilateral lower extremity edema++  Psych: Alert, awake and oriented, normal mood CNS:  No cranial nerve deficits.  Power equal in all extremities.   Skin: Warm and dry.  No rashes noted.  Data Reviewed:   CBC: Recent Labs  Lab  01/29/23 1426 01/30/23 0048 01/31/23 0823 02/01/23 0121  WBC 11.1* 10.8* 12.0* 10.6*  HGB 16.0 16.5 15.3 15.7  HCT 51.4 50.7 51.1 51.3  MCV 91.0 93.4 92.4 90.3  PLT 219 221 232 197    Basic Metabolic Panel: Recent Labs  Lab 01/29/23 1426 01/29/23 1615 01/30/23 0048 01/31/23 0823 02/01/23 0121  NA 142  --  143 139 135  K 4.1  --  4.1 4.0 3.9  CL 99  --  93* 90* 88*  CO2 32  --  35* 35* 38*  GLUCOSE 93  --  109* 118* 116*  BUN 17  --  17 19 17   CREATININE 1.13  --  1.07 1.33* 1.14  CALCIUM 9.2  --  9.2 8.7* 8.9  MG  --  2.0  --  1.8 1.8    Liver Function Tests: No results for input(s): "AST", "ALT", "ALKPHOS", "BILITOT", "PROT", "ALBUMIN" in the last 168 hours.   Radiology Studies: No results found.    LOS: 3 days     Joycelyn Das, MD [facility] Available via Epic secure chat 7am-7pm After these hours, please refer to coverage provider listed on [email or web address] 02/01/2023, 3:01 PM

## 2023-02-02 ENCOUNTER — Encounter (HOSPITAL_COMMUNITY)
Admission: EM | Disposition: A | Discharge status: Home / Self Care | Payer: Self-pay | Source: Home / Self Care | Attending: Internal Medicine

## 2023-02-02 DIAGNOSIS — I5031 Acute diastolic (congestive) heart failure: Secondary | ICD-10-CM | POA: Diagnosis not present

## 2023-02-02 DIAGNOSIS — I483 Typical atrial flutter: Secondary | ICD-10-CM

## 2023-02-02 DIAGNOSIS — I2510 Atherosclerotic heart disease of native coronary artery without angina pectoris: Secondary | ICD-10-CM | POA: Diagnosis not present

## 2023-02-02 DIAGNOSIS — F1011 Alcohol abuse, in remission: Secondary | ICD-10-CM | POA: Diagnosis not present

## 2023-02-02 DIAGNOSIS — E039 Hypothyroidism, unspecified: Secondary | ICD-10-CM | POA: Diagnosis not present

## 2023-02-02 DIAGNOSIS — I4892 Unspecified atrial flutter: Secondary | ICD-10-CM | POA: Diagnosis not present

## 2023-02-02 HISTORY — PX: RIGHT/LEFT HEART CATH AND CORONARY ANGIOGRAPHY: CATH118266

## 2023-02-02 LAB — POCT I-STAT 7, (LYTES, BLD GAS, ICA,H+H)
Acid-Base Excess: 12 mmol/L — ABNORMAL HIGH (ref 0.0–2.0)
Bicarbonate: 43.8 mmol/L — ABNORMAL HIGH (ref 20.0–28.0)
Calcium, Ion: 1.18 mmol/L (ref 1.15–1.40)
HCT: 50 % (ref 39.0–52.0)
Hemoglobin: 17 g/dL (ref 13.0–17.0)
O2 Saturation: 85 %
Potassium: 4.1 mmol/L (ref 3.5–5.1)
Sodium: 137 mmol/L (ref 135–145)
TCO2: 46 mmol/L — ABNORMAL HIGH (ref 22–32)
pCO2 arterial: 88 mmHg (ref 32–48)
pH, Arterial: 7.305 — ABNORMAL LOW (ref 7.35–7.45)
pO2, Arterial: 59 mmHg — ABNORMAL LOW (ref 83–108)

## 2023-02-02 LAB — POCT I-STAT EG7
Acid-Base Excess: 11 mmol/L — ABNORMAL HIGH (ref 0.0–2.0)
Acid-Base Excess: 12 mmol/L — ABNORMAL HIGH (ref 0.0–2.0)
Bicarbonate: 42.7 mmol/L — ABNORMAL HIGH (ref 20.0–28.0)
Bicarbonate: 43.3 mmol/L — ABNORMAL HIGH (ref 20.0–28.0)
Calcium, Ion: 1.17 mmol/L (ref 1.15–1.40)
Calcium, Ion: 1.17 mmol/L (ref 1.15–1.40)
HCT: 50 % (ref 39.0–52.0)
HCT: 50 % (ref 39.0–52.0)
Hemoglobin: 17 g/dL (ref 13.0–17.0)
Hemoglobin: 17 g/dL (ref 13.0–17.0)
O2 Saturation: 61 %
O2 Saturation: 62 %
Potassium: 4.1 mmol/L (ref 3.5–5.1)
Potassium: 4.1 mmol/L (ref 3.5–5.1)
Sodium: 136 mmol/L (ref 135–145)
Sodium: 137 mmol/L (ref 135–145)
TCO2: 45 mmol/L — ABNORMAL HIGH (ref 22–32)
TCO2: 46 mmol/L — ABNORMAL HIGH (ref 22–32)
pCO2, Ven: 85.4 mmHg (ref 44–60)
pCO2, Ven: 87.1 mmHg (ref 44–60)
pH, Ven: 7.304 (ref 7.25–7.43)
pH, Ven: 7.307 (ref 7.25–7.43)
pO2, Ven: 37 mmHg (ref 32–45)
pO2, Ven: 38 mmHg (ref 32–45)

## 2023-02-02 SURGERY — RIGHT/LEFT HEART CATH AND CORONARY ANGIOGRAPHY
Anesthesia: LOCAL

## 2023-02-02 MED ORDER — SODIUM CHLORIDE 0.9 % IV SOLN: INTRAVENOUS | Status: AC

## 2023-02-02 MED ORDER — VERAPAMIL HCL 2.5 MG/ML IV SOLN
INTRAVENOUS | Status: DC | PRN
  Administered 2023-02-02: 10 mL via INTRA_ARTERIAL

## 2023-02-02 MED ORDER — IOHEXOL 350 MG/ML SOLN
INTRAVENOUS | Status: DC | PRN
  Administered 2023-02-02: 41 mL

## 2023-02-02 MED ORDER — DIAZEPAM 5 MG PO TABS: 5.0000 mg | ORAL_TABLET | ORAL | Status: DC | PRN

## 2023-02-02 MED ORDER — HEPARIN SODIUM (PORCINE) 1000 UNIT/ML IJ SOLN
INTRAMUSCULAR | Status: DC | PRN
  Administered 2023-02-02: 6000 [IU] via INTRAVENOUS

## 2023-02-02 MED ORDER — FENTANYL CITRATE (PF) 100 MCG/2ML IJ SOLN
INTRAMUSCULAR | Status: DC | PRN
  Administered 2023-02-02 (×2): 25 ug via INTRAVENOUS

## 2023-02-02 MED ORDER — SODIUM CHLORIDE 0.9% FLUSH: 3.0000 mL | INTRAVENOUS | Status: DC | PRN

## 2023-02-02 MED ORDER — LIDOCAINE HCL (PF) 1 % IJ SOLN
INTRAMUSCULAR | Status: AC
  Filled 2023-02-02: qty 30

## 2023-02-02 MED ORDER — SODIUM CHLORIDE 0.9 % IV SOLN: 250.0000 mL | INTRAVENOUS | Status: DC | PRN

## 2023-02-02 MED ORDER — FENTANYL CITRATE (PF) 100 MCG/2ML IJ SOLN
INTRAMUSCULAR | Status: AC
  Filled 2023-02-02: qty 2

## 2023-02-02 MED ORDER — MIDAZOLAM HCL 2 MG/2ML IJ SOLN
INTRAMUSCULAR | Status: DC | PRN
  Administered 2023-02-02 (×2): 1 mg via INTRAVENOUS

## 2023-02-02 MED ORDER — HEPARIN (PORCINE) IN NACL 1000-0.9 UT/500ML-% IV SOLN
INTRAVENOUS | Status: DC | PRN
  Administered 2023-02-02 (×2): 500 mL

## 2023-02-02 MED ORDER — LIDOCAINE HCL (PF) 1 % IJ SOLN
INTRAMUSCULAR | Status: DC | PRN
  Administered 2023-02-02: 5 mL

## 2023-02-02 MED ORDER — APIXABAN 5 MG PO TABS
5.0000 mg | ORAL_TABLET | Freq: Two times a day (BID) | ORAL | Status: DC
  Administered 2023-02-02 – 2023-02-05 (×6): 5 mg via ORAL
  Filled 2023-02-02 (×6): qty 1

## 2023-02-02 MED ORDER — SODIUM CHLORIDE 0.9% FLUSH
3.0000 mL | Freq: Two times a day (BID) | INTRAVENOUS | Status: DC
  Administered 2023-02-02 – 2023-02-05 (×4): 3 mL via INTRAVENOUS

## 2023-02-02 MED ORDER — ACETAMINOPHEN 325 MG PO TABS: 650.0000 mg | ORAL_TABLET | ORAL | Status: DC | PRN

## 2023-02-02 MED ORDER — LABETALOL HCL 5 MG/ML IV SOLN: 10.0000 mg | INTRAVENOUS | Status: AC | PRN

## 2023-02-02 MED ORDER — MIDAZOLAM HCL 2 MG/2ML IJ SOLN
INTRAMUSCULAR | Status: AC
  Filled 2023-02-02: qty 2

## 2023-02-02 MED ORDER — HYDRALAZINE HCL 20 MG/ML IJ SOLN: 10.0000 mg | INTRAMUSCULAR | Status: AC | PRN

## 2023-02-02 MED ORDER — ONDANSETRON HCL 4 MG/2ML IJ SOLN: 4.0000 mg | Freq: Four times a day (QID) | INTRAMUSCULAR | Status: DC | PRN

## 2023-02-02 MED ORDER — ASPIRIN 81 MG PO CHEW: 81.0000 mg | CHEWABLE_TABLET | Freq: Every day | ORAL | Status: DC

## 2023-02-02 MED ORDER — ALUM & MAG HYDROXIDE-SIMETH 200-200-20 MG/5ML PO SUSP: 30.0000 mL | ORAL | Status: DC | PRN

## 2023-02-02 MED ORDER — HEPARIN SODIUM (PORCINE) 1000 UNIT/ML IJ SOLN
INTRAMUSCULAR | Status: AC
  Filled 2023-02-02: qty 10

## 2023-02-02 MED ORDER — VERAPAMIL HCL 2.5 MG/ML IV SOLN
INTRAVENOUS | Status: AC
  Filled 2023-02-02: qty 2

## 2023-02-02 SURGICAL SUPPLY — 17 items
CATH BALLN WEDGE 5F 110CM (CATHETERS) IMPLANT
CATH INFINITI AMBI 5FR TG (CATHETERS) IMPLANT
DEVICE RAD COMP TR BAND LRG (VASCULAR PRODUCTS) IMPLANT
ELECT DEFIB PAD ADLT CADENCE (PAD) IMPLANT
GLIDESHEATH SLEND SS 6F .021 (SHEATH) IMPLANT
GUIDEWIRE .025 260CM (WIRE) IMPLANT
GUIDEWIRE INQWIRE 1.5J.035X260 (WIRE) IMPLANT
INQWIRE 1.5J .035X260CM (WIRE) ×1
KIT HEART LEFT (KITS) ×1 IMPLANT
KIT SINGLE USE MANIFOLD (KITS) IMPLANT
KIT SYRINGE INJ CVI SPIKEX1 (MISCELLANEOUS) IMPLANT
PACK CARDIAC CATHETERIZATION (CUSTOM PROCEDURE TRAY) ×1 IMPLANT
SET ATX-X65L (MISCELLANEOUS) IMPLANT
SHEATH GLIDE SLENDER 4/5FR (SHEATH) IMPLANT
SHEATH PROBE COVER 6X72 (BAG) IMPLANT
TUBING CIL FLEX 10 FLL-RA (TUBING) ×1 IMPLANT
WIRE EMERALD 3MM-J .025X260CM (WIRE) IMPLANT

## 2023-02-02 NOTE — Interval H&P Note (Signed)
Cath Lab Visit (complete for each Cath Lab visit)  Clinical Evaluation Leading to the Procedure:   ACS: No.  Non-ACS:    Anginal Classification: CCS II  Anti-ischemic medical therapy: Minimal Therapy (1 class of medications)  Non-Invasive Test Results: No non-invasive testing performed  Prior CABG: No previous CABG      History and Physical Interval Note:  02/02/2023 7:51 AM  Ryan Hatfield  has presented today for surgery, with the diagnosis of chest pain - heart failure.  The various methods of treatment have been discussed with the patient and family. After consideration of risks, benefits and other options for treatment, the patient has consented to  Procedure(s): RIGHT/LEFT HEART CATH AND CORONARY ANGIOGRAPHY (N/A) as a surgical intervention.  The patient's history has been reviewed, patient examined, no change in status, stable for surgery.  I have reviewed the patient's chart and labs.  Questions were answered to the patient's satisfaction.     Nicki Guadalajara

## 2023-02-02 NOTE — Progress Notes (Signed)
PROGRESS NOTE    Ryan Hatfield Ke  ZOX:[id number] DOB: Sep 04, 1965 DOA: 01/29/2023 PCP: Mort Sawyers, FNP    Brief Narrative:   Ryan Hatfield is a 57 y.o. male with medical history significant of tobacco abuse, COPD, alcohol abuse in remission, morbid obesity, anxiety with depression, diastolic dysfunction CHF presented to the ER with significant shortness of breath, cough and palpitations for 3 days with some leg swelling.  Patient also had PND and orthopnea.  In the ED, patient was tachycardic with irregular rhythm and was noted to have atrial flutter with rapid ventricular response.  Pulse ox was 87% on room air.  Patient was then admitted hospital for further evaluation and treatment. Patient  Assessment and Plan:   Typical atrial flutter with rapid ventricular response:  New diagnosis of atrial flutter.  Initially was on Cardizem drip.  Remained in rapid response so metoprolol was initiated.  Electrophysiology cardiology on board .  2D echocardiogram from 01/30/2023 showed LV ejection fraction of 55 to 60% with indeterminate diastolic parameters.  Status postcardiac catheterization today with the insignificant stenosis of the RCA and mid LAD.  Recommended apixaban and antiplatelet therapy.  Findings were mild and small nonobstructive CAD.  TSH of 4.2.  Has been started on Eliquis.     Patient would likely benefit from assessment for sleep apnea as outpatient.  Awaiting cardioversion on Wednesday.   Acute on chronic diastolic heart failure:  Likely triggered by atrial fibrillation. 2D echocardiogram with preserved LV function. Continue aspirin, Lipitor, Lasix.   Strict intake and output charting, daily weights.  Status post cardiac catheterization on 02/02/2023 with findings of nonobstructive CAD.Marland Kitchen   COPD: Continue Xopenex.   Tobacco abuse:Continue  nicotine patch.   history of alcohol abuse: Patient currently in remission.  No signs of withdrawal.  History of hypertension.  Was on  losartan.  Currently on hold.  Continue metoprolol    morbid obesity: Body mass index is 41.18 kg/m.  Would benefit from weight loss as outpatient.      DVT prophylaxis: Heparin drip apixaban (ELIQUIS) tablet 5 mg   Code Status:     Code Status: Full Code  Disposition: Home likely in 2 to 3 days when okay with cardiology   Status is: Inpatient  Remains inpatient appropriate because: A-fib with RVR, congestive heart failure, status post cardiac catheterization, awaiting DC cardioversion.    Family Communication: Spoke with the patient's daughter and stabilized admitted  Consultants:  Cardiology  Procedures:  Right and left heart catheterization on 02/02/2023   Antimicrobials:  None  Anti-infectives (From admission, onward)    None       Subjective: Today, patient was seen and examined at bedside.  Seen after cardiac catheterization.  Denies any chest pain, nausea, vomiting,.  Feels fatigued and weak.  Objective: Vitals:   02/02/23 0925 02/02/23 1010 02/02/23 1055 02/02/23 1150  BP:    (!) 111/56  Pulse:      Resp: 18 18 18 16   Temp: 98 F (36.7 C) 97.7 F (36.5 C) 97.9 F (36.6 C) 97.9 F (36.6 C)  TempSrc:    Oral  SpO2:      Weight:      Height:        Intake/Output Summary (Last 24 hours) at 02/02/2023 1315 Last data filed at 02/02/2023 1143 Gross per 24 hour  Intake 1338.82 ml  Output 3825 ml  Net -2486.18 ml   Filed Weights   01/31/23 0438 02/01/23 0358 02/02/23 0356  Weight: 124.5  kg 125.8 kg 124.4 kg    Physical Examination: Body mass index is 40.49 kg/m.   General: Obese built, not in obvious distress, on nasal cannula oxygen, Communicative, mildly sleepy HENT:   No scleral pallor or icterus noted. Oral mucosa is moist.  Chest:   Diminished breath sounds bilaterally.   CVS: S1 &S2 heard. No murmur.  Irregularly irregular rhythm.   Abdomen: Soft, nontender, nondistended.  Bowel sounds are heard.   Extremities: No cyanosis, clubbing  with bilateral lower extremity edema++  Psych: Alert, awake and oriented, normal mood, mildly sleepy CNS:  No cranial nerve deficits.  Power equal in all extremities.   Skin: Warm and dry.  No rashes noted.  Data Reviewed:   CBC: Recent Labs  Lab 01/29/23 1426 01/30/23 0048 01/31/23 0823 02/01/23 0121 02/02/23 0205  WBC 11.1* 10.8* 12.0* 10.6* 9.5  HGB 16.0 16.5 15.3 15.7 15.7  HCT 51.4 50.7 51.1 51.3 52.4*  MCV 91.0 93.4 92.4 90.3 91.8  PLT 219 221 232 197 205    Basic Metabolic Panel: Recent Labs  Lab 01/29/23 1426 01/29/23 1615 01/30/23 0048 01/31/23 0823 02/01/23 0121 02/02/23 0205  NA 142  --  143 139 135 137  K 4.1  --  4.1 4.0 3.9 3.9  CL 99  --  93* 90* 88* 88*  CO2 32  --  35* 35* 38* 40*  GLUCOSE 93  --  109* 118* 116* 133*  BUN 17  --  17 19 17 17   CREATININE 1.13  --  1.07 1.33* 1.14 1.15  CALCIUM 9.2  --  9.2 8.7* 8.9 8.9  MG  --  2.0  --  1.8 1.8 1.8    Liver Function Tests: No results for input(s): "AST", "ALT", "ALKPHOS", "BILITOT", "PROT", "ALBUMIN" in the last 168 hours.   Radiology Studies: CARDIAC CATHETERIZATION  Result Date: 02/02/2023   Prox RCA to Mid RCA lesion is 40% stenosed.   Mid LAD lesion is 20% stenosed.   Recommend to resume Apixaban, at currently prescribed dose and frequency.   Recommend concurrent antiplatelet therapy of Aspirin 81 mg daily. Moderate elevation of right heart pressures with moderate pulmonary hypertension and mean PA pressure 39 mmHg. Mild smooth nonobstructive CAD. RECOMMENDATION: Patient was in atrial flutter variable rate during the procedure.  Plans will be for resumption of anticoagulation therapy with probable TEE guided cardioversion.  Patient has been evaluated by EP for potential future need for ablation.  Weight loss and smoking cessation is essential.      LOS: 4 days     Joycelyn Das, MD [facility] Available via Epic secure chat 7am-7pm After these hours, please refer to coverage  provider listed on [email or web address] 02/02/2023, 1:15 PM

## 2023-02-02 NOTE — H&P (View-Only) (Signed)
Rounding Note    Patient Name: Ryan Hatfield Date of Encounter: 02/02/2023  [facility] Cardiologist: Debbe Odea, MD   Subjective   Going for right/left cath   Inpatient Medications    Scheduled Meds:  [facility] Hold] aspirin EC  81 mg Oral Daily   [MAR Hold] atorvastatin  40 mg Oral Daily   [MAR Hold] folic acid  1 mg Oral Daily   [MAR Hold] furosemide  40 mg Intravenous Daily   [MAR Hold] levothyroxine  50 mcg Oral Q0600   [MAR Hold] metoprolol tartrate  50 mg Oral Q8H   [MAR Hold] nicotine  21 mg Transdermal Daily   [MAR Hold] umeclidinium bromide  1 puff Inhalation Daily   Continuous Infusions:  sodium chloride 10 mL/hr at 02/02/23 0557   heparin 1,850 Units/hr (02/01/23 1919)   PRN Meds: [MAR Hold] acetaminophen, [MAR Hold] alum & mag hydroxide-simeth, [MAR Hold] levalbuterol, [MAR Hold] ondansetron (ZOFRAN) IV, [MAR Hold] traZODone   Vital Signs    Vitals:   02/02/23 0012 02/02/23 0356 02/02/23 0548 02/02/23 0550  BP: (!) 141/88 123/69 125/81 125/81  Pulse: 99 100  93  Resp: 18 16    Temp: 98.5 F (36.9 C) 98.3 F (36.8 C)    TempSrc: Oral Oral    SpO2: 97% 98%    Weight:  124.4 kg    Height:        Intake/Output Summary (Last 24 hours) at 02/02/2023 0735 Last data filed at 02/02/2023 0404 Gross per 24 hour  Intake 1111 ml  Output 2500 ml  Net -1389 ml      02/02/2023    3:56 AM 02/01/2023    3:58 AM 01/31/2023    4:38 AM  Last 3 Weights  Weight (lbs) 274 lb 3.2 oz 277 lb 6.4 oz 274 lb 8 oz  Weight (kg) 124.376 kg 125.828 kg 124.512 kg      Telemetry    Atrial flutter-personally reviewed  ECG    None new  Physical Exam   GEN: Well nourished, well developed, in no acute distress  HEENT: normal  Neck: no JVD, carotid bruits, or masses Cardiac: irregular, tachycardic; no murmurs, rubs, or gallops,no edema  Respiratory:  clear to auscultation bilaterally, normal work of breathing GI: soft, nontender, nondistended, +  BS MS: no deformity or atrophy  Skin: warm and dry Neuro:  Strength and sensation are intact Psych: euthymic mood, full affect   Labs    High Sensitivity Troponin:   Recent Labs  Lab 01/29/23 1615 01/29/23 1929  TROPONINIHS 20* 19*     Chemistry Recent Labs  Lab 01/31/23 0823 02/01/23 0121 02/02/23 0205  NA 139 135 137  K 4.0 3.9 3.9  CL 90* 88* 88*  CO2 35* 38* 40*  GLUCOSE 118* 116* 133*  BUN 19 17 17   CREATININE 1.33* 1.14 1.15  CALCIUM 8.7* 8.9 8.9  MG 1.8 1.8 1.8  GFRNONAA >60 >60 >60  ANIONGAP 14 9 9     Lipids  Recent Labs  Lab 01/30/23 0048  CHOL 126  TRIG 96  HDL 28*  LDLCALC 79  CHOLHDL 4.5    Hematology Recent Labs  Lab 01/31/23 0823 02/01/23 0121 02/02/23 0205  WBC 12.0* 10.6* 9.5  RBC 5.53 5.68 5.71  HGB 15.3 15.7 15.7  HCT 51.1 51.3 52.4*  MCV 92.4 90.3 91.8  MCH 27.7 27.6 27.5  MCHC 29.9* 30.6 30.0  RDW 14.1 13.9 13.8  PLT 232 197 205   Thyroid  Recent Labs  Lab 01/29/23 1929  TSH 4.222    BNP Recent Labs  Lab 01/29/23 1422  BNP 123.1*    DDimer  Recent Labs  Lab 01/29/23 1615  DDIMER 1.17*     Radiology    No results found.  Cardiac Studies   TTE  1. LVEF is normal Cannot fully evaluate regional wall motion as  endocardium is not well seen. . Left ventricular ejection fraction, by  estimation, is 55 to 60%. The left ventricle has normal function. The left  ventricle has no regional wall motion  abnormalities. Left ventricular diastolic parameters are indeterminate.   2. Right ventricular systolic function is normal. The right ventricular  size is normal.   3. Trivial mitral valve regurgitation.   4. The aortic valve is tricuspid. Aortic valve regurgitation is not  visualized. Aortic valve sclerosis/calcification is present, without any  evidence of aortic stenosis.   5. The inferior vena cava is normal in size with greater than 50%  respiratory variability, suggesting right atrial pressure of 3 mmHg.    Patient Profile     57 y.o. male present to the hospital with chest pain and shortness of breath, found to be in rapid atrial flutter.  Assessment & Plan    1.  Typical atrial flutter: Likely the cause of the patient's symptoms.  He presented with significant tachycardia.  Currently on heparin.  Will likely need TEE/cardioversion this admission.  May need antiarrhythmics based on left heart catheterization post cardioversion.  Ablation would be a reasonable option.  He can follow-up as an outpatient.  2.  Acute diastolic heart failure: Likely due to rapid atrial flutter.  Net -3.9 L and feeling improved.  Continue diuresis.  3.  Chest pain: Has a long smoking history.  Had chest pain with rapid atrial flutter.  Current plan for left heart catheterization this am  The patient understands that risks include but are not limited to stroke (1 in 1000), death (1 in 1000), kidney failure [usually temporary] (1 in 500), bleeding (1 in 200), allergic reaction [possibly serious] (1 in 200), and agrees to proceed.     Will start eliquis post cath first dose tonight and consider TEE/DCC Wednesday schedule permitting   For questions or updates, please contact [facility] Please consult [email or web address] for contact info under        Signed, Charlton Haws, MD  02/02/2023, 7:35 AM

## 2023-02-02 NOTE — TOC CM/SW Note (Signed)
Transition of Care [facility]) - Inpatient Brief Assessment   Patient Details  Name: Ryan Hatfield MRN: [id number] Date of Birth: 1965-12-16  Transition of Care [facility]) CM/SW Contact:    Gala Lewandowsky, RN Phone Number: 02/02/2023, 12:14 PM   Clinical Narrative: Patient presented for palpitations-plan for [facility] today. Case Manager will continue to follow for transition of care needs.   Transition of Care Asessment: Insurance and Status: Insurance coverage has been reviewed Patient has primary care physician: Yes  Prior/Current Home Services: No current home services Social Determinants of Health Reivew: SDOH reviewed no interventions necessary Readmission risk has been reviewed: Yes Transition of care needs: no transition of care needs at this time

## 2023-02-02 NOTE — Progress Notes (Signed)
Rounding Note    Patient Name: Ryan Hatfield Date of Encounter: 02/02/2023  [facility] Cardiologist: Debbe Odea, MD   Subjective   Going for right/left cath   Inpatient Medications    Scheduled Meds:  [facility] Hold] aspirin EC  81 mg Oral Daily   [MAR Hold] atorvastatin  40 mg Oral Daily   [MAR Hold] folic acid  1 mg Oral Daily   [MAR Hold] furosemide  40 mg Intravenous Daily   [MAR Hold] levothyroxine  50 mcg Oral Q0600   [MAR Hold] metoprolol tartrate  50 mg Oral Q8H   [MAR Hold] nicotine  21 mg Transdermal Daily   [MAR Hold] umeclidinium bromide  1 puff Inhalation Daily   Continuous Infusions:  sodium chloride 10 mL/hr at 02/02/23 0557   heparin 1,850 Units/hr (02/01/23 1919)   PRN Meds: [MAR Hold] acetaminophen, [MAR Hold] alum & mag hydroxide-simeth, [MAR Hold] levalbuterol, [MAR Hold] ondansetron (ZOFRAN) IV, [MAR Hold] traZODone   Vital Signs    Vitals:   02/02/23 0012 02/02/23 0356 02/02/23 0548 02/02/23 0550  BP: (!) 141/88 123/69 125/81 125/81  Pulse: 99 100  93  Resp: 18 16    Temp: 98.5 F (36.9 C) 98.3 F (36.8 C)    TempSrc: Oral Oral    SpO2: 97% 98%    Weight:  124.4 kg    Height:        Intake/Output Summary (Last 24 hours) at 02/02/2023 0735 Last data filed at 02/02/2023 0404 Gross per 24 hour  Intake 1111 ml  Output 2500 ml  Net -1389 ml      02/02/2023    3:56 AM 02/01/2023    3:58 AM 01/31/2023    4:38 AM  Last 3 Weights  Weight (lbs) 274 lb 3.2 oz 277 lb 6.4 oz 274 lb 8 oz  Weight (kg) 124.376 kg 125.828 kg 124.512 kg      Telemetry    Atrial flutter-personally reviewed  ECG    None new  Physical Exam   GEN: Well nourished, well developed, in no acute distress  HEENT: normal  Neck: no JVD, carotid bruits, or masses Cardiac: irregular, tachycardic; no murmurs, rubs, or gallops,no edema  Respiratory:  clear to auscultation bilaterally, normal work of breathing GI: soft, nontender, nondistended, +  BS MS: no deformity or atrophy  Skin: warm and dry Neuro:  Strength and sensation are intact Psych: euthymic mood, full affect   Labs    High Sensitivity Troponin:   Recent Labs  Lab 01/29/23 1615 01/29/23 1929  TROPONINIHS 20* 19*     Chemistry Recent Labs  Lab 01/31/23 0823 02/01/23 0121 02/02/23 0205  NA 139 135 137  K 4.0 3.9 3.9  CL 90* 88* 88*  CO2 35* 38* 40*  GLUCOSE 118* 116* 133*  BUN 19 17 17   CREATININE 1.33* 1.14 1.15  CALCIUM 8.7* 8.9 8.9  MG 1.8 1.8 1.8  GFRNONAA >60 >60 >60  ANIONGAP 14 9 9     Lipids  Recent Labs  Lab 01/30/23 0048  CHOL 126  TRIG 96  HDL 28*  LDLCALC 79  CHOLHDL 4.5    Hematology Recent Labs  Lab 01/31/23 0823 02/01/23 0121 02/02/23 0205  WBC 12.0* 10.6* 9.5  RBC 5.53 5.68 5.71  HGB 15.3 15.7 15.7  HCT 51.1 51.3 52.4*  MCV 92.4 90.3 91.8  MCH 27.7 27.6 27.5  MCHC 29.9* 30.6 30.0  RDW 14.1 13.9 13.8  PLT 232 197 205   Thyroid  Recent Labs  Lab 01/29/23 1929  TSH 4.222    BNP Recent Labs  Lab 01/29/23 1422  BNP 123.1*    DDimer  Recent Labs  Lab 01/29/23 1615  DDIMER 1.17*     Radiology    No results found.  Cardiac Studies   TTE  1. LVEF is normal Cannot fully evaluate regional wall motion as  endocardium is not well seen. . Left ventricular ejection fraction, by  estimation, is 55 to 60%. The left ventricle has normal function. The left  ventricle has no regional wall motion  abnormalities. Left ventricular diastolic parameters are indeterminate.   2. Right ventricular systolic function is normal. The right ventricular  size is normal.   3. Trivial mitral valve regurgitation.   4. The aortic valve is tricuspid. Aortic valve regurgitation is not  visualized. Aortic valve sclerosis/calcification is present, without any  evidence of aortic stenosis.   5. The inferior vena cava is normal in size with greater than 50%  respiratory variability, suggesting right atrial pressure of 3 mmHg.    Patient Profile     57 y.o. male present to the hospital with chest pain and shortness of breath, found to be in rapid atrial flutter.  Assessment & Plan    1.  Typical atrial flutter: Likely the cause of the patient's symptoms.  He presented with significant tachycardia.  Currently on heparin.  Will likely need TEE/cardioversion this admission.  May need antiarrhythmics based on left heart catheterization post cardioversion.  Ablation would be a reasonable option.  He can follow-up as an outpatient.  2.  Acute diastolic heart failure: Likely due to rapid atrial flutter.  Net -3.9 L and feeling improved.  Continue diuresis.  3.  Chest pain: Has a long smoking history.  Had chest pain with rapid atrial flutter.  Current plan for left heart catheterization this am  The patient understands that risks include but are not limited to stroke (1 in 1000), death (1 in 1000), kidney failure [usually temporary] (1 in 500), bleeding (1 in 200), allergic reaction [possibly serious] (1 in 200), and agrees to proceed.     Will start eliquis post cath first dose tonight and consider TEE/DCC Wednesday schedule permitting   For questions or updates, please contact [facility] Please consult [email or web address] for contact info under        Signed, Charlton Haws, MD  02/02/2023, 7:35 AM

## 2023-02-02 NOTE — Plan of Care (Signed)

## 2023-02-02 NOTE — Progress Notes (Signed)
  TEE/DCCV for atrial flutter with RVR.  Informed Consent   Shared Decision Making/Informed Consent The risks [stroke, cardiac arrhythmias rarely resulting in the need for a temporary or permanent pacemaker, skin irritation or burns, esophageal damage, perforation (1:10,000 risk), bleeding, pharyngeal hematoma as well as other potential complications associated with conscious sedation including aspiration, arrhythmia, respiratory failure and death], benefits (treatment guidance, restoration of normal sinus rhythm, diagnostic support) and alternatives of a transesophageal echocardiogram guided cardioversion were discussed in detail with Ryan Hatfield and he is willing to proceed.      Perlie Gold, PA-C

## 2023-02-03 ENCOUNTER — Encounter (HOSPITAL_COMMUNITY): Payer: Self-pay | Admitting: Cardiovascular Disease

## 2023-02-03 DIAGNOSIS — I428 Other cardiomyopathies: Secondary | ICD-10-CM | POA: Diagnosis not present

## 2023-02-03 DIAGNOSIS — E039 Hypothyroidism, unspecified: Secondary | ICD-10-CM | POA: Diagnosis not present

## 2023-02-03 DIAGNOSIS — I5031 Acute diastolic (congestive) heart failure: Secondary | ICD-10-CM | POA: Diagnosis not present

## 2023-02-03 DIAGNOSIS — I4892 Unspecified atrial flutter: Secondary | ICD-10-CM | POA: Diagnosis not present

## 2023-02-03 DIAGNOSIS — I483 Typical atrial flutter: Secondary | ICD-10-CM | POA: Diagnosis not present

## 2023-02-03 DIAGNOSIS — F419 Anxiety disorder, unspecified: Secondary | ICD-10-CM | POA: Diagnosis not present

## 2023-02-03 MED ORDER — SODIUM CHLORIDE 0.9 % IV SOLN
INTRAVENOUS | Status: DC
  Administered 2023-02-04: 20 mL/h via INTRAVENOUS

## 2023-02-03 NOTE — Progress Notes (Signed)
Rounding Note    Patient Name: Ryan Hatfield Date of Encounter: 02/03/2023  [facility] Cardiologist: Debbe Odea, MD   Subjective   No complaints   Inpatient Medications    Scheduled Meds:  apixaban  5 mg Oral BID   aspirin EC  81 mg Oral Daily   atorvastatin  40 mg Oral Daily   folic acid  1 mg Oral Daily   furosemide  40 mg Intravenous Daily   levothyroxine  50 mcg Oral Q0600   metoprolol tartrate  50 mg Oral Q8H   nicotine  21 mg Transdermal Daily   sodium chloride flush  3 mL Intravenous Q12H   umeclidinium bromide  1 puff Inhalation Daily   Continuous Infusions:  sodium chloride     PRN Meds: sodium chloride, acetaminophen, alum & mag hydroxide-simeth, diazepam, levalbuterol, ondansetron (ZOFRAN) IV, sodium chloride flush, traZODone   Vital Signs    Vitals:   02/02/23 2043 02/03/23 0031 02/03/23 0554 02/03/23 0814  BP:  130/85 118/71 114/70  Pulse: (!) 108     Resp:  16 17 18   Temp:   98.1 F (36.7 C) 98.2 F (36.8 C)  TempSrc:  Oral Oral Oral  SpO2:      Weight:   121.9 kg   Height:        Intake/Output Summary (Last 24 hours) at 02/03/2023 0820 Last data filed at 02/02/2023 2048 Gross per 24 hour  Intake 720.02 ml  Output 3125 ml  Net -2404.98 ml      02/03/2023    5:54 AM 02/02/2023    3:56 AM 02/01/2023    3:58 AM  Last 3 Weights  Weight (lbs) 268 lb 12.8 oz 274 lb 3.2 oz 277 lb 6.4 oz  Weight (kg) 121.927 kg 124.376 kg 125.828 kg      Telemetry    Atrial flutter-personally reviewed  ECG    None new  Physical Exam   GEN: Well nourished, well developed, in no acute distress  HEENT: normal  Neck: no JVD, carotid bruits, or masses Cardiac: irregular, tachycardic; no murmurs, rubs, or gallops,no edema  Respiratory:  clear to auscultation bilaterally, normal work of breathing GI: soft, nontender, nondistended, + BS MS: no deformity or atrophy  Skin: warm and dry Neuro:  Strength and sensation are intact Psych:  euthymic mood, full affect  Right radial cath site A   Labs    High Sensitivity Troponin:   Recent Labs  Lab 01/29/23 1615 01/29/23 1929  TROPONINIHS 20* 19*     Chemistry Recent Labs  Lab 02/01/23 0121 02/02/23 0205 02/02/23 0822 02/02/23 0823 02/02/23 0831 02/03/23 0115  NA 135 137   < > 137 137 139  K 3.9 3.9   < > 4.1 4.1 3.6  CL 88* 88*  --   --   --  91*  CO2 38* 40*  --   --   --  38*  GLUCOSE 116* 133*  --   --   --  145*  BUN 17 17  --   --   --  15  CREATININE 1.14 1.15  --   --   --  1.05  CALCIUM 8.9 8.9  --   --   --  8.9  MG 1.8 1.8  --   --   --  1.9  GFRNONAA >60 >60  --   --   --  >60  ANIONGAP 9 9  --   --   --  10   < > = values in this interval not displayed.    Lipids  Recent Labs  Lab 01/30/23 0048  CHOL 126  TRIG 96  HDL 28*  LDLCALC 79  CHOLHDL 4.5    Hematology Recent Labs  Lab 02/01/23 0121 02/02/23 0205 02/02/23 0822 02/02/23 0823 02/02/23 0831 02/03/23 0115  WBC 10.6* 9.5  --   --   --  9.0  RBC 5.68 5.71  --   --   --  5.41  HGB 15.7 15.7   < > 17.0 17.0 15.5  HCT 51.3 52.4*   < > 50.0 50.0 50.2  MCV 90.3 91.8  --   --   --  92.8  MCH 27.6 27.5  --   --   --  28.7  MCHC 30.6 30.0  --   --   --  30.9  RDW 13.9 13.8  --   --   --  13.9  PLT 197 205  --   --   --  195   < > = values in this interval not displayed.   Thyroid  Recent Labs  Lab 01/29/23 1929  TSH 4.222    BNP Recent Labs  Lab 01/29/23 1422  BNP 123.1*    DDimer  Recent Labs  Lab 01/29/23 1615  DDIMER 1.17*     Radiology    CARDIAC CATHETERIZATION  Result Date: 02/02/2023   Prox RCA to Mid RCA lesion is 40% stenosed.   Mid LAD lesion is 20% stenosed.   Recommend to resume Apixaban, at currently prescribed dose and frequency.   Recommend concurrent antiplatelet therapy of Aspirin 81 mg daily. Moderate elevation of right heart pressures with moderate pulmonary hypertension and mean PA pressure 39 mmHg. Mild smooth nonobstructive CAD.  RECOMMENDATION: Patient was in atrial flutter variable rate during the procedure.  Plans will be for resumption of anticoagulation therapy with probable TEE guided cardioversion.  Patient has been evaluated by EP for potential future need for ablation.  Weight loss and smoking cessation is essential.    Cardiac Studies   TTE  1. LVEF is normal Cannot fully evaluate regional wall motion as  endocardium is not well seen. . Left ventricular ejection fraction, by  estimation, is 55 to 60%. The left ventricle has normal function. The left  ventricle has no regional wall motion  abnormalities. Left ventricular diastolic parameters are indeterminate.   2. Right ventricular systolic function is normal. The right ventricular  size is normal.   3. Trivial mitral valve regurgitation.   4. The aortic valve is tricuspid. Aortic valve regurgitation is not  visualized. Aortic valve sclerosis/calcification is present, without any  evidence of aortic stenosis.   5. The inferior vena cava is normal in size with greater than 50%  respiratory variability, suggesting right atrial pressure of 3 mmHg.   Patient Profile     57 y.o. male present to the hospital with chest pain and shortness of breath, found to be in rapid atrial flutter.  Assessment & Plan    1.  Typical atrial flutter: Likely the cause of the patient's symptoms.  He presented with significant tachycardia.  Back on eliquis post cath TEE/DCC for 8/1 will have 3 doses of eliquis by then Unable to do earlier due to CRNA shortage  2.  Acute diastolic heart failure: Likely due to rapid atrial flutter.  Net -3.9 L and feeling improved.  Continue diuresis.  3.  Chest pain: non cardiac cath 7/29 with  no obstructive Disease    For questions or updates, please contact [facility] Please consult [email or web address] for contact info under        Signed, Charlton Haws, MD  02/03/2023, 8:20 AM

## 2023-02-03 NOTE — Progress Notes (Signed)
PROGRESS NOTE    Ryan Hatfield  ZOX:[id number] DOB: 1965-11-01 DOA: 01/29/2023 PCP: Mort Sawyers, FNP    Brief Narrative:   Ryan Hatfield is a 57 y.o. male with medical history significant of tobacco abuse, COPD, alcohol abuse in remission, morbid obesity, anxiety with depression, diastolic dysfunction CHF presented to the ER with significant shortness of breath, cough and palpitations for 3 days with some leg swelling.  Patient also had PND and orthopnea.  In the ED, patient was tachycardic with irregular rhythm and was noted to have atrial flutter with rapid ventricular response.  Pulse ox was 87% on room air.  Patient was then admitted hospital for further evaluation and treatment.  During hospitalization, patient has been seen by cardiology and underwent cardiac catheterization without significant stenosis but awaiting for DC cardioversion likely 02/05/2023.Marland Kitchen  Assessment and Plan:   Typical atrial flutter with rapid ventricular response:  New diagnosis of atrial flutter.  Initially was on Cardizem drip.  Remained in rapid response so metoprolol was initiated.  Electrophysiology cardiology was consulted and plan for DC cardioversion 02/05/2023.  2D echocardiogram from 01/30/2023 showed LV ejection fraction of 55 to 60% with indeterminate diastolic parameters.  Status postcardiac catheterization on 02/02/2023 with the insignificant stenosis of the RCA and mid LAD.  Recommended apixaban and antiplatelet therapy.  Findings were mild and small nonobstructive CAD.  TSH of 4.2. On Eliquis.     Patient would likely benefit from assessment for sleep apnea as outpatient.  Awaiting cardioversion on 02/05/2023   Acute on chronic diastolic heart failure:  Likely triggered by atrial fibrillation. 2D echocardiogram with preserved LV function. Continue aspirin, Lipitor, Lasix.   Strict intake and output charting, daily weights.  Status post cardiac catheterization on 02/02/2023 with findings of  nonobstructive CAD.Marland Kitchen   COPD: Continue Xopenex.   Tobacco abuse:Continue  nicotine patch.   History of alcohol abuse: Patient currently in remission.  No signs of withdrawal.  History of hypertension.  Was on losartan.  Currently on hold.  Continue metoprolol    morbid obesity: Body mass index is 41.18 kg/m.  Would benefit from weight loss as outpatient.      DVT prophylaxis: Heparin drip apixaban (ELIQUIS) tablet 5 mg   Code Status:     Code Status: Full Code  Disposition: Home likely in 2 to 3 days when okay with cardiology   Status is: Inpatient  Remains inpatient appropriate because: A-fib with RVR, congestive heart failure, status post cardiac catheterization, awaiting DC cardioversion.    Family Communication:  Spoke with the patient's daughter on 7/29  Consultants:  Cardiology  Procedures:  Right and left heart catheterization on 02/02/2023   Antimicrobials:  None  Anti-infectives (From admission, onward)    None       Subjective: Today, the patient was seen and examined at bedside.  Denies any chest pain, has mild shortness of breath but no overt dyspnea.  No cough.  Feels fatigued  Objective: Vitals:   02/03/23 0031 02/03/23 0554 02/03/23 0814 02/03/23 0914  BP: 130/85 118/71 114/70   Pulse:    70  Resp: 16 17 18 18   Temp:  98.1 F (36.7 C) 98.2 F (36.8 C)   TempSrc: Oral Oral Oral   SpO2:    95%  Weight:  121.9 kg    Height:        Intake/Output Summary (Last 24 hours) at 02/03/2023 1329 Last data filed at 02/03/2023 1148 Gross per 24 hour  Intake 720.02 ml  Output 3425 ml  Net -2704.98 ml   Filed Weights   02/01/23 0358 02/02/23 0356 02/03/23 0554  Weight: 125.8 kg 124.4 kg 121.9 kg    Physical Examination: Body mass index is 39.69 kg/m.   General: Obese built, not in obvious distress, on nasal cannula oxygen 1 L/min HENT:   No scleral pallor or icterus noted. Oral mucosa is moist.  Chest:   Diminished breath sounds  bilaterally.   CVS: S1 &S2 heard. No murmur.  Irregularly irregular rhythm.   Abdomen: Soft, nontender, nondistended.  Bowel sounds are heard.   Extremities: No cyanosis,  bilateral lower extremity edema++  Psych: Alert, awake and oriented, normal mood CNS:  No cranial nerve deficits.  Power equal in all extremities.   Skin: Warm and dry.  No rashes noted.  Data Reviewed:   CBC: Recent Labs  Lab 01/30/23 0048 01/31/23 1610 02/01/23 0121 02/02/23 0205 02/02/23 9604 02/02/23 0823 02/02/23 0831 02/03/23 0115  WBC 10.8* 12.0* 10.6* 9.5  --   --   --  9.0  HGB 16.5 15.3 15.7 15.7 17.0 17.0 17.0 15.5  HCT 50.7 51.1 51.3 52.4* 50.0 50.0 50.0 50.2  MCV 93.4 92.4 90.3 91.8  --   --   --  92.8  PLT 221 232 197 205  --   --   --  195    Basic Metabolic Panel: Recent Labs  Lab 01/29/23 1615 01/30/23 0048 01/31/23 0823 02/01/23 0121 02/02/23 0205 02/02/23 0822 02/02/23 0823 02/02/23 0831 02/03/23 0115  NA  --  143 139 135 137 136 137 137 139  K  --  4.1 4.0 3.9 3.9 4.1 4.1 4.1 3.6  CL  --  93* 90* 88* 88*  --   --   --  91*  CO2  --  35* 35* 38* 40*  --   --   --  38*  GLUCOSE  --  109* 118* 116* 133*  --   --   --  145*  BUN  --  17 19 17 17   --   --   --  15  CREATININE  --  1.07 1.33* 1.14 1.15  --   --   --  1.05  CALCIUM  --  9.2 8.7* 8.9 8.9  --   --   --  8.9  MG 2.0  --  1.8 1.8 1.8  --   --   --  1.9    Liver Function Tests: No results for input(s): "AST", "ALT", "ALKPHOS", "BILITOT", "PROT", "ALBUMIN" in the last 168 hours.   Radiology Studies: CARDIAC CATHETERIZATION  Result Date: 02/02/2023   Prox RCA to Mid RCA lesion is 40% stenosed.   Mid LAD lesion is 20% stenosed.   Recommend to resume Apixaban, at currently prescribed dose and frequency.   Recommend concurrent antiplatelet therapy of Aspirin 81 mg daily. Moderate elevation of right heart pressures with moderate pulmonary hypertension and mean PA pressure 39 mmHg. Mild smooth nonobstructive CAD.  RECOMMENDATION: Patient was in atrial flutter variable rate during the procedure.  Plans will be for resumption of anticoagulation therapy with probable TEE guided cardioversion.  Patient has been evaluated by EP for potential future need for ablation.  Weight loss and smoking cessation is essential.      LOS: 5 days     Joycelyn Das, MD [facility] Available via Epic secure chat 7am-7pm After these hours, please refer to coverage provider listed on [email or web address] 02/03/2023, 1:29 PM

## 2023-02-03 NOTE — Plan of Care (Signed)

## 2023-02-04 ENCOUNTER — Inpatient Hospital Stay (HOSPITAL_COMMUNITY): Payer: No Typology Code available for payment source

## 2023-02-04 ENCOUNTER — Encounter (HOSPITAL_COMMUNITY)
Admission: EM | Disposition: A | Discharge status: Home / Self Care | Payer: Self-pay | Source: Home / Self Care | Attending: Internal Medicine

## 2023-02-04 ENCOUNTER — Inpatient Hospital Stay (HOSPITAL_COMMUNITY): Payer: No Typology Code available for payment source | Admitting: Anesthesiology

## 2023-02-04 DIAGNOSIS — F17210 Nicotine dependence, cigarettes, uncomplicated: Secondary | ICD-10-CM

## 2023-02-04 DIAGNOSIS — I483 Typical atrial flutter: Secondary | ICD-10-CM | POA: Diagnosis not present

## 2023-02-04 DIAGNOSIS — E039 Hypothyroidism, unspecified: Secondary | ICD-10-CM | POA: Diagnosis not present

## 2023-02-04 DIAGNOSIS — I4892 Unspecified atrial flutter: Secondary | ICD-10-CM

## 2023-02-04 DIAGNOSIS — I4891 Unspecified atrial fibrillation: Secondary | ICD-10-CM

## 2023-02-04 DIAGNOSIS — I5031 Acute diastolic (congestive) heart failure: Secondary | ICD-10-CM | POA: Diagnosis not present

## 2023-02-04 DIAGNOSIS — I340 Nonrheumatic mitral (valve) insufficiency: Secondary | ICD-10-CM | POA: Diagnosis not present

## 2023-02-04 DIAGNOSIS — I110 Hypertensive heart disease with heart failure: Secondary | ICD-10-CM

## 2023-02-04 DIAGNOSIS — F1011 Alcohol abuse, in remission: Secondary | ICD-10-CM | POA: Diagnosis not present

## 2023-02-04 HISTORY — PX: CARDIOVERSION: SHX1299

## 2023-02-04 HISTORY — PX: TEE WITHOUT CARDIOVERSION: SHX5443

## 2023-02-04 SURGERY — ECHOCARDIOGRAM, TRANSESOPHAGEAL
Anesthesia: General

## 2023-02-04 MED ORDER — LIDOCAINE 2% (20 MG/ML) 5 ML SYRINGE
INTRAMUSCULAR | Status: DC | PRN
  Administered 2023-02-04: 100 mg/kg/h via INTRAVENOUS

## 2023-02-04 MED ORDER — PHENYLEPHRINE 80 MCG/ML (10ML) SYRINGE FOR IV PUSH (FOR BLOOD PRESSURE SUPPORT)
PREFILLED_SYRINGE | INTRAVENOUS | Status: DC | PRN
  Administered 2023-02-04: 160 ug via INTRAVENOUS

## 2023-02-04 MED ORDER — PROPOFOL 500 MG/50ML IV EMUL
INTRAVENOUS | Status: DC | PRN
  Administered 2023-02-04: 100 ug/kg/min via INTRAVENOUS

## 2023-02-04 SURGICAL SUPPLY — 1 items: ELECT DEFIB PAD ADLT CADENCE (PAD) ×1 IMPLANT

## 2023-02-04 NOTE — H&P (View-Only) (Signed)
Rounding Note    Patient Name: Ryan Hatfield Date of Encounter: 02/04/2023  [facility] Cardiologist: Debbe Odea, MD   Subjective   No complaints NPO able to do TEE/DCC today  Inpatient Medications    Scheduled Meds:  [MAR Hold] apixaban  5 mg Oral BID   [MAR Hold] aspirin EC  81 mg Oral Daily   [MAR Hold] atorvastatin  40 mg Oral Daily   [MAR Hold] folic acid  1 mg Oral Daily   [MAR Hold] furosemide  40 mg Intravenous Daily   [MAR Hold] levothyroxine  50 mcg Oral Q0600   [MAR Hold] metoprolol tartrate  50 mg Oral Q8H   [MAR Hold] nicotine  21 mg Transdermal Daily   [MAR Hold] sodium chloride flush  3 mL Intravenous Q12H   [MAR Hold] umeclidinium bromide  1 puff Inhalation Daily   Continuous Infusions:  [MAR Hold] sodium chloride     sodium chloride 20 mL/hr (02/04/23 0920)   PRN Meds: [MAR Hold] sodium chloride, [MAR Hold] acetaminophen, [MAR Hold] alum & mag hydroxide-simeth, [MAR Hold] diazepam, [MAR Hold] levalbuterol, [MAR Hold] ondansetron (ZOFRAN) IV, [MAR Hold] sodium chloride flush, [MAR Hold] traZODone   Vital Signs    Vitals:   02/03/23 1951 02/04/23 0507 02/04/23 0835 02/04/23 0900  BP: 111/68 130/81  117/74  Pulse: (!) 101 (!) 101  87  Resp: 18 17  16   Temp: 97.8 F (36.6 C) 98.4 F (36.9 C)  98.1 F (36.7 C)  TempSrc: Oral Oral    SpO2: 96% 95% 98% 96%  Weight:  120.3 kg    Height:        Intake/Output Summary (Last 24 hours) at 02/04/2023 0943 Last data filed at 02/04/2023 0700 Gross per 24 hour  Intake 240 ml  Output 2475 ml  Net -2235 ml      02/04/2023    5:07 AM 02/03/2023    5:54 AM 02/02/2023    3:56 AM  Last 3 Weights  Weight (lbs) 265 lb 4.8 oz 268 lb 12.8 oz 274 lb 3.2 oz  Weight (kg) 120.339 kg 121.927 kg 124.376 kg      Telemetry    Atrial flutter-personally reviewed  ECG    None new  Physical Exam   GEN: Well nourished, well developed, in no acute distress  HEENT: normal  Neck: no JVD,  carotid bruits, or masses Cardiac: irregular, tachycardic; no murmurs, rubs, or gallops,no edema  Respiratory:  clear to auscultation bilaterally, normal work of breathing GI: soft, nontender, nondistended, + BS MS: no deformity or atrophy  Skin: warm and dry Neuro:  Strength and sensation are intact Psych: euthymic mood, full affect  Right radial cath site A   Labs    High Sensitivity Troponin:   Recent Labs  Lab 01/29/23 1615 01/29/23 1929  TROPONINIHS 20* 19*     Chemistry Recent Labs  Lab 02/01/23 0121 02/02/23 0205 02/02/23 0822 02/02/23 0823 02/02/23 0831 02/03/23 0115  NA 135 137   < > 137 137 139  K 3.9 3.9   < > 4.1 4.1 3.6  CL 88* 88*  --   --   --  91*  CO2 38* 40*  --   --   --  38*  GLUCOSE 116* 133*  --   --   --  145*  BUN 17 17  --   --   --  15  CREATININE 1.14 1.15  --   --   --  1.05  CALCIUM 8.9 8.9  --   --   --  8.9  MG 1.8 1.8  --   --   --  1.9  GFRNONAA >60 >60  --   --   --  >60  ANIONGAP 9 9  --   --   --  10   < > = values in this interval not displayed.    Lipids  Recent Labs  Lab 01/30/23 0048  CHOL 126  TRIG 96  HDL 28*  LDLCALC 79  CHOLHDL 4.5    Hematology Recent Labs  Lab 02/01/23 0121 02/02/23 0205 02/02/23 0822 02/02/23 0823 02/02/23 0831 02/03/23 0115  WBC 10.6* 9.5  --   --   --  9.0  RBC 5.68 5.71  --   --   --  5.41  HGB 15.7 15.7   < > 17.0 17.0 15.5  HCT 51.3 52.4*   < > 50.0 50.0 50.2  MCV 90.3 91.8  --   --   --  92.8  MCH 27.6 27.5  --   --   --  28.7  MCHC 30.6 30.0  --   --   --  30.9  RDW 13.9 13.8  --   --   --  13.9  PLT 197 205  --   --   --  195   < > = values in this interval not displayed.   Thyroid  Recent Labs  Lab 01/29/23 1929  TSH 4.222    BNP Recent Labs  Lab 01/29/23 1422  BNP 123.1*    DDimer  Recent Labs  Lab 01/29/23 1615  DDIMER 1.17*     Radiology    No results found.  Cardiac Studies   TTE  1. LVEF is normal Cannot fully evaluate regional wall motion as   endocardium is not well seen. . Left ventricular ejection fraction, by  estimation, is 55 to 60%. The left ventricle has normal function. The left  ventricle has no regional wall motion  abnormalities. Left ventricular diastolic parameters are indeterminate.   2. Right ventricular systolic function is normal. The right ventricular  size is normal.   3. Trivial mitral valve regurgitation.   4. The aortic valve is tricuspid. Aortic valve regurgitation is not  visualized. Aortic valve sclerosis/calcification is present, without any  evidence of aortic stenosis.   5. The inferior vena cava is normal in size with greater than 50%  respiratory variability, suggesting right atrial pressure of 3 mmHg.   Patient Profile     57 y.o. male present to the hospital with chest pain and shortness of breath, found to be in rapid atrial flutter.  Assessment & Plan    1.  Typical atrial flutter: Likely the cause of the patient's symptoms.  He presented with significant tachycardia.  Back on eliquis post cath TEE/DCC today on eliquis  2.  Acute diastolic heart failure: Likely due to rapid atrial flutter.  Net -3.9 L and feeling improved.  Continue diuresis.  3.  Chest pain: non cardiac cath 7/29 with no obstructive Disease    For questions or updates, please contact [facility] Please consult [email or web address] for contact info under        Signed, Charlton Haws, MD  02/04/2023, 9:43 AM

## 2023-02-04 NOTE — Progress Notes (Signed)
Rounding Note    Patient Name: Ryan Hatfield Date of Encounter: 02/04/2023  [facility] Cardiologist: Debbe Odea, MD   Subjective   No complaints NPO able to do TEE/DCC today  Inpatient Medications    Scheduled Meds:  [MAR Hold] apixaban  5 mg Oral BID   [MAR Hold] aspirin EC  81 mg Oral Daily   [MAR Hold] atorvastatin  40 mg Oral Daily   [MAR Hold] folic acid  1 mg Oral Daily   [MAR Hold] furosemide  40 mg Intravenous Daily   [MAR Hold] levothyroxine  50 mcg Oral Q0600   [MAR Hold] metoprolol tartrate  50 mg Oral Q8H   [MAR Hold] nicotine  21 mg Transdermal Daily   [MAR Hold] sodium chloride flush  3 mL Intravenous Q12H   [MAR Hold] umeclidinium bromide  1 puff Inhalation Daily   Continuous Infusions:  [MAR Hold] sodium chloride     sodium chloride 20 mL/hr (02/04/23 0920)   PRN Meds: [MAR Hold] sodium chloride, [MAR Hold] acetaminophen, [MAR Hold] alum & mag hydroxide-simeth, [MAR Hold] diazepam, [MAR Hold] levalbuterol, [MAR Hold] ondansetron (ZOFRAN) IV, [MAR Hold] sodium chloride flush, [MAR Hold] traZODone   Vital Signs    Vitals:   02/03/23 1951 02/04/23 0507 02/04/23 0835 02/04/23 0900  BP: 111/68 130/81  117/74  Pulse: (!) 101 (!) 101  87  Resp: 18 17  16   Temp: 97.8 F (36.6 C) 98.4 F (36.9 C)  98.1 F (36.7 C)  TempSrc: Oral Oral    SpO2: 96% 95% 98% 96%  Weight:  120.3 kg    Height:        Intake/Output Summary (Last 24 hours) at 02/04/2023 0943 Last data filed at 02/04/2023 0700 Gross per 24 hour  Intake 240 ml  Output 2475 ml  Net -2235 ml      02/04/2023    5:07 AM 02/03/2023    5:54 AM 02/02/2023    3:56 AM  Last 3 Weights  Weight (lbs) 265 lb 4.8 oz 268 lb 12.8 oz 274 lb 3.2 oz  Weight (kg) 120.339 kg 121.927 kg 124.376 kg      Telemetry    Atrial flutter-personally reviewed  ECG    None new  Physical Exam   GEN: Well nourished, well developed, in no acute distress  HEENT: normal  Neck: no JVD,  carotid bruits, or masses Cardiac: irregular, tachycardic; no murmurs, rubs, or gallops,no edema  Respiratory:  clear to auscultation bilaterally, normal work of breathing GI: soft, nontender, nondistended, + BS MS: no deformity or atrophy  Skin: warm and dry Neuro:  Strength and sensation are intact Psych: euthymic mood, full affect  Right radial cath site A   Labs    High Sensitivity Troponin:   Recent Labs  Lab 01/29/23 1615 01/29/23 1929  TROPONINIHS 20* 19*     Chemistry Recent Labs  Lab 02/01/23 0121 02/02/23 0205 02/02/23 0822 02/02/23 0823 02/02/23 0831 02/03/23 0115  NA 135 137   < > 137 137 139  K 3.9 3.9   < > 4.1 4.1 3.6  CL 88* 88*  --   --   --  91*  CO2 38* 40*  --   --   --  38*  GLUCOSE 116* 133*  --   --   --  145*  BUN 17 17  --   --   --  15  CREATININE 1.14 1.15  --   --   --  1.05  CALCIUM 8.9 8.9  --   --   --  8.9  MG 1.8 1.8  --   --   --  1.9  GFRNONAA >60 >60  --   --   --  >60  ANIONGAP 9 9  --   --   --  10   < > = values in this interval not displayed.    Lipids  Recent Labs  Lab 01/30/23 0048  CHOL 126  TRIG 96  HDL 28*  LDLCALC 79  CHOLHDL 4.5    Hematology Recent Labs  Lab 02/01/23 0121 02/02/23 0205 02/02/23 0822 02/02/23 0823 02/02/23 0831 02/03/23 0115  WBC 10.6* 9.5  --   --   --  9.0  RBC 5.68 5.71  --   --   --  5.41  HGB 15.7 15.7   < > 17.0 17.0 15.5  HCT 51.3 52.4*   < > 50.0 50.0 50.2  MCV 90.3 91.8  --   --   --  92.8  MCH 27.6 27.5  --   --   --  28.7  MCHC 30.6 30.0  --   --   --  30.9  RDW 13.9 13.8  --   --   --  13.9  PLT 197 205  --   --   --  195   < > = values in this interval not displayed.   Thyroid  Recent Labs  Lab 01/29/23 1929  TSH 4.222    BNP Recent Labs  Lab 01/29/23 1422  BNP 123.1*    DDimer  Recent Labs  Lab 01/29/23 1615  DDIMER 1.17*     Radiology    No results found.  Cardiac Studies   TTE  1. LVEF is normal Cannot fully evaluate regional wall motion as   endocardium is not well seen. . Left ventricular ejection fraction, by  estimation, is 55 to 60%. The left ventricle has normal function. The left  ventricle has no regional wall motion  abnormalities. Left ventricular diastolic parameters are indeterminate.   2. Right ventricular systolic function is normal. The right ventricular  size is normal.   3. Trivial mitral valve regurgitation.   4. The aortic valve is tricuspid. Aortic valve regurgitation is not  visualized. Aortic valve sclerosis/calcification is present, without any  evidence of aortic stenosis.   5. The inferior vena cava is normal in size with greater than 50%  respiratory variability, suggesting right atrial pressure of 3 mmHg.   Patient Profile     57 y.o. male present to the hospital with chest pain and shortness of breath, found to be in rapid atrial flutter.  Assessment & Plan    1.  Typical atrial flutter: Likely the cause of the patient's symptoms.  He presented with significant tachycardia.  Back on eliquis post cath TEE/DCC today on eliquis  2.  Acute diastolic heart failure: Likely due to rapid atrial flutter.  Net -3.9 L and feeling improved.  Continue diuresis.  3.  Chest pain: non cardiac cath 7/29 with no obstructive Disease    For questions or updates, please contact [facility] Please consult [email or web address] for contact info under        Signed, Charlton Haws, MD  02/04/2023, 9:43 AM

## 2023-02-04 NOTE — Transfer of Care (Signed)
Immediate Anesthesia Transfer of Care Note  Patient: Ryan Hatfield  Procedure(s) Performed: TRANSESOPHAGEAL ECHOCARDIOGRAM CARDIOVERSION  Patient Location: PACU and Cath Lab  Anesthesia Type:MAC  Level of Consciousness: awake and sedated  Airway & Oxygen Therapy: Patient connected to nasal cannula oxygen  Post-op Assessment: Report given to RN  Post vital signs: stable  Last Vitals:  Vitals Value Taken Time  BP    Temp    Pulse    Resp    SpO2      Last Pain:  Vitals:   02/04/23 0507  TempSrc: Oral  PainSc:          Complications: No notable events documented.

## 2023-02-04 NOTE — Anesthesia Postprocedure Evaluation (Signed)
Anesthesia Post Note  Patient: Ryan Hatfield  Procedure(s) Performed: TRANSESOPHAGEAL ECHOCARDIOGRAM CARDIOVERSION     Patient location during evaluation: Endoscopy Anesthesia Type: General Level of consciousness: awake and alert Pain management: pain level controlled Vital Signs Assessment: post-procedure vital signs reviewed and stable Respiratory status: spontaneous breathing, nonlabored ventilation, respiratory function stable and patient connected to nasal cannula oxygen Cardiovascular status: blood pressure returned to baseline and stable Postop Assessment: no apparent nausea or vomiting Anesthetic complications: no   No notable events documented.  Last Vitals:  Vitals:   02/04/23 1128 02/04/23 1143  BP: 116/80 129/85  Pulse: 78 89  Resp: 20   Temp:  36.4 C  SpO2: 95% 99%    Last Pain:  Vitals:   02/04/23 1143  TempSrc: Oral  PainSc:                  Collene Schlichter

## 2023-02-04 NOTE — Interval H&P Note (Signed)
History and Physical Interval Note:  02/04/2023 10:18 AM  Ryan Hatfield  has presented today for surgery, with the diagnosis of afib.  The various methods of treatment have been discussed with the patient and family. After consideration of risks, benefits and other options for treatment, the patient has consented to  Procedure(s): TRANSESOPHAGEAL ECHOCARDIOGRAM (N/A) CARDIOVERSION (N/A) as a surgical intervention.  The patient's history has been reviewed, patient examined, no change in status, stable for surgery.  I have reviewed the patient's chart and labs.  Questions were answered to the patient's satisfaction.     Little Ishikawa

## 2023-02-04 NOTE — CV Procedure (Signed)
   TRANSESOPHAGEAL ECHOCARDIOGRAM GUIDED DIRECT CURRENT CARDIOVERSION  NAME:  Ryan Hatfield   MRN: [id number] DOB:  1966/06/12   ADMIT DATE: 01/29/2023  INDICATIONS: Symptomatic atrial flutter  PROCEDURE:   Informed consent was obtained prior to the procedure. The risks, benefits and alternatives for the procedure were discussed and the patient comprehended these risks.  Risks include, but are not limited to, cough, sore throat, vomiting, nausea, somnolence, esophageal and stomach trauma or perforation, bleeding, low blood pressure, aspiration, pneumonia, infection, trauma to the teeth and death.    After a procedural time-out, the oropharynx was anesthetized and the patient was sedated by the anesthesia service. The transesophageal probe was inserted in the esophagus and stomach without difficulty and multiple views were obtained. Anesthesia was monitored by Dr. Desmond Lope and Ginette Otto, CRNA.   COMPLICATIONS:    Complications: Patient developed hypoxia during procedure and TEE was aborted.  After probe was removed, continued to desat with SpO2 falling to 40s.  Oral airway was placed and patient received bag mask ventilation with improvement in SpO2 to 90s.  FINDINGS:  No LAA thrombus   CARDIOVERSION:     Indications:  Symptomatic Atrial Fibrillation  Procedure Details:  Once the TEE was complete, the patient had the defibrillator pads placed in the anterior and posterior position. Once an appropriate level of sedation was confirmed, the patient was cardioverted x 1 with 200J of biphasic synchronized energy.  The patient converted to NSR.  There were no apparent complications.  The patient had normal neuro status and respiratory status post procedure with vitals stable as recorded elsewhere.  Adequate airway was maintained throughout and vital signs monitored per protocol.  Epifanio Lesches MD [facility] [place] [facility]  [address], [address] [place], [place] [address] [phone]   10:56 AM

## 2023-02-04 NOTE — Progress Notes (Signed)
  Echocardiogram Echocardiogram Transesophageal has been performed.  Milda Smart 02/04/2023, 10:56 AM

## 2023-02-04 NOTE — Evaluation (Signed)
Physical Therapy Evaluation Patient Details Name: Ryan Hatfield MRN: [id number] DOB: 1966/02/17 Today's Date: 02/04/2023  History of Present Illness  Pt is a 57 y/o male presenting to the ED with significant SOB, cough and palpitations.  Pt in A flutter with sats at 87% on RA.  PMH, tobacco abuse, anxiety with depression, d CHF, alcohol abuse in remission.  Clinical Impression  Pt admitted with/for SOB/aflutter, post cardioversion.  Pt has felt better after after his paracentesis.  Approaching recent baseline, but still not feeling well.  Pt currently limited functionally due to the problems listed below.  (see problems list.)  Pt will benefit from PT to maximize function and safety to be able to get home safely with limited available assist.         If plan is discharge home, recommend the following:     Can travel by private vehicle        Equipment Recommendations None recommended by PT  Recommendations for Other Services       Functional Status Assessment Patient has had a recent decline in their functional status and demonstrates the ability to make significant improvements in function in a reasonable and predictable amount of time.     Precautions / Restrictions Precautions Precautions: None Precaution Comments: watch for O2 needs      Mobility  Bed Mobility Overal bed mobility: Modified Independent                  Transfers Overall transfer level: Modified independent                      Ambulation/Gait Ambulation/Gait assistance: Supervision Gait Distance (Feet): 400 Feet (with 2 standing rests) Assistive device: None Gait Pattern/deviations: Step-through pattern Gait velocity: decreased Gait velocity interpretation: 1.31 - 2.62 ft/sec, indicative of limited community ambulator   General Gait Details: slow, but steady.  able to increase speed, but not significantly.  Sats on 2L >96% and ra >93%.  Pt reports noticeable difference with  O2.  Stairs Stairs: Yes Stairs assistance: Modified independent (Device/Increase time) Stair Management: One rail Left, Alternating pattern, Forwards Number of Stairs: 3 General stair comments: safe with the rail  Wheelchair Mobility     Tilt Bed    Modified Rankin (Stroke Patients Only)       Balance Overall balance assessment: No apparent balance deficits (not formally assessed)                                           Pertinent Vitals/Pain Pain Assessment Pain Assessment: Faces Faces Pain Scale: Hurts a little bit Pain Location: back (bad back, but this is likely from being in the bed) Pain Descriptors / Indicators: Sore Pain Intervention(s): Monitored during session    Home Living Family/patient expects to be discharged to:: Private residence Living Arrangements: Other (Comment) (lives with his brother) Available Help at Discharge: Family;Available PRN/intermittently (dtr/son) Type of Home: Mobile home Home Access: Ramped entrance;Stairs to enter Entrance Stairs-Rails: Right;Left Entrance Stairs-Number of Steps: several   Home Layout: One level Home Equipment: None      Prior Function Prior Level of Function : Independent/Modified Independent             Mobility Comments: pt reports independent.  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  Reports it seems "I've been out more than I've worked." ADLs Comments: Independent with ADL's  Hand Dominance        Extremity/Trunk Assessment   Upper Extremity Assessment Upper Extremity Assessment: Overall WFL for tasks assessed    Lower Extremity Assessment Lower Extremity Assessment: Overall WFL for tasks assessed    Cervical / Trunk Assessment Cervical / Trunk Assessment: Normal  Communication   Communication: No difficulties  Cognition Arousal/Alertness: Awake/alert Behavior During Therapy: WFL for tasks assessed/performed Overall Cognitive Status: Within Functional Limits for tasks  assessed                                          General Comments      Exercises     Assessment/Plan    PT Assessment Patient needs continued PT services  PT Problem List Decreased activity tolerance;Decreased mobility;Cardiopulmonary status limiting activity       PT Treatment Interventions Gait training;Functional mobility training;Therapeutic activities;Patient/family education    PT Goals (Current goals can be found in the Care Plan section)  Acute Rehab PT Goals Patient Stated Goal: feel better and be able to work PT Goal Formulation: With patient Time For Goal Achievement: 02/06/23 Potential to Achieve Goals: Good    Frequency Min 2X/week     Co-evaluation               AM-PAC PT "6 Clicks" Mobility  Outcome Measure Help needed turning from your back to your side while in a flat bed without using bedrails?: None Help needed moving from lying on your back to sitting on the side of a flat bed without using bedrails?: None Help needed moving to and from a bed to a chair (including a wheelchair)?: None Help needed standing up from a chair using your arms (e.g., wheelchair or bedside chair)?: None Help needed to walk in hospital room?: None Help needed climbing 3-5 steps with a railing? : None 6 Click Score: 24    End of Session Equipment Utilized During Treatment: Oxygen Activity Tolerance: Patient tolerated treatment well;Patient limited by fatigue Patient left: in bed;with call bell/phone within reach;Other (comment) (sitting EOB) Nurse Communication: Mobility status PT Visit Diagnosis: Difficulty in walking, not elsewhere classified (R26.2)    Time: 2956-2130 PT Time Calculation (min) (ACUTE ONLY): 22 min   Charges:   PT Evaluation $PT Eval Moderate Complexity: 1 Mod   PT General Charges $$ ACUTE PT VISIT: 1 Visit         02/04/2023  Ryan Hatfield., PT Acute Rehabilitation Services [phone]  (office)  Ryan Hatfield  Ryan Hatfield 02/04/2023, 6:38 PM

## 2023-02-04 NOTE — Plan of Care (Signed)
  Problem: Clinical Measurements: Goal: Respiratory complications will improve Outcome: Progressing   Problem: Activity: Goal: Risk for activity intolerance will decrease Outcome: Progressing   Problem: Nutrition: Goal: Adequate nutrition will be maintained Outcome: Progressing   Problem: Safety: Goal: Ability to remain free from injury will improve Outcome: Progressing   Problem: Education: Goal: Understanding of CV disease, CV risk reduction, and recovery process will improve Outcome: Progressing   Problem: Activity: Goal: Ability to return to baseline activity level will improve Outcome: Progressing   Problem: Cardiovascular: Goal: Ability to achieve and maintain adequate cardiovascular perfusion will improve Outcome: Progressing

## 2023-02-04 NOTE — Progress Notes (Addendum)
PROGRESS NOTE    Ryan Hatfield  QMV:[id number] DOB: 1966/06/28 DOA: 01/29/2023 PCP: Mort Sawyers, FNP    Brief Narrative:   Ryan Hatfield is a 57 y.o. male with medical history significant of tobacco abuse, COPD, alcohol abuse in remission, morbid obesity, anxiety with depression, diastolic dysfunction CHF presented to the ER with significant shortness of breath, cough and palpitations for 3 days with some leg swelling.  Patient also had PND and orthopnea.  In the ED, patient was tachycardic with irregular rhythm and was noted to have atrial flutter with rapid ventricular response.  Pulse ox was 87% on room air.  Patient was then admitted hospital for further evaluation and treatment.  During hospitalization, patient has been seen by cardiology and underwent cardiac catheterization without significant stenosis.  Patient then underwent  DC cardioversion on 02/05/2023 with conversion to normal sinus rhythm..  Assessment and Plan:   Typical atrial flutter with rapid ventricular response:  New diagnosis of atrial flutter.  Initially was on Cardizem drip.  Remained in rapid response so metoprolol was initiated.  Electrophysiology cardiology was consulted and underwent DC cardioversion 02/05/2023 with normal sinus rhythm.  Will continue to monitor..  2D echocardiogram from 01/30/2023 showed LV ejection fraction of 55 to 60% with indeterminate diastolic parameters.  Status postcardiac catheterization on 02/02/2023 with the insignificant stenosis of the RCA and mid LAD.  Recommended apixaban and antiplatelet therapy.  Findings were mild and small nonobstructive CAD.  TSH of 4.2. On Asprin, Eliquis.     Patient would likely benefit from assessment for sleep apnea as outpatient.     Acute on chronic diastolic heart failure:  Likely triggered by atrial fibrillation. 2D echocardiogram with preserved LV function. Continue aspirin, Lipitor, Lasix.  Getting IV Lasix 40 mg daily.  Strict intake and output  charting, daily weights.  Status post cardiac catheterization on 02/02/2023 with findings of nonobstructive CAD.  Still on 3 L of oxygen by nasal cannula.  Patient is negative balance for 10, 378 mL.   COPD: Continue Xopenex.   Tobacco abuse:Continue  nicotine patch.   History of alcohol abuse: Patient currently in remission.  No signs of withdrawal.  History of hypertension.  Was on losartan.  Currently on hold.  Continue metoprolol    morbid obesity: Body mass index is 41.18 kg/m.  Would benefit from weight loss as outpatient.      DVT prophylaxis: Heparin drip apixaban (ELIQUIS) tablet 5 mg   Code Status:     Code Status: Full Code  Disposition: Home likely in 1-2 days when okay with cardiology.  Will get PT evaluation today.   Status is: Inpatient  Remains inpatient appropriate because: A-fib with RVR status post DC cardioversion, congestive heart failure, respiratory failure.    Family Communication:  Spoke with the patient's daughter on 7/29  Consultants:  Cardiology  Procedures:  Right and left heart catheterization on 02/02/2023 DCCV 02/04/2023.   Antimicrobials:  None  Anti-infectives (From admission, onward)    None      Subjective: Today, patient was seen and examined after DC cardioversion.  On supplemental oxygen.  Denies overt shortness of breath or dyspnea or chest pain.  Still got some leg swelling.  Objective: Vitals:   02/04/23 1111 02/04/23 1123 02/04/23 1128 02/04/23 1143  BP: (!) 119/97 118/79 116/80 129/85  Pulse: 79 77 78 89  Resp: 18 18 20    Temp:    97.6 F (36.4 C)  TempSrc:    Oral  SpO2: 95%  94% 95% 99%  Weight:      Height:        Intake/Output Summary (Last 24 hours) at 02/04/2023 1322 Last data filed at 02/04/2023 0700 Gross per 24 hour  Intake 240 ml  Output 1275 ml  Net -1035 ml   Filed Weights   02/02/23 0356 02/03/23 0554 02/04/23 0507  Weight: 124.4 kg 121.9 kg 120.3 kg    Physical Examination: Body mass index  is 39.18 kg/m.   General: Obese built, not in obvious distress, on nasal cannula oxygen at 3 L/min.   HENT:   No scleral pallor or icterus noted. Oral mucosa is moist.  Chest:   Diminished breath sounds bilaterally.   CVS: S1 &S2 heard. No murmur.  Normal sinus rhythm Abdomen: Soft, nontender, nondistended.  Bowel sounds are heard.   Extremities: No cyanosis,  bilateral lower extremity edema++  Psych: Alert, awake and oriented, normal mood CNS:  No cranial nerve deficits.  Power equal in all extremities.   Skin: Warm and dry.  No rashes noted.  Data Reviewed:   CBC: Recent Labs  Lab 01/30/23 0048 01/31/23 1610 02/01/23 0121 02/02/23 0205 02/02/23 9604 02/02/23 0823 02/02/23 0831 02/03/23 0115  WBC 10.8* 12.0* 10.6* 9.5  --   --   --  9.0  HGB 16.5 15.3 15.7 15.7 17.0 17.0 17.0 15.5  HCT 50.7 51.1 51.3 52.4* 50.0 50.0 50.0 50.2  MCV 93.4 92.4 90.3 91.8  --   --   --  92.8  PLT 221 232 197 205  --   --   --  195    Basic Metabolic Panel: Recent Labs  Lab 01/29/23 1615 01/30/23 0048 01/31/23 0823 02/01/23 0121 02/02/23 0205 02/02/23 0822 02/02/23 0823 02/02/23 0831 02/03/23 0115  NA  --  143 139 135 137 136 137 137 139  K  --  4.1 4.0 3.9 3.9 4.1 4.1 4.1 3.6  CL  --  93* 90* 88* 88*  --   --   --  91*  CO2  --  35* 35* 38* 40*  --   --   --  38*  GLUCOSE  --  109* 118* 116* 133*  --   --   --  145*  BUN  --  17 19 17 17   --   --   --  15  CREATININE  --  1.07 1.33* 1.14 1.15  --   --   --  1.05  CALCIUM  --  9.2 8.7* 8.9 8.9  --   --   --  8.9  MG 2.0  --  1.8 1.8 1.8  --   --   --  1.9    Liver Function Tests: No results for input(s): "AST", "ALT", "ALKPHOS", "BILITOT", "PROT", "ALBUMIN" in the last 168 hours.   Radiology Studies: ECHO TEE  Result Date: 02/04/2023    TRANSESOPHOGEAL ECHO REPORT   Patient Name:   Ryan Hatfield [facility] Date of Exam: 02/04/2023 Medical Rec #:  [id number]         Height:       69.0 in Accession #:    [id number]        Weight:        265.3 lb Date of Birth:  03-01-1966         BSA:          2.330 m Patient Age:    57 years          BP:  106/67 mmHg Patient Gender: M                 HR:           101 bpm. Exam Location:  Inpatient Procedure: Transesophageal Echo, Cardiac Doppler and Color Doppler Indications:    Atrial flutter  History:        Patient has prior history of Echocardiogram examinations, most                 recent 01/30/2023. CHF, Arrythmias:Atrial Flutter,                 Signs/Symptoms:Chest Pain; Risk Factors:Hypertension. ETOH                 abuse, in remission.  Sonographer:    Milda Smart Referring Phys: [id number] SHENG L HALEY PROCEDURE: After discussion of the risks and benefits of a TEE, an informed consent was obtained from the patient. TEE procedure time was 5 minutes. The transesophogeal probe was passed without difficulty through the esophogus of the patient. Imaged were  obtained with the patient in a left lateral decubitus position. Sedation performed by different physician. The patient was monitored while under deep sedation. Anesthestetic sedation was provided intravenously by Anesthesiology: 75.19mg  of Propofol. Image quality was good. The patient developed Respiratory depression during the procedure. A successful direct current cardioversion was performed at 200 joules with 1 attempt. Transgastric views not obtained: respiratory depression.  IMPRESSIONS  1. Limited study, transgastric views not obtained as patient developed hypoxia and procedure was aborted  2. Left ventricular ejection fraction, by estimation, is 40 to 45%. The left ventricle has mildly decreased function.  3. Right ventricular systolic function is mildly reduced. The right ventricular size is normal.  4. Left atrial size was mildly dilated. No left atrial/left atrial appendage thrombus was detected.  5. Right atrial size was mildly dilated.  6. The mitral valve is normal in structure. Moderate mitral valve regurgitation.  7. The  aortic valve is tricuspid. Aortic valve regurgitation is trivial. Conclusion(s)/Recommendation(s): No LA/LAA thrombus identified. Successful cardioversion performed with restoration of normal sinus rhythm. FINDINGS  Left Ventricle: Left ventricular ejection fraction, by estimation, is 40 to 45%. The left ventricle has mildly decreased function. The left ventricular internal cavity size was normal in size. Right Ventricle: The right ventricular size is normal. No increase in right ventricular wall thickness. Right ventricular systolic function is mildly reduced. Left Atrium: Left atrial size was mildly dilated. No left atrial/left atrial appendage thrombus was detected. Right Atrium: Right atrial size was mildly dilated. Pericardium: There is no evidence of pericardial effusion. Mitral Valve: The mitral valve is normal in structure. Moderate mitral valve regurgitation. Tricuspid Valve: The tricuspid valve is normal in structure. Tricuspid valve regurgitation is mild. Aortic Valve: The aortic valve is tricuspid. Aortic valve regurgitation is trivial. Pulmonic Valve: The pulmonic valve was grossly normal. Pulmonic valve regurgitation is trivial. Aorta: The aortic root and ascending aorta are structurally normal, with no evidence of dilitation. IAS/Shunts: The interatrial septum was not assessed. Additional Comments: Spectral Doppler performed.  AORTA Ao Asc diam: 3.20 cm Epifanio Lesches MD Electronically signed by Epifanio Lesches MD Signature Date/Time: 02/04/2023/12:06:02 PM    Final    EP STUDY  Result Date: 02/04/2023 See surgical note for result.     LOS: 6 days     Joycelyn Das, MD [facility] Available via Epic secure chat 7am-7pm After these hours, please refer to coverage provider listed on [email or web address] 02/04/2023,  1:22 PM

## 2023-02-04 NOTE — Anesthesia Preprocedure Evaluation (Signed)
Anesthesia Evaluation  Patient identified by MRN, date of birth, ID band Patient awake    Reviewed: Allergy & Precautions, NPO status , Patient's Chart, lab work & pertinent test results  Airway Mallampati: IV  TM Distance: >3 FB Neck ROM: Full    Dental  (+) Dental Advisory Given, Loose, Missing, Edentulous Upper,    Pulmonary Current Smoker and Patient abstained from smoking.   Pulmonary exam normal breath sounds clear to auscultation       Cardiovascular hypertension, Pt. on medications + dysrhythmias Atrial Fibrillation  Rhythm:Irregular Rate:Abnormal     Neuro/Psych Seizures -,  PSYCHIATRIC DISORDERS Anxiety Depression       GI/Hepatic negative GI ROS,,,(+)     substance abuse  alcohol use  Endo/Other  Hypothyroidism  Obesity   Renal/GU negative Renal ROS     Musculoskeletal  (+) Arthritis ,    Abdominal   Peds  Hematology negative hematology ROS (+)   Anesthesia Other Findings Day of surgery medications reviewed with the patient.  Reproductive/Obstetrics                             Anesthesia Physical Anesthesia Plan  ASA: 3  Anesthesia Plan: General   Post-op Pain Management: Minimal or no pain anticipated   Induction: Intravenous  PONV Risk Score and Plan: 1 and TIVA  Airway Management Planned: Natural Airway and Simple Face Mask  Additional Equipment:   Intra-op Plan:   Post-operative Plan:   Informed Consent: I have reviewed the patients History and Physical, chart, labs and discussed the procedure including the risks, benefits and alternatives for the proposed anesthesia with the patient or authorized representative who has indicated his/her understanding and acceptance.     Dental advisory given  Plan Discussed with: CRNA  Anesthesia Plan Comments:        Anesthesia Quick Evaluation

## 2023-02-05 ENCOUNTER — Other Ambulatory Visit (HOSPITAL_COMMUNITY): Payer: Self-pay

## 2023-02-05 ENCOUNTER — Encounter (HOSPITAL_COMMUNITY): Payer: Self-pay | Admitting: Cardiology

## 2023-02-05 DIAGNOSIS — I4892 Unspecified atrial flutter: Secondary | ICD-10-CM | POA: Diagnosis not present

## 2023-02-05 DIAGNOSIS — I483 Typical atrial flutter: Secondary | ICD-10-CM | POA: Diagnosis not present

## 2023-02-05 DIAGNOSIS — I5031 Acute diastolic (congestive) heart failure: Secondary | ICD-10-CM | POA: Diagnosis not present

## 2023-02-05 DIAGNOSIS — F1011 Alcohol abuse, in remission: Secondary | ICD-10-CM | POA: Diagnosis not present

## 2023-02-05 DIAGNOSIS — E039 Hypothyroidism, unspecified: Secondary | ICD-10-CM | POA: Diagnosis not present

## 2023-02-05 MED ORDER — LOSARTAN POTASSIUM 100 MG PO TABS
50.0000 mg | ORAL_TABLET | Freq: Every day | ORAL | 3 refills | Status: DC
Start: 2023-02-05 — End: 2023-02-05
  Filled 2023-02-05: qty 15, 30d supply, fill #0

## 2023-02-05 MED ORDER — APIXABAN 5 MG PO TABS
5.0000 mg | ORAL_TABLET | Freq: Two times a day (BID) | ORAL | 2 refills | Status: DC
Start: 2023-02-05 — End: 2023-03-12
  Filled 2023-02-05: qty 60, 30d supply, fill #0

## 2023-02-05 MED ORDER — FOLIC ACID 1 MG PO TABS
1.0000 mg | ORAL_TABLET | Freq: Every day | ORAL | 1 refills | Status: DC
  Filled 2023-02-05: qty 30, 30d supply, fill #0

## 2023-02-05 MED ORDER — ASPIRIN 81 MG PO TBEC: 81.0000 mg | DELAYED_RELEASE_TABLET | Freq: Every day | ORAL | Status: DC

## 2023-02-05 MED ORDER — METOPROLOL SUCCINATE ER 50 MG PO TB24
150.0000 mg | ORAL_TABLET | Freq: Every day | ORAL | 2 refills | Status: DC
  Filled 2023-02-05: qty 90, 30d supply, fill #0
  Filled 2023-02-05: qty 30, 10d supply, fill #0

## 2023-02-05 MED ORDER — FUROSEMIDE 40 MG PO TABS
40.0000 mg | ORAL_TABLET | Freq: Every day | ORAL | 2 refills | Status: DC
Start: 2023-02-05 — End: 2023-03-12
  Filled 2023-02-05: qty 30, 30d supply, fill #0

## 2023-02-05 MED ORDER — NICOTINE 21 MG/24HR TD PT24
21.0000 mg | MEDICATED_PATCH | Freq: Every day | TRANSDERMAL | 0 refills | Status: DC
  Filled 2023-02-05: qty 28, 28d supply, fill #0

## 2023-02-05 NOTE — TOC Initial Note (Signed)
Transition of Care [facility]) - Initial/Assessment Note    Patient Details  Name: Ryan Hatfield MRN: [id number] Date of Birth: [birth year]  Transition of Care [facility]) CM/SW Contact:    Gala Lewandowsky, RN Phone Number: 02/05/2023, 9:57 AM  Clinical Narrative: Case Manager received a secure chat that the patient will need oxygen for home. DME was discussed with patient and he chose [employer]. Referral submitted to [employer] and oxygen will be delivered today to the room. No further needs identified at this time.             Expected Discharge Plan: Home/Self Care Barriers to Discharge: No Barriers Identified   Patient Goals and CMS Choice Patient states their goals for this hospitalization and ongoing recovery are:: to return home  Expected Discharge Plan and Services In-house Referral: NA Discharge Planning Services: CM Consult Post Acute Care Choice: Durable Medical Equipment Living arrangements for the past 2 months: Mobile Home Expected Discharge Date: 02/05/23               DME Arranged: Oxygen DME Agency: [employer] Date DME Agency Contacted: 02/05/23 Time DME Agency Contacted: [phone] Representative spoke with at DME Agency: Vaughan Basta HH Arranged: NA  Prior Living Arrangements/Services Living arrangements for the past 2 months: Mobile Home Lives with:: Self Patient language and need for interpreter reviewed:: Yes Do you feel safe going back to the place where you live?: Yes      Need for Family Participation in Patient Care: Yes (Comment) Care giver support system in place?: Yes (comment)   Criminal Activity/Legal Involvement Pertinent to Current Situation/Hospitalization: No - Comment as needed  Activities of Daily Living Home Assistive Devices/Equipment: None ADL Screening (condition at time of admission) Patient's cognitive ability adequate to safely complete daily activities?: Yes Is the patient deaf or have difficulty hearing?: No Does the patient  have difficulty seeing, even when wearing glasses/contacts?: No Does the patient have difficulty concentrating, remembering, or making decisions?: No Patient able to express need for assistance with ADLs?: Yes Does the patient have difficulty dressing or bathing?: No Independently performs ADLs?: Yes (appropriate for developmental age) Does the patient have difficulty walking or climbing stairs?: Yes Weakness of Legs: Both Weakness of Arms/Hands: None  Permission Sought/Granted Permission sought to share information with : Family Supports, Magazine features editor, Case Automotive engineer granted to share info w AGENCY: [employer]    Emotional Assessment Appearance:: Appears stated age Attitude/Demeanor/Rapport: Engaged Affect (typically observed): Appropriate Orientation: : Oriented to Self, Oriented to Place, Oriented to  Time, Oriented to Situation Alcohol / Substance Use: Not Applicable Psych Involvement: No (comment)  Admission diagnosis:  Acute diastolic congestive heart failure (HCC) [I50.31] Atrial flutter with rapid ventricular response (HCC) [I48.92] Patient Active Problem List   Diagnosis Date Noted   Tachycardia 01/29/2023   Acute diastolic heart failure (HCC) 01/29/2023   Atrial flutter (HCC) 01/29/2023   Atrial flutter with rapid ventricular response (HCC) 01/29/2023   Urinary retention 10/21/2022   Acute cystitis with hematuria 10/21/2022   Diastasis of rectus abdominis 09/26/2022   Leukocytosis 09/05/2021   Vitamin B12 deficiency 08/07/2021   Vitamin D deficiency 08/07/2021   Chronic gout of right foot 08/07/2021   COPD (chronic obstructive pulmonary disease) (HCC) 10/17/2019   Acquired hypothyroidism 10/17/2019   Mixed hyperlipidemia 10/17/2019   Essential hypertension 09/13/2019   Benign neoplasm of brain (HCC) 10/26/2017   Carpal tunnel syndrome 01/17/2016   Anxiety and depression 05/29/2015  Alcohol abuse, in remission 05/29/2015    Arthritis, degenerative 02/03/2013   PCP:  Mort Sawyers, FNP Pharmacy:   [facility] - [place], [place] - [address] [place] [address] Phone: [phone] Fax: [phone]  [facility] [address], [place] - [address] [place] [address] Phone: [phone] Fax: [phone]  [facility] [address] [place] [address] Phone: [phone] Fax: [phone]  Social Determinants of Health (SDOH) Social History: SDOH Screenings   Food Insecurity: No Food Insecurity (01/30/2023)  Housing: Low Risk  (01/30/2023)  Transportation Needs: No Transportation Needs (01/30/2023)  Utilities: Not At Risk (01/30/2023)  Depression (PHQ2-9): Medium Risk (10/21/2022)  Tobacco Use: High Risk (01/29/2023)   Readmission Risk Interventions     No data to display

## 2023-02-05 NOTE — Progress Notes (Signed)
Pt being d/c, VSS, IV removed, Education complete, O2 and TOC meds being delivered to bedside.   Balinda Quails, RN 02/05/2023 10:36 AM

## 2023-02-05 NOTE — Progress Notes (Signed)
Rounding Note    Patient Name: Ryan Hatfield Date of Encounter: 02/05/2023  [facility] Cardiologist: Debbe Odea, MD   Subjective   Looks good maintaining NSR post TEE/DCC. Some issues with  Hypoxia post propofol and procedure   Inpatient Medications    Scheduled Meds:  apixaban  5 mg Oral BID   aspirin EC  81 mg Oral Daily   atorvastatin  40 mg Oral Daily   folic acid  1 mg Oral Daily   furosemide  40 mg Intravenous Daily   levothyroxine  50 mcg Oral Q0600   metoprolol tartrate  50 mg Oral Q8H   nicotine  21 mg Transdermal Daily   sodium chloride flush  3 mL Intravenous Q12H   umeclidinium bromide  1 puff Inhalation Daily   Continuous Infusions:  sodium chloride     PRN Meds: sodium chloride, acetaminophen, alum & mag hydroxide-simeth, diazepam, levalbuterol, ondansetron (ZOFRAN) IV, sodium chloride flush, traZODone   Vital Signs    Vitals:   02/04/23 1359 02/04/23 2009 02/05/23 0427 02/05/23 0739  BP: (!) 147/86 129/72 113/71   Pulse: 100 95 80   Resp: 17 18 17    Temp: 97.8 F (36.6 C) 98.2 F (36.8 C) 98.2 F (36.8 C)   TempSrc: Oral Oral Oral   SpO2: 98% 96% 98% 97%  Weight:   119.7 kg   Height:        Intake/Output Summary (Last 24 hours) at 02/05/2023 0816 Last data filed at 02/05/2023 0000 Gross per 24 hour  Intake 480 ml  Output 1800 ml  Net -1320 ml      02/05/2023    4:27 AM 02/04/2023    5:07 AM 02/03/2023    5:54 AM  Last 3 Weights  Weight (lbs) 263 lb 12.8 oz 265 lb 4.8 oz 268 lb 12.8 oz  Weight (kg) 119.659 kg 120.339 kg 121.927 kg      Telemetry    Atrial flutter-personally reviewed  ECG    None new  Physical Exam   GEN: Well nourished, well developed, in no acute distress  HEENT: normal  Neck: no JVD, carotid bruits, or masses Cardiac: irregular, tachycardic; no murmurs, rubs, or gallops,no edema  Respiratory:  clear to auscultation bilaterally, normal work of breathing GI: soft, nontender, nondistended,  + BS MS: no deformity or atrophy  Skin: warm and dry Neuro:  Strength and sensation are intact Psych: euthymic mood, full affect  Right radial cath site A   Labs    High Sensitivity Troponin:   Recent Labs  Lab 01/29/23 1615 01/29/23 1929  TROPONINIHS 20* 19*     Chemistry Recent Labs  Lab 02/02/23 0205 02/02/23 0822 02/02/23 0831 02/03/23 0115 02/05/23 0055  NA 137   < > 137 139 135  K 3.9   < > 4.1 3.6 3.7  CL 88*  --   --  91* 89*  CO2 40*  --   --  38* 34*  GLUCOSE 133*  --   --  145* 111*  BUN 17  --   --  15 18  CREATININE 1.15  --   --  1.05 1.06  CALCIUM 8.9  --   --  8.9 8.7*  MG 1.8  --   --  1.9 1.9  GFRNONAA >60  --   --  >60 >60  ANIONGAP 9  --   --  10 12   < > = values in this interval not displayed.  Lipids  Recent Labs  Lab 01/30/23 0048  CHOL 126  TRIG 96  HDL 28*  LDLCALC 79  CHOLHDL 4.5    Hematology Recent Labs  Lab 02/02/23 0205 02/02/23 6962 02/02/23 0831 02/03/23 0115 02/05/23 0055  WBC 9.5  --   --  9.0 9.9  RBC 5.71  --   --  5.41 5.30  HGB 15.7   < > 17.0 15.5 15.2  HCT 52.4*   < > 50.0 50.2 48.8  MCV 91.8  --   --  92.8 92.1  MCH 27.5  --   --  28.7 28.7  MCHC 30.0  --   --  30.9 31.1  RDW 13.8  --   --  13.9 13.8  PLT 205  --   --  195 184   < > = values in this interval not displayed.   Thyroid  Recent Labs  Lab 01/29/23 1929  TSH 4.222    BNP Recent Labs  Lab 01/29/23 1422  BNP 123.1*    DDimer  Recent Labs  Lab 01/29/23 1615  DDIMER 1.17*     Radiology    ECHO TEE  Result Date: 02/04/2023    TRANSESOPHOGEAL ECHO REPORT   Patient Name:   Ryan Hatfield [facility] Date of Exam: 02/04/2023 Medical Rec #:  [id number]         Height:       69.0 in Accession #:    [id number]        Weight:       265.3 lb Date of Birth:  Jan 29, 1966         BSA:          2.330 m Patient Age:    57 years          BP:           106/67 mmHg Patient Gender: M                 HR:           101 bpm. Exam Location:  Inpatient Procedure:  Transesophageal Echo, Cardiac Doppler and Color Doppler Indications:    Atrial flutter  History:        Patient has prior history of Echocardiogram examinations, most                 recent 01/30/2023. CHF, Arrythmias:Atrial Flutter,                 Signs/Symptoms:Chest Pain; Risk Factors:Hypertension. ETOH                 abuse, in remission.  Sonographer:    Milda Smart Referring Phys: [id number] SHENG L HALEY PROCEDURE: After discussion of the risks and benefits of a TEE, an informed consent was obtained from the patient. TEE procedure time was 5 minutes. The transesophogeal probe was passed without difficulty through the esophogus of the patient. Imaged were  obtained with the patient in a left lateral decubitus position. Sedation performed by different physician. The patient was monitored while under deep sedation. Anesthestetic sedation was provided intravenously by Anesthesiology: 75.19mg  of Propofol. Image quality was good. The patient developed Respiratory depression during the procedure. A successful direct current cardioversion was performed at 200 joules with 1 attempt. Transgastric views not obtained: respiratory depression.  IMPRESSIONS  1. Limited study, transgastric views not obtained as patient developed hypoxia and procedure was aborted  2. Left ventricular ejection fraction, by estimation, is 40 to 45%. The left ventricle  has mildly decreased function.  3. Right ventricular systolic function is mildly reduced. The right ventricular size is normal.  4. Left atrial size was mildly dilated. No left atrial/left atrial appendage thrombus was detected.  5. Right atrial size was mildly dilated.  6. The mitral valve is normal in structure. Moderate mitral valve regurgitation.  7. The aortic valve is tricuspid. Aortic valve regurgitation is trivial. Conclusion(s)/Recommendation(s): No LA/LAA thrombus identified. Successful cardioversion performed with restoration of normal sinus rhythm. FINDINGS  Left  Ventricle: Left ventricular ejection fraction, by estimation, is 40 to 45%. The left ventricle has mildly decreased function. The left ventricular internal cavity size was normal in size. Right Ventricle: The right ventricular size is normal. No increase in right ventricular wall thickness. Right ventricular systolic function is mildly reduced. Left Atrium: Left atrial size was mildly dilated. No left atrial/left atrial appendage thrombus was detected. Right Atrium: Right atrial size was mildly dilated. Pericardium: There is no evidence of pericardial effusion. Mitral Valve: The mitral valve is normal in structure. Moderate mitral valve regurgitation. Tricuspid Valve: The tricuspid valve is normal in structure. Tricuspid valve regurgitation is mild. Aortic Valve: The aortic valve is tricuspid. Aortic valve regurgitation is trivial. Pulmonic Valve: The pulmonic valve was grossly normal. Pulmonic valve regurgitation is trivial. Aorta: The aortic root and ascending aorta are structurally normal, with no evidence of dilitation. IAS/Shunts: The interatrial septum was not assessed. Additional Comments: Spectral Doppler performed.  AORTA Ao Asc diam: 3.20 cm Epifanio Lesches MD Electronically signed by Epifanio Lesches MD Signature Date/Time: 02/04/2023/12:06:02 PM    Final    EP STUDY  Result Date: 02/04/2023 See surgical note for result.   Cardiac Studies   TTE  1. LVEF is normal Cannot fully evaluate regional wall motion as  endocardium is not well seen. . Left ventricular ejection fraction, by  estimation, is 55 to 60%. The left ventricle has normal function. The left  ventricle has no regional wall motion  abnormalities. Left ventricular diastolic parameters are indeterminate.   2. Right ventricular systolic function is normal. The right ventricular  size is normal.   3. Trivial mitral valve regurgitation.   4. The aortic valve is tricuspid. Aortic valve regurgitation is not  visualized.  Aortic valve sclerosis/calcification is present, without any  evidence of aortic stenosis.   5. The inferior vena cava is normal in size with greater than 50%  respiratory variability, suggesting right atrial pressure of 3 mmHg.   Patient Profile     57 y.o. male present to the hospital with chest pain and shortness of breath, found to be in rapid atrial flutter. Restoration of NSR with TEE/DCC 02/03/23   Assessment & Plan    1.  Typical atrial flutter: Likely the cause of the patient's symptoms.  He presented with significant tachycardia.  Back on eliquis post cath TEE/DCC successful yesterday. Reminded him not to miss any DOAC especially the first 4 weeks post [facility]   2.  Acute diastolic heart failure: Likely due to rapid atrial flutter.  Improved with diuresis   3.  Chest pain: non cardiac cath 7/29 with no obstructive Disease   Ok to d/c home Will arrange outpatient f/u in [place] with Dr Azucena Cecil    For questions or updates, please contact [facility] Please consult [email or web address] for contact info under        Signed, Charlton Haws, MD  02/05/2023, 8:16 AM

## 2023-02-05 NOTE — Discharge Summary (Signed)
Physician Discharge Summary  Ryan Hatfield WUJ:[id number] DOB: 11/05/1965 DOA: 01/29/2023  PCP: Mort Sawyers, FNP  Admit date: 01/29/2023 Discharge date: 02/06/2023  Admitted From: Home  Discharge disposition: Home    Recommendations for Outpatient Follow-Up:   Follow up with your primary care provider in one week.  Check CBC, BMP, magnesium in the next visit Follow-up with your cardiologist at [facility] in 1 to 2 weeks Patient would benefit from sleep apnea study as outpatient.   Discharge Diagnosis:   Principal Problem:   Atrial flutter with rapid ventricular response (HCC) Active Problems:   Anxiety and depression   Alcohol abuse, in remission   Essential hypertension   COPD (chronic obstructive pulmonary disease) (HCC)   Acquired hypothyroidism   Mixed hyperlipidemia   Acute diastolic heart failure (HCC)   Discharge Condition: Improved.  Diet recommendation: Low sodium, heart healthy.    Wound care: None.  Code status: Full.   History of Present Illness:   Ryan Hatfield is a 57 y.o. male with medical history significant of tobacco abuse, COPD, alcohol abuse in remission, morbid obesity, anxiety with depression, diastolic dysfunction CHF presented to the ER with significant shortness of breath, cough and palpitations for 3 days with some leg swelling.  Patient also had PND and orthopnea.  In the ED, patient was tachycardic with irregular rhythm and was noted to have atrial flutter with rapid ventricular response.  Pulse ox was 87% on room air.  Patient was then admitted hospital for further evaluation and treatment.  Hospital Course:   Following conditions were addressed during hospitalization as listed below,  Typical atrial flutter with rapid ventricular response:  New diagnosis of atrial flutter.  Initially was on Cardizem drip.  Remained in rapid response so metoprolol was initiated.  Electrophysiology cardiology was consulted and underwent DC  cardioversion 02/05/2023 with conversion to normal sinus rhythm.   2D echocardiogram from 01/30/2023 showed LV ejection fraction of 55 to 60% with indeterminate diastolic parameters.  Status postcardiac catheterization on 02/02/2023 with the insignificant stenosis of the RCA and mid LAD.  Recommended apixaban and antiplatelet therapy.  Findings were mild and small nonobstructive CAD.  TSH of 4.2. On Asprin, metoprolol, Eliquis and will continue on discharge..     Patient would likely benefit from assessment for sleep apnea as outpatient.     Acute on chronic diastolic heart failure:  Resolved.  Likely triggered by atrial fibrillation. 2D echocardiogram with preserved LV function. Continue aspirin, Lipitor, Lasix on discharge  Status post cardiac catheterization on 02/02/2023 with findings of nonobstructive CAD.     COPD: Continue albuterol inhaler on discharge.  At this time patient has qualified for home oxygen on discharge.   Tobacco abuse:Continue  nicotine patch on discharge.Marland Kitchen   History of alcohol abuse: Patient currently in remission.  No signs of withdrawal.   History of hypertension.  Was on losartan.  Patient will continue metoprolol on discharge.     morbid obesity: Body mass index is 41.18 kg/m.  Would benefit from weight loss as outpatient.   Disposition.  At this time, patient is stable for disposition home with outpatient PCP and cardiology follow-up.  Medical Consultants:   Cardiology   Procedures:    Right and left heart catheterization on 02/02/2023 DCCV 02/04/2023.   Subjective:   Today, patient  was seen and examined at bedside.  Continues to feel well. No dyspnea or chest pain.  Has qualified for home oxygen.  Discharge Exam:   Vitals:  02/05/23 0916 02/05/23 0925  BP:    Pulse:    Resp:  16  Temp:  97.7 F (36.5 C)  SpO2: 96%    Vitals:   02/05/23 0739 02/05/23 0915 02/05/23 0916 02/05/23 0925  BP:      Pulse:      Resp:    16  Temp:    97.7 F (36.5 C)   TempSrc:    Oral  SpO2: 97% (!) 86% 96%   Weight:      Height:       Body mass index is 38.96 kg/m.   General: Alert awake, not in obvious distress, obese built, on nasal cannula oxygen HENT: pupils equally reacting to light,  No scleral pallor or icterus noted. Oral mucosa is moist.  Chest:  Clear breath sounds. . No crackles or wheezes.  CVS: S1 &S2 heard. No murmur.  Regular rate and rhythm. Abdomen: Soft, nontender, nondistended.  Bowel sounds are heard.   Extremities: No cyanosis, clubbing trace edema.  Peripheral pulses are palpable. Psych: Alert, awake and oriented, normal mood CNS:  No cranial nerve deficits.  Power equal in all extremities.   Skin: Warm and dry.  No rashes noted.  The results of significant diagnostics from this hospitalization (including imaging, microbiology, ancillary and laboratory) are listed below for reference.     Diagnostic Studies:   ECHOCARDIOGRAM COMPLETE  Result Date: 01/30/2023    ECHOCARDIOGRAM REPORT   Patient Name:   Ryan Hatfield [facility] Date of Exam: 01/30/2023 Medical Rec #:  [id number]         Height:       69.0 in Accession #:    [id number]        Weight:       276.0 lb Date of Birth:  [birth year]         BSA:          2.369 m Patient Age:    57 years          BP:           131/83 mmHg Patient Gender: M                 HR:           104 bpm. Exam Location:  Inpatient Procedure: 2D Echo, Cardiac Doppler and Color Doppler Indications:    I48.3 Typical atrial flutter  History:        Patient has prior history of Echocardiogram examinations, most                 recent 02/25/2022. Signs/Symptoms:Chest Pain; Risk                 Factors:Hypertension.  Sonographer:    Darlys Gales Referring Phys: [id number] MOHAMMAD L GARBA IMPRESSIONS  1. LVEF is normal Cannot fully evaluate regional wall motion as endocardium is not well seen. . Left ventricular ejection fraction, by estimation, is 55 to 60%. The left ventricle has normal function. The left ventricle has no  regional wall motion abnormalities. Left ventricular diastolic parameters are indeterminate.  2. Right ventricular systolic function is normal. The right ventricular size is normal.  3. Trivial mitral valve regurgitation.  4. The aortic valve is tricuspid. Aortic valve regurgitation is not visualized. Aortic valve sclerosis/calcification is present, without any evidence of aortic stenosis.  5. The inferior vena cava is normal in size with greater than 50% respiratory variability, suggesting right atrial pressure of 3 mmHg. Comparison(s): The left ventricular function  is unchanged. FINDINGS  Left Ventricle: LVEF is normal Cannot fully evaluate regional wall motion as endocardium is not well seen. Left ventricular ejection fraction, by estimation, is 55 to 60%. The left ventricle has normal function. The left ventricle has no regional wall motion abnormalities. The left ventricular internal cavity size was normal in size. There is no left ventricular hypertrophy. Left ventricular diastolic parameters are indeterminate. Right Ventricle: The right ventricular size is normal. Right vetricular wall thickness was not assessed. Right ventricular systolic function is normal. Left Atrium: Left atrial size was normal in size. Right Atrium: Right atrial size was normal in size. Pericardium: There is no evidence of pericardial effusion. Mitral Valve: There is mild thickening of the mitral valve leaflet(s). Mild to moderate mitral annular calcification. Trivial mitral valve regurgitation. Tricuspid Valve: The tricuspid valve is normal in structure. Tricuspid valve regurgitation is trivial. Aortic Valve: The aortic valve is tricuspid. Aortic valve regurgitation is not visualized. Aortic valve sclerosis/calcification is present, without any evidence of aortic stenosis. Pulmonic Valve: The pulmonic valve was not well visualized. Pulmonic valve regurgitation is not visualized. Aorta: The aortic root and ascending aorta are  structurally normal, with no evidence of dilitation. Venous: The inferior vena cava is normal in size with greater than 50% respiratory variability, suggesting right atrial pressure of 3 mmHg. IAS/Shunts: No atrial level shunt detected by color flow Doppler.  LEFT VENTRICLE PLAX 2D LVIDd:         4.30 cm   Diastology LVIDs:         3.20 cm   LV e' medial:    8.49 cm/s LV PW:         1.10 cm   LV E/e' medial:  15.5 LV IVS:        1.10 cm   LV e' lateral:   7.94 cm/s LVOT diam:     1.90 cm   LV E/e' lateral: 16.6 LVOT Area:     2.84 cm  RIGHT VENTRICLE RV S prime:     8.81 cm/s TAPSE (M-mode): 1.9 cm LEFT ATRIUM             Index        RIGHT ATRIUM           Index LA Vol (A2C):   61.9 ml 26.13 ml/m  RA Area:     21.60 cm LA Vol (A4C):   98.3 ml 41.49 ml/m  RA Volume:   64.05 ml  27.04 ml/m LA Biplane Vol: 80.9 ml 34.15 ml/m   AORTA Ao Asc diam: 3.40 cm MITRAL VALVE                TRICUSPID VALVE MV Area (PHT): 3.00 cm     TR Peak grad:   25.4 mmHg MV Decel Time: 253 msec     TR Vmax:        252.00 cm/s MV E velocity: 132.00 cm/s                             SHUNTS                             Systemic Diam: 1.90 cm Dietrich Pates MD Electronically signed by Dietrich Pates MD Signature Date/Time: 01/30/2023/6:08:57 PM    Final    CT Angio Chest Pulmonary Embolism (PE) W or WO Contrast  Result Date: 01/29/2023 CLINICAL DATA:  Positive  D-dimer. EXAM: CT ANGIOGRAPHY CHEST WITH CONTRAST TECHNIQUE: Multidetector CT imaging of the chest was performed using the standard protocol during bolus administration of intravenous contrast. Multiplanar CT image reconstructions and MIPs were obtained to evaluate the vascular anatomy. RADIATION DOSE REDUCTION: This exam was performed according to the departmental dose-optimization program which includes automated exposure control, adjustment of the mA and/or kV according to patient size and/or use of iterative reconstruction technique. CONTRAST:  75mL OMNIPAQUE IOHEXOL 350 MG/ML SOLN  COMPARISON:  Radiograph of same day. FINDINGS: Cardiovascular: Satisfactory opacification of the pulmonary arteries to the segmental level. No evidence of pulmonary embolism. Normal heart size. No pericardial effusion. Mediastinum/Nodes: No enlarged mediastinal, hilar, or axillary lymph nodes. Thyroid gland, trachea, and esophagus demonstrate no significant findings. Lungs/Pleura: No pneumothorax is noted. Small right pleural effusion is noted with adjacent atelectasis of the right lower lobe. Left lung is unremarkable. Upper Abdomen: No acute abnormality. Musculoskeletal: No chest wall abnormality. No acute or significant osseous findings. Review of the MIP images confirms the above findings. IMPRESSION: No definite evidence of pulmonary embolus. Small right pleural effusion is noted with adjacent atelectasis of right lower lobe. Aortic Atherosclerosis (ICD10-I70.0). Electronically Signed   By: Lupita Raider M.D.   On: 01/29/2023 18:02   DG Chest Port 1 View  Result Date: 01/29/2023 CLINICAL DATA:  5107 Atrial fibrillation (HCC) 5107 EXAM: PORTABLE CHEST 1 VIEW COMPARISON:  01/14/2022. FINDINGS: Mild pulmonary vascular congestion, which may be accentuated by low lung volume. Probable linear area of atelectasis overlying the right mid lung zone. There is homogeneous opacity obscuring the right lateral costophrenic angle, which may represent small right pleural effusion with probable associated compressive atelectasis. Left lateral costophrenic angle is clear. Bilateral lungs otherwise clear. Stable cardio-mediastinal silhouette. No acute osseous abnormalities. The soft tissues are within normal limits. IMPRESSION: *Mild pulmonary vascular congestion. Probable small right pleural effusion with associated compressive atelectasis. Electronically Signed   By: Jules Schick M.D.   On: 01/29/2023 16:00     Labs:   Basic Metabolic Panel: Recent Labs  Lab 01/31/23 0823 02/01/23 0121 02/02/23 0205  02/02/23 5621 02/02/23 0823 02/02/23 0831 02/03/23 0115 02/05/23 0055  NA 139 135 137 136 137 137 139 135  K 4.0 3.9 3.9 4.1 4.1 4.1 3.6 3.7  CL 90* 88* 88*  --   --   --  91* 89*  CO2 35* 38* 40*  --   --   --  38* 34*  GLUCOSE 118* 116* 133*  --   --   --  145* 111*  BUN 19 17 17   --   --   --  15 18  CREATININE 1.33* 1.14 1.15  --   --   --  1.05 1.06  CALCIUM 8.7* 8.9 8.9  --   --   --  8.9 8.7*  MG 1.8 1.8 1.8  --   --   --  1.9 1.9   GFR Estimated Creatinine Clearance: 95.8 mL/min (by C-G formula based on SCr of 1.06 mg/dL). Liver Function Tests: No results for input(s): "AST", "ALT", "ALKPHOS", "BILITOT", "PROT", "ALBUMIN" in the last 168 hours. No results for input(s): "LIPASE", "AMYLASE" in the last 168 hours. No results for input(s): "AMMONIA" in the last 168 hours. Coagulation profile No results for input(s): "INR", "PROTIME" in the last 168 hours.  CBC: Recent Labs  Lab 01/31/23 3086 02/01/23 0121 02/02/23 0205 02/02/23 5784 02/02/23 0823 02/02/23 0831 02/03/23 0115 02/05/23 0055  WBC 12.0* 10.6* 9.5  --   --   --  9.0 9.9  HGB 15.3 15.7 15.7 17.0 17.0 17.0 15.5 15.2  HCT 51.1 51.3 52.4* 50.0 50.0 50.0 50.2 48.8  MCV 92.4 90.3 91.8  --   --   --  92.8 92.1  PLT 232 197 205  --   --   --  195 184   Cardiac Enzymes: No results for input(s): "CKTOTAL", "CKMB", "CKMBINDEX", "TROPONINI" in the last 168 hours. BNP: Invalid input(s): "POCBNP" CBG: No results for input(s): "GLUCAP" in the last 168 hours. D-Dimer No results for input(s): "DDIMER" in the last 72 hours. Hgb A1c No results for input(s): "HGBA1C" in the last 72 hours. Lipid Profile No results for input(s): "CHOL", "HDL", "LDLCALC", "TRIG", "CHOLHDL", "LDLDIRECT" in the last 72 hours. Thyroid function studies No results for input(s): "TSH", "T4TOTAL", "T3FREE", "THYROIDAB" in the last 72 hours.  Invalid input(s): "FREET3" Anemia work up No results for input(s): "VITAMINB12", "FOLATE",  "FERRITIN", "TIBC", "IRON", "RETICCTPCT" in the last 72 hours. Microbiology No results found for this or any previous visit (from the past 240 hour(s)).   Discharge Instructions:   Discharge Instructions     Diet - low sodium heart healthy   Complete by: As directed    Discharge instructions   Complete by: As directed    Follow-up with the your cardiologist  Agbor Etang in [place] as a scheduled by the clinic in 1 to 2 weeks.  Follow-up with your primary care physician in 1 week.  Check blood work at that time.    Take all the medications as prescribed including blood thinners.  Take precautions while on blood thinners.  No overexertion.Seek medical attention for worsening symptoms. Use oxygen as prescribed   Increase activity slowly   Complete by: As directed       Allergies as of 02/05/2023       Reactions   Lisinopril Cough   Paroxetine    REACTION: paranoia, confusion   Sertraline Hcl    REACTION: paranoia, confusion        Medication List     STOP taking these medications    losartan 100 MG tablet Commonly known as: COZAAR       TAKE these medications    albuterol 108 (90 Base) MCG/ACT inhaler Commonly known as: VENTOLIN HFA TAKE 2 PUFFS BY MOUTH EVERY 6 HOURS AS NEEDED FOR WHEEZE OR SHORTNESS OF BREATH What changed:  how much to take how to take this when to take this   aspirin EC 81 MG tablet Take 1 tablet (81 mg total) by mouth daily. Swallow whole.   atorvastatin 40 MG tablet Commonly known as: LIPITOR Take 1 tablet (40 mg total) by mouth daily.   Eliquis 5 MG Tabs tablet Generic drug: apixaban Take 1 tablet (5 mg total) by mouth 2 (two) times daily.   folic acid 1 MG tablet Commonly known as: FOLVITE Take 1 tablet (1 mg total) by mouth daily.   furosemide 40 MG tablet Commonly known as: LASIX Take 1 tablet (40 mg total) by mouth daily. What changed:  medication strength how much to take additional instructions   levothyroxine 50  MCG tablet Commonly known as: SYNTHROID Take 1 tablet (50 mcg total) by mouth daily.   metoprolol succinate 50 MG 24 hr tablet Commonly known as: Toprol XL Take 3 tablets (150 mg total) by mouth daily. Take with or immediately following a meal.   nicotine 21 mg/24hr patch Commonly known as: NICODERM CQ - dosed in mg/24 hours Place 1 patch (21 mg  total) onto the skin daily.   tiotropium 18 MCG inhalation capsule Commonly known as: Spiriva HandiHaler INHALE 1 CAPSULE VIA HANDIHALER ONCE DAILY AT THE SAME TIME EVERY DAY What changed:  how much to take how to take this when to take this   traZODone 50 MG tablet Commonly known as: DESYREL TAKE 0.5-1 TABLETS BY MOUTH AT BEDTIME AS NEEDED FOR SLEEP.        Follow-up Information     Mort Sawyers, FNP Follow up in 1 day(s).   Specialty: Family Medicine Why: Routine checkup, blood work Solicitor information: [address] [place] [address] [phone]         Debbe Odea, MD. Go to.   Specialties: Cardiology, Radiology Why: when scheduled by the clinic or if not heard call for appointment in 2-3 weeks Contact information: [address] [place] [address] [phone]         [employer] Follow up.   Why: Oxygen- plan for delivery to the room Contact information: Address: [address], [place], [place] [address] Phone: [phone]                 Time coordinating discharge: 39 minutes  Signed:  Ellyn Rubiano  [facility] 02/06/2023, 4:06 PM

## 2023-02-05 NOTE — Progress Notes (Addendum)
SATURATION QUALIFICATIONS: (This note is used to comply with regulatory documentation for home oxygen)  Patient Saturations on Room Air at Rest = 84%  Patient Saturations on Room Air while Ambulating = 86%  Patient Saturations on 2 Liters of oxygen while Ambulating = 96%  Please briefly explain why patient needs home oxygen: Sp02 86% on room air with ambulation.   Donna Bernard, PT, MPT

## 2023-02-05 NOTE — Progress Notes (Signed)
Physical Therapy Treatment Patient Details Name: Ryan Hatfield MRN: [id number] DOB: 1965-09-09 Today's Date: 02/05/2023   History of Present Illness Pt is a 57 y/o male presenting to the ED with significant SOB, cough and palpitations.  Pt in A flutter with sats at 87% on RA.  PMH, tobacco abuse, anxiety with depression, d CHF, alcohol abuse in remission.    PT Comments  Patient is agreeable to PT session. No physical assistance required with mobility. Oxygen monitored on room air during session, 84% at rest and 86% with ambulation. Sp02 96% with ambulation on 2  L02. Activity tolerance limited by fatigue. Education on breathing techniques and energy conservation provided. Patient is hopeful for discharge home today.    If plan is discharge home, recommend the following: Assist for transportation   Can travel by private vehicle        Equipment Recommendations  None recommended by PT    Recommendations for Other Services       Precautions / Restrictions Precautions Precautions: None Precaution Comments: watch for O2 needs     Mobility  Bed Mobility               General bed mobility comments: not assessed as patient sitting up on arrival and post session    Transfers Overall transfer level: Modified independent                      Ambulation/Gait Ambulation/Gait assistance: Supervision, Modified independent (Device/Increase time) (supervision in hallway, Mod I in the room) Gait Distance (Feet): 200 Feet Assistive device: None Gait Pattern/deviations: Step-through pattern Gait velocity: decreased     General Gait Details: slow and cautious but steady with no assistance required. activity tolerance limited by fatigue. educated patient on energy conservation techniques to perform at home. SP02 86% on room air with ambulation on room air and and up to 96% with 2 L02. cues for breathing techniques. one short standing rest break required.   Stairs              Wheelchair Mobility     Tilt Bed    Modified Rankin (Stroke Patients Only)       Balance Overall balance assessment: No apparent balance deficits (not formally assessed)                                          Cognition Arousal/Alertness: Awake/alert Behavior During Therapy: WFL for tasks assessed/performed Overall Cognitive Status: Within Functional Limits for tasks assessed                                          Exercises      General Comments        Pertinent Vitals/Pain Pain Assessment Pain Assessment: Faces Faces Pain Scale: Hurts a little bit Pain Location: left leg and back Pain Descriptors / Indicators: Sore Pain Intervention(s): Limited activity within patient's tolerance, Monitored during session, Repositioned    Home Living                          Prior Function            PT Goals (current goals can now be found in the care plan section) Acute Rehab PT Goals  Patient Stated Goal: to get oxygen and to go home today PT Goal Formulation: With patient Time For Goal Achievement: 02/06/23 Potential to Achieve Goals: Good Progress towards PT goals: Progressing toward goals    Frequency    Min 2X/week      PT Plan Current plan remains appropriate    Co-evaluation              AM-PAC PT "6 Clicks" Mobility   Outcome Measure  Help needed turning from your back to your side while in a flat bed without using bedrails?: None Help needed moving from lying on your back to sitting on the side of a flat bed without using bedrails?: None Help needed moving to and from a bed to a chair (including a wheelchair)?: None Help needed standing up from a chair using your arms (e.g., wheelchair or bedside chair)?: None Help needed to walk in hospital room?: None Help needed climbing 3-5 steps with a railing? : None 6 Click Score: 24    End of Session Equipment Utilized During Treatment:  Oxygen Activity Tolerance: Patient tolerated treatment well;Patient limited by fatigue Patient left: in bed;with call bell/phone within reach;with nursing/sitter in room (seated on edge of bed) Nurse Communication: Mobility status PT Visit Diagnosis: Difficulty in walking, not elsewhere classified (R26.2)     Time: 7846-9629 PT Time Calculation (min) (ACUTE ONLY): 18 min  Charges:    $Therapeutic Activity: 8-22 mins PT General Charges $$ ACUTE PT VISIT: 1 Visit                     Donna Bernard, PT, MPT   Ina Homes 02/05/2023, 9:37 AM

## 2023-02-09 ENCOUNTER — Inpatient Hospital Stay: Payer: No Typology Code available for payment source | Admitting: Family

## 2023-02-10 ENCOUNTER — Ambulatory Visit: Payer: No Typology Code available for payment source | Admitting: Family

## 2023-02-10 ENCOUNTER — Encounter: Payer: Self-pay | Admitting: Family

## 2023-02-10 DIAGNOSIS — R0683 Snoring: Secondary | ICD-10-CM | POA: Diagnosis not present

## 2023-02-10 DIAGNOSIS — J441 Chronic obstructive pulmonary disease with (acute) exacerbation: Secondary | ICD-10-CM | POA: Diagnosis not present

## 2023-02-10 DIAGNOSIS — E039 Hypothyroidism, unspecified: Secondary | ICD-10-CM

## 2023-02-10 DIAGNOSIS — Z8679 Personal history of other diseases of the circulatory system: Secondary | ICD-10-CM | POA: Diagnosis not present

## 2023-02-10 DIAGNOSIS — I509 Heart failure, unspecified: Secondary | ICD-10-CM

## 2023-02-10 DIAGNOSIS — Z789 Other specified health status: Secondary | ICD-10-CM

## 2023-02-10 DIAGNOSIS — I10 Essential (primary) hypertension: Secondary | ICD-10-CM

## 2023-02-10 DIAGNOSIS — Z9981 Dependence on supplemental oxygen: Secondary | ICD-10-CM | POA: Insufficient documentation

## 2023-02-10 NOTE — Assessment & Plan Note (Signed)
Continue lasix 40 mg once daily  Reviewed cmp potassium normal limits will continue to monitor

## 2023-02-10 NOTE — Assessment & Plan Note (Signed)
Stable today.

## 2023-02-10 NOTE — Assessment & Plan Note (Signed)
Continue on eliquis 5 mg twice daily and restart asa 8 1mg  (pt states non compliance, but will start) No appt yet with cardiologist, pt states he will call today as soon as he is home from here.  Continue metoprolol 150 mg once daily as prescribed.

## 2023-02-10 NOTE — Patient Instructions (Addendum)
  Call to get scheduled with cardiology.   A referral was placed today for pulmonary as well for your oxygen and copd.  Please let us know if you have not heard back within 2 weeks about the referral.  Make sure you start taking your thyroid medication separate from food and or other medications by at least 30 min to one hour  Separate from vitamins and acid reducing medications by at least four hours.    Regards,   Mort Sawyers FNP-C

## 2023-02-10 NOTE — Progress Notes (Signed)
Established Patient Office Visit  Subjective:      CC:  Chief Complaint  Patient presents with   Hospitalization Follow-up    HPI: Ryan Hatfield is a 57 y.o. male presenting on 02/10/2023 for Hospitalization Follow-up . Went to ER at [facility] 7/25 (discharged 8/1) with c/o sob tachycardia and irregular HR. Had had intermittent chest pain. Was 87% on RA at our office, and HR was 156 during triage.  CTA chest: small right pleural effusion right lower lobe. Aortic atherosclerosis.  CXR: mild pulmonary vascular congestion, atelectasis right mid lung zone. Homogeneous opacity right lateral costophrenic angle (right pleural effusion confirmed on CTA) EKG- atrial flutter. Was placed on diltiazem drip. Was given lasix IV as well for possible CHF exacerbation. BNP mildly elevated. Elevated D dimer.  Cardioversion 02/05/23 with conversion to NSR  2D echo LV ejection fraction 55-60 % Cardiac cath 7/29 with insignificant stenosis of RCA and mid LAD.  - they did recommend outpatient sleep apnea study.  -acute on chronic HF, resolved via inpatient stay.  COPD: while inpatient was placed on home oxygen upon discharge.   Discharged with start on metoprolol eliquis 5 mg twice daily,  and also metoprolol 150 mg daily, and on lasix 40 mg once daily. On asaa 81 mg once daily as well.   F/u scheduled with cardiologist Dr. Azucena Cecil however not yet scheduled, he states he is going to call him today to get this set up.   Hypothyroid: still taking levothyroxine 50 mcg once daily.  Lab Results  Component Value Date   TSH 4.222 01/29/2023    New complaints: Currently without chest pain palpitations . Some sob but on oxygen. Currently on oxygen at 95%)  He is currently unable to work, his job is very physically demanding. He is on supplemental oxygen with DOE and sob even at rest. Is not currently able to stand or walk for long periods of time, and can not lift anything currently greater than 5  pounds as to not exert any increased physical demand. He has been unable to return to work since 7/22.    Wt Readings from Last 3 Encounters:  02/10/23 260 lb 8 oz (118.2 kg)  02/05/23 263 lb 12.8 oz (119.7 kg)  01/29/23 279 lb (126.6 kg)   Temp Readings from Last 3 Encounters:  02/10/23 97.8 F (36.6 C) (Temporal)  02/05/23 97.7 F (36.5 C) (Oral)  01/29/23 98 F (36.7 C)   BP Readings from Last 3 Encounters:  02/10/23 130/72  02/05/23 113/71  01/29/23 130/70   Pulse Readings from Last 3 Encounters:  02/10/23 86  02/05/23 80  01/29/23 (!) 136        Social history:  Relevant past medical, surgical, family and social history reviewed and updated as indicated. Interim medical history since our last visit reviewed.  Allergies and medications reviewed and updated.  DATA REVIEWED: CHART IN EPIC     ROS: Negative unless specifically indicated above in HPI.    Current Outpatient Medications:    albuterol (VENTOLIN HFA) 108 (90 Base) MCG/ACT inhaler, TAKE 2 PUFFS BY MOUTH EVERY 6 HOURS AS NEEDED FOR WHEEZE OR SHORTNESS OF BREATH (Patient taking differently: Inhale 2 puffs into the lungs See admin instructions. TAKE 2 PUFFS BY MOUTH EVERY 6 HOURS AS NEEDED FOR WHEEZE OR SHORTNESS OF BREATH), Disp: 8.5 each, Rfl: 1   apixaban (ELIQUIS) 5 MG TABS tablet, Take 1 tablet (5 mg total) by mouth 2 (two) times daily., Disp: 60  tablet, Rfl: 2   aspirin EC 81 MG tablet, Take 1 tablet (81 mg total) by mouth daily. Swallow whole., Disp: , Rfl:    atorvastatin (LIPITOR) 40 MG tablet, Take 1 tablet (40 mg total) by mouth daily., Disp: 30 tablet, Rfl: 5   folic acid (FOLVITE) 1 MG tablet, Take 1 tablet (1 mg total) by mouth daily., Disp: 90 tablet, Rfl: 1   furosemide (LASIX) 40 MG tablet, Take 1 tablet (40 mg total) by mouth daily., Disp: 60 tablet, Rfl: 2   levothyroxine (SYNTHROID) 50 MCG tablet, Take 1 tablet (50 mcg total) by mouth daily., Disp: 90 tablet, Rfl: 3   metoprolol  succinate (TOPROL XL) 50 MG 24 hr tablet, Take 3 tablets (150 mg total) by mouth daily. Take with or immediately following a meal., Disp: 90 tablet, Rfl: 2   nicotine (NICODERM CQ - DOSED IN MG/24 HOURS) 21 mg/24hr patch, Place 1 patch (21 mg total) onto the skin daily., Disp: 28 patch, Rfl: 0   tiotropium (SPIRIVA HANDIHALER) 18 MCG inhalation capsule, INHALE 1 CAPSULE VIA HANDIHALER ONCE DAILY AT THE SAME TIME EVERY DAY (Patient taking differently: Place 18 mcg into inhaler and inhale See admin instructions. INHALE 1 CAPSULE VIA HANDIHALER ONCE DAILY AT THE SAME TIME EVERY DAY), Disp: 30 capsule, Rfl: 5   traZODone (DESYREL) 50 MG tablet, TAKE 0.5-1 TABLETS BY MOUTH AT BEDTIME AS NEEDED FOR SLEEP., Disp: 90 tablet, Rfl: 2      Objective:    BP 130/72 (BP Location: Left Arm, Patient Position: Sitting, Cuff Size: Large)   Pulse 86   Temp 97.8 F (36.6 C) (Temporal)   Ht 5\' 9"  (1.753 m)   Wt 260 lb 8 oz (118.2 kg)   SpO2 95%   BMI 38.47 kg/m   Wt Readings from Last 3 Encounters:  02/10/23 260 lb 8 oz (118.2 kg)  02/05/23 263 lb 12.8 oz (119.7 kg)  01/29/23 279 lb (126.6 kg)    Physical Exam Constitutional:      General: He is not in acute distress.    Appearance: Normal appearance. He is normal weight. He is not ill-appearing, toxic-appearing or diaphoretic.  Cardiovascular:     Rate and Rhythm: Normal rate and regular rhythm. No extrasystoles are present. Pulmonary:     Effort: Pulmonary effort is normal.     Breath sounds: Normal breath sounds.     Comments: On cont oxygen  Musculoskeletal:        General: Normal range of motion.     Right lower leg: No edema.     Left lower leg: No edema.  Neurological:     General: No focal deficit present.     Mental Status: He is alert and oriented to person, place, and time. Mental status is at baseline.  Psychiatric:        Mood and Affect: Mood normal.        Behavior: Behavior normal.        Thought Content: Thought content  normal.        Judgment: Judgment normal.           Assessment & Plan:  Morbid obesity (HCC) -     Ambulatory referral to Sleep Studies -     Ambulatory referral to Pulmonology  Snoring -     Ambulatory referral to Sleep Studies  Chronic obstructive pulmonary disease with acute exacerbation (HCC) -     Ambulatory referral to Pulmonology  History of atrial flutter Assessment & Plan:  Continue on eliquis 5 mg twice daily and restart asa 8 1mg  (pt states non compliance, but will start) No appt yet with cardiologist, pt states he will call today as soon as he is home from here.  Continue metoprolol 150 mg once daily as prescribed.      Essential hypertension Assessment & Plan: Stable today    Acute on chronic congestive heart failure, unspecified heart failure type [facility]) Assessment & Plan: Continue lasix 40 mg once daily  Reviewed cmp potassium normal limits will continue to monitor    On supplemental oxygen by nasal cannula Assessment & Plan: With copd  Referral placed for pulmonary for ongoing care and evaluation/treatment  Today sats stable on cont oxygen   Orders: -     Ambulatory referral to Pulmonology  Acquired hypothyroidism Assessment & Plan: Continue levothyroxine 50 mcg once daily, pt was taken incorrectly, taking with food.  Advised pt to seperate from food and or other medications by at least 30 min to one hour  Separate from vitamins and acid reducing medications by at least four hours.  Goal TSH 1-2       Return in about 1 month (around 03/13/2023) for f/u copd and heart .  Mort Sawyers, MSN, APRN, FNP-C [facility]

## 2023-02-10 NOTE — Assessment & Plan Note (Signed)
With copd  Referral placed for pulmonary for ongoing care and evaluation/treatment  Today sats stable on cont oxygen

## 2023-02-10 NOTE — Assessment & Plan Note (Signed)
Continue levothyroxine 50 mcg once daily, pt was taken incorrectly, taking with food.  Advised pt to seperate from food and or other medications by at least 30 min to one hour  Separate from vitamins and acid reducing medications by at least four hours.  Goal TSH 1-2

## 2023-02-11 ENCOUNTER — Encounter: Payer: Self-pay | Admitting: Family

## 2023-02-11 ENCOUNTER — Telehealth: Payer: Self-pay

## 2023-02-11 DIAGNOSIS — R0902 Hypoxemia: Secondary | ICD-10-CM | POA: Insufficient documentation

## 2023-02-11 NOTE — Telephone Encounter (Signed)
-----   Message from [place] sent at 02/11/2023 12:39 PM EDT ----- Regarding: fmla FMLA completed and in outbox. Can we please print office note from 8/6 visit with me and attach to [facility] paperwork. Call pt to let him know ready for pick up, copy for scans, and let him know he also needs to complete his portion. Please ask if pt has called cardiology to have appt. I do not see an appointment on his chart scheduled. He said he was going to call yesterday.

## 2023-02-11 NOTE — Telephone Encounter (Signed)
Patient states he has not called Cardiology yet but is going to call now and schedule an appointment. Patient is aware that FMLA paperwork is ready and he has to fill out his portion.

## 2023-02-13 ENCOUNTER — Telehealth: Payer: Self-pay | Admitting: Family

## 2023-02-13 NOTE — Telephone Encounter (Signed)
Patient contacted the office regarding disability paperwork he picked up yesterday. States he was needing a note from pcp to excuse him from work for 2 months and it wasn't included with ppw. Patient is asking if Wyatt Mage could complete this letter for him and possibly today? He is wanting to turn in the disability ppw today if possible. Patient also wanted Tabitha to know he has set up appointments for both cardiology and pulmonary.

## 2023-02-16 ENCOUNTER — Other Ambulatory Visit: Payer: Self-pay | Admitting: Family

## 2023-02-16 NOTE — Telephone Encounter (Signed)
Called patient states that he needed letter. Patient states he came by this morning and was able to get that from our office. No further action needed.

## 2023-02-16 NOTE — Telephone Encounter (Signed)
Does the paperwork not say start 7/22 end 9/22?

## 2023-02-18 ENCOUNTER — Telehealth: Payer: Self-pay | Admitting: Family

## 2023-02-18 NOTE — Telephone Encounter (Signed)
Chelsea from [employer] the office regarding short term disability ppw submitted by patient. Leeroy Bock is requesting a call back regarding ppw, states she has a few follow up questions. Can be reached at [phone]

## 2023-02-19 ENCOUNTER — Encounter (INDEPENDENT_AMBULATORY_CARE_PROVIDER_SITE_OTHER): Payer: Self-pay

## 2023-02-19 NOTE — Telephone Encounter (Signed)
Called number below, no answer did not leave message voice mail did not have any message identifying who it was. Will call back later.

## 2023-02-23 NOTE — Telephone Encounter (Signed)
Reached out to [facility] to see if I could help her with any questions/concerns regarding pt's disability forms

## 2023-02-24 ENCOUNTER — Ambulatory Visit: Payer: No Typology Code available for payment source | Admitting: Pulmonary Disease

## 2023-02-24 ENCOUNTER — Ambulatory Visit
Admission: RE | Admit: 2023-02-24 | Discharge: 2023-02-24 | Disposition: A | Discharge status: Home / Self Care | Payer: No Typology Code available for payment source | Source: Ambulatory Visit | Attending: Pulmonary Disease | Admitting: Pulmonary Disease

## 2023-02-24 ENCOUNTER — Encounter: Payer: Self-pay | Admitting: Pulmonary Disease

## 2023-02-24 VITALS — BP 144/82 | HR 75 | Temp 98.0°F | Ht 69.0 in | Wt 263.2 lb

## 2023-02-24 DIAGNOSIS — Z720 Tobacco use: Secondary | ICD-10-CM | POA: Diagnosis not present

## 2023-02-24 DIAGNOSIS — J90 Pleural effusion, not elsewhere classified: Secondary | ICD-10-CM

## 2023-02-24 DIAGNOSIS — R0602 Shortness of breath: Secondary | ICD-10-CM | POA: Diagnosis not present

## 2023-02-24 NOTE — Telephone Encounter (Signed)
Spoke with Chelsea on 8/20/204/24@ 12:28pm regarding pt STD forms to let her know that pt has an upcoming F/U appt with Tabitha 04/02/2023 to she how pt is doing and where they will need to go from there before it will be determined when the pt will be able to return back to work. Chelsea understood and said that she will F/U with the office after that date.

## 2023-02-24 NOTE — Progress Notes (Signed)
Synopsis: Referred in by Mort Sawyers, FNP   Subjective:   PATIENT ID: Ryan Hatfield GENDER: male DOB: [birth year], MRN: [id number]  Chief Complaint  Patient presents with   pulmonary consult    Hx of COPD. SOB with exertion and occ wheezing.     HPI Ryan Hatfield is a 57 year old male patient with a past medical history of paroxysmal A-fib on Eliquis, hypothyroidism on Synthroid tobacco use disorder, presumed COPD presenting to the pulmonary clinic today for ongoing shortness of breath.  He was recently admitted to [facility] from 07/25-08/01 with shortness of breath lower extremity edema fatigue and lethargy.  He was found to be in CHF exacerbation with BNP 123 chest x-ray with pulmonary vascular congestion and right pleural effusion. Echocardiogram with EF 40 to 45% Diastology could not be assessed. CTA chest with right pleural effusion, increased contrast in hepatic veins.  He was diuresed aggressively and lost 30 pounds of fluids.  He felt well and was discharged on 2 L nasal cannula.  Today he reports feeling much better.  Edema has significantly improved.  He denies any cough but does report some mild intermittent wheezing.  He does report orthopnea and paroxysmal nocturnal dyspnea.  He is actively being evaluated for sleep apnea by his primary care.  Family history -he denies any family history of pulmonary diseases  Social history -smokes 5 cigarettes a day started at 57 years old.  Denies any alcohol or illicit drug use.  He lives with his brother and works in [employer].  ROS All systems were reviewed and are negative except for the above.  Objective:   Vitals:   02/24/23 0930  BP: (!) 144/82  Pulse: 75  Temp: 98 F (36.7 C)  TempSrc: Temporal  SpO2: 96%  Weight: 263 lb 3.2 oz (119.4 kg)  Height: 5\' 9"  (1.753 m)   96% on RA BMI Readings from Last 3 Encounters:  02/24/23 38.87 kg/m  02/10/23 38.47 kg/m  02/05/23 38.96 kg/m   Wt Readings from Last 3  Encounters:  02/24/23 263 lb 3.2 oz (119.4 kg)  02/10/23 260 lb 8 oz (118.2 kg)  02/05/23 263 lb 12.8 oz (119.7 kg)    Physical Exam GEN: NAD, Healthy Appearing HEENT: Supple Neck, Reactive Pupils, EOMI  CVS: Normal S1, Normal S2, RRR, No murmurs or ES appreciated  Lungs: Bibasilar crackles noted Abdomen: Soft, non tender, non distended, + BS  Extremities: Warm and well perfused, No edema  Skin: No suspicious lesions appreciated  Psych: Normal Affect  Ancillary Information   CBC    Component Value Date/Time   WBC 9.9 02/05/2023 0055   RBC 5.30 02/05/2023 0055   HGB 15.2 02/05/2023 0055   HCT 48.8 02/05/2023 0055   PLT 184 02/05/2023 0055   MCV 92.1 02/05/2023 0055   MCH 28.7 02/05/2023 0055   MCHC 31.1 02/05/2023 0055   RDW 13.8 02/05/2023 0055   LYMPHSABS 2.7 09/05/2021 0820   MONOABS 0.8 09/05/2021 0820   EOSABS 0.3 09/05/2021 0820   BASOSABS 0.1 09/05/2021 0820    Imaging  CXR 07/25: *Mild pulmonary vascular congestion. Probable small right pleural effusion with associated compressive atelectasis.   CTA Chest 01/29/2023:  No pneumothorax is noted. Small right pleural effusion is noted with adjacent atelectasis of the right lower lobe. Left lung is unremarkable.  TEE 02/04/2023:   1. Limited study, transgastric views not obtained as patient developed  hypoxia and procedure was aborted   2. Left ventricular ejection fraction, by estimation, is  40 to 45%. The  left ventricle has mildly decreased function.   3. Right ventricular systolic function is mildly reduced. The right  ventricular size is normal.   4. Left atrial size was mildly dilated. No left atrial/left atrial  appendage thrombus was detected.   5. Right atrial size was mildly dilated.   6. The mitral valve is normal in structure. Moderate mitral valve  regurgitation.   7. The aortic valve is tricuspid. Aortic valve regurgitation is trivial.        No data to display           Assessment &  Plan:  Ryan Hatfield is a 57 year old male patient with a past medical history of paroxysmal A-fib on Eliquis, hypothyroidism on Synthroid tobacco use disorder, presumed COPD presenting to the pulmonary clinic today for ongoing shortness of breath.  #Shortness of breath  #Presumed COPD   #HFpEF dry weigh 260lbs  #Pleural Effusion - Appears to be in the setting of Chf. In office [place] did not demonstrate any effusions  #Paroxysmal A.fib on eliquis  #Suspected OSA  #Tobacco use disorder   []  PFTs  []  6 min walk test []  CXR  []  c/w Tiotropium Bromide [Spiriva CAP] 1 Cap daily []  Albuterol Inh 2 puffs Q6H as needed  []  Enroll in LDCT scan program  []  Currently on furosemide 40mg  PO daily. Has an apt with cardiology on 03/02/2023  []  Actively being evaluated for OSA by PCP  Return in about 3 months (around 05/27/2023).  I spent 60 minutes caring for this patient today, including preparing to see the patient, obtaining a medical history , reviewing a separately obtained history, performing a medically appropriate examination and/or evaluation, ordering medications, tests, or procedures, documenting clinical information in the electronic health record, and independently interpreting results (not separately reported/billed) and communicating results to the patient/family/caregiver  Janann Colonel, MD [facility] 02/24/2023 10:13 AM

## 2023-03-02 ENCOUNTER — Encounter: Payer: Self-pay | Admitting: Cardiology

## 2023-03-02 ENCOUNTER — Ambulatory Visit: Payer: No Typology Code available for payment source | Attending: Cardiology | Admitting: Cardiology

## 2023-03-02 VITALS — BP 128/64 | HR 83 | Ht 69.0 in | Wt 263.0 lb

## 2023-03-02 DIAGNOSIS — I10 Essential (primary) hypertension: Secondary | ICD-10-CM | POA: Diagnosis not present

## 2023-03-02 DIAGNOSIS — F17200 Nicotine dependence, unspecified, uncomplicated: Secondary | ICD-10-CM

## 2023-03-02 DIAGNOSIS — I25118 Atherosclerotic heart disease of native coronary artery with other forms of angina pectoris: Secondary | ICD-10-CM

## 2023-03-02 DIAGNOSIS — I5033 Acute on chronic diastolic (congestive) heart failure: Secondary | ICD-10-CM

## 2023-03-02 DIAGNOSIS — E782 Mixed hyperlipidemia: Secondary | ICD-10-CM

## 2023-03-02 DIAGNOSIS — E039 Hypothyroidism, unspecified: Secondary | ICD-10-CM

## 2023-03-02 DIAGNOSIS — J449 Chronic obstructive pulmonary disease, unspecified: Secondary | ICD-10-CM

## 2023-03-02 DIAGNOSIS — Z8679 Personal history of other diseases of the circulatory system: Secondary | ICD-10-CM | POA: Diagnosis not present

## 2023-03-02 NOTE — Patient Instructions (Signed)
Medication Instructions:  Your physician recommends that you continue on your current medications as directed. Please refer to the Current Medication list given to you today.  *If you need a refill on your cardiac medications before your next appointment, please call your pharmacy*   Lab Work: Your provider would like for you to have following labs drawn today CBC, BMP, Mg.   If you have labs (blood work) drawn today and your tests are completely normal, you will receive your results only by: MyChart Message (if you have MyChart) OR A paper copy in the mail If you have any lab test that is abnormal or we need to change your treatment, we will call you to review the results.   Testing/Procedures: none   Follow-Up: At [facility], you and your health needs are our priority.  As part of our continuing mission to provide you with exceptional heart care, we have created designated Provider Care Teams.  These Care Teams include your primary Cardiologist (physician) and Advanced Practice Providers (APPs -  Physician Assistants and Nurse Practitioners) who all work together to provide you with the care you need, when you need it.  We recommend signing up for the patient portal called "MyChart".  Sign up information is provided on this After Visit Summary.  MyChart is used to connect with patients for Virtual Visits (Telemedicine).  Patients are able to view lab/test results, encounter notes, upcoming appointments, etc.  Non-urgent messages can be sent to your provider as well.   To learn more about what you can do with MyChart, go to [email or web address].    Your next appointment:   6 week(s)  Provider:   Charlsie Quest, NP

## 2023-03-02 NOTE — Progress Notes (Signed)
Cardiology Office Note:  .   Date:  03/02/2023  ID:  Ryan Hatfield, DOB 1965/08/21, MRN [id number] PCP: Ryan Sawyers, FNP  [facility] Providers Cardiologist:  Debbe Odea, MD    History of Present Illness: .   Ryan Hatfield is a 57 y.o. male with past medical history of hypertension, hyperlipidemia, current smoker 40+ years, alcohol abuse in remission, anxiety and depression, new onset atrial flutter s/p TEE/DCCV (02/03/23), COPD, HFpEF, morbid obesity, who is here today for hospital follow up.  He had previously been seen by Dr.Agbor-Etang 02/17/2022 for the evaluation of chest pain.  He describes chest pain and shortness of breath been ongoing for several months.  At that time he was scheduled for an echocardiogram and a coronary CTA.  He was also ordered fasting lipid panel and smoking cessation was advised.  There was also a referral was made to pulmonary medicine for snoring, daytime fatigue, somnolence and findings that were consistent with OSA.  Fortunately coronary CTA was never completed but he did undergo the echocardiogram which revealed an LVEF of 60 to 65%, no regional wall motion abnormalities, mild left ventricular hypertrophy, G1 DD, without valvular abnormalities.  He presented to the [facility] emergency department on 01/29/2023 with worsening lower extremity edema to his bilateral lower extremities.  Has been continued on chronic Lasix but seem to have gotten worse over the last week even with him taking extra doses of his Lasix his legs did not improve.  He noted increased shortness of breath as well as associated PND and orthopnea.  This was in addition to weight gain.  He stated he tried a cold and time that he lost some weight.  He had also been having recent chest pain that occurs 45 minutes at a time daily associated with nausea and diaphoresis.  He has not taken anything for the chest discomfort.  It came on with rest and with exertion he denied any aggravating or  alleviating factors.  Continues to smoke about 1 and half pack per cigarettes per day.  EKG revealed atrial flutter with RVR 156 bpm.  He was hypoxic with oxygen saturations on room air of 87%.  He was started on oxygen as well as IV Cardizem drip.  BNP was mildly elevated at 123 and high-sensitivity troponin trended 20 in the 19.  Serum creatinine was 1.13, potassium 4.1, hemoglobin 16.  Chest x-ray revealed mild vascular congestion and small pleural effusion.  He was started on heparin infusion and will be transitioned to apixaban prior to discharge and was scheduled for a heart catheterization.  IV Cardizem was able to be stopped and he was started on metoprolol 12.5 mg twice daily.  Echocardiogram revealed an LVEF estimation of 55-60% with normal function.  There were no regional wall motion abnormalities, trivial mitral valve regurgitation.  TSH which was 4.2.  Right and left heart catheterization completed on 02/02/2023 that showed mild assessment of nonobstructive coronary artery disease with moderate elevation of right heart pressures and moderate pulmonary hypertension with mean PA pressure 39 mmHg.  Weight loss and smoking cessation is essential.  At the time he was evaluated by EP for potential further need for ablation.  Plans for resumption of anticoagulation therapy after TEE/DCCV was completed.  He was transitioned back to his oral medications but is considered stable and discharged on 02/06/2023.  He returns to clinic today with continued complaint of fatigue, shortness of breath, chronic chest discomfort that he states is unchanged.  He does  endorse that the swelling has improved to his bilateral lower extremities.  Denies any bleeding and no noted blood in his urine or stool.  As he has been continued on apixaban 5 mg twice daily for recent diagnoses of atrial flutter.  He is continued to smoke but states that he continues to work on decreasing the amount that he smokes daily.  States that he has  been taking his medications.  Has been continued on oxygen at 2 L of O2 via nasal cannula and has recently followed up with pulmonary who has upcoming test ordered.  He has yet to follow-up with his PCP.   ROS: 10 point review of systems has been completed and considered negative with exception within the HPI  Studies Reviewed: Marland Kitchen   EKG Interpretation Date/Time:  Monday March 02 2023 14:34:35 EDT Ventricular Rate:  83 PR Interval:  142 QRS Duration:  96 QT Interval:  392 QTC Calculation: 460 R Axis:   72  Text Interpretation: Normal sinus rhythm Normal ECG When compared with ECG of 04-Feb-2023 11:05, No significant change was found Confirmed by Ryan Hatfield ([id number]) on 03/02/2023 2:49:33 PM   TEE 02/04/23 1. Limited study, transgastric views not obtained as patient developed  hypoxia and procedure was aborted   2. Left ventricular ejection fraction, by estimation, is 40 to 45%. The  left ventricle has mildly decreased function.   3. Right ventricular systolic function is mildly reduced. The right  ventricular size is normal.   4. Left atrial size was mildly dilated. No left atrial/left atrial  appendage thrombus was detected.   5. Right atrial size was mildly dilated.   6. The mitral valve is normal in structure. Moderate mitral valve  regurgitation.   7. The aortic valve is tricuspid. Aortic valve regurgitation is trivial.   R/LHC 02/02/23   Prox RCA to Mid RCA lesion is 40% stenosed.   Mid LAD lesion is 20% stenosed.   Recommend to resume Apixaban, at currently prescribed dose and frequency.   Recommend concurrent antiplatelet therapy of Aspirin 81 mg daily.   Moderate elevation of right heart pressures with moderate pulmonary hypertension and mean PA pressure 39 mmHg.   Mild smooth nonobstructive CAD.   RECOMMENDATION: Patient was in atrial flutter variable rate during the procedure.  Plans will be for resumption of anticoagulation therapy with probable TEE guided  cardioversion.  Patient has been evaluated by EP for potential future need for ablation.  Weight loss and smoking cessation is essential.  TTE 01/30/23  1. LVEF is normal Cannot fully evaluate regional wall motion as  endocardium is not well seen. . Left ventricular ejection fraction, by  estimation, is 55 to 60%. The left ventricle has normal function. The left  ventricle has no regional wall motion  abnormalities. Left ventricular diastolic parameters are indeterminate.   2. Right ventricular systolic function is normal. The right ventricular  size is normal.   3. Trivial mitral valve regurgitation.   4. The aortic valve is tricuspid. Aortic valve regurgitation is not  visualized. Aortic valve sclerosis/calcification is present, without any  evidence of aortic stenosis.   5. The inferior vena cava is normal in size with greater than 50%  respiratory variability, suggesting right atrial pressure of 3 mmHg.   TTE 02/25/22 1. Left ventricular ejection fraction, by estimation, is 60 to 65%. The  left ventricle has normal function. The left ventricle has no regional  wall motion abnormalities. There is mild left ventricular hypertrophy.  LVOT gradient  with valsalva 29 mm Hg.  Left ventricular diastolic parameters are consistent with Grade I  diastolic dysfunction (impaired relaxation).   2. Right ventricular systolic function is normal. The right ventricular  size is normal.   3. Left atrial size was mildly dilated.   4. The mitral valve is normal in structure. No evidence of mitral valve  regurgitation. No evidence of mitral stenosis.   5. The aortic valve is normal in structure. Aortic valve regurgitation is  not visualized. No aortic stenosis is present.   6. The inferior vena cava is normal in size with greater than 50%  respiratory variability, suggesting right atrial pressure of 3 mmHg.  Risk Assessment/Calculations:    CHA2DS2-VASc Score = 2   This indicates a 2.2% annual risk  of stroke. The patient's score is based upon: CHF History: 1 HTN History: 1 Diabetes History: 0 Stroke History: 0 Vascular Disease History: 0 Age Score: 0 Gender Score: 0         Physical Exam:   VS:  BP 128/64 (BP Location: Left Arm, Patient Position: Sitting, Cuff Size: Large)   Pulse 83   Ht 5\' 9"  (1.753 m)   Wt 263 lb (119.3 kg)   SpO2 90%   BMI 38.84 kg/m    Wt Readings from Last 3 Encounters:  03/02/23 263 lb (119.3 kg)  02/24/23 263 lb 3.2 oz (119.4 kg)  02/10/23 260 lb 8 oz (118.2 kg)    GEN: Well nourished, well developed in no acute distress NECK: Unable to assess JVD due to body habitus; No carotid bruits CARDIAC: RRR, no murmurs, rubs, gallops RESPIRATORY:  Clear upper lobes with diminished bases to auscultation, respirations are unlabored at rest on 2L of O2 via [place] ABDOMEN: Soft, non-tender, obese, non-distended EXTREMITIES:  1+ edema to BLE; No deformity   ASSESSMENT AND PLAN: .   A new onset atrial flutter with RVR during recent hospitalization where he underwent TEE and DCCV.  Has been maintaining sinus rhythm since his procedure.  EKG today revealed sinus rhythm with a rate of 83.  He has been continued on apixaban 5 mg twice daily for CHA2DS2-VASc score of least 2 for stroke prophylaxis.  He denies any bleeding and no noted blood in his stool or urine.  He subsequently stopped his aspirin 81 mg daily to decrease the risk of bleeding while on apixaban.  He is also continued on Toprol-XL 150 mg daily.  HFpEF with LVEF of 55 to 60% on TTE and then noted drop in EF on TEE 40-45%.  At the time of his studies unfortunately he was in atrial flutter.  Will repeat a limited study on return.  He has chronic shortness of breath but improved swelling today.  He is continued on furosemide 40 mg daily and metoprolol XL.  If he continues to have decreased EF we will slowly escalate GDMT as tolerated by kidney function and blood pressure.  Scheduled for CBC, BMP, and mag today  posthospitalization.  Primary hypertension with blood pressure today 128/64.  Patient previously been on 100 mg of losartan that was discontinued during his hospitalization to allow for up titration of his metoprolol succinate when he was continued on 150 mg daily.  He has been encouraged to continue to monitor his blood pressures 1 to 2 hours post medication ministration.  Nonobstructive coronary artery disease noted on right and left heart catheterization completed 02/02/2023 and [facility].  Patient encouraged to restart aspirin 81 mg daily along with apixaban as recommended  post heart catheterization.  He is also continued on atorvastatin 40 mg daily.  EKG today reveals a sinus rhythm with a rate of 83 with no ischemic changes noted.  No further testing needed at this time.  Mixed hyperlipidemia with an LDL of 79 on 01/30/2023 with an LP(a) of 13.9.  He is continued on atorvastatin 40 mg daily.  Will need repeat lipid panel in 10 to 12 weeks.  Hypothyroidism where he is continued on levothyroxine 50 mcg daily.  This continues to be managed by his PCP.  COPD on chronic oxygen therapy with continued tobacco abuse.  He states he continues to smoke 4 to 5 cigarettes/day.  Continues on NicoDerm patch.  Encouraged to continue to work on his smoking cessation.  I also continues to follow-up with pulmonary.  Morbid obesity with a BMI of 38.8.  He has been encouraged to continue to decrease his caloric intake and increase his activity as tolerated elevated BMI makes treatment and prognoses more difficult.        Dispo: Patient to return to clinic to see MD/APP in 6 weeks or sooner if needed.  Signed, Lajune Perine, NP

## 2023-03-03 ENCOUNTER — Encounter: Payer: Self-pay | Admitting: Pulmonary Disease

## 2023-03-03 LAB — CBC
Hematocrit: 51.6 % — ABNORMAL HIGH (ref 37.5–51.0)
Hemoglobin: 16.2 g/dL (ref 13.0–17.7)
MCH: 28.2 pg (ref 26.6–33.0)
MCHC: 31.4 g/dL — ABNORMAL LOW (ref 31.5–35.7)
MCV: 90 fL (ref 79–97)
Platelets: 199 10*3/uL (ref 150–450)
RBC: 5.74 x10E6/uL (ref 4.14–5.80)
RDW: 14.5 % (ref 11.6–15.4)
WBC: 10.1 10*3/uL (ref 3.4–10.8)

## 2023-03-03 LAB — BASIC METABOLIC PANEL
BUN/Creatinine Ratio: 15 (ref 9–20)
BUN: 15 mg/dL (ref 6–24)
CO2: 27 mmol/L (ref 20–29)
Calcium: 9.5 mg/dL (ref 8.7–10.2)
Chloride: 98 mmol/L (ref 96–106)
Creatinine, Ser: 1.03 mg/dL (ref 0.76–1.27)
Glucose: 111 mg/dL — ABNORMAL HIGH (ref 70–99)
Potassium: 3.9 mmol/L (ref 3.5–5.2)
Sodium: 142 mmol/L (ref 134–144)
eGFR: 84 mL/min/{1.73_m2} (ref >59)

## 2023-03-03 LAB — MAGNESIUM: Magnesium: 1.7 mg/dL (ref 1.6–2.3)

## 2023-03-04 NOTE — Progress Notes (Signed)
Blood counts remain stable on blood thinner.  Blood glucose was slightly elevated.  Kidney function and other electrolytes have remained stable.  Magnesium is slightly low.  Recommend increasing dietary intake of high magnesium foods of spinach, green leafy vegetables, and whole grains.

## 2023-03-10 ENCOUNTER — Telehealth: Payer: Self-pay | Admitting: Family

## 2023-03-10 NOTE — Telephone Encounter (Signed)
Prescription Request  03/10/2023  LOV: 02/10/2023  What is the name of the medication or equipment?  apixaban (ELIQUIS) 5 MG TABS tablet  metoprolol succinate (TOPROL XL) 50 MG 24 hr tablet  furosemide (LASIX) 40 MG tablet   folic acid (FOLVITE) 1 MG tablet   Have you contacted your pharmacy to request a refill? No   Which pharmacy would you like this sent to?  [id number] [place], [place] - [address] Phone: [phone]  Fax: [phone]     These medications are showing at the [facility]  Patient states he is out of medication  Patient is not going to [facility] to pick up these medications as he has been out of work for the last month. He has asked that they be called in to [facility]

## 2023-03-11 NOTE — Telephone Encounter (Signed)
All have been refilled by another provider on 02/05/23.  No further action needed at this time.

## 2023-03-12 ENCOUNTER — Telehealth: Payer: Self-pay | Admitting: Cardiology

## 2023-03-12 DIAGNOSIS — I10 Essential (primary) hypertension: Secondary | ICD-10-CM

## 2023-03-12 MED ORDER — APIXABAN 5 MG PO TABS: 5.0000 mg | ORAL_TABLET | Freq: Two times a day (BID) | ORAL | 2 refills | Status: DC

## 2023-03-12 MED ORDER — FUROSEMIDE 40 MG PO TABS: 40.0000 mg | ORAL_TABLET | Freq: Every day | ORAL | 2 refills | Status: DC

## 2023-03-12 MED ORDER — METOPROLOL SUCCINATE ER 50 MG PO TB24: 150.0000 mg | ORAL_TABLET | Freq: Every day | ORAL | 2 refills | Status: DC

## 2023-03-12 NOTE — Telephone Encounter (Signed)
Noted  

## 2023-03-12 NOTE — Telephone Encounter (Signed)
Pt c/o medication issue:  1. Name of Medication: metoprolol succinate (TOPROL XL) 50 MG 24 hr tablet furosemide (LASIX) 40 MG tablet apixaban (ELIQUIS) 5 MG TABS tablet  2. How are you currently taking this medication (dosage and times per day)? As written   3. Are you having a reaction (difficulty breathing--STAT)? No  4. What is your medication issue? Patient is calling because these medications were sent to the [facility]. [facility] [place]. Patient is currently out of the medications and hasn't had any for 3 days now.

## 2023-03-12 NOTE — Telephone Encounter (Signed)
Advised Patients that [facility]. Patient has no further questions or complaints.

## 2023-03-24 ENCOUNTER — Ambulatory Visit: Payer: No Typology Code available for payment source

## 2023-04-02 ENCOUNTER — Encounter: Payer: Self-pay | Admitting: Family

## 2023-04-02 ENCOUNTER — Ambulatory Visit (INDEPENDENT_AMBULATORY_CARE_PROVIDER_SITE_OTHER): Payer: No Typology Code available for payment source | Admitting: Family

## 2023-04-02 VITALS — BP 126/64 | HR 78 | Temp 97.7°F | Ht 69.0 in | Wt 274.0 lb

## 2023-04-02 DIAGNOSIS — Z720 Tobacco use: Secondary | ICD-10-CM | POA: Insufficient documentation

## 2023-04-02 DIAGNOSIS — M545 Low back pain, unspecified: Secondary | ICD-10-CM

## 2023-04-02 DIAGNOSIS — I10 Essential (primary) hypertension: Secondary | ICD-10-CM

## 2023-04-02 DIAGNOSIS — G8929 Other chronic pain: Secondary | ICD-10-CM

## 2023-04-02 DIAGNOSIS — F17200 Nicotine dependence, unspecified, uncomplicated: Secondary | ICD-10-CM | POA: Insufficient documentation

## 2023-04-02 DIAGNOSIS — M79643 Pain in unspecified hand: Secondary | ICD-10-CM

## 2023-04-02 DIAGNOSIS — Z1211 Encounter for screening for malignant neoplasm of colon: Secondary | ICD-10-CM | POA: Diagnosis not present

## 2023-04-02 DIAGNOSIS — Z0001 Encounter for general adult medical examination with abnormal findings: Secondary | ICD-10-CM | POA: Diagnosis not present

## 2023-04-02 DIAGNOSIS — Z0000 Encounter for general adult medical examination without abnormal findings: Secondary | ICD-10-CM | POA: Insufficient documentation

## 2023-04-02 LAB — MICROALBUMIN / CREATININE URINE RATIO
Creatinine,U: 50.3 mg/dL
Microalb Creat Ratio: 3.1 mg/g (ref 0.0–30.0)
Microalb, Ur: 1.6 mg/dL (ref 0.0–1.9)

## 2023-04-02 MED ORDER — GABAPENTIN 100 MG PO CAPS: 100.0000 mg | ORAL_CAPSULE | Freq: Three times a day (TID) | ORAL | 3 refills | Status: DC

## 2023-04-02 MED ORDER — NICOTINE 14 MG/24HR TD PT24: 14.0000 mg | MEDICATED_PATCH | Freq: Every day | TRANSDERMAL | 0 refills | Status: DC

## 2023-04-02 NOTE — Assessment & Plan Note (Signed)
Patient Counseling(The following topics were reviewed): ? Preventative care handout given to pt  ?Health maintenance and immunizations reviewed. Please refer to Health maintenance section. ?Pt advised on safe sex, wearing seatbelts in car, and proper nutrition ?labwork ordered today for annual ?Dental health: Discussed importance of regular tooth brushing, flossing, and dental visits. ? ? ?

## 2023-04-02 NOTE — Patient Instructions (Addendum)
Gabapentin, you will start one tablet at night time for three days, then increase to twice daily for three days then increased to three times daily if tolerating well.   A referral was placed today for colonoscopy. Please let us know if you have not heard back within 2 weeks about the referral.   Regards,   Sheehan Stacey FNP-C

## 2023-04-06 DIAGNOSIS — G8929 Other chronic pain: Secondary | ICD-10-CM | POA: Insufficient documentation

## 2023-04-06 DIAGNOSIS — M545 Low back pain, unspecified: Secondary | ICD-10-CM | POA: Insufficient documentation

## 2023-04-06 NOTE — Assessment & Plan Note (Signed)
Smoking cessation instruction/counseling given:  counseled patient on the dangers of tobacco use, advised patient to stop smoking, and reviewed strategies to maximize success  Sending in nicotine patch 14 mg every day

## 2023-04-06 NOTE — Assessment & Plan Note (Signed)
Trial gabapentin as pt states helped him in the past. Reviewed kidney function.  Trial gabapentin 100 mg qhs

## 2023-04-09 ENCOUNTER — Telehealth: Payer: Self-pay | Admitting: Family

## 2023-04-09 NOTE — Telephone Encounter (Signed)
Ok, can you advise him to please bring [place] a blank copy since we can not update the prior?

## 2023-04-09 NOTE — Telephone Encounter (Signed)
Patient called in and stated that he was suppose to get some updated FMLA paperwork. He wanted to know if this was done yet. Thank you!

## 2023-04-09 NOTE — Telephone Encounter (Signed)
Spoke with pt. He is aware of this information and will get [place] the forms as soon as he can.

## 2023-04-13 NOTE — Telephone Encounter (Signed)
Patient called in stating that the insurance company told him that he needs a an doctor statement stating what's wrong with him ,and to extend the date.

## 2023-04-14 ENCOUNTER — Encounter: Payer: Self-pay | Admitting: Cardiology

## 2023-04-14 ENCOUNTER — Ambulatory Visit: Payer: No Typology Code available for payment source | Attending: Cardiology | Admitting: Cardiology

## 2023-04-14 VITALS — BP 124/54 | HR 76 | Ht 69.0 in | Wt 281.2 lb

## 2023-04-14 DIAGNOSIS — E039 Hypothyroidism, unspecified: Secondary | ICD-10-CM

## 2023-04-14 DIAGNOSIS — I25118 Atherosclerotic heart disease of native coronary artery with other forms of angina pectoris: Secondary | ICD-10-CM

## 2023-04-14 DIAGNOSIS — I10 Essential (primary) hypertension: Secondary | ICD-10-CM

## 2023-04-14 DIAGNOSIS — Z8679 Personal history of other diseases of the circulatory system: Secondary | ICD-10-CM | POA: Diagnosis not present

## 2023-04-14 DIAGNOSIS — I5032 Chronic diastolic (congestive) heart failure: Secondary | ICD-10-CM | POA: Diagnosis not present

## 2023-04-14 DIAGNOSIS — E782 Mixed hyperlipidemia: Secondary | ICD-10-CM | POA: Diagnosis not present

## 2023-04-14 DIAGNOSIS — J449 Chronic obstructive pulmonary disease, unspecified: Secondary | ICD-10-CM

## 2023-04-14 MED ORDER — ISOSORBIDE MONONITRATE ER 30 MG PO TB24: 15.0000 mg | ORAL_TABLET | Freq: Every day | ORAL | 3 refills | Status: DC

## 2023-04-14 NOTE — Telephone Encounter (Signed)
I have sent the completed note to his mychart. He might need it printed for pick up, IDK if he will know what to do w mychart document.

## 2023-04-14 NOTE — Progress Notes (Signed)
Cardiology Office Note:  .   Date:  04/14/2023  ID:  Ryan Hatfield, DOB May 21, 1966, MRN [id number] PCP: Mort Sawyers, FNP  [facility] Providers Cardiologist:  Debbe Odea, MD Electrophysiologist:  Lanier Prude, MD    History of Present Illness: .   Ryan Hatfield is a 57 y.o. male with a past medical history of hypertension, hyperlipidemia, current smoker 40+ years, alcohol abuse in remission, anxiety depression, new onset atrial flutter status post TEE/DCCV (02/03/2023), COPD, HFpEF, morbid obesity, who is here today for follow-up.  Previous echocardiogram reveals LVEF 60 to 65%, no RWMA, mild left atrial hypertrophy, G1 DD, without valvular abnormalities.  Repeat echocardiogram revealed LVEF 55 to 60% with normal function, no RWMA, trivial mitral valve regurgitation.  He presented to [facility] emergency department 01/29/2023 with lower extremity edema and chronic shortness of breath.  He was noted to be in atrial fibrillation with RVR was placed on Cardizem drip.  Underwent TEE/DCCV and was able to be discharged from the facility on 02/06/2023.  He was last seen in clinic 03/02/2023 with continued complaints of fatigue, shortness of breath, chronic chest discomfort that he states is unchanged.  He was continued on apixaban 5 mg twice daily for CHA2DS2-VASc of at least 2 for stroke prophylaxis and Toprol-XL 50 mg daily.  He was scheduled for labs posthospitalization.  No further testing was ordered.  He returns to clinic today with continued shortness of breath, occasional chest discomfort he describes as a stabbing type pain that can be associated with rest or exertion.  He also continues to have peripheral edema to his bilateral lower extremities that has improved since being on furosemide.  Continues on oxygen 2 L via [place].-He has been compliant with his current medication regimen without missing any of his apixaban.  Also denies any instances of bleeding with no blood noticed in  his urine or stool.  States that he has upcoming pulmonary function studies ordered.  He also is continue to follow-up with his PCP with having his FMLA papers completed.  He also has questions about disability today.  ROS: 10 point review of systems has been reviewed and considered negative with exception was been listed in the HPI  Studies Reviewed: Marland Kitchen   EKG Interpretation Date/Time:  Tuesday April 14 2023 10:38:48 EDT Ventricular Rate:  76 PR Interval:  152 QRS Duration:  90 QT Interval:  378 QTC Calculation: 425 R Axis:   75  Text Interpretation: Normal sinus rhythm Normal ECG When compared with ECG of 02-Mar-2023 14:34, No significant change was found Confirmed by Charlsie Quest ([id number]) on 04/14/2023 10:48:18 AM    TEE 02/04/23 1. Limited study, transgastric views not obtained as patient developed  hypoxia and procedure was aborted   2. Left ventricular ejection fraction, by estimation, is 40 to 45%. The  left ventricle has mildly decreased function.   3. Right ventricular systolic function is mildly reduced. The right  ventricular size is normal.   4. Left atrial size was mildly dilated. No left atrial/left atrial  appendage thrombus was detected.   5. Right atrial size was mildly dilated.   6. The mitral valve is normal in structure. Moderate mitral valve  regurgitation.   7. The aortic valve is tricuspid. Aortic valve regurgitation is trivial.    R/LHC 02/02/23   Prox RCA to Mid RCA lesion is 40% stenosed.   Mid LAD lesion is 20% stenosed.   Recommend to resume Apixaban, at currently prescribed dose and frequency.  Recommend concurrent antiplatelet therapy of Aspirin 81 mg daily.   Moderate elevation of right heart pressures with moderate pulmonary hypertension and mean PA pressure 39 mmHg.   Mild smooth nonobstructive CAD.   RECOMMENDATION: Patient was in atrial flutter variable rate during the procedure.  Plans will be for resumption of anticoagulation therapy with  probable TEE guided cardioversion.  Patient has been evaluated by EP for potential future need for ablation.  Weight loss and smoking cessation is essential.   TTE 01/30/23  1. LVEF is normal Cannot fully evaluate regional wall motion as  endocardium is not well seen. . Left ventricular ejection fraction, by  estimation, is 55 to 60%. The left ventricle has normal function. The left  ventricle has no regional wall motion  abnormalities. Left ventricular diastolic parameters are indeterminate.   2. Right ventricular systolic function is normal. The right ventricular  size is normal.   3. Trivial mitral valve regurgitation.   4. The aortic valve is tricuspid. Aortic valve regurgitation is not  visualized. Aortic valve sclerosis/calcification is present, without any  evidence of aortic stenosis.   5. The inferior vena cava is normal in size with greater than 50%  respiratory variability, suggesting right atrial pressure of 3 mmHg.    TTE 02/25/22 1. Left ventricular ejection fraction, by estimation, is 60 to 65%. The  left ventricle has normal function. The left ventricle has no regional  wall motion abnormalities. There is mild left ventricular hypertrophy.  LVOT gradient with valsalva 29 mm Hg.  Left ventricular diastolic parameters are consistent with Grade I  diastolic dysfunction (impaired relaxation).   2. Right ventricular systolic function is normal. The right ventricular  size is normal.   3. Left atrial size was mildly dilated.   4. The mitral valve is normal in structure. No evidence of mitral valve  regurgitation. No evidence of mitral stenosis.   5. The aortic valve is normal in structure. Aortic valve regurgitation is  not visualized. No aortic stenosis is present.   6. The inferior vena cava is normal in size with greater than 50%  respiratory variability, suggesting right atrial pressure of 3 mmHg.  Risk Assessment/Calculations:    CHA2DS2-VASc Score = 2   This  indicates a 2.2% annual risk of stroke. The patient's score is based upon: CHF History: 1 HTN History: 1 Diabetes History: 0 Stroke History: 0 Vascular Disease History: 0 Age Score: 0 Gender Score: 0            Physical Exam:   VS:  BP (!) 124/54 (BP Location: Left Arm, Patient Position: Sitting, Cuff Size: Large)   Pulse 76   Ht 5\' 9"  (1.753 m)   Wt 281 lb 3.2 oz (127.6 kg)   SpO2 94%   BMI 41.53 kg/m    Wt Readings from Last 3 Encounters:  04/14/23 281 lb 3.2 oz (127.6 kg)  04/02/23 274 lb (124.3 kg)  03/02/23 263 lb (119.3 kg)    GEN: Well nourished, well developed in no acute distress NECK: Unable to assess JVD due to body habitus; No carotid bruits CARDIAC: RRR, no murmurs, rubs, gallops RESPIRATORY:  Clear with diminished bases to auscultation without rales, wheezing or rhonchi, respirations are unlabored at rest on 2 L of O2 via nasal cannula ABDOMEN: Soft, non-tender, obese, non-distended EXTREMITIES:  1+ edema; No deformity   ASSESSMENT AND PLAN: .   Atrial flutter status post TEE/DCCV.  Continues to maintain sinus rhythm with sinus rhythm noted on EKG today.  He has been also continued on apixaban 5 mg twice daily for CHA2DS2-VASc score of at least 2 for stroke prophylaxis.  Unfortunately previously been advised to restart aspirin therapy which she has not done as of yet.  He has also continued on Toprol-XL 150 mg daily.  He has stated that he has not missed any of his apixaban and denies any bleeding with no blood noted in his urine or stool.  Chronic HFpEF with an LVEF 55 to 60% on echocardiogram.  TEE revealed reduced heart function.  At the time of the study was in atrial flutter.  He has been scheduled for repeat limited echocardiogram to reevaluate function.  He is continued on furosemide 40 mg daily metoprolol XL 150 mg daily.  Will consider the addition of MRA and SGLT2 inhibitor on return.  Hypertension with blood pressure today 120/24.  Blood pressures remain  stable.  He is continued on 150 mg of Toprol-XL.  He has been encouraged to monitor his blood pressures 1 to 2 hours postmedication administration.  Mixed hyperlipidemia with an LDL of 79 on 01/30/2023 with an LP(a) of 13.9.  He has been continued on atorvastatin 40 mg daily.  It is scheduled for repeat lipid and hepatic functions.  Nonobstructive coronary artery disease noted on right and left heart catheterization completed 02/02/2023 at [facility].  Patient has been encouraged to restart aspirin 81 mg with his apixaban as recommendation post heart catheterization.  He is continued on atorvastatin 40 mg daily.  EKG today reveals sinus rhythm with no ischemic changes noted.  With his continued complaint of chest discomfort he has been started on Imdur 15 mg daily.  Hypothyroidism is continued on levothyroxine.  This continues to be managed by his PCP.  COPD on chronic oxygen therapy with continued tobacco abuse Ray continues to smoke 5 to 6 cigarettes a day.  He does have NicoDerm patch.  Is maintained on 2 L of O2 via nasal cannula.  He also has upcoming PFTs scheduled with pulmonary.  Morbid obesity with a BMI of 41.53 which complicates his prognoses.  He has been encouraged to increase his activity as tolerated and decrease his caloric intake.       Dispo: Patient return to clinic to see MD/APP in 6 weeks or sooner if needed for reevaluation of symptoms.  He also has upcoming appointment with the EP  Signed, Briahna Pescador, NP

## 2023-04-14 NOTE — Progress Notes (Unsigned)
  Electrophysiology Office Follow up Visit Note:    Date:  04/15/2023   ID:  Ryan Hatfield, DOB [birth year], MRN [id number]  PCP:  Mort Sawyers, FNP  [facility] Cardiologist:  Debbe Odea, MD  [facility] Electrophysiologist:  Lanier Prude, MD    Interval History:    Ryan Hatfield is a 57 y.o. male who presents for a follow up visit.   Last seen by Dr. Elberta Fortis February 01, 2023 when he was hospitalized for symptomatic atrial flutter with rapid ventricular rates. He was most recently seen by Hedy Camara on April 14, 2023.  The patient has a history of hypertension, hyperlipidemia, tobacco abuse, prior alcohol use, anxiety and depression, atrial flutter, COPD, chronic diastolic heart failure, obesity.  The patient takes Eliquis for stroke prophylaxis.  At the appointment with Cordelia Pen, the patient reported continued shortness of breath and occasional chest discomfort.  He uses oxygen via nasal cannula.  Today he tells me he uses oxygen around-the-clock.  He feels chronically short of breath.    Past medical, surgical, social and family history were reviewed.  ROS:   Please see the history of present illness.    All other systems reviewed and are negative.  EKGs/Labs/Other Studies Reviewed:    The following studies were reviewed today:  February 04, 2023 transesophageal echo EF 40 to 45% RV function mildly reduced Mildly dilated left atrium Mildly dilated right atrium Moderate MR  February 02, 2023 right heart catheterization/left heart catheterization No obstructive CAD Moderate elevation of right heart pressures, moderate pulmonary hypertension, mean PA pressure 39  January 29, 2023 EKG shows typical appearing atrial flutter  March 02, 2023 EKG shows sinus rhythm  April 14, 2023 EKG shows sinus rhythm      Physical Exam:    VS:  BP 112/60 (BP Location: Left Arm, Patient Position: Sitting, Cuff Size: Large)   Pulse 78   Ht 5\' 9"  (1.753 m)   Wt 279 lb  4 oz (126.7 kg)   SpO2 97%   BMI 41.24 kg/m     Wt Readings from Last 3 Encounters:  04/15/23 279 lb 4 oz (126.7 kg)  04/14/23 281 lb 3.2 oz (127.6 kg)  04/02/23 274 lb (124.3 kg)     GEN: Obese.  Chronically ill-appearing.  Appears older than stated age.   CARDIAC: RRR, no murmurs, rubs, gallops RESPIRATORY:  Clear to auscultation without rales, wheezing or rhonchi.  On supplemental oxygen.      ASSESSMENT:    1. Chronic systolic heart failure (HCC)   2. Typical atrial flutter (HCC)    PLAN:    In order of problems listed above:  #Typical atrial flutter Symptomatic and associated with reduced ejection fraction. Continue Eliquis for stroke prophylaxis His management is made significantly more complicated given his morbid obesity, COPD requiring oxygen, ongoing tobacco abuse.  Rhythm control is indicated. For now, continue Eliquis for stroke prophylaxis and continue Toprol-XL.  If he were to have recurrence, favor initiation of antiarrhythmic drug.  He is a poor candidate for catheter ablation.  #Chronic systolic heart failure NYHA class II-III.  Warm and relatively dry on exam today.  Continue Lasix, Imdur, metoprolol.   Follow-up with EP APP in 6 months.      Signed, Steffanie Dunn, MD, [facility] 04/15/2023 9:59 AM    Electrophysiology [facility]

## 2023-04-14 NOTE — Telephone Encounter (Signed)
Letter has been printed and placed up front for pick up. Pt is aware.

## 2023-04-14 NOTE — Telephone Encounter (Signed)
Again, this is the FMLA. I believe last we were waiting for him to bring in his forms?

## 2023-04-14 NOTE — Telephone Encounter (Addendum)
Spoke with pt. Advised him that we are still needing FMLA forms from his employer. States that he will contact them and have them faxed to [place]. In the meantime, he needs a work note keeping him out of work until 04/28/2023, pt will need this to cover him from his last OV until this date.  Tabitha - please advise if we can do this. Thank you.

## 2023-04-14 NOTE — Patient Instructions (Signed)
Medication Instructions:   START Imdur - take half tablet (15mg ) by mouth daily.   *If you need a refill on your cardiac medications before your next appointment, please call your pharmacy*   Lab Work:  None Ordered  If you have labs (blood work) drawn today and your tests are completely normal, you will receive your results only by: MyChart Message (if you have MyChart) OR A paper copy in the mail If you have any lab test that is abnormal or we need to change your treatment, we will call you to review the results.   Testing/Procedures:  Your physician has requested that you have an Limited echocardiogram. Echocardiography is a painless test that uses sound waves to create images of your heart. It provides your doctor with information about the size and shape of your heart and how well your heart's chambers and valves are working. This procedure takes approximately one hour. There are no restrictions for this procedure. Please do NOT wear cologne, perfume, aftershave, or lotions (deodorant is allowed). Please arrive 15 minutes prior to your appointment time.    Follow-Up: At [facility], you and your health needs are our priority.  As part of our continuing mission to provide you with exceptional heart care, we have created designated Provider Care Teams.  These Care Teams include your primary Cardiologist (physician) and Advanced Practice Providers (APPs -  Physician Assistants and Nurse Practitioners) who all work together to provide you with the care you need, when you need it.  We recommend signing up for the patient portal called "MyChart".  Sign up information is provided on this After Visit Summary.  MyChart is used to connect with patients for Virtual Visits (Telemedicine).  Patients are able to view lab/test results, encounter notes, upcoming appointments, etc.  Non-urgent messages can be sent to your provider as well.   To learn more about what you can do with  MyChart, go to [email or web address].    Your next appointment:   6 week(s)  Provider:   You may see Debbe Odea, MD or one of the following Advanced Practice Providers on your designated Care Team:   Nicolasa Ducking, NP Eula Listen, PA-C Cadence Fransico Michael, PA-C Charlsie Quest, NP

## 2023-04-15 ENCOUNTER — Ambulatory Visit: Payer: No Typology Code available for payment source | Attending: Cardiology | Admitting: Cardiology

## 2023-04-15 ENCOUNTER — Encounter: Payer: Self-pay | Admitting: *Deleted

## 2023-04-15 ENCOUNTER — Encounter: Payer: Self-pay | Admitting: Cardiology

## 2023-04-15 VITALS — BP 112/60 | HR 78 | Ht 69.0 in | Wt 279.2 lb

## 2023-04-15 DIAGNOSIS — I483 Typical atrial flutter: Secondary | ICD-10-CM

## 2023-04-15 DIAGNOSIS — I5022 Chronic systolic (congestive) heart failure: Secondary | ICD-10-CM | POA: Diagnosis not present

## 2023-04-15 NOTE — Patient Instructions (Addendum)
Medication Instructions:  Your physician recommends that you continue on your current medications as directed. Please refer to the Current Medication list given to you today.  *If you need a refill on your cardiac medications before your next appointment, please call your pharmacy*   Follow-Up: At [facility], you and your health needs are our priority.  As part of our continuing mission to provide you with exceptional heart care, we have created designated Provider Care Teams.  These Care Teams include your primary Cardiologist (physician) and Advanced Practice Providers (APPs -  Physician Assistants and Nurse Practitioners) who all work together to provide you with the care you need, when you need it.   Your next appointment:   6 months with Levy Sjogren, NP

## 2023-04-20 ENCOUNTER — Ambulatory Visit (INDEPENDENT_AMBULATORY_CARE_PROVIDER_SITE_OTHER): Payer: No Typology Code available for payment source

## 2023-04-20 DIAGNOSIS — R0609 Other forms of dyspnea: Secondary | ICD-10-CM

## 2023-04-20 NOTE — Progress Notes (Signed)
Patient in office for SMW.

## 2023-05-05 ENCOUNTER — Ambulatory Visit: Payer: No Typology Code available for payment source | Attending: Cardiology

## 2023-05-05 DIAGNOSIS — Z8679 Personal history of other diseases of the circulatory system: Secondary | ICD-10-CM | POA: Diagnosis not present

## 2023-05-05 DIAGNOSIS — I5032 Chronic diastolic (congestive) heart failure: Secondary | ICD-10-CM

## 2023-05-05 LAB — ECHOCARDIOGRAM LIMITED: Area-P 1/2: 3.77 cm2

## 2023-05-07 NOTE — Progress Notes (Signed)
Heart squeeze tenderness 60 to 65%, no regional wall motion abnormalities, muscle stiffness noted likely from age and hypertension, mild leakage noted in the mitral valve.  Concern for possible abnormal finding on the subaortic membrane on a different heart valve.  Recommend cardiac MRI for evaluation

## 2023-05-11 ENCOUNTER — Encounter: Payer: Self-pay | Admitting: Family

## 2023-05-11 ENCOUNTER — Encounter: Payer: Self-pay | Admitting: Cardiology

## 2023-05-14 ENCOUNTER — Ambulatory Visit: Payer: No Typology Code available for payment source | Attending: Pulmonary Disease

## 2023-05-21 ENCOUNTER — Encounter: Payer: Self-pay | Admitting: Emergency Medicine

## 2023-05-27 ENCOUNTER — Ambulatory Visit: Payer: No Typology Code available for payment source | Admitting: Pulmonary Disease

## 2023-05-27 ENCOUNTER — Telehealth: Payer: Self-pay

## 2023-05-27 VITALS — BP 126/78 | HR 85 | Temp 97.1°F | Ht 69.0 in | Wt 287.0 lb

## 2023-05-27 DIAGNOSIS — I5032 Chronic diastolic (congestive) heart failure: Secondary | ICD-10-CM | POA: Diagnosis not present

## 2023-05-27 DIAGNOSIS — J441 Chronic obstructive pulmonary disease with (acute) exacerbation: Secondary | ICD-10-CM

## 2023-05-27 DIAGNOSIS — R0600 Dyspnea, unspecified: Secondary | ICD-10-CM

## 2023-05-27 DIAGNOSIS — G4733 Obstructive sleep apnea (adult) (pediatric): Secondary | ICD-10-CM

## 2023-05-27 DIAGNOSIS — I480 Paroxysmal atrial fibrillation: Secondary | ICD-10-CM | POA: Diagnosis not present

## 2023-05-27 MED ORDER — TRELEGY ELLIPTA 100-62.5-25 MCG/ACT IN AEPB: 1.0000 | INHALATION_SPRAY | Freq: Every day | RESPIRATORY_TRACT | 0 refills | Status: DC

## 2023-05-27 MED ORDER — TRELEGY ELLIPTA 100-62.5-25 MCG/ACT IN AEPB: 1.0000 | INHALATION_SPRAY | Freq: Once | RESPIRATORY_TRACT | 6 refills | Status: AC

## 2023-05-27 NOTE — Addendum Note (Signed)
Addended by: Janann Colonel on: 05/27/2023 01:50 PM   Modules accepted: Orders

## 2023-05-27 NOTE — Progress Notes (Addendum)
Synopsis: Referred in by Mort Sawyers, FNP   Subjective:   PATIENT ID: Ryan Hatfield GENDER: male DOB: 12-May-1966, MRN: [id number]  No chief complaint on file.   HPI Ryan Hatfield is a 57 year old male patient with a past medical history of paroxysmal A-fib on Eliquis, hypothyroidism on Synthroid tobacco use disorder, presumed COPD presenting to the pulmonary clinic today for ongoing shortness of breath.  He was recently admitted to [place] [facility] from 07/25-08/01 with shortness of breath lower extremity edema fatigue and lethargy.  He was found to be in CHF exacerbation with BNP 123 chest x-ray with pulmonary vascular congestion and right pleural effusion. Echocardiogram with EF 40 to 45% Diastology could not be assessed. CTA chest with right pleural effusion, increased contrast in hepatic veins.  He was diuresed aggressively and lost 30 pounds of fluids.  He felt well and was discharged on 2 L nasal cannula.  Still has significant shortness of breath on exertion with significant wheezing.  Family history -he denies any family history of pulmonary diseases  Social history -smokes 5 cigarettes a day started at 57 years old.  Denies any alcohol or illicit drug use.  He lives with his brother and works in [employer].  ROS All systems were reviewed and are negative except for the above.  Objective:   There were no vitals filed for this visit.    on RA BMI Readings from Last 3 Encounters:  04/15/23 41.24 kg/m  04/14/23 41.53 kg/m  04/02/23 40.46 kg/m   Wt Readings from Last 3 Encounters:  04/15/23 279 lb 4 oz (126.7 kg)  04/14/23 281 lb 3.2 oz (127.6 kg)  04/02/23 274 lb (124.3 kg)    Physical Exam GEN: NAD, Healthy Appearing HEENT: Supple Neck, Reactive Pupils, EOMI  CVS: Normal S1, Normal S2, RRR, No murmurs or ES appreciated  Lungs: Bibasilar crackles noted Abdomen: Soft, non tender, non distended, + BS  Extremities: Warm and well perfused, No edema  Skin: No  suspicious lesions appreciated  Psych: Normal Affect  Ancillary Information   CBC    Component Value Date/Time   WBC 10.1 03/02/2023 1515   WBC 9.9 02/05/2023 0055   RBC 5.74 03/02/2023 1515   RBC 5.30 02/05/2023 0055   HGB 16.2 03/02/2023 1515   HCT 51.6 (H) 03/02/2023 1515   PLT 199 03/02/2023 1515   MCV 90 03/02/2023 1515   MCH 28.2 03/02/2023 1515   MCH 28.7 02/05/2023 0055   MCHC 31.4 (L) 03/02/2023 1515   MCHC 31.1 02/05/2023 0055   RDW 14.5 03/02/2023 1515   LYMPHSABS 2.7 09/05/2021 0820   MONOABS 0.8 09/05/2021 0820   EOSABS 0.3 09/05/2021 0820   BASOSABS 0.1 09/05/2021 0820    Imaging  CXR 07/25: *Mild pulmonary vascular congestion. Probable small right pleural effusion with associated compressive atelectasis.   CTA Chest 01/29/2023:  No pneumothorax is noted. Small right pleural effusion is noted with adjacent atelectasis of the right lower lobe. Left lung is unremarkable.  TEE 02/04/2023:   1. Limited study, transgastric views not obtained as patient developed  hypoxia and procedure was aborted   2. Left ventricular ejection fraction, by estimation, is 40 to 45%. The  left ventricle has mildly decreased function.   3. Right ventricular systolic function is mildly reduced. The right  ventricular size is normal.   4. Left atrial size was mildly dilated. No left atrial/left atrial  appendage thrombus was detected.   5. Right atrial size was mildly dilated.   6. The  mitral valve is normal in structure. Moderate mitral valve  regurgitation.   7. The aortic valve is tricuspid. Aortic valve regurgitation is trivial.        No data to display           Assessment & Plan:  Ryan Hatfield is a 57 year old male patient with a past medical history of paroxysmal A-fib on Eliquis, hypothyroidism on Synthroid tobacco use disorder, presumed COPD presenting to the pulmonary clinic today for ongoing shortness of breath.  #Shortness of breath  #Presumed COPD pending  PFTs   #HFpEF dry weigh 260lbs  #Pleural Effusion - Appears to be in the setting of Chf. In office [place] did not demonstrate any effusions  #Paroxysmal A.fib on eliquis  #Severe OSA AHI 42.5 with CSA and severe desaturation.  #Tobacco use disorder   6 min walk test 185 meters significantly reduced.   []  PFTs  []  d/cTiotropium Bromide [Spiriva CAP] 1 Cap daily []  Start Fluticasone-Umeclidinium-Vilanterol [Trelegy Ellipta] 100 1 puff once a day. []  Albuterol Inh 2 puffs Q6H as needed  []  Will refer to pulmonary rehab on next visit.  []  Enroll in LDCT scan program  []  Currently on furosemide 40mg  PO daily. Following up with cardiology on 11/21 []  Will refer for titration study  No follow-ups on file.  I spent 60 minutes caring for this patient today, including preparing to see the patient, obtaining a medical history , reviewing a separately obtained history, performing a medically appropriate examination and/or evaluation, ordering medications, tests, or procedures, documenting clinical information in the electronic health record, and independently interpreting results (not separately reported/billed) and communicating results to the patient/family/caregiver  Janann Colonel, MD [facility] 05/27/2023 10:38 AM

## 2023-05-27 NOTE — Telephone Encounter (Signed)
HST located in epic and given to Dr. Larinda Buttery for review.

## 2023-05-28 ENCOUNTER — Encounter: Payer: Self-pay | Admitting: Cardiology

## 2023-05-28 ENCOUNTER — Ambulatory Visit: Payer: No Typology Code available for payment source | Attending: Cardiology | Admitting: Cardiology

## 2023-05-28 VITALS — BP 150/62 | HR 82 | Ht 69.0 in | Wt 285.0 lb

## 2023-05-28 DIAGNOSIS — I4892 Unspecified atrial flutter: Secondary | ICD-10-CM

## 2023-05-28 DIAGNOSIS — E7800 Pure hypercholesterolemia, unspecified: Secondary | ICD-10-CM | POA: Diagnosis not present

## 2023-05-28 DIAGNOSIS — I2510 Atherosclerotic heart disease of native coronary artery without angina pectoris: Secondary | ICD-10-CM | POA: Diagnosis not present

## 2023-05-28 DIAGNOSIS — I10 Essential (primary) hypertension: Secondary | ICD-10-CM | POA: Diagnosis not present

## 2023-05-28 DIAGNOSIS — F17200 Nicotine dependence, unspecified, uncomplicated: Secondary | ICD-10-CM

## 2023-05-28 DIAGNOSIS — R0602 Shortness of breath: Secondary | ICD-10-CM

## 2023-05-28 NOTE — Progress Notes (Signed)
Cardiology Office Note:    Date:  05/28/2023   ID:  CADE SWAYZER, DOB 10-23-1965, MRN [id number]  PCP:  Mort Sawyers, FNP   [facility] Providers Cardiologist:  Debbe Odea, MD Electrophysiologist:  Lanier Prude, MD     Referring MD: Mort Sawyers, FNP   Chief Complaint  Patient presents with   Follow-up    6 week follow up post echo. Patient c/o SOB all the time. Meds reviewed verbally with patient.      History of Present Illness:    Ryan Hatfield is a 57 y.o. male with a hx of atrial flutter s/p TEE guided DCCV 7/24, nonobstructive CAD (40% RCA, 20% mid LAD [facility] 7/24), hypertension, hyperlipidemia, current smoker x40+ years, COPD on oxygen, OSA who presents for follow-up.  Endorses shortness of breath with exertion, was told by his pulmonary physician he will need oxygen for the rest of his life.  Recently diagnosed with severe OSA, planning on getting CPAP machine soon.  He still smokes.  Working on quitting.  Takes all meds as prescribed.  No significant edema.   Past Medical History:  Diagnosis Date   Acute appendicitis    Alcohol abuse, in remission 05/29/2015   Anxiety and depression 05/29/2015   Arthritis, degenerative 02/03/2013   Overview:  Of right middle finger MCP joint    Cannot sleep 02/03/2013   Chest pain 01/15/2013   Hypertension    Plantar fasciitis of left foot 01/17/2016    Past Surgical History:  Procedure Laterality Date   CARDIOVERSION N/A 02/04/2023   Procedure: CARDIOVERSION;  Surgeon: Little Ishikawa, MD;  Location: [facility] INVASIVE CV LAB;  Service: Cardiovascular;  Laterality: N/A;   LAPAROSCOPIC APPENDECTOMY N/A 11/25/2017   Procedure: APPENDECTOMY LAPAROSCOPIC;  Surgeon: Lattie Haw, MD;  Location: [facility] ORS;  Service: General;  Laterality: N/A;   RIGHT/LEFT HEART CATH AND CORONARY ANGIOGRAPHY N/A 02/02/2023   Procedure: RIGHT/LEFT HEART CATH AND CORONARY ANGIOGRAPHY;  Surgeon: Lennette Bihari, MD;   Location: [facility] INVASIVE CV LAB;  Service: Cardiovascular;  Laterality: N/A;   SEPTOPLASTY     TEE WITHOUT CARDIOVERSION N/A 02/04/2023   Procedure: TRANSESOPHAGEAL ECHOCARDIOGRAM;  Surgeon: Little Ishikawa, MD;  Location: [facility] INVASIVE CV LAB;  Service: Cardiovascular;  Laterality: N/A;   TONSILLECTOMY      Current Medications: Current Meds  Medication Sig   albuterol (VENTOLIN HFA) 108 (90 Base) MCG/ACT inhaler TAKE 2 PUFFS BY MOUTH EVERY 6 HOURS AS NEEDED FOR WHEEZE OR SHORTNESS OF BREATH (Patient taking differently: Inhale 2 puffs into the lungs See admin instructions. TAKE 2 PUFFS BY MOUTH EVERY 6 HOURS AS NEEDED FOR WHEEZE OR SHORTNESS OF BREATH)   apixaban (ELIQUIS) 5 MG TABS tablet Take 1 tablet (5 mg total) by mouth 2 (two) times daily.   atorvastatin (LIPITOR) 40 MG tablet Take 1 tablet (40 mg total) by mouth daily.   Fluticasone-Umeclidin-Vilant (TRELEGY ELLIPTA) 100-62.5-25 MCG/ACT AEPB Inhale 1 puff into the lungs daily.   folic acid (FOLVITE) 1 MG tablet Take 1 tablet (1 mg total) by mouth daily.   furosemide (LASIX) 40 MG tablet Take 1 tablet (40 mg total) by mouth daily.   gabapentin (NEURONTIN) 100 MG capsule Take 1 capsule (100 mg total) by mouth 3 (three) times daily.   isosorbide mononitrate (IMDUR) 30 MG 24 hr tablet Take 0.5 tablets (15 mg total) by mouth daily.   levothyroxine (SYNTHROID) 50 MCG tablet Take 1 tablet (50 mcg total) by mouth daily.  metoprolol succinate (TOPROL XL) 50 MG 24 hr tablet Take 3 tablets (150 mg total) by mouth daily. Take with or immediately following a meal.   nicotine (NICODERM CQ - DOSED IN MG/24 HOURS) 14 mg/24hr patch Place 1 patch (14 mg total) onto the skin daily.     Allergies:   Lisinopril, Paroxetine, and Sertraline hcl   Social History   Socioeconomic History   Marital status: Divorced    Spouse name: Not on file   Number of children: Not on file   Years of education: Not on file   Highest education level: Not on file   Occupational History   Occupation: supervisor    Comment: [employer]  Tobacco Use   Smoking status: Every Day    Current packs/day: 1.00    Average packs/day: 1 pack/day for 40.0 years (40.0 ttl pk-yrs)    Types: Cigarettes   Smokeless tobacco: Never   Tobacco comments:    5-6 cigs daily- 02/24/23  Vaping Use   Vaping status: Former  Substance and Sexual Activity   Alcohol use: Yes    Comment: occassional   Drug use: No   Sexual activity: Yes    Partners: Female  Other Topics Concern   Not on file  Social History Narrative   Not on file   Social Determinants of Health   Financial Resource Strain: Not on file  Food Insecurity: No Food Insecurity (01/30/2023)   Hunger Vital Sign    Worried About Running Out of Food in the Last Year: Never true    Ran Out of Food in the Last Year: Never true  Transportation Needs: No Transportation Needs (01/30/2023)   PRAPARE - Administrator, Civil Service (Medical): No    Lack of Transportation (Non-Medical): No  Physical Activity: Not on file  Stress: Not on file  Social Connections: Not on file     Family History: The patient's family history includes Arthritis in his mother; Cancer in his maternal grandmother; Diabetes in his brother; Heart disease in his father and mother; Kidney cancer in his paternal uncle; Prostate cancer in his paternal grandfather; Stroke in his brother.  ROS:   Please see the history of present illness.     All other systems reviewed and are negative.  EKGs/Labs/Other Studies Reviewed:    The following studies were reviewed today:        Recent Labs: 01/29/2023: B Natriuretic Peptide 123.1; TSH 4.222 03/02/2023: BUN 15; Creatinine, Ser 1.03; Hemoglobin 16.2; Magnesium 1.7; Platelets 199; Potassium 3.9; Sodium 142  Recent Lipid Panel    Component Value Date/Time   CHOL 126 01/30/2023 0048   CHOL 168 09/06/2015 0831   TRIG 96 01/30/2023 0048   HDL 28 (L) 01/30/2023 0048   HDL 27  (L) 09/06/2015 0831   CHOLHDL 4.5 01/30/2023 0048   VLDL 19 01/30/2023 0048   LDLCALC 79 01/30/2023 0048   LDLCALC 102 (H) 09/06/2015 0831   LDLDIRECT 123.0 06/11/2015 0846     Risk Assessment/Calculations:          Physical Exam:    VS:  BP (!) 150/62 (BP Location: Left Arm, Patient Position: Sitting, Cuff Size: Large)   Pulse 82   Ht 5\' 9"  (1.753 m)   Wt 285 lb (129.3 kg)   SpO2 90%   BMI 42.09 kg/m     Wt Readings from Last 3 Encounters:  05/28/23 285 lb (129.3 kg)  05/27/23 287 lb (130.2 kg)  04/15/23 279 lb 4  oz (126.7 kg)     GEN:  Well nourished, well developed in no acute distress HEENT: Normal NECK: No JVD; No carotid bruits CARDIAC: RRR, no murmurs, rubs, gallops RESPIRATORY: Diminished breath sounds, mild expiratory wheezing on left ABDOMEN: Soft, non-tender, non-distended MUSCULOSKELETAL:  No edema; No deformity  SKIN: Warm and dry NEUROLOGIC:  Alert and oriented x 3 PSYCHIATRIC:  Normal affect   ASSESSMENT:    1. Atrial flutter, unspecified type (HCC)   2. Coronary artery disease involving native coronary artery of native heart, unspecified whether angina present   3. Primary hypertension   4. Pure hypercholesterolemia   5. Smoking   6. Shortness of breath     PLAN:    In order of problems listed above:  Atrial flutter, regular rhythm on exam.  Heart rate 82.  Continue Toprol-XL 150 mg daily, Eliquis 5 mg twice daily.  Echo 10/24 EF 60 to 65% Nonobstructive CAD, 40% proximal RCA, 20% mid LAD.  Continue aspirin 81 mg daily, Lipitor 40 mg daily. Hypertension, BP elevated, usually controlled.  Continue Toprol-XL 150 mg daily, Imdur 50 mg daily. Hyperlipidemia, continue Lipitor 40 mg daily. Current smoker, cessation advised. Shortness of breath, resulting from combination of obesity, COPD, untreated sleep apnea.  Plans to get CPAP mask soon.  Smoking cessation advised.  COPD management as per pulmonary medicine.  Follow-up 6 months       Medication Adjustments/Labs and Tests Ordered: Current medicines are reviewed at length with the patient today.  Concerns regarding medicines are outlined above.  No orders of the defined types were placed in this encounter.  No orders of the defined types were placed in this encounter.   Patient Instructions  Medication Instructions:   Your physician recommends that you continue on your current medications as directed. Please refer to the Current Medication list given to you today.  *If you need a refill on your cardiac medications before your next appointment, please call your pharmacy*   Lab Work:  None Ordered  If you have labs (blood work) drawn today and your tests are completely normal, you will receive your results only by: MyChart Message (if you have MyChart) OR A paper copy in the mail If you have any lab test that is abnormal or we need to change your treatment, we will call you to review the results.   Testing/Procedures:  None Ordered   Follow-Up: At [facility], you and your health needs are our priority.  As part of our continuing mission to provide you with exceptional heart care, we have created designated Provider Care Teams.  These Care Teams include your primary Cardiologist (physician) and Advanced Practice Providers (APPs -  Physician Assistants and Nurse Practitioners) who all work together to provide you with the care you need, when you need it.  We recommend signing up for the patient portal called "MyChart".  Sign up information is provided on this After Visit Summary.  MyChart is used to connect with patients for Virtual Visits (Telemedicine).  Patients are able to view lab/test results, encounter notes, upcoming appointments, etc.  Non-urgent messages can be sent to your provider as well.   To learn more about what you can do with MyChart, go to [email or web address].    Your next appointment:   6 month(s)  Provider:   You may see  Debbe Odea, MD or one of the following Advanced Practice Providers on your designated Care Team:   Nicolasa Ducking, NP Eula Listen, PA-C Cadence Fransico Ryan, [place]  Charlsie Quest, NP Carlos Levering, NP   Signed, Debbe Odea, MD  05/28/2023 11:35 AM    [facility]

## 2023-05-28 NOTE — Patient Instructions (Signed)
 Medication Instructions:   Your physician recommends that you continue on your current medications as directed. Please refer to the Current Medication list given to you today.  *If you need a refill on your cardiac medications before your next appointment, please call your pharmacy*   Lab Work:  None Ordered  If you have labs (blood work) drawn today and your tests are completely normal, you will receive your results only by: MyChart Message (if you have MyChart) OR A paper copy in the mail If you have any lab test that is abnormal or we need to change your treatment, we will call you to review the results.   Testing/Procedures:  None Ordered   Follow-Up: At [facility], you and your health needs are our priority.  As part of our continuing mission to provide you with exceptional heart care, we have created designated Provider Care Teams.  These Care Teams include your primary Cardiologist (physician) and Advanced Practice Providers (APPs -  Physician Assistants and Nurse Practitioners) who all work together to provide you with the care you need, when you need it.  We recommend signing up for the patient portal called "MyChart".  Sign up information is provided on this After Visit Summary.  MyChart is used to connect with patients for Virtual Visits (Telemedicine).  Patients are able to view lab/test results, encounter notes, upcoming appointments, etc.  Non-urgent messages can be sent to your provider as well.   To learn more about what you can do with MyChart, go to [email or web address].    Your next appointment:   6 month(s)  Provider:   You may see Debbe Odea, MD or one of the following Advanced Practice Providers on your designated Care Team:   Nicolasa Ducking, NP Eula Listen, PA-C Cadence Fransico Michael, PA-C Charlsie Quest, NP Carlos Levering, NP

## 2023-07-21 ENCOUNTER — Ambulatory Visit: Payer: Medicaid Other | Attending: Pulmonary Disease

## 2023-07-21 DIAGNOSIS — R0602 Shortness of breath: Secondary | ICD-10-CM | POA: Insufficient documentation

## 2023-07-21 DIAGNOSIS — F17210 Nicotine dependence, cigarettes, uncomplicated: Secondary | ICD-10-CM | POA: Diagnosis not present

## 2023-07-21 DIAGNOSIS — R0609 Other forms of dyspnea: Secondary | ICD-10-CM | POA: Insufficient documentation

## 2023-07-21 LAB — PULMONARY FUNCTION TEST ARMC ONLY
DL/VA % pred: 88 %
DL/VA: 3.8 ml/min/mmHg/L
DLCO unc % pred: 74 %
DLCO unc: 19.98 ml/min/mmHg
FEF 25-75 Post: 1.54 L/s
FEF 25-75 Pre: 1.27 L/s
FEF2575-%Change-Post: 21 %
FEF2575-%Pred-Post: 52 %
FEF2575-%Pred-Pre: 43 %
FEV1-%Change-Post: 5 %
FEV1-%Pred-Post: 49 %
FEV1-%Pred-Pre: 47 %
FEV1-Post: 1.74 L
FEV1-Pre: 1.65 L
FEV1FVC-%Change-Post: -5 %
FEV1FVC-%Pred-Pre: 99 %
FEV6-%Change-Post: 11 %
FEV6-%Pred-Post: 55 %
FEV6-%Pred-Pre: 49 %
FEV6-Post: 2.43 L
FEV6-Pre: 2.19 L
FEV6FVC-%Change-Post: 0 %
FEV6FVC-%Pred-Post: 104 %
FEV6FVC-%Pred-Pre: 104 %
FVC-%Change-Post: 11 %
FVC-%Pred-Post: 52 %
FVC-%Pred-Pre: 47 %
FVC-Post: 2.43 L
FVC-Pre: 2.19 L
Post FEV1/FVC ratio: 72 %
Post FEV6/FVC ratio: 100 %
Pre FEV1/FVC ratio: 76 %
Pre FEV6/FVC Ratio: 100 %
RV % pred: 148 %
RV: 3.23 L
TLC % pred: 86 %
TLC: 5.9 L

## 2023-07-21 MED ORDER — ALBUTEROL SULFATE (2.5 MG/3ML) 0.083% IN NEBU
2.5000 mg | INHALATION_SOLUTION | Freq: Once | RESPIRATORY_TRACT | Status: AC
  Administered 2023-07-21: 2.5 mg via RESPIRATORY_TRACT
  Filled 2023-07-21: qty 3

## 2023-07-29 ENCOUNTER — Ambulatory Visit (INDEPENDENT_AMBULATORY_CARE_PROVIDER_SITE_OTHER): Payer: Medicaid Other | Admitting: Pulmonary Disease

## 2023-07-29 ENCOUNTER — Encounter: Payer: Self-pay | Admitting: Pulmonary Disease

## 2023-07-29 VITALS — BP 134/78 | HR 90 | Temp 97.5°F | Ht 69.0 in | Wt 295.4 lb

## 2023-07-29 DIAGNOSIS — R0602 Shortness of breath: Secondary | ICD-10-CM | POA: Diagnosis not present

## 2023-07-29 DIAGNOSIS — R0609 Other forms of dyspnea: Secondary | ICD-10-CM | POA: Diagnosis not present

## 2023-07-29 MED ORDER — TRELEGY ELLIPTA 100-62.5-25 MCG/ACT IN AEPB: 1.0000 | INHALATION_SPRAY | Freq: Every day | RESPIRATORY_TRACT | Status: DC

## 2023-07-29 MED ORDER — TRELEGY ELLIPTA 200-62.5-25 MCG/ACT IN AEPB: 1.0000 | INHALATION_SPRAY | Freq: Every day | RESPIRATORY_TRACT | Status: DC

## 2023-07-29 NOTE — Progress Notes (Signed)
Synopsis: Referred in by Mort Sawyers, FNP   Subjective:   PATIENT ID: Ryan Hatfield GENDER: male DOB: 1965/08/10, MRN: [id number]  Chief Complaint  Patient presents with   Follow-up    Cough with yellow sputum. Some wheezing. DOE.    HPI Ryan Hatfield is a 58 year old male patient with a past medical history of paroxysmal A-fib on Eliquis, hypothyroidism on Synthroid tobacco use disorder, presumed COPD presenting to the pulmonary clinic today for ongoing shortness of breath.  He was recently admitted to [place] [facility] from 07/25-08/01 with shortness of breath lower extremity edema fatigue and lethargy.  He was found to be in CHF exacerbation with BNP 123 chest x-ray with pulmonary vascular congestion and right pleural effusion. Echocardiogram with EF 40 to 45% Diastology could not be assessed. CTA chest with right pleural effusion, increased contrast in hepatic veins.  He was diuresed aggressively and lost 30 pounds of fluids.  He felt well and was discharged on 2 L nasal cannula.  Improved significant on Trelegy ellipta but remains with significant dizziness lightheadedness on exertion.   PFTs 07/2023 - Sprio with reduced FEV-1 47% of predicted, reduced FVC at 47%of predicted normal ratio and significant response to bronchodilators. Reduced PFR Lung volumes suggestive of air trapping. Mildly reduced DLCO.   Unable to start CPAP due to insurance issues. Currently submitting for disability.   Family history -he denies any family history of pulmonary diseases  Social history -smokes 5 cigarettes a day started at 58 years old.  Denies any alcohol or illicit drug use.  He lives with his brother and works in [employer].  ROS All systems were reviewed and are negative except for the above.  Objective:   Vitals:   07/29/23 1403  BP: 134/78  Pulse: 90  Temp: (!) 97.5 F (36.4 C)  SpO2: 94%  Weight: 295 lb 6.4 oz (134 kg)  Height: 5\' 9"  (1.753 m)    94% on RA BMI Readings  from Last 3 Encounters:  07/29/23 43.62 kg/m  05/28/23 42.09 kg/m  05/27/23 42.38 kg/m   Wt Readings from Last 3 Encounters:  07/29/23 295 lb 6.4 oz (134 kg)  05/28/23 285 lb (129.3 kg)  05/27/23 287 lb (130.2 kg)    Physical Exam GEN: NAD, Healthy Appearing HEENT: Supple Neck, Reactive Pupils, EOMI  CVS: Normal S1, Normal S2, RRR, No murmurs or ES appreciated  Lungs: Bibasilar crackles noted Abdomen: Soft, non tender, non distended, + BS  Extremities: Warm and well perfused, No edema  Skin: No suspicious lesions appreciated  Psych: Normal Affect  Ancillary Information   CBC    Component Value Date/Time   WBC 10.1 03/02/2023 1515   WBC 9.9 02/05/2023 0055   RBC 5.74 03/02/2023 1515   RBC 5.30 02/05/2023 0055   HGB 16.2 03/02/2023 1515   HCT 51.6 (H) 03/02/2023 1515   PLT 199 03/02/2023 1515   MCV 90 03/02/2023 1515   MCH 28.2 03/02/2023 1515   MCH 28.7 02/05/2023 0055   MCHC 31.4 (L) 03/02/2023 1515   MCHC 31.1 02/05/2023 0055   RDW 14.5 03/02/2023 1515   LYMPHSABS 2.7 09/05/2021 0820   MONOABS 0.8 09/05/2021 0820   EOSABS 0.3 09/05/2021 0820   BASOSABS 0.1 09/05/2021 0820    Imaging  CXR 07/25: *Mild pulmonary vascular congestion. Probable small right pleural effusion with associated compressive atelectasis.   CTA Chest 01/29/2023:  No pneumothorax is noted. Small right pleural effusion is noted with adjacent atelectasis of the right lower lobe. Left  lung is unremarkable.  TEE 02/04/2023:   1. Limited study, transgastric views not obtained as patient developed  hypoxia and procedure was aborted   2. Left ventricular ejection fraction, by estimation, is 40 to 45%. The  left ventricle has mildly decreased function.   3. Right ventricular systolic function is mildly reduced. The right  ventricular size is normal.   4. Left atrial size was mildly dilated. No left atrial/left atrial  appendage thrombus was detected.   5. Right atrial size was mildly  dilated.   6. The mitral valve is normal in structure. Moderate mitral valve  regurgitation.   7. The aortic valve is tricuspid. Aortic valve regurgitation is trivial.       Latest Ref Rng & Units 07/21/2023    9:48 AM  PFT Results  FVC-Predicted Pre % 47  P  FVC-Post L 2.43  P  FVC-Predicted Post % 52  P  Pre FEV1/FVC % % 76  P  Post FEV1/FCV % % 72  P  FEV1-Pre L 1.65  P  FEV1-Predicted Pre % 47  P  FEV1-Post L 1.74  P  DLCO uncorrected ml/min/mmHg 19.98  P  DLCO UNC% % 74  P  DLVA Predicted % 88  P  TLC L 5.90  P  TLC % Predicted % 86  P  RV % Predicted % 148  P    P Preliminary result     Assessment & Plan:  Ryan Hatfield is a 58 year old male patient with a past medical history of paroxysmal A-fib on Eliquis, hypothyroidism on Synthroid tobacco use disorder, presumed COPD presenting to the pulmonary clinic today for ongoing shortness of breath.  #Shortness of breath  #COPD/Asthma Overlap   #HFpEF dry weigh 260lbs  #Pleural Effusion - Appears to be in the setting of Chf. In office [place] did not demonstrate any effusions  #Paroxysmal A.fib on eliquis  #Severe OSA AHI 42.5 with CSA and severe desaturation.  #Tobacco use disorder   6 min walk test 185 meters significantly reduced.  []  C/w Fluticasone-Umeclidinium-Vilanterol [Trelegy Ellipta] 100 1 puff once a day. []  Albuterol Inh 2 puffs Q6H as needed  []  Will refer to pulmonary rehab once insurance is settled.  []  Enroll in LDCT scan program once his insurance is settled.  []  Currently on furosemide 40mg  PO daily. Following up with cardiology on 11/21 []  Titration sleep study and referral to sleep medicine once able to.   RTC 3 months.   I spent 30 minutes caring for this patient today, including preparing to see the patient, obtaining a medical history , reviewing a separately obtained history, performing a medically appropriate examination and/or evaluation, ordering medications, tests, or procedures, documenting clinical  information in the electronic health record, and independently interpreting results (not separately reported/billed) and communicating results to the patient/family/caregiver  Janann Colonel, MD [facility] 07/29/2023 2:42 PM

## 2023-08-11 ENCOUNTER — Other Ambulatory Visit: Payer: Self-pay | Admitting: Family

## 2023-08-11 DIAGNOSIS — J4520 Mild intermittent asthma, uncomplicated: Secondary | ICD-10-CM

## 2023-08-12 ENCOUNTER — Encounter: Payer: Self-pay | Admitting: *Deleted

## 2023-08-12 MED ORDER — ALBUTEROL SULFATE HFA 108 (90 BASE) MCG/ACT IN AERS: INHALATION_SPRAY | RESPIRATORY_TRACT | 1 refills | Status: DC

## 2023-09-23 ENCOUNTER — Telehealth: Payer: Self-pay | Admitting: Cardiology

## 2023-09-23 NOTE — Telephone Encounter (Signed)
*  STAT* If patient is at the pharmacy, call can be transferred to refill team.   1. Which medications need to be refilled? (please list name of each medication and dose if known) metoprolol succinate (TOPROL XL) 50 MG 24 hr tablet    2. Would you like to learn more about the convenience, safety, & potential cost savings by using the [facility]?no   3. Are you open to using the [facility] (Type [facility].  ).no   4. Which pharmacy/location (including street and city if local pharmacy) is medication to be sent to?  [id number] - [place], [place] - [address]     5. Do they need a 30 day or 90 day supply? 30 day

## 2023-09-24 MED ORDER — METOPROLOL SUCCINATE ER 50 MG PO TB24: 150.0000 mg | ORAL_TABLET | Freq: Every day | ORAL | 1 refills | Status: DC

## 2023-09-30 ENCOUNTER — Ambulatory Visit: Payer: No Typology Code available for payment source | Admitting: Family

## 2023-10-11 ENCOUNTER — Other Ambulatory Visit: Payer: Self-pay | Admitting: Family

## 2023-10-11 DIAGNOSIS — E039 Hypothyroidism, unspecified: Secondary | ICD-10-CM

## 2023-10-20 ENCOUNTER — Ambulatory Visit: Admitting: Family

## 2023-11-02 ENCOUNTER — Encounter: Payer: Self-pay | Admitting: Pulmonary Disease

## 2023-11-02 ENCOUNTER — Ambulatory Visit: Payer: Medicaid Other | Admitting: Pulmonary Disease

## 2023-11-02 VITALS — BP 128/68 | HR 75 | Temp 97.1°F | Ht 69.0 in | Wt 293.4 lb

## 2023-11-02 DIAGNOSIS — G4733 Obstructive sleep apnea (adult) (pediatric): Secondary | ICD-10-CM | POA: Diagnosis not present

## 2023-11-02 MED ORDER — TRELEGY ELLIPTA 200-62.5-25 MCG/ACT IN AEPB: 1.0000 | INHALATION_SPRAY | Freq: Every day | RESPIRATORY_TRACT | 0 refills | Status: DC

## 2023-11-02 MED ORDER — TRELEGY ELLIPTA 200-62.5-25 MCG/ACT IN AEPB: 1.0000 | INHALATION_SPRAY | Freq: Every day | RESPIRATORY_TRACT | 6 refills | Status: DC

## 2023-11-02 NOTE — Progress Notes (Unsigned)
 Synopsis: Referred in by Felicita Horns, FNP   Subjective:   PATIENT ID: Ryan Hatfield GENDER: male DOB: 1966/01/01, MRN: [id number]  No chief complaint on file.   HPI Barney Boozer is a 58 year old male patient with a past medical history of paroxysmal A-fib on Eliquis , hypothyroidism on Synthroid  tobacco use disorder, presumed COPD presenting to the pulmonary clinic today for ongoing shortness of breath.  He was recently admitted to [facility] from 07/25-08/01 with shortness of breath lower extremity edema fatigue and lethargy.  He was found to be in CHF exacerbation with BNP 123 chest x-ray with pulmonary vascular congestion and right pleural effusion. Echocardiogram with EF 40 to 45% Diastology could not be assessed. CTA chest with right pleural effusion, increased contrast in hepatic veins.  He was diuresed aggressively and lost 30 pounds of fluids.  He felt well and was discharged on 2 L nasal cannula.  Improved significant on Trelegy ellipta  but remains with significant dizziness lightheadedness on exertion.   PFTs 07/2023 - Sprio with reduced FEV-1 47% of predicted, reduced FVC at 47%of predicted normal ratio and significant response to bronchodilators. Reduced PFR Lung volumes suggestive of air trapping. Mildly reduced DLCO.   Unable to start CPAP due to insurance issues. Currently submitting for disability.   Family history -he denies any family history of pulmonary diseases  Social history -smokes 5 cigarettes a day started at 58 years old.  Denies any alcohol or illicit drug use.  He lives with his brother and works in [employer].  ROS All systems were reviewed and are negative except for the above.  Objective:   There were no vitals filed for this visit.     on RA BMI Readings from Last 3 Encounters:  07/29/23 43.62 kg/m  05/28/23 42.09 kg/m  05/27/23 42.38 kg/m   Wt Readings from Last 3 Encounters:  07/29/23 295 lb 6.4 oz (134 kg)  05/28/23 285 lb  (129.3 kg)  05/27/23 287 lb (130.2 kg)    Physical Exam GEN: NAD, Healthy Appearing HEENT: Supple Neck, Reactive Pupils, EOMI  CVS: Normal S1, Normal S2, RRR, No murmurs or ES appreciated  Lungs: Bibasilar crackles noted Abdomen: Soft, non tender, non distended, + BS  Extremities: Warm and well perfused, No edema  Skin: No suspicious lesions appreciated  Psych: Normal Affect  Ancillary Information   CBC    Component Value Date/Time   WBC 10.1 03/02/2023 1515   WBC 9.9 02/05/2023 0055   RBC 5.74 03/02/2023 1515   RBC 5.30 02/05/2023 0055   HGB 16.2 03/02/2023 1515   HCT 51.6 (H) 03/02/2023 1515   PLT 199 03/02/2023 1515   MCV 90 03/02/2023 1515   MCH 28.2 03/02/2023 1515   MCH 28.7 02/05/2023 0055   MCHC 31.4 (L) 03/02/2023 1515   MCHC 31.1 02/05/2023 0055   RDW 14.5 03/02/2023 1515   LYMPHSABS 2.7 09/05/2021 0820   MONOABS 0.8 09/05/2021 0820   EOSABS 0.3 09/05/2021 0820   BASOSABS 0.1 09/05/2021 0820    Imaging  CXR 07/25: *Mild pulmonary vascular congestion. Probable small right pleural effusion with associated compressive atelectasis.   CTA Chest 01/29/2023:  No pneumothorax is noted. Small right pleural effusion is noted with adjacent atelectasis of the right lower lobe. Left lung is unremarkable.  TEE 02/04/2023:   1. Limited study, transgastric views not obtained as patient developed  hypoxia and procedure was aborted   2. Left ventricular ejection fraction, by estimation, is 40 to 45%. The  left ventricle has  mildly decreased function.   3. Right ventricular systolic function is mildly reduced. The right  ventricular size is normal.   4. Left atrial size was mildly dilated. No left atrial/left atrial  appendage thrombus was detected.   5. Right atrial size was mildly dilated.   6. The mitral valve is normal in structure. Moderate mitral valve  regurgitation.   7. The aortic valve is tricuspid. Aortic valve regurgitation is trivial.       Latest  Ref Rng & Units 07/21/2023    9:48 AM  PFT Results  FVC-Pre L 2.19   FVC-Predicted Pre % 47   FVC-Post L 2.43   FVC-Predicted Post % 52   Pre FEV1/FVC % % 76   Post FEV1/FCV % % 72   FEV1-Pre L 1.65   FEV1-Predicted Pre % 47   FEV1-Post L 1.74   DLCO uncorrected ml/min/mmHg 19.98   DLCO UNC% % 74   DLVA Predicted % 88   TLC L 5.90   TLC % Predicted % 86   RV % Predicted % 148      Assessment & Plan:  Barney Boozer is a 58 year old male patient with a past medical history of paroxysmal A-fib on Eliquis , hypothyroidism on Synthroid  tobacco use disorder, presumed COPD presenting to the pulmonary clinic today for ongoing shortness of breath.  #Shortness of breath  #COPD/Asthma Overlap   #HFpEF dry weigh 260lbs  #Pleural Effusion - Appears to be in the setting of Chf. In office US  did not demonstrate any effusions  #Paroxysmal A.fib on eliquis   #Severe OSA AHI 42.5 with CSA and severe desaturation.  #Tobacco use disorder   6 min walk test 185 meters significantly reduced.  []  C/w Fluticasone-Umeclidinium-Vilanterol [Trelegy Ellipta ] 100 1 puff once a day. []  Albuterol  Inh 2 puffs Q6H as needed  []  Will refer to pulmonary rehab once insurance is settled.  []  Enroll in LDCT scan program once his insurance is settled.  []  Currently on furosemide  40mg  PO daily. Following up with cardiology on 11/21 []  Titration sleep study and referral to sleep medicine once able to.   RTC 3 months.   I spent 30 minutes caring for this patient today, including preparing to see the patient, obtaining a medical history , reviewing a separately obtained history, performing a medically appropriate examination and/or evaluation, ordering medications, tests, or procedures, documenting clinical information in the electronic health record, and independently interpreting results (not separately reported/billed) and communicating results to the patient/family/caregiver  Annitta Kindler, MD [facility] 11/02/2023 11:16 AM

## 2023-11-03 ENCOUNTER — Encounter: Payer: Self-pay | Admitting: *Deleted

## 2023-11-09 ENCOUNTER — Telehealth: Payer: Self-pay

## 2023-11-09 DIAGNOSIS — R0602 Shortness of breath: Secondary | ICD-10-CM

## 2023-11-09 DIAGNOSIS — R0609 Other forms of dyspnea: Secondary | ICD-10-CM

## 2023-11-09 DIAGNOSIS — J441 Chronic obstructive pulmonary disease with (acute) exacerbation: Secondary | ICD-10-CM

## 2023-11-09 NOTE — Telephone Encounter (Signed)
Okay to place the order? 

## 2023-11-09 NOTE — Telephone Encounter (Signed)
 Copied from CRM [id number]. Topic: General - Other >> Nov 09, 2023  2:59 PM Ryan Hatfield wrote: Reason for CRM: patient needs a new prescription sent to [employer] for a portable oxygen  machine. Will be for a whole new account including condenser and portable oxygen . [employer] number [id number]

## 2023-11-11 ENCOUNTER — Ambulatory Visit: Admitting: Family

## 2023-11-18 NOTE — Telephone Encounter (Signed)
 Per Dr. Lucina Sabal, ok to order.

## 2023-11-18 NOTE — Telephone Encounter (Signed)
Order has been placed. Nothing further needed. 

## 2023-12-16 ENCOUNTER — Telehealth: Payer: Self-pay | Admitting: Pulmonary Disease

## 2023-12-16 DIAGNOSIS — J441 Chronic obstructive pulmonary disease with (acute) exacerbation: Secondary | ICD-10-CM

## 2023-12-16 DIAGNOSIS — R0609 Other forms of dyspnea: Secondary | ICD-10-CM

## 2023-12-16 DIAGNOSIS — R0602 Shortness of breath: Secondary | ICD-10-CM

## 2023-12-16 NOTE — Telephone Encounter (Signed)
 We sent an order to [facility] for the patient to get a POC Eval done. I spoke with Edwina Gram with [employer] and she stated the patient has an outstanding balance. He got his 02 in 02/2023 and will need to be re certified for his home 02 concentrator. After the new 02 test has been done the patient can get his POC

## 2023-12-22 NOTE — Telephone Encounter (Signed)
 NFN the patient will need to contact [employer]

## 2023-12-31 ENCOUNTER — Encounter: Payer: Self-pay | Admitting: Emergency Medicine

## 2024-01-13 ENCOUNTER — Ambulatory Visit: Attending: Sleep Medicine

## 2024-01-13 DIAGNOSIS — G4733 Obstructive sleep apnea (adult) (pediatric): Secondary | ICD-10-CM | POA: Insufficient documentation

## 2024-01-25 ENCOUNTER — Telehealth: Payer: Self-pay

## 2024-01-25 NOTE — Telephone Encounter (Signed)
 Copied from CRM [id number]. Topic: Clinical - Lab/Test Results >> Jan 25, 2024  2:10 PM Whitney O wrote: Reason for CRM: patient is calling also asking about his sleep study results . Please reach out to patient for more information

## 2024-01-25 NOTE — Telephone Encounter (Signed)
 Patient is calling concerning his O2 portablke oxygen  tank . Patient says he has taken care of outstanding balance . Patient says that [employer] tech says the doctor has to send in a new order  for the portable oxygen  . Please send in new order for patient he say he needs this . He  has taken care of the outstanding balance. Patient says he needs the portable oxygen  bad . He is on homebound with out it . Please reach out to patient with more information [phone]

## 2024-01-30 ENCOUNTER — Telehealth: Payer: Self-pay | Admitting: Pulmonary Disease

## 2024-01-30 DIAGNOSIS — G4733 Obstructive sleep apnea (adult) (pediatric): Secondary | ICD-10-CM

## 2024-01-30 NOTE — Telephone Encounter (Signed)
 Ordering bipap titration.   Darrin Barn, MD [facility] 01/30/2024 2:50 PM

## 2024-02-01 NOTE — Addendum Note (Signed)
 Addended by: VICCI EVALENE DEL on: 02/01/2024 04:32 PM   Modules accepted: Orders

## 2024-02-01 NOTE — Telephone Encounter (Signed)
 New order has been placed for POC.

## 2024-02-01 NOTE — Telephone Encounter (Signed)
Noted, nothing further needed.  

## 2024-02-02 NOTE — Telephone Encounter (Signed)
 New order, note emailed to [employer] today

## 2024-02-03 NOTE — Telephone Encounter (Signed)
 Noted. Nothing further needed.

## 2024-02-11 ENCOUNTER — Other Ambulatory Visit: Payer: Self-pay | Admitting: Cardiology

## 2024-02-15 NOTE — Progress Notes (Signed)
 Cardiology Clinic Note   Date: 02/17/2024 ID: Ryan Hatfield, DOB Apr 27, 1966, MRN [id number]  [facility] Providers Cardiologist:  Redell Cave, MD Electrophysiologist:  OLE ONEIDA HOLTS, MD   Chief Complaint   Ryan Hatfield is a 58 y.o. male who presents to the clinic today for routine follow up.   Patient Profile   Ryan Hatfield is followed by Dr. Cave and Dr. HOLTS for the history outlined below.      Past medical history significant for: Nonobstructive CAD. R/LHC 02/02/2023: Proximal to mid RCA 40%.  Mid LAD 20%. Chronic HFpEF. R/LHC 02/02/2023: Moderate elevation of right heart pressures with moderate pulmonary hypertension and mean PA pressure 39 mmHg. Echo 05/05/2023: EF 60 to 65%.  No RWMA.  Grade I DD.  Normal RV size/function.  Mild MR.  Moderate MAC.  Aortic valve sclerosis/calcification without stenosis. A-flutter. TEE cardioversion 02/04/2023. Hypertension. Hyperlipidemia. Lipid panel 01/30/2023: LDL 79, HDL 28, TG 96, total 126. LPa 02/03/2023: 13.9. OSA. COPD. Gout. Tobacco abuse.  In summary, patient was first evaluated by Dr. Cave on 02/17/2022 for chest pain.  He reported a several month history of chest pain and shortness of breath with exertion.  Echo and CTA were ordered.  CTA was not completed.  Echo demonstrated EF 60 to 65%, no RWMA, mild LVH, LVOT gradient with Valsalva 29 mmHg, Grade I DD, normal RV size/function, mild LAE.  Patient presented 2 months ago to the ED on 01/29/2023 with worsening lower extremity edema bilaterally despite daily Lasix  with extra doses.  He reported increased dyspnea with associated weight gain, PND and orthopnea.  He also reported daily chest pain with or without exertion with associated nausea and diaphoresis.  EKG demonstrated a flutter with RVR of 156 bpm.  He was hypoxic with SpO2 87% on room air.  He was started on oxygen  and Cardizem  drip.  BNP was mildly elevated at 123.  Troponin  20>> 19.  Creatinine 1.13, potassium 4.1, hemoglobin 16.  Chest x-ray revealed mild vascular congestion and small pleural effusion.  Echo demonstrated EF 55 to 60%, indeterminate diastolic parameters, normal RV systolic function, aortic valve sclerosis/calcification without stenosis.  He underwent R/LHC which showed nonobstructive CAD and moderate elevation of right heart pressures with moderate pulmonary hypertension.  He underwent TEE cardioversion.  He was discharged on 02/06/2023.    Upon follow-up in August and October 2024 patient continued to complain of shortness of breath.  He was maintaining sinus rhythm and adherent to Eliquis .  He will follow-up continue supplemental O2.  He was evaluated by Dr. HOLTS on 04/15/2023.  It was felt management of a-flutter was significantly complicated by profound obesity, COPD requiring O2 and ongoing tobacco abuse.  He was felt to be a poor candidate for catheter ablation.  Rhythm control was indicated.  Patient was last seen in the office by Dr. Cave on 05/28/2023.  He continued to report chronic shortness of breath and was told by pulmonologist that he would need supplemental oxygen  for the rest of his life.  He was euvolemic at the time of his visit.     History of Present Illness    Today, patient reports continued complaints of shortness of breath and chest pain. He has chronic shortness of breath unchanged for years requiring supplemental O2. He is supposed to be on continuous O2 but does not have it on today, as he does not have a portable tank. He is pending another sleep study for CPAP titration, as they  could not get the settings properly adjusted at his last sleep study. He also reports a years long history of left sided chest pain occurring with or without exertion lasting seconds to minutes that is unchanged from previous. He has difficulty quantifying the length of time it lasts stating when it lasts more than a second it feels like a really  long time so I don't know how long it lasts. It never lasts hours.  He also describes burning epigastric pain occurring at night and sometimes waking him from sleep. Pain will resolve after taking Tums. He reports lower extremity edema. He states he has lost 15 lb in the last 2 weeks. He notes he stopped taking Eliquis  and Toprol  for about 2 months secondary to issues with his insurance. He continued taking isosorbide  and Lasix . He reports increased lower extremity edema at that time. He restarted Eliquis  and Toprol  2 weeks ago and stopped Lasix . Edema improved. He continues to smoke 5 cigarettes a day.     ROS: All other systems reviewed and are otherwise negative except as noted in History of Present Illness.  EKGs/Labs Reviewed    EKG Interpretation Date/Time:  Wednesday February 17 2024 08:20:37 EDT Ventricular Rate:  70 PR Interval:  168 QRS Duration:  86 QT Interval:  384 QTC Calculation: 414 R Axis:   73  Text Interpretation: Normal sinus rhythm Normal ECG When compared with ECG of 14-Apr-2023 10:38, No significant change was found Confirmed by Loistine Sober [phone]) on 02/17/2024 8:22:16 AM   03/02/2023: BUN 15; Creatinine, Ser 1.03; Potassium 3.9; Sodium 142   03/02/2023: Hemoglobin 16.2; WBC 10.1    Risk Assessment/Calculations     CHA2DS2-VASc Score = 3   This indicates a 3.2% annual risk of stroke. The patient's score is based upon: CHF History: 1 HTN History: 1 Diabetes History: 0 Stroke History: 0 Vascular Disease History: 1 Age Score: 0 Gender Score: 0             Physical Exam    VS:  BP 130/60 (BP Location: Left Arm, Patient Position: Sitting, Cuff Size: Large)   Pulse 70   Ht 5' 9 (1.753 m)   Wt 289 lb 12.8 oz (131.5 kg)   SpO2 99%   BMI 42.80 kg/m  , BMI Body mass index is 42.8 kg/m.  GEN: Well nourished, well developed, in no acute distress. Neck: No JVD or carotid bruits. Cardiac:  RRR.  No murmur. No rubs or gallops.   Respiratory:   Respirations regular and unlabored. Diminished breath sounds without rales, wheezing or rhonchi. GI: Soft, nontender, nondistended. Extremities: Radials/DP/PT 2+ and equal bilaterally. No clubbing or cyanosis. Mild nonpitting edema bilaterally.   Skin: Warm and dry, no rash. Neuro: Strength intact.  Assessment & Plan   Nonobstructive CAD [facility] July 2024 showed proximal to mid RCA 40%, mid LAD 20%.  Patient reports a years long history of left sided chest pain occurring with or without exertion lasting seconds to minutes and resolving on its own unchanged from previous. EKG today without acute changes.  - Increase isosorbide  to 30 mg daily.  - Add prn NTG.  - Continue atorvastatin , Toprol .  Not on aspirin  secondary to Eliquis .  Chronic HFpEF R/LHC: 2024 showed moderate elevation of right heart pressures with moderate pulmonary hypertension mean PA pressure 39 mmHg.  Echo October 2024 showed normal LV/RV function, Grade I DD, mild MR, moderate MAC, aortic valve sclerosis/calcification without stenosis.  Patient reports being off Toprol  for 2 months and  noted increased lower extremity edema and weight gain. He started back 2 weeks ago and stopped Lasix  with improvement in edema and 15 lb weight decrease. He has mild, nonpitting lower extremity edema today. Breath sounds are diminished throughout without wheezing, rales or rhonchi.  Euvolemic and well compensated on exam. - Continue Toprol , isosorbide , Lasix .  A-flutter S/p TEE cardioversion July 2024.  Denies spontaneous bleeding concerns.  He stopped Eliquis  for 2 months secondary to insurance issues. He started back 2 weeks ago. Patient denies palpitations. EKG demonstrates NSR today.  - Continue Toprol  and Eliquis . - CBC today.   Hypertension BP today 130/60. No report of headaches or dizziness.  - Continue Toprol , isosorbide .  Hyperlipidemia LDL 79 in July 2024, not at goal. - Continue atorvastatin . - Repeat lipid panel and CMP.    OSA Patient diagnosed with severe OSA.  He reports he is pending another sleep study, as they could not get the appropriate settings during last sleep study.  - Keep upcoming sleep study.   Tobacco abuse Patient reports smoking 5 cigarettes a day. He no longer smokes cigars.  - Encouraged complete cessation.   GERD Patient reports nighttime epigastric burning that sometimes wakes him from sleep and resolves with Tums. Discussed possibility of acid reflux. Will provide one time Rx for omeprazole . Patient instructed to follow up with PCP.  - Omeprazole  20 mg daily 1 time Rx.  - Follow up with PCP.   Disposition: CBC, CMP, lipid panel today. Increase isosorbide  to 30 mg daily. Add prn SL NTG.          Signed, Barnie HERO. Rebekka Lobello, DNP, NP-C

## 2024-02-17 ENCOUNTER — Encounter: Payer: Self-pay | Admitting: Student

## 2024-02-17 ENCOUNTER — Ambulatory Visit: Attending: Student | Admitting: Student

## 2024-02-17 VITALS — BP 130/60 | HR 70 | Ht 69.0 in | Wt 289.8 lb

## 2024-02-17 DIAGNOSIS — I4892 Unspecified atrial flutter: Secondary | ICD-10-CM | POA: Insufficient documentation

## 2024-02-17 DIAGNOSIS — I5032 Chronic diastolic (congestive) heart failure: Secondary | ICD-10-CM | POA: Diagnosis present

## 2024-02-17 DIAGNOSIS — Z79899 Other long term (current) drug therapy: Secondary | ICD-10-CM | POA: Insufficient documentation

## 2024-02-17 DIAGNOSIS — I10 Essential (primary) hypertension: Secondary | ICD-10-CM | POA: Diagnosis present

## 2024-02-17 DIAGNOSIS — E785 Hyperlipidemia, unspecified: Secondary | ICD-10-CM

## 2024-02-17 DIAGNOSIS — G4733 Obstructive sleep apnea (adult) (pediatric): Secondary | ICD-10-CM | POA: Diagnosis present

## 2024-02-17 DIAGNOSIS — K219 Gastro-esophageal reflux disease without esophagitis: Secondary | ICD-10-CM | POA: Diagnosis present

## 2024-02-17 DIAGNOSIS — Z720 Tobacco use: Secondary | ICD-10-CM

## 2024-02-17 DIAGNOSIS — I2510 Atherosclerotic heart disease of native coronary artery without angina pectoris: Secondary | ICD-10-CM

## 2024-02-17 MED ORDER — ISOSORBIDE MONONITRATE ER 30 MG PO TB24: 30.0000 mg | ORAL_TABLET | Freq: Every day | ORAL | 3 refills | Status: DC

## 2024-02-17 MED ORDER — METOPROLOL SUCCINATE ER 50 MG PO TB24: 150.0000 mg | ORAL_TABLET | Freq: Every day | ORAL | 1 refills | Status: DC

## 2024-02-17 MED ORDER — APIXABAN 5 MG PO TABS: 5.0000 mg | ORAL_TABLET | Freq: Two times a day (BID) | ORAL | 2 refills | Status: DC

## 2024-02-17 MED ORDER — OMEPRAZOLE 20 MG PO CPDR: 20.0000 mg | DELAYED_RELEASE_CAPSULE | Freq: Every day | ORAL | 0 refills | Status: DC

## 2024-02-17 MED ORDER — NITROGLYCERIN 0.4 MG SL SUBL: 0.4000 mg | SUBLINGUAL_TABLET | SUBLINGUAL | 3 refills | Status: AC | PRN

## 2024-02-17 MED ORDER — FUROSEMIDE 40 MG PO TABS: 40.0000 mg | ORAL_TABLET | Freq: Every day | ORAL | 2 refills | Status: DC

## 2024-02-17 NOTE — Patient Instructions (Addendum)
 Medication Instructions:  Your physician recommends the following medication changes.  START TAKING: Omeprazole  20 mg once daily.  30-day prescription sent. Follow up with PCP for refills. Nitroglycerin  0.4 mg as needed for chest pain every 5 minutes.  INCREASE: Isosorbide  Mononitrate (IMDUR ) to 30 mg daily  *If you need a refill on your cardiac medications before your next appointment, please call your pharmacy*  Lab Work: Your provider would like for you to have following labs drawn today CBC, CMET, Lipid Panel.   If you have labs (blood work) drawn today and your tests are completely normal, you will receive your results only by: MyChart Message (if you have MyChart) OR A paper copy in the mail If you have any lab test that is abnormal or we need to change your treatment, we will call you to review the results.  Testing/Procedures: None ordered at this time   Follow-Up: At [facility], you and your health needs are our priority.  As part of our continuing mission to provide you with exceptional heart care, our providers are all part of one team.  This team includes your primary Cardiologist (physician) and Advanced Practice Providers or APPs (Physician Assistants and Nurse Practitioners) who all work together to provide you with the care you need, when you need it.  Your next appointment:   6 month(s)  Provider:   Redell Cave, MD or Barnie Hila, NP    We recommend signing up for the patient portal called MyChart.  Sign up information is provided on this After Visit Summary.  MyChart is used to connect with patients for Virtual Visits (Telemedicine).  Patients are able to view lab/test results, encounter notes, upcoming appointments, etc.  Non-urgent messages can be sent to your provider as well.   To learn more about what you can do with MyChart, go to [email or web address].   Other Instructions  The proper use and anticipated side effects of  nitroglycerine has been carefully explained.  If a single episode of chest pain is not relieved by one tablet, the patient will try another within 5 minutes; and if this doesn't relieve the pain, the patient is instructed to call 911 for transportation to an emergency department, and take third tablet while waiting to go to the emergency department.

## 2024-02-18 LAB — COMPREHENSIVE METABOLIC PANEL WITH GFR
ALT: 15 IU/L (ref 0–44)
AST: 18 IU/L (ref 0–40)
Albumin: 4.7 g/dL (ref 3.8–4.9)
Alkaline Phosphatase: 109 IU/L (ref 44–121)
BUN/Creatinine Ratio: 13 (ref 10–24)
BUN: 14 mg/dL (ref 8–27)
Bilirubin Total: 0.7 mg/dL (ref 0.0–1.2)
CO2: 28 mmol/L (ref 20–29)
Calcium: 9.5 mg/dL (ref 8.6–10.2)
Chloride: 96 mmol/L (ref 96–106)
Creatinine, Ser: 1.12 mg/dL (ref 0.76–1.27)
Globulin, Total: 2.3 g/dL (ref 1.5–4.5)
Glucose: 96 mg/dL (ref 70–99)
Potassium: 4.9 mmol/L (ref 3.5–5.2)
Sodium: 140 mmol/L (ref 134–144)
Total Protein: 7 g/dL (ref 6.0–8.5)
eGFR: 75 mL/min/1.73 (ref >59)

## 2024-02-18 LAB — LIPID PANEL
Chol/HDL Ratio: 5.3 ratio — ABNORMAL HIGH (ref 0.0–5.0)
Cholesterol, Total: 138 mg/dL (ref 100–199)
HDL: 26 mg/dL — ABNORMAL LOW (ref >39)
LDL Chol Calc (NIH): 82 mg/dL (ref 0–99)
Triglycerides: 171 mg/dL — ABNORMAL HIGH (ref 0–149)
VLDL Cholesterol Cal: 30 mg/dL (ref 5–40)

## 2024-02-18 LAB — CBC
Hematocrit: 48.8 % (ref 37.5–51.0)
Hemoglobin: 15.4 g/dL (ref 13.0–17.7)
MCH: 29.6 pg (ref 26.6–33.0)
MCHC: 31.6 g/dL (ref 31.5–35.7)
MCV: 94 fL (ref 79–97)
Platelets: 236 x10E3/uL (ref 150–450)
RBC: 5.21 x10E6/uL (ref 4.14–5.80)
RDW: 12.9 % (ref 11.6–15.4)
WBC: 12.6 x10E3/uL — ABNORMAL HIGH (ref 3.4–10.8)

## 2024-02-19 ENCOUNTER — Ambulatory Visit: Payer: Self-pay | Admitting: Student

## 2024-02-25 NOTE — Progress Notes (Signed)
 noted

## 2024-03-01 ENCOUNTER — Other Ambulatory Visit: Payer: Self-pay

## 2024-03-01 DIAGNOSIS — J441 Chronic obstructive pulmonary disease with (acute) exacerbation: Secondary | ICD-10-CM

## 2024-03-01 DIAGNOSIS — J90 Pleural effusion, not elsewhere classified: Secondary | ICD-10-CM

## 2024-03-01 DIAGNOSIS — R0609 Other forms of dyspnea: Secondary | ICD-10-CM

## 2024-03-29 DIAGNOSIS — G4733 Obstructive sleep apnea (adult) (pediatric): Secondary | ICD-10-CM

## 2024-03-31 ENCOUNTER — Ambulatory Visit: Attending: Sleep Medicine

## 2024-03-31 DIAGNOSIS — G4733 Obstructive sleep apnea (adult) (pediatric): Secondary | ICD-10-CM | POA: Diagnosis present

## 2024-04-06 ENCOUNTER — Other Ambulatory Visit: Payer: Self-pay | Admitting: Student

## 2024-04-11 ENCOUNTER — Other Ambulatory Visit: Payer: Self-pay

## 2024-04-11 ENCOUNTER — Ambulatory Visit (INDEPENDENT_AMBULATORY_CARE_PROVIDER_SITE_OTHER): Payer: Self-pay | Admitting: Sleep Medicine

## 2024-04-11 DIAGNOSIS — G4733 Obstructive sleep apnea (adult) (pediatric): Secondary | ICD-10-CM

## 2024-04-11 NOTE — Progress Notes (Signed)
 Updated titration order has been placed and patient aware that he will be called to be scheduled for new repeat testing.

## 2024-04-14 ENCOUNTER — Other Ambulatory Visit: Payer: Self-pay | Admitting: Cardiology

## 2024-04-19 ENCOUNTER — Telehealth (INDEPENDENT_AMBULATORY_CARE_PROVIDER_SITE_OTHER): Payer: Self-pay | Admitting: Sleep Medicine

## 2024-04-19 NOTE — Telephone Encounter (Signed)
 Study results uploaded, patient has been contacted with results. Recommend repeat study due to optimal pressure not being achieved.

## 2024-05-05 ENCOUNTER — Other Ambulatory Visit: Payer: Self-pay | Admitting: Family

## 2024-05-05 DIAGNOSIS — E782 Mixed hyperlipidemia: Secondary | ICD-10-CM

## 2024-05-10 ENCOUNTER — Ambulatory Visit: Admitting: Pulmonary Disease

## 2024-05-28 ENCOUNTER — Other Ambulatory Visit: Payer: Self-pay | Admitting: Student

## 2024-06-20 ENCOUNTER — Ambulatory Visit: Admitting: Pulmonary Disease

## 2024-06-22 ENCOUNTER — Encounter: Payer: Self-pay | Admitting: Pulmonary Disease

## 2024-06-22 ENCOUNTER — Encounter: Payer: Self-pay | Admitting: Emergency Medicine

## 2024-06-22 ENCOUNTER — Ambulatory Visit: Admitting: Pulmonary Disease

## 2024-06-22 VITALS — BP 140/86 | HR 75 | Temp 98.7°F | Ht 69.0 in | Wt 303.8 lb

## 2024-06-22 DIAGNOSIS — J449 Chronic obstructive pulmonary disease, unspecified: Secondary | ICD-10-CM

## 2024-06-22 DIAGNOSIS — J4489 Other specified chronic obstructive pulmonary disease: Secondary | ICD-10-CM

## 2024-06-22 DIAGNOSIS — F17210 Nicotine dependence, cigarettes, uncomplicated: Secondary | ICD-10-CM

## 2024-06-22 DIAGNOSIS — G4733 Obstructive sleep apnea (adult) (pediatric): Secondary | ICD-10-CM | POA: Diagnosis not present

## 2024-06-22 DIAGNOSIS — I480 Paroxysmal atrial fibrillation: Secondary | ICD-10-CM

## 2024-06-22 DIAGNOSIS — I5030 Unspecified diastolic (congestive) heart failure: Secondary | ICD-10-CM | POA: Diagnosis not present

## 2024-06-22 MED ORDER — FLUTICASONE FUROATE-VILANTEROL 200-25 MCG/ACT IN AEPB: 1.0000 | INHALATION_SPRAY | Freq: Every day | RESPIRATORY_TRACT | 3 refills | Status: DC

## 2024-06-22 NOTE — Progress Notes (Signed)
 Synopsis: Referred in by Corwin Antu, FNP   Subjective:   PATIENT ID: Ryan Hatfield GENDER: male DOB: Nov 06, 1965, MRN: [id number]  Chief Complaint  Patient presents with   COPD    Increased SOB. Cough with clear/yellow sputum. Wheezing.  Trelegy- not using due to insurance. Albuterol - 2-3 times day.     HPI Ryan Hatfield is a 58 year old male patient with a past medical history of paroxysmal A-fib on Eliquis , hypothyroidism on Synthroid  tobacco use disorder, presumed COPD presenting to the pulmonary clinic today for ongoing shortness of breath.  He was recently admitted to [facility] from 07/25-08/01 with shortness of breath lower extremity edema fatigue and lethargy.  He was found to be in CHF exacerbation with BNP 123 chest x-ray with pulmonary vascular congestion and right pleural effusion. Echocardiogram with EF 40 to 45% Diastology could not be assessed. CTA chest with right pleural effusion, increased contrast in hepatic veins.  He was diuresed aggressively and lost 30 pounds of fluids.  He felt well and was discharged on 2 L nasal cannula.  Improved significant on Trelegy ellipta  but remains with significant dizziness lightheadedness on exertion.   PFTs 07/2023 - Sprio with reduced FEV-1 47% of predicted, reduced FVC at 47%of predicted normal ratio and significant response to bronchodilators. Reduced PFR Lung volumes suggestive of air trapping. Mildly reduced DLCO.   11/02/2023 Feels overall better today with improved dyspnea. Contiues with Trelegy Ellipta .   Family history -he denies any family history of pulmonary diseases  Social history -smokes 5 cigarettes a day started at 58 years old.  Denies any alcohol or illicit drug use.  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  OV 06/22/2024 - Ryan Hatfield is here to follow up on his respiratory failure. He is not taking Trelegy as it was not covered by his insurance. I explained to him that he needs to be on a  daily inhaler as his PFTs  were suggestive of COPD with asthma overlap. He is agreeable and therefore will start him on Breo. I will also have him see Dr. Jess for severe OSA/CSA. Unfortunately he continues to smoke but is willing to try quitting.   ROS All systems were reviewed and are negative except for the above.  Objective:   Vitals:   06/22/24 1524  BP: (!) 140/86  Pulse: 75  Temp: 98.7 F (37.1 C)  SpO2: 92%  Weight: (!) 303 lb 12.8 oz (137.8 kg)  Height: 5' 9 (1.753 m)     92% on RA BMI Readings from Last 3 Encounters:  06/22/24 44.86 kg/m  02/17/24 42.80 kg/m  11/02/23 43.33 kg/m   Wt Readings from Last 3 Encounters:  06/22/24 (!) 303 lb 12.8 oz (137.8 kg)  02/17/24 289 lb 12.8 oz (131.5 kg)  11/02/23 293 lb 6.4 oz (133.1 kg)    Physical Exam GEN: NAD, Morbidly obese HEENT: Supple Neck, Reactive Pupils, EOMI  CVS: Normal S1, Normal S2, RRR, No murmurs or ES appreciated  Lungs: Bibasilar crackles noted Abdomen: Soft, non tender, non distended, + BS  Extremities: Warm and well perfused, No edema   Labs and imaging were reviewed.   Ancillary Information   CBC    Component Value Date/Time   WBC 12.6 (H) 02/17/2024 0857   WBC 9.9 02/05/2023 0055   RBC 5.21 02/17/2024 0857   RBC 5.30 02/05/2023 0055   HGB 15.4 02/17/2024 0857   HCT 48.8 02/17/2024 0857   PLT 236 02/17/2024 0857   MCV 94  02/17/2024 0857   MCH 29.6 02/17/2024 0857   MCH 28.7 02/05/2023 0055   MCHC 31.6 02/17/2024 0857   MCHC 31.1 02/05/2023 0055   RDW 12.9 02/17/2024 0857   LYMPHSABS 2.7 09/05/2021 0820   MONOABS 0.8 09/05/2021 0820   EOSABS 0.3 09/05/2021 0820   BASOSABS 0.1 09/05/2021 0820    Imaging  CXR 07/25: *Mild pulmonary vascular congestion. Probable small right pleural effusion with associated compressive atelectasis.   CTA Chest 01/29/2023:  No pneumothorax is noted. Small right pleural effusion is noted with adjacent atelectasis of the right lower lobe.  Left lung is unremarkable.  TEE 02/04/2023:   1. Limited study, transgastric views not obtained as patient developed  hypoxia and procedure was aborted   2. Left ventricular ejection fraction, by estimation, is 40 to 45%. The  left ventricle has mildly decreased function.   3. Right ventricular systolic function is mildly reduced. The right  ventricular size is normal.   4. Left atrial size was mildly dilated. No left atrial/left atrial  appendage thrombus was detected.   5. Right atrial size was mildly dilated.   6. The mitral valve is normal in structure. Moderate mitral valve  regurgitation.   7. The aortic valve is tricuspid. Aortic valve regurgitation is trivial.       Latest Ref Rng & Units 07/21/2023    9:48 AM  PFT Results  FVC-Pre L 2.19   FVC-Predicted Pre % 47   FVC-Post L 2.43   FVC-Predicted Post % 52   Pre FEV1/FVC % % 76   Post FEV1/FCV % % 72   FEV1-Pre L 1.65   FEV1-Predicted Pre % 47   FEV1-Post L 1.74   DLCO uncorrected ml/min/mmHg 19.98   DLCO UNC% % 74   DLVA Predicted % 88   TLC L 5.90   TLC % Predicted % 86   RV % Predicted % 148      Assessment & Plan:  Ryan Hatfield is a 58 year old male patient with a past medical history of paroxysmal A-fib on Eliquis , hypothyroidism on Synthroid  tobacco use disorder, presumed COPD presenting to the pulmonary clinic today for ongoing shortness of breath.   #COPD/Asthma Overlap   #HFpEF dry weigh 260lbs  #Paroxysmal A.fib on eliquis   #Severe OSA AHI 42.5 with CSA and severe desaturation.  #Tobacco use disorder   6 min walk test 185 meters significantly reduced.   []  Start Breo Ellipta  200 1 puff daily.  []  Albuterol  Inh 2 puffs Q6H as needed  []  Enroll in LDCT scan program  []  Titration sleep study and follow up with Dr. Jess.  RTC 6 months.   I personally spent a total of 30 minutes in the care of the patient today including preparing to see the patient, getting/reviewing separately obtained history,  performing a medically appropriate exam/evaluation, counseling and educating, placing orders, documenting clinical information in the EHR, independently interpreting results, and communicating results.   Darrin Barn, MD [facility] 06/22/2024 5:23 PM

## 2024-07-12 ENCOUNTER — Ambulatory Visit: Admitting: Sleep Medicine

## 2024-07-13 ENCOUNTER — Other Ambulatory Visit: Payer: Self-pay

## 2024-07-13 ENCOUNTER — Encounter: Payer: Self-pay | Admitting: Emergency Medicine

## 2024-07-13 ENCOUNTER — Inpatient Hospital Stay
Admission: EM | Admit: 2024-07-13 | Discharge: 2024-07-25 | DRG: 291 | Disposition: A | Discharge status: Home Health | Attending: Internal Medicine | Admitting: Internal Medicine

## 2024-07-13 ENCOUNTER — Emergency Department

## 2024-07-13 DIAGNOSIS — I5032 Chronic diastolic (congestive) heart failure: Secondary | ICD-10-CM | POA: Diagnosis present

## 2024-07-13 DIAGNOSIS — I4819 Other persistent atrial fibrillation: Secondary | ICD-10-CM | POA: Diagnosis present

## 2024-07-13 DIAGNOSIS — E785 Hyperlipidemia, unspecified: Secondary | ICD-10-CM | POA: Diagnosis present

## 2024-07-13 DIAGNOSIS — Z7901 Long term (current) use of anticoagulants: Secondary | ICD-10-CM

## 2024-07-13 DIAGNOSIS — J440 Chronic obstructive pulmonary disease with acute lower respiratory infection: Secondary | ICD-10-CM | POA: Diagnosis present

## 2024-07-13 DIAGNOSIS — Z79899 Other long term (current) drug therapy: Secondary | ICD-10-CM

## 2024-07-13 DIAGNOSIS — Z6841 Body Mass Index (BMI) 40.0 and over, adult: Secondary | ICD-10-CM | POA: Diagnosis not present

## 2024-07-13 DIAGNOSIS — I2510 Atherosclerotic heart disease of native coronary artery without angina pectoris: Secondary | ICD-10-CM | POA: Diagnosis not present

## 2024-07-13 DIAGNOSIS — Z8261 Family history of arthritis: Secondary | ICD-10-CM

## 2024-07-13 DIAGNOSIS — G9341 Metabolic encephalopathy: Secondary | ICD-10-CM | POA: Diagnosis not present

## 2024-07-13 DIAGNOSIS — G8929 Other chronic pain: Secondary | ICD-10-CM

## 2024-07-13 DIAGNOSIS — F1011 Alcohol abuse, in remission: Secondary | ICD-10-CM | POA: Diagnosis present

## 2024-07-13 DIAGNOSIS — G4733 Obstructive sleep apnea (adult) (pediatric): Secondary | ICD-10-CM | POA: Diagnosis not present

## 2024-07-13 DIAGNOSIS — I3481 Nonrheumatic mitral (valve) annulus calcification: Secondary | ICD-10-CM | POA: Diagnosis present

## 2024-07-13 DIAGNOSIS — I10 Essential (primary) hypertension: Secondary | ICD-10-CM | POA: Diagnosis present

## 2024-07-13 DIAGNOSIS — E039 Hypothyroidism, unspecified: Secondary | ICD-10-CM | POA: Diagnosis present

## 2024-07-13 DIAGNOSIS — R739 Hyperglycemia, unspecified: Secondary | ICD-10-CM | POA: Diagnosis present

## 2024-07-13 DIAGNOSIS — I5031 Acute diastolic (congestive) heart failure: Secondary | ICD-10-CM | POA: Diagnosis not present

## 2024-07-13 DIAGNOSIS — F1010 Alcohol abuse, uncomplicated: Secondary | ICD-10-CM | POA: Diagnosis not present

## 2024-07-13 DIAGNOSIS — J189 Pneumonia, unspecified organism: Secondary | ICD-10-CM | POA: Diagnosis not present

## 2024-07-13 DIAGNOSIS — T380X5A Adverse effect of glucocorticoids and synthetic analogues, initial encounter: Secondary | ICD-10-CM | POA: Diagnosis present

## 2024-07-13 DIAGNOSIS — I482 Chronic atrial fibrillation, unspecified: Secondary | ICD-10-CM | POA: Diagnosis not present

## 2024-07-13 DIAGNOSIS — J9622 Acute and chronic respiratory failure with hypercapnia: Secondary | ICD-10-CM | POA: Diagnosis not present

## 2024-07-13 DIAGNOSIS — Z716 Tobacco abuse counseling: Secondary | ICD-10-CM

## 2024-07-13 DIAGNOSIS — F17210 Nicotine dependence, cigarettes, uncomplicated: Secondary | ICD-10-CM | POA: Diagnosis present

## 2024-07-13 DIAGNOSIS — G928 Other toxic encephalopathy: Secondary | ICD-10-CM | POA: Diagnosis not present

## 2024-07-13 DIAGNOSIS — Z720 Tobacco use: Secondary | ICD-10-CM | POA: Diagnosis not present

## 2024-07-13 DIAGNOSIS — J449 Chronic obstructive pulmonary disease, unspecified: Secondary | ICD-10-CM

## 2024-07-13 DIAGNOSIS — I4892 Unspecified atrial flutter: Secondary | ICD-10-CM | POA: Diagnosis present

## 2024-07-13 DIAGNOSIS — Z823 Family history of stroke: Secondary | ICD-10-CM

## 2024-07-13 DIAGNOSIS — E875 Hyperkalemia: Secondary | ICD-10-CM | POA: Diagnosis present

## 2024-07-13 DIAGNOSIS — E874 Mixed disorder of acid-base balance: Secondary | ICD-10-CM | POA: Diagnosis not present

## 2024-07-13 DIAGNOSIS — Z888 Allergy status to other drugs, medicaments and biological substances status: Secondary | ICD-10-CM

## 2024-07-13 DIAGNOSIS — Z79890 Hormone replacement therapy (postmenopausal): Secondary | ICD-10-CM

## 2024-07-13 DIAGNOSIS — I480 Paroxysmal atrial fibrillation: Secondary | ICD-10-CM | POA: Diagnosis present

## 2024-07-13 DIAGNOSIS — J9811 Atelectasis: Secondary | ICD-10-CM | POA: Diagnosis present

## 2024-07-13 DIAGNOSIS — D72823 Leukemoid reaction: Secondary | ICD-10-CM | POA: Diagnosis present

## 2024-07-13 DIAGNOSIS — F32A Depression, unspecified: Secondary | ICD-10-CM | POA: Diagnosis present

## 2024-07-13 DIAGNOSIS — T597X1A Toxic effect of carbon dioxide, accidental (unintentional), initial encounter: Secondary | ICD-10-CM | POA: Diagnosis not present

## 2024-07-13 DIAGNOSIS — E870 Hyperosmolality and hypernatremia: Secondary | ICD-10-CM | POA: Diagnosis present

## 2024-07-13 DIAGNOSIS — F17200 Nicotine dependence, unspecified, uncomplicated: Secondary | ICD-10-CM | POA: Diagnosis present

## 2024-07-13 DIAGNOSIS — Z1152 Encounter for screening for COVID-19: Secondary | ICD-10-CM | POA: Diagnosis not present

## 2024-07-13 DIAGNOSIS — I272 Pulmonary hypertension, unspecified: Secondary | ICD-10-CM | POA: Diagnosis present

## 2024-07-13 DIAGNOSIS — R7989 Other specified abnormal findings of blood chemistry: Secondary | ICD-10-CM | POA: Diagnosis not present

## 2024-07-13 DIAGNOSIS — I4891 Unspecified atrial fibrillation: Secondary | ICD-10-CM | POA: Diagnosis not present

## 2024-07-13 DIAGNOSIS — Z833 Family history of diabetes mellitus: Secondary | ICD-10-CM

## 2024-07-13 DIAGNOSIS — I5033 Acute on chronic diastolic (congestive) heart failure: Secondary | ICD-10-CM | POA: Diagnosis present

## 2024-07-13 DIAGNOSIS — E538 Deficiency of other specified B group vitamins: Secondary | ICD-10-CM

## 2024-07-13 DIAGNOSIS — E782 Mixed hyperlipidemia: Secondary | ICD-10-CM | POA: Diagnosis not present

## 2024-07-13 DIAGNOSIS — J441 Chronic obstructive pulmonary disease with (acute) exacerbation: Secondary | ICD-10-CM | POA: Diagnosis present

## 2024-07-13 DIAGNOSIS — I110 Hypertensive heart disease with heart failure: Principal | ICD-10-CM | POA: Diagnosis present

## 2024-07-13 DIAGNOSIS — I2489 Other forms of acute ischemic heart disease: Secondary | ICD-10-CM | POA: Diagnosis present

## 2024-07-13 DIAGNOSIS — E878 Other disorders of electrolyte and fluid balance, not elsewhere classified: Secondary | ICD-10-CM | POA: Diagnosis present

## 2024-07-13 DIAGNOSIS — M545 Low back pain, unspecified: Secondary | ICD-10-CM

## 2024-07-13 DIAGNOSIS — R0602 Shortness of breath: Principal | ICD-10-CM

## 2024-07-13 DIAGNOSIS — Z8042 Family history of malignant neoplasm of prostate: Secondary | ICD-10-CM

## 2024-07-13 DIAGNOSIS — E66813 Obesity, class 3: Secondary | ICD-10-CM | POA: Diagnosis present

## 2024-07-13 DIAGNOSIS — J9621 Acute and chronic respiratory failure with hypoxia: Secondary | ICD-10-CM | POA: Diagnosis present

## 2024-07-13 DIAGNOSIS — Z8249 Family history of ischemic heart disease and other diseases of the circulatory system: Secondary | ICD-10-CM

## 2024-07-13 DIAGNOSIS — Z91148 Patient's other noncompliance with medication regimen for other reason: Secondary | ICD-10-CM

## 2024-07-13 DIAGNOSIS — E873 Alkalosis: Secondary | ICD-10-CM | POA: Diagnosis not present

## 2024-07-13 DIAGNOSIS — Z9981 Dependence on supplemental oxygen: Secondary | ICD-10-CM

## 2024-07-13 DIAGNOSIS — Z8051 Family history of malignant neoplasm of kidney: Secondary | ICD-10-CM

## 2024-07-13 LAB — CBC WITH DIFFERENTIAL/PLATELET
Abs Immature Granulocytes: 0.06 K/uL (ref 0.00–0.07)
Basophils Absolute: 0 K/uL (ref 0.0–0.1)
Basophils Relative: 0 %
Eosinophils Absolute: 0 K/uL (ref 0.0–0.5)
Eosinophils Relative: 0 %
HCT: 44.5 % (ref 39.0–52.0)
Hemoglobin: 14.3 g/dL (ref 13.0–17.0)
Immature Granulocytes: 1 %
Lymphocytes Relative: 6 %
Lymphs Abs: 0.7 K/uL (ref 0.7–4.0)
MCH: 29.9 pg (ref 26.0–34.0)
MCHC: 32.1 g/dL (ref 30.0–36.0)
MCV: 92.9 fL (ref 80.0–100.0)
Monocytes Absolute: 0.8 K/uL (ref 0.1–1.0)
Monocytes Relative: 8 %
Neutro Abs: 8.9 K/uL — ABNORMAL HIGH (ref 1.7–7.7)
Neutrophils Relative %: 85 %
Platelets: 198 K/uL (ref 150–400)
RBC: 4.79 MIL/uL (ref 4.22–5.81)
RDW: 12.8 % (ref 11.5–15.5)
WBC: 10.4 K/uL (ref 4.0–10.5)
nRBC: 0 % (ref 0.0–0.2)

## 2024-07-13 LAB — BASIC METABOLIC PANEL WITH GFR
Anion gap: 11 (ref 5–15)
BUN: 12 mg/dL (ref 6–20)
CO2: 37 mmol/L — ABNORMAL HIGH (ref 22–32)
Calcium: 9 mg/dL (ref 8.9–10.3)
Chloride: 91 mmol/L — ABNORMAL LOW (ref 98–111)
Creatinine, Ser: 1.04 mg/dL (ref 0.61–1.24)
GFR, Estimated: >60 mL/min
Glucose, Bld: 114 mg/dL — ABNORMAL HIGH (ref 70–99)
Potassium: 4.4 mmol/L (ref 3.5–5.1)
Sodium: 138 mmol/L (ref 135–145)

## 2024-07-13 LAB — TROPONIN T, HIGH SENSITIVITY
Troponin T High Sensitivity: 20 ng/L — ABNORMAL HIGH (ref 0–19)
Troponin T High Sensitivity: 21 ng/L — ABNORMAL HIGH (ref 0–19)

## 2024-07-13 LAB — RESP PANEL BY RT-PCR (RSV, FLU A&B, COVID)  RVPGX2
Influenza A by PCR: NEGATIVE
Influenza B by PCR: NEGATIVE
Resp Syncytial Virus by PCR: NEGATIVE
SARS Coronavirus 2 by RT PCR: NEGATIVE

## 2024-07-13 LAB — PRO BRAIN NATRIURETIC PEPTIDE: Pro Brain Natriuretic Peptide: 527 pg/mL — ABNORMAL HIGH

## 2024-07-13 MED ORDER — ALBUTEROL SULFATE (2.5 MG/3ML) 0.083% IN NEBU: 2.5000 mg | INHALATION_SOLUTION | RESPIRATORY_TRACT | Status: DC | PRN

## 2024-07-13 MED ORDER — NICOTINE 21 MG/24HR TD PT24
21.0000 mg | MEDICATED_PATCH | Freq: Every day | TRANSDERMAL | Status: DC
  Administered 2024-07-13 – 2024-07-24 (×12): 21 mg via TRANSDERMAL
  Filled 2024-07-13 (×12): qty 1

## 2024-07-13 MED ORDER — FOLIC ACID 1 MG PO TABS
1.0000 mg | ORAL_TABLET | Freq: Every day | ORAL | Status: DC
  Administered 2024-07-13 – 2024-07-25 (×13): 1 mg via ORAL
  Filled 2024-07-13 (×13): qty 1

## 2024-07-13 MED ORDER — PANTOPRAZOLE SODIUM 40 MG PO TBEC
40.0000 mg | DELAYED_RELEASE_TABLET | Freq: Every day | ORAL | Status: DC
  Administered 2024-07-13 – 2024-07-25 (×13): 40 mg via ORAL
  Filled 2024-07-13 (×13): qty 1

## 2024-07-13 MED ORDER — LEVOTHYROXINE SODIUM 50 MCG PO TABS
50.0000 ug | ORAL_TABLET | Freq: Every day | ORAL | Status: DC
  Administered 2024-07-14 – 2024-07-25 (×12): 50 ug via ORAL
  Filled 2024-07-13 (×12): qty 1

## 2024-07-13 MED ORDER — METHYLPREDNISOLONE SODIUM SUCC 125 MG IJ SOLR
125.0000 mg | Freq: Once | INTRAMUSCULAR | Status: AC
  Administered 2024-07-13: 125 mg via INTRAVENOUS
  Filled 2024-07-13: qty 2

## 2024-07-13 MED ORDER — METOPROLOL SUCCINATE ER 50 MG PO TB24
150.0000 mg | ORAL_TABLET | Freq: Every day | ORAL | Status: DC
  Administered 2024-07-13 – 2024-07-15 (×2): 150 mg via ORAL
  Filled 2024-07-13: qty 1
  Filled 2024-07-13: qty 3

## 2024-07-13 MED ORDER — GABAPENTIN 100 MG PO CAPS: 100.0000 mg | ORAL_CAPSULE | Freq: Three times a day (TID) | ORAL | Status: DC

## 2024-07-13 MED ORDER — IPRATROPIUM-ALBUTEROL 0.5-2.5 (3) MG/3ML IN SOLN
3.0000 mL | Freq: Once | RESPIRATORY_TRACT | Status: AC
  Administered 2024-07-13: 3 mL via RESPIRATORY_TRACT
  Filled 2024-07-13: qty 3

## 2024-07-13 MED ORDER — ATORVASTATIN CALCIUM 20 MG PO TABS: 40.0000 mg | ORAL_TABLET | Freq: Every day | ORAL | Status: DC

## 2024-07-13 MED ORDER — APIXABAN 5 MG PO TABS
5.0000 mg | ORAL_TABLET | Freq: Two times a day (BID) | ORAL | Status: DC
  Administered 2024-07-13 – 2024-07-16 (×7): 5 mg via ORAL
  Filled 2024-07-13 (×8): qty 1

## 2024-07-13 MED ORDER — ISOSORBIDE MONONITRATE ER 30 MG PO TB24
30.0000 mg | ORAL_TABLET | Freq: Every day | ORAL | Status: DC
  Administered 2024-07-13 – 2024-07-15 (×3): 30 mg via ORAL
  Filled 2024-07-13 (×3): qty 1

## 2024-07-13 MED ORDER — FUROSEMIDE 10 MG/ML IJ SOLN
60.0000 mg | Freq: Once | INTRAMUSCULAR | Status: AC
  Administered 2024-07-13: 60 mg via INTRAVENOUS
  Filled 2024-07-13: qty 8

## 2024-07-13 MED ORDER — DM-GUAIFENESIN ER 30-600 MG PO TB12: 1.0000 | ORAL_TABLET | Freq: Two times a day (BID) | ORAL | Status: DC | PRN

## 2024-07-13 MED ORDER — IPRATROPIUM-ALBUTEROL 0.5-2.5 (3) MG/3ML IN SOLN
3.0000 mL | RESPIRATORY_TRACT | Status: DC
  Administered 2024-07-13 – 2024-07-16 (×16): 3 mL via RESPIRATORY_TRACT
  Filled 2024-07-13 (×16): qty 3

## 2024-07-13 MED ORDER — NITROGLYCERIN 0.4 MG SL SUBL: 0.4000 mg | SUBLINGUAL_TABLET | SUBLINGUAL | Status: DC | PRN

## 2024-07-13 NOTE — ED Provider Notes (Signed)
 "  [facility] Provider Note    Event Date/Time   First MD Initiated Contact with Patient 07/13/24 1829     (approximate)   History   Shortness of Breath   HPI  Ryan Hatfield is a 59 y.o. male who presents to the emergency department today because concerns for shortness of breath.  Started a few days ago.  Has progressively got worse.  Patient is on 2 L of oxygen  at baseline.  He states he has not really gotten out of bed over the past 2 days.  He does have some chest discomfort with this.  Has had a cough.  States that family members have been sick recently.  He states that he does not currently have any inhalers or nebulizers at home secondary to insurance issues.  Has not noticed any lower extremity edema.     Physical Exam   Triage Vital Signs: ED Triage Vitals  Encounter Vitals Group     BP 07/13/24 1833 (!) 161/63     Girls Systolic BP Percentile --      Girls Diastolic BP Percentile --      Boys Systolic BP Percentile --      Boys Diastolic BP Percentile --      Pulse Rate 07/13/24 1833 (!) 101     Resp 07/13/24 1833 (!) 21     Temp 07/13/24 1833 98 F (36.7 C)     Temp Source 07/13/24 1833 Oral     SpO2 07/13/24 1833 93 %     Weight 07/13/24 1834 (!) 302 lb 0.5 oz (137 kg)     Height --      Head Circumference --      Peak Flow --      Pain Score 07/13/24 1834 0     Pain Loc --      Pain Education --      Exclude from Growth Chart --     Most recent vital signs: Vitals:   07/13/24 1833  BP: (!) 161/63  Pulse: (!) 101  Resp: (!) 21  Temp: 98 F (36.7 C)  SpO2: 93%   General: Awake, alert, oriented. CV:  Good peripheral perfusion. Tachycardia. Resp:  Increased work of breathing.  Tachypnea.  Poor air movement. Abd:  No distention.   ED Results / Procedures / Treatments   Labs (all labs ordered are listed, but only abnormal results are displayed) Labs Reviewed  CBC WITH DIFFERENTIAL/PLATELET - Abnormal; Notable for the  following components:      Result Value   Neutro Abs 8.9 (*)    All other components within normal limits  BASIC METABOLIC PANEL WITH GFR - Abnormal; Notable for the following components:   Chloride 91 (*)    CO2 37 (*)    Glucose, Bld 114 (*)    All other components within normal limits  PRO BRAIN NATRIURETIC PEPTIDE - Abnormal; Notable for the following components:   Pro Brain Natriuretic Peptide 527.0 (*)    All other components within normal limits  TROPONIN T, HIGH SENSITIVITY - Abnormal; Notable for the following components:   Troponin T High Sensitivity 20 (*)    All other components within normal limits  RESP PANEL BY RT-PCR (RSV, FLU A&B, COVID)  RVPGX2  RESPIRATORY PANEL BY PCR  EXPECTORATED SPUTUM ASSESSMENT W GRAM STAIN, RFLX TO RESP C  HIV ANTIBODY (ROUTINE TESTING W REFLEX)  BASIC METABOLIC PANEL WITH GFR  CBC  MAGNESIUM  TROPONIN T, HIGH  SENSITIVITY     EKG  I, Guadalupe Eagles, attending physician, personally viewed and interpreted this EKG  EKG Time: 1832 Rate: 96 Rhythm: sinus rhythm Axis: normal Intervals: qtc 449 QRS: narrow, q waves v1, v2 ST changes: no st elevation Impression: abnormal ekg   RADIOLOGY I independently interpreted and visualized the CXR. My interpretation: No pneumonia Radiology interpretation:  IMPRESSION:  1. Mild cardiomegaly.  2. Bibasilar atelectasis.   PROCEDURES:  Critical Care performed: Yes  CRITICAL CARE Performed by: Guadalupe Eagles   Total critical care time: 30 minutes  Critical care time was exclusive of separately billable procedures and treating other patients.  Critical care was necessary to treat or prevent imminent or life-threatening deterioration.  Critical care was time spent personally by me on the following activities: development of treatment plan with patient and/or surrogate as well as nursing, discussions with consultants, evaluation of patient's response to treatment, examination of patient,  obtaining history from patient or surrogate, ordering and performing treatments and interventions, ordering and review of laboratory studies, ordering and review of radiographic studies, pulse oximetry and re-evaluation of patient's condition.   Procedures    MEDICATIONS ORDERED IN ED: Medications - No data to display   IMPRESSION / MDM / ASSESSMENT AND PLAN / ED COURSE  I reviewed the triage vital signs and the nursing notes.                              Differential diagnosis includes, but is not limited to, COPD, CHF, pneumonia, viral illness, anemia  Patient's presentation is most consistent with acute presentation with potential threat to life or bodily function.   The patient is on the cardiac monitor to evaluate for evidence of arrhythmia and/or significant heart rate changes.  Patient presented to the emergency department today via EMS because of concerns for shortness of breath.  Patient is on oxygen  at baseline.  On initial exam patient did have increased work of breathing.  Given significant hypoxia and increased work of breathing I did have patient placed on BiPAP.  He did feel improved after BiPAP.  Chest x-ray without findings concerning for pneumonia.  Blood work here without leukocytosis.  Slight BNP elevation.  At this time I do think slight CHF and COPD exacerbation likely.  Discussed with Dr. Hilma with the hospitalist service who will evaluate for admission.     FINAL CLINICAL IMPRESSION(S) / ED DIAGNOSES   Final diagnoses:  Shortness of breath     Note:  This document was prepared using Dragon voice recognition software and may include unintentional dictation errors.    Eagles Guadalupe, MD 07/13/24 2112  "

## 2024-07-13 NOTE — H&P (Addendum)
 " History and Physical    [name] FMW:[id number] DOB: 09-03-1965 DOA: 07/13/2024  Referring MD/NP/PA:   PCP: Corwin Antu, FNP   Patient coming from:  The patient is coming from home.     Chief Complaint: SOB  HPI: Ryan Hatfield is a 59 y.o. male with medical history significant of dCHF, HTN, HLD, COPD on 2L oxygen , hypothyroidism, GERD, depression with anxiety, A-fib on Eliquis , morbid obesity, smoker, alcohol abuse in remission, medication noncompliance, who presents with SOB.  Patient states that he is not taking most of his medications including Eliquis  for a while due to insurance issue.  He developed SOB in the past 2 days, which has been progressively worsening.  He has cough with clear mucus production, no chest pain, fever or chills.  No nausea, vomiting, diarrhea or abdominal pain.  No symptoms of UTI.  Patient is normally using 2 L oxygen  at baseline, but was found to have oxygen  desaturation to 76% on home level 2 L oxygen , initially started 6-8 L oxygen , still has respiratory distress and difficulty speaking in full sentence, then started BiPAP with improvement.  BiPAP is weaned off now, currently on 6 L oxygen  with 100% of saturation.  Data reviewed independently and ED Course: pt was found to have BNP 527, GFR> 60, troponin 20 --> 21, negative PCR for COVID, flu and RSV. Temperature normal, blood pressure 158/62, heart rate 101 --> 84, RR 21.  Chest x-ray showed cardiomegaly and bibasilar atelectasis.  Patient is admitted to PCU as inpatient.  EKG: I have personally reviewed.  Sinus rhythm, QTc 449.   Review of Systems:   General: no fevers, chills, no body weight gain, has fatigue HEENT: no blurry vision, hearing changes or sore throat Respiratory: has dyspnea, coughing, wheezing CV: no chest pain, no palpitations GI: no nausea, vomiting, abdominal pain, diarrhea, constipation GU: no dysuria, burning on urination, increased urinary frequency, hematuria   Ext: has mild leg edema Neuro: no unilateral weakness, numbness, or tingling, no vision change or hearing loss Skin: no rash, no skin tear. MSK: No muscle spasm, no deformity, no limitation of range of movement in spin Heme: No easy bruising.  Travel history: No recent long distant travel.   Allergy: Allergies[1]  Past Medical History:  Diagnosis Date   Acute appendicitis    Alcohol abuse, in remission 05/29/2015   Anxiety and depression 05/29/2015   Arthritis, degenerative 02/03/2013   Overview:  Of right middle finger MCP joint    Cannot sleep 02/03/2013   Chest pain 01/15/2013   Hypertension    Plantar fasciitis of left foot 01/17/2016    Past Surgical History:  Procedure Laterality Date   CARDIOVERSION N/A 02/04/2023   Procedure: CARDIOVERSION;  Surgeon: Kate Lonni CROME, MD;  Location: [facility] INVASIVE CV LAB;  Service: Cardiovascular;  Laterality: N/A;   LAPAROSCOPIC APPENDECTOMY N/A 11/25/2017   Procedure: APPENDECTOMY LAPAROSCOPIC;  Surgeon: Wonda Charlie BRAVO, MD;  Location: [facility] ORS;  Service: General;  Laterality: N/A;   RIGHT/LEFT HEART CATH AND CORONARY ANGIOGRAPHY N/A 02/02/2023   Procedure: RIGHT/LEFT HEART CATH AND CORONARY ANGIOGRAPHY;  Surgeon: Burnard Debby LABOR, MD;  Location: [facility] INVASIVE CV LAB;  Service: Cardiovascular;  Laterality: N/A;   SEPTOPLASTY     TEE WITHOUT CARDIOVERSION N/A 02/04/2023   Procedure: TRANSESOPHAGEAL ECHOCARDIOGRAM;  Surgeon: Kate Lonni CROME, MD;  Location: [facility] INVASIVE CV LAB;  Service: Cardiovascular;  Laterality: N/A;   TONSILLECTOMY      Social History:  reports that he has been  smoking cigarettes. He has a 40 pack-year smoking history. He has never used smokeless tobacco. He reports current alcohol use. He reports that he does not use drugs.  Family History:  Family History  Problem Relation Age of Onset   Heart disease Mother    Arthritis Mother    Heart disease Father    Diabetes Brother    Stroke Brother    Cancer  Maternal Grandmother    Prostate cancer Paternal Grandfather    Kidney cancer Paternal Uncle      Prior to Admission medications  Medication Sig Start Date End Date Taking? Authorizing Provider  albuterol  (VENTOLIN  HFA) 108 (90 Base) MCG/ACT inhaler TAKE 2 PUFFS BY MOUTH EVERY 6 HOURS AS NEEDED FOR WHEEZE OR SHORTNESS OF BREATH 08/12/23   Assaker, Darrin, MD  apixaban  (ELIQUIS ) 5 MG TABS tablet Take 1 tablet (5 mg total) by mouth 2 (two) times daily. 02/17/24 02/16/25  Loistine Sober, NP  atorvastatin  (LIPITOR) 40 MG tablet Take 1 tablet (40 mg total) by mouth daily. 09/26/22   Corwin Antu, FNP  fluticasone  furoate-vilanterol (BREO ELLIPTA ) 200-25 MCG/ACT AEPB Inhale 1 puff into the lungs daily. 06/22/24   Assaker, Darrin, MD  folic acid  (FOLVITE ) 1 MG tablet Take 1 tablet (1 mg total) by mouth daily. 02/05/23   Pokhrel, Laxman, MD  furosemide  (LASIX ) 40 MG tablet Take 1 tablet (40 mg total) by mouth daily. 02/17/24 02/16/25  Loistine Sober, NP  gabapentin  (NEURONTIN ) 100 MG capsule Take 1 capsule (100 mg total) by mouth 3 (three) times daily. 04/02/23   Corwin Antu, FNP  isosorbide  mononitrate (IMDUR ) 30 MG 24 hr tablet TAKE 1/2 OF A TABLET (15 MG TOTAL) BY MOUTH DAILY 04/14/24   Loistine Sober, NP  levothyroxine  (SYNTHROID ) 50 MCG tablet TAKE 1 TABLET BY MOUTH EVERY DAY 10/12/23   Corwin Antu, FNP  metoprolol  succinate (TOPROL -XL) 50 MG 24 hr tablet Take 3 tablets (150 mg total) by mouth daily. Take with or immediately following a meal. 05/31/24   Wittenborn, Sober, NP  nicotine  (NICODERM CQ  - DOSED IN MG/24 HOURS) 14 mg/24hr patch Place 1 patch (14 mg total) onto the skin daily. 04/02/23   Dugal, Tabitha, FNP  nitroGLYCERIN  (NITROSTAT ) 0.4 MG SL tablet Place 1 tablet (0.4 mg total) under the tongue every 5 (five) minutes as needed for chest pain. 02/17/24 06/22/24  Loistine Sober, NP  omeprazole  (PRILOSEC) 20 MG capsule TAKE 1 CAPSULE BY MOUTH EVERY DAY 04/06/24    Loistine Sober, NP    Physical Exam: Vitals:   07/14/24 0500 07/14/24 0624 07/14/24 0725 07/14/24 1112  BP: 112/70 (!) 115/46  (!) 129/49  Pulse: 60 60  77  Resp: 19 (!) 22  (!) 21  Temp:  98.2 F (36.8 C)  98.6 F (37 C)  TempSrc:  Oral  Oral  SpO2: 94% 93% 98% 91%  Weight:      Height:       General: Not in acute distress HEENT:       Eyes: PERRL, EOMI, no jaundice       ENT: No discharge from the ears and nose, no pharynx injection, no tonsillar enlargement.        Neck: Difficult to assess JVD due to morbid obesity,, no bruit, no mass felt. Heme: No neck lymph node enlargement. Cardiac: S1/S2, RRR, No murmurs, No gallops or rubs. Respiratory: has wheezing and crackles bilaterally GI: Soft, nondistended, nontender, no rebound pain, no organomegaly, BS present. GU: No hematuria Ext: has 1+ leg edema  bilaterally. 1+DP/PT pulse bilaterally. Musculoskeletal: No joint deformities, No joint redness or warmth, no limitation of ROM in spin. Skin: No rashes.  Neuro: Alert, oriented X3, cranial nerves II-XII grossly intact, moves all extremities normally.  Psych: Patient is not psychotic, no suicidal or hemocidal ideation.  Labs on Admission: I have personally reviewed following labs and imaging studies  CBC: Recent Labs  Lab 07/13/24 1835 07/14/24 0448  WBC 10.4 9.5  NEUTROABS 8.9*  --   HGB 14.3 14.3  HCT 44.5 44.5  MCV 92.9 93.5  PLT 198 198   Basic Metabolic Panel: Recent Labs  Lab 07/13/24 1835 07/14/24 0448  NA 138 137  K 4.4 4.8  CL 91* 90*  CO2 37* 39*  GLUCOSE 114* 173*  BUN 12 18  CREATININE 1.04 1.14  CALCIUM  9.0 8.7*  MG  --  1.9   GFR: Estimated Creatinine Clearance: 95.9 mL/min (by C-G formula based on SCr of 1.14 mg/dL). Liver Function Tests: No results for input(s): AST, ALT, ALKPHOS, BILITOT, PROT, ALBUMIN in the last 168 hours. No results for input(s): LIPASE, AMYLASE in the last 168 hours. No results for input(s):  AMMONIA in the last 168 hours. Coagulation Profile: No results for input(s): INR, PROTIME in the last 168 hours. Cardiac Enzymes: No results for input(s): CKTOTAL, CKMB, CKMBINDEX, TROPONINI in the last 168 hours. BNP (last 3 results) Recent Labs    07/13/24 1835  PROBNP 527.0*   HbA1C: No results for input(s): HGBA1C in the last 72 hours. CBG: No results for input(s): GLUCAP in the last 168 hours. Lipid Profile: No results for input(s): CHOL, HDL, LDLCALC, TRIG, CHOLHDL, LDLDIRECT in the last 72 hours. Thyroid  Function Tests: No results for input(s): TSH, T4TOTAL, FREET4, T3FREE, THYROIDAB in the last 72 hours. Anemia Panel: No results for input(s): VITAMINB12, FOLATE, FERRITIN, TIBC, IRON, RETICCTPCT in the last 72 hours. Urine analysis:    Component Value Date/Time   COLORURINE AMBER (A) 11/25/2017 1618   APPEARANCEUR CLEAR (A) 11/25/2017 1618   LABSPEC 1.028 11/25/2017 1618   PHURINE 5.0 11/25/2017 1618   GLUCOSEU NEGATIVE 11/25/2017 1618   HGBUR SMALL (A) 11/25/2017 1618   BILIRUBINUR negative 10/21/2022 1103   KETONESUR NEGATIVE 11/25/2017 1618   PROTEINUR Positive (A) 10/21/2022 1103   PROTEINUR 30 (A) 11/25/2017 1618   UROBILINOGEN 0.2 10/21/2022 1103   NITRITE negative 10/21/2022 1103   NITRITE NEGATIVE 11/25/2017 1618   LEUKOCYTESUR Moderate (2+) (A) 10/21/2022 1103   Sepsis Labs: @LABRCNTIP (procalcitonin:4,lacticidven:4) ) Recent Results (from the past 240 hours)  Resp panel by RT-PCR (RSV, Flu A&B, Covid) Anterior Nasal Swab     Status: None   Collection Time: 07/13/24  6:47 PM   Specimen: Anterior Nasal Swab  Result Value Ref Range Status   SARS Coronavirus 2 by RT PCR NEGATIVE NEGATIVE Final    Comment: (NOTE) SARS-CoV-2 target nucleic acids are NOT DETECTED.  The SARS-CoV-2 RNA is generally detectable in upper respiratory specimens during the acute phase of infection. The lowest concentration of  SARS-CoV-2 viral copies this assay can detect is 138 copies/mL. A negative result does not preclude SARS-Cov-2 infection and should not be used as the sole basis for treatment or other patient management decisions. A negative result may occur with  improper specimen collection/handling, submission of specimen other than nasopharyngeal swab, presence of viral mutation(s) within the areas targeted by this assay, and inadequate number of viral copies(<138 copies/mL). A negative result must be combined with clinical observations, patient history, and epidemiological information. The  expected result is Negative.  Fact Sheet for Patients:  bloggercourse.com  Fact Sheet for Healthcare Providers:  seriousbroker.it  This test is no t yet approved or cleared by the [place]  FDA and  has been authorized for detection and/or diagnosis of SARS-CoV-2 by FDA under an Emergency Use Authorization (EUA). This EUA will remain  in effect (meaning this test can be used) for the duration of the COVID-19 declaration under Section 564(b)(1) of the Act, 21 U.S.C.section 360bbb-3(b)(1), unless the authorization is terminated  or revoked sooner.       Influenza A by PCR NEGATIVE NEGATIVE Final   Influenza B by PCR NEGATIVE NEGATIVE Final    Comment: (NOTE) The Xpert Xpress SARS-CoV-2/FLU/RSV plus assay is intended as an aid in the diagnosis of influenza from Nasopharyngeal swab specimens and should not be used as a sole basis for treatment. Nasal washings and aspirates are unacceptable for Xpert Xpress SARS-CoV-2/FLU/RSV testing.  Fact Sheet for Patients: bloggercourse.com  Fact Sheet for Healthcare Providers: seriousbroker.it  This test is not yet approved or cleared by the [place]  FDA and has been authorized for detection and/or diagnosis of SARS-CoV-2 by FDA under an Emergency Use  Authorization (EUA). This EUA will remain in effect (meaning this test can be used) for the duration of the COVID-19 declaration under Section 564(b)(1) of the Act, 21 U.S.C. section 360bbb-3(b)(1), unless the authorization is terminated or revoked.     Resp Syncytial Virus by PCR NEGATIVE NEGATIVE Final    Comment: (NOTE) Fact Sheet for Patients: bloggercourse.com  Fact Sheet for Healthcare Providers: seriousbroker.it  This test is not yet approved or cleared by the [place]  FDA and has been authorized for detection and/or diagnosis of SARS-CoV-2 by FDA under an Emergency Use Authorization (EUA). This EUA will remain in effect (meaning this test can be used) for the duration of the COVID-19 declaration under Section 564(b)(1) of the Act, 21 U.S.C. section 360bbb-3(b)(1), unless the authorization is terminated or revoked.  Performed at [facility] [address] [place], [place] [address]   Respiratory (~20 pathogens) panel by PCR     Status: None   Collection Time: 07/13/24 10:12 PM   Specimen: Nasopharyngeal Swab; Respiratory  Result Value Ref Range Status   Adenovirus NOT DETECTED NOT DETECTED Final   Coronavirus 229E NOT DETECTED NOT DETECTED Final    Comment: (NOTE) The Coronavirus on the Respiratory Panel, DOES NOT test for the novel  Coronavirus (2019 nCoV)    Coronavirus HKU1 NOT DETECTED NOT DETECTED Final   Coronavirus NL63 NOT DETECTED NOT DETECTED Final   Coronavirus OC43 NOT DETECTED NOT DETECTED Final   Metapneumovirus NOT DETECTED NOT DETECTED Final   Rhinovirus / Enterovirus NOT DETECTED NOT DETECTED Final   Influenza A NOT DETECTED NOT DETECTED Final   Influenza B NOT DETECTED NOT DETECTED Final   Parainfluenza Virus 1 NOT DETECTED NOT DETECTED Final   Parainfluenza Virus 2 NOT DETECTED NOT DETECTED Final   Parainfluenza Virus 3 NOT DETECTED NOT DETECTED Final   Parainfluenza Virus 4 NOT  DETECTED NOT DETECTED Final   Respiratory Syncytial Virus NOT DETECTED NOT DETECTED Final   Bordetella pertussis NOT DETECTED NOT DETECTED Final   Bordetella Parapertussis NOT DETECTED NOT DETECTED Final   Chlamydophila pneumoniae NOT DETECTED NOT DETECTED Final   Mycoplasma pneumoniae NOT DETECTED NOT DETECTED Final    Comment: Performed at [facility], [address] [place], [place] [address]     Radiological Exams on Admission:  Assessment/Plan Principal Problem:   Acute on chronic respiratory failure with hypoxia (HCC) Active Problems:   COPD with acute exacerbation (HCC)   Acute on chronic diastolic CHF (congestive heart failure) (HCC)   Essential hypertension   Acquired hypothyroidism   Tobacco abuse   Morbid obesity (HCC)   Atrial fibrillation, chronic (HCC)   Assessment and Plan:  Acute on chronic respiratory failure with hypoxia (HCC): Likely due to combination of COPD and CHF exacerbation.  Patient has wheezing on auscultation, indicating COPD exacerbation.  Patient has leg edema, indicating possible CHF exacerbation.  His BNP is 527 which is likely falsely low due to morbid obesity.  Patient has mildly elevated troponin 20 --> 21, no chest pain, likely due to demand ischemia.  -Admitted to PCU as inpatient - Nasal cannula oxygen  to maintain oxygen  saturation above 93% - Bronchodilators - Solu-Medrol  for COPD exacerbation - IV Lasix  for CHF exacerbation.  COPD with acute exacerbation (HCC) -Bronchodilators and prn Mucinex  -Solu-Medrol  80 mg IV daily after given 125 mg of Solu-Medrol  -Incentive spirometry -check RVP -Follow up sputum culture  Acute on chronic diastolic CHF (congestive heart failure) (HCC) -Lasix  40 mg bid by IV (patient received 60 mg of Lasix  in ED) -Daily weights -strict I/O's -Low salt diet -Fluid restriction -As needed bronchodilators for shortness of breath  Essential hypertension -IV hydralazine  as needed - Imdur ,  metoprolol   Acquired hypothyroidism -Synthroid   Tobacco abuse -Nicotine  patch - Did counseling about importance of quitting smoking  Morbid obesity (HCC): Patient has Obesity Class III, with body weight 140 Kg and BMI 45.58 kg/m2.  - Encourage losing weight - Exercise and healthy diet  Chronic a fib: -Continue Eliquis , metoprolol     DVT ppx: on Eliquis   Code Status: Full code   Family Communication:     not done, no family member is at bed side.             Disposition Plan:  Anticipate discharge back to previous environment  Consults called:  none  Admission status and Level of care: Progressive:  as inpt        Dispo: The patient is from: Home              Anticipated d/c is to: Home              Anticipated d/c date is: 2 days              Patient currently is not medically stable to d/c.    Severity of Illness:  The appropriate patient status for this patient is INPATIENT. Inpatient status is judged to be reasonable and necessary in order to provide the required intensity of service to ensure the patient's safety. The patient's presenting symptoms, physical exam findings, and initial radiographic and laboratory data in the context of their chronic comorbidities is felt to place them at high risk for further clinical deterioration. Furthermore, it is not anticipated that the patient will be medically stable for discharge from the hospital within 2 midnights of admission.   * I certify that at the point of admission it is my clinical judgment that the patient will require inpatient hospital care spanning beyond 2 midnights from the point of admission due to high intensity of service, high risk for further deterioration and high frequency of surveillance required.*       Date of Service 07/14/2024    Caleb Exon [facility]   If 7PM-7AM, please contact night-coverage [email or web address] 07/14/2024, 11:52 AM       [  1]  Allergies Allergen Reactions    Lisinopril  Cough   Paroxetine     REACTION: paranoia, confusion   Sertraline Hcl     REACTION: paranoia, confusion   "

## 2024-07-13 NOTE — ED Triage Notes (Signed)
 PT arrived via EMS from home. Per EMS pt wears 2l/[place] baseline at home and has been SOB for the last two days. Pt was found to be at 76% on 2l/[place]. EMS applied pt on 8l/NRB and pt o2 raised up to 95%. Pt has 6l/min via [place] at this time. O2 sat 89-91 percent at this time.

## 2024-07-14 DIAGNOSIS — I480 Paroxysmal atrial fibrillation: Secondary | ICD-10-CM | POA: Diagnosis present

## 2024-07-14 DIAGNOSIS — J449 Chronic obstructive pulmonary disease, unspecified: Secondary | ICD-10-CM | POA: Insufficient documentation

## 2024-07-14 DIAGNOSIS — I482 Chronic atrial fibrillation, unspecified: Secondary | ICD-10-CM | POA: Diagnosis present

## 2024-07-14 DIAGNOSIS — J441 Chronic obstructive pulmonary disease with (acute) exacerbation: Secondary | ICD-10-CM | POA: Diagnosis present

## 2024-07-14 LAB — CBC
HCT: 44.5 % (ref 39.0–52.0)
Hemoglobin: 14.3 g/dL (ref 13.0–17.0)
MCH: 30 pg (ref 26.0–34.0)
MCHC: 32.1 g/dL (ref 30.0–36.0)
MCV: 93.5 fL (ref 80.0–100.0)
Platelets: 198 K/uL (ref 150–400)
RBC: 4.76 MIL/uL (ref 4.22–5.81)
RDW: 12.5 % (ref 11.5–15.5)
WBC: 9.5 K/uL (ref 4.0–10.5)
nRBC: 0 % (ref 0.0–0.2)

## 2024-07-14 LAB — RESPIRATORY PANEL BY PCR

## 2024-07-14 LAB — BASIC METABOLIC PANEL WITH GFR
Anion gap: 8 (ref 5–15)
BUN: 18 mg/dL (ref 6–20)
CO2: 39 mmol/L — ABNORMAL HIGH (ref 22–32)
Calcium: 8.7 mg/dL — ABNORMAL LOW (ref 8.9–10.3)
Chloride: 90 mmol/L — ABNORMAL LOW (ref 98–111)
Creatinine, Ser: 1.14 mg/dL (ref 0.61–1.24)
GFR, Estimated: >60 mL/min
Glucose, Bld: 173 mg/dL — ABNORMAL HIGH (ref 70–99)
Potassium: 4.8 mmol/L (ref 3.5–5.1)
Sodium: 137 mmol/L (ref 135–145)

## 2024-07-14 LAB — HIV ANTIBODY (ROUTINE TESTING W REFLEX): HIV Screen 4th Generation wRfx: NONREACTIVE

## 2024-07-14 LAB — TROPONIN T, HIGH SENSITIVITY: Troponin T High Sensitivity: 16 ng/L (ref 0–19)

## 2024-07-14 LAB — MAGNESIUM: Magnesium: 1.9 mg/dL (ref 1.7–2.4)

## 2024-07-14 MED ORDER — METHYLPREDNISOLONE SODIUM SUCC 125 MG IJ SOLR
80.0000 mg | Freq: Every day | INTRAMUSCULAR | Status: DC
  Administered 2024-07-14 – 2024-07-19 (×6): 80 mg via INTRAVENOUS
  Filled 2024-07-14 (×6): qty 2

## 2024-07-14 MED ORDER — FUROSEMIDE 10 MG/ML IJ SOLN
40.0000 mg | Freq: Two times a day (BID) | INTRAMUSCULAR | Status: DC
  Administered 2024-07-14 – 2024-07-15 (×4): 40 mg via INTRAVENOUS
  Filled 2024-07-14 (×4): qty 4

## 2024-07-14 MED ORDER — ACETAMINOPHEN 325 MG PO TABS
650.0000 mg | ORAL_TABLET | Freq: Four times a day (QID) | ORAL | Status: DC | PRN
  Administered 2024-07-14 – 2024-07-25 (×13): 650 mg via ORAL
  Filled 2024-07-14 (×14): qty 2

## 2024-07-14 NOTE — Progress Notes (Signed)
"  ° °      Overnight   NAME: Ryan Hatfield MRN: [id number] DOB : [birth year]    Date of Service   07/14/2024   HPI/Events of Note   HPI: 59 year old male with medical history significant for decompensated heart failure, hypertension, hyperlipidemia, COPD on 2 L oxygen  at baseline, hypothyroidism, GERD, depression with anxiety, A-fib on Eliquis  morbid obesity, smoker, alcohol abuse in remission, medical noncompliance who presented with SOB.  Patient states that he is not taking most of his medications including Eliquis  for a while due to insurance issue.  He developed shortness of breath in the past 2 days which has been progressively worsening.  He has a cough with clear mucus production no chest pain fever or chills no nausea vomiting diarrhea or abdominal pain no symptoms of UTI.  Patient normally using 2 L of oxygen  at baseline but was found to have oxygen  desaturations to 76% at home.  Initially started on 6 to 8 L of oxygen  but was still in respiratory distress with difficulty speaking in full sentences he was then started on BiPAP with improvement BiPAP has been weaned off and patient was currently on 6 L of oxygen  with 100% SPO2.  Of note patient has a BNP of 527, GFR less than 60 troponin was 20 now 21.    Overnight: RN notified me patient became tachycardic with heart rates in the 130s O2 saturations 90 on high flow 15 L.  EKG ordered, positive for A-fib.  Placed patient back on BiPAP with improvement of heart rate into the 70s.  Physical Exam Vitals reviewed.  Constitutional:      General: He is not in acute distress. Eyes:     Pupils: Pupils are equal, round, and reactive to light.  Cardiovascular:     Rate and Rhythm: Normal rate. Rhythm irregular.     Pulses:          Radial pulses are 2+ on the right side and 2+ on the left side.     Heart sounds: S1 normal and S2 normal.  Pulmonary:     Breath sounds: Examination of the right-upper field reveals decreased breath sounds.  Examination of the left-upper field reveals decreased breath sounds. Examination of the right-middle field reveals decreased breath sounds. Examination of the left-middle field reveals decreased breath sounds. Examination of the right-lower field reveals decreased breath sounds. Examination of the left-lower field reveals decreased breath sounds. Decreased breath sounds present.  Abdominal:     General: Bowel sounds are normal. There is distension.  Skin:    General: Skin is warm and dry.     Capillary Refill: Capillary refill takes 2 to 3 seconds.  Neurological:     Mental Status: He is alert and oriented to person, place, and time.     GCS: GCS eye subscore is 4. GCS verbal subscore is 5. GCS motor subscore is 6.  Psychiatric:        Mood and Affect: Mood normal.       Interventions/ Plan   EKG Remain on BiPAP at this time due to cardiac instability. Repeat troponin      Laneta Gardener- Garmon BSN RN CCRN AGACNP-BC Acute Care Nurse Practitioner [facility] [place]  "

## 2024-07-14 NOTE — ED Notes (Signed)
 0300 patient found to have HR 130's  O2 sat 90 on high flow O2 15L placed back on bipap 60% FIO2 and VSS  back to NSR 70's

## 2024-07-14 NOTE — ED Notes (Signed)
 This RN reached out to respiratory about pt's plan of care. Pt is currently on high flow [place] with O2 levels between 88%-94%. Per respiratory they are okay with anything above 88%. Pt does have a hx of COPD and CHF. Pt is in no discomfort and sleeping. Respiratory told this RN if pt begins to desat I may place him back on bipap. Bipap machine at bedside on standby.

## 2024-07-14 NOTE — Progress Notes (Signed)
 " PROGRESS NOTE    Ryan Hatfield   FMW:[id number] DOB: [birth year]  DOA: 07/13/2024 Date of Service: 07/14/2024 which is hospital day 1  PCP: Corwin Antu, [facility] course / significant events:   Ryan Hatfield is a 59 y.o. male with medical history significant of dCHF, HTN, HLD, COPD on 2L oxygen , hypothyroidism, GERD, depression with anxiety, A-fib on Eliquis , morbid obesity, smoker, alcohol abuse in remission, medication noncompliance, who presents with SOB.   HPI: Patient states that he is not taking most of his medications including Eliquis  for a while due to insurance issue. He developed SOB in the past 2 days, which has been progressively worsening. He has cough with clear mucus production, no chest pain, fever or chills. Patient is normally using 2 L oxygen  at baseline, but was found to have oxygen  desaturation to 76% on home level 2 L oxygen , initially started 6-8 L oxygen , still has respiratory distress and difficulty speaking in full sentence, then started BiPAP with improvement.  BiPAP is weaned off now, currently on 6 L oxygen  with 100% of saturation.   01/07:  to ED. BNP 527, GFR> 60, troponin 20 --> 21, negative PCR for COVID, flu and RSV. Temperature normal, blood pressure 158/62, heart rate 101 --> 84, RR 21.  Chest x-ray showed cardiomegaly and bibasilar atelectasis.  Patient is admitted to PCU as inpatient. Needing HFNC up to 15 L/min 01/08: BiPAP overnight w/ improvement in Afib rate. Off BiPAP today per patient request, remains on high flow [place]. Continue diuresis     Consultants:  none  Procedures/Surgeries: none      ASSESSMENT & PLAN:   Acute on chronic respiratory failure with hypoxia Likely due to combination of COPD and CHF exacerbation.   O2 titrate as needed / wean as able Treat underlying cause(s) as noted  COPD with acute exacerbation  Wheezing on exam  Bronchodilators and prn Mucinex  Solu-Medrol  80 mg IV daily after given 125 mg of  Solu-Medrol  Incentive spirometry check RVP Follow up sputum culture   Acute on chronic diastolic CHF (congestive heart failure)  Patient has leg edema, indicating possible CHF exacerbation.  His BNP is 527 which is likely falsely low due to morbid obesity. Lasix  40 mg bid by IV (patient received 60 mg of Lasix  in ED) Daily weights strict I/O's Low salt diet Fluid restriction   Afib Eliquis  Metoprolol  XR 150 mg daily  Telemetry  Elevation troponin Likely demand ischemia Repeat as needed EKG as needed chest pain   Essential hypertension IV hydralazine  as needed Imdur , metoprolol    Acquired hypothyroidism Synthroid    Tobacco abuse Nicotine  patch Did counseling about importance of quitting smoking     Class 3 obesity based on BMI: Body mass index is 45.58 kg/m.SABRA Significantly low or high BMI is associated with higher medical risk.  Underweight - under 18  overweight - 25 to 29 obese - 30 or more Class 1 obesity: BMI of 30.0 to 34 Class 2 obesity: BMI of 35.0 to 39 Class 3 obesity: BMI of 40.0 to 49 Super Morbid Obesity: BMI 50-59 Super-super Morbid Obesity: BMI 60+ Healthy nutrition and physical activity advised as adjunct to other disease management and risk reduction treatments    DVT prophylaxis: eliquis  IV fluids: no continuous IV fluids  Nutrition: cardaic Central lines / other devices: none  Code Status: FULL CODE ACP documentation reviewed:  none on file in VYNCA  TOC needs: TBD Medical barriers to dispo: O2 requirement - HFNC  on progressive unit. Expected medical readiness for discharge several days.              Subjective / Brief ROS:  Patient reports feeling better than yesterday but still SOB Denies CP Pain controlled.  Denies new weakness.  Tolerating diet.  Reports no concerns w/ urination/defecation.   Family Communication: none at this time    Objective Findings:  Vitals:   07/14/24 0500 07/14/24 0624 07/14/24 0725  07/14/24 1112  BP: 112/70 (!) 115/46  (!) 129/49  Pulse: 60 60  77  Resp: 19 (!) 22  (!) 21  Temp:  98.2 F (36.8 C)  98.6 F (37 C)  TempSrc:  Oral  Oral  SpO2: 94% 93% 98% 91%  Weight:      Height:        Intake/Output Summary (Last 24 hours) at 07/14/2024 1617 Last data filed at 07/14/2024 1111 Gross per 24 hour  Intake 454 ml  Output 2090 ml  Net -1636 ml   Filed Weights   07/13/24 1834 07/13/24 1904  Weight: (!) 137 kg (!) 140 kg    Examination:  Physical Exam Constitutional:      General: He is not in acute distress.    Appearance: He is obese.  Cardiovascular:     Rate and Rhythm: Normal rate and regular rhythm.  Pulmonary:     Breath sounds: Decreased breath sounds and wheezing present.  Musculoskeletal:     Right lower leg: Edema present.     Left lower leg: Edema present.  Neurological:     Mental Status: He is alert and oriented to person, place, and time.  Psychiatric:        Mood and Affect: Mood normal.        Behavior: Behavior normal.          Scheduled Medications:   apixaban   5 mg Oral BID   folic acid   1 mg Oral Daily   furosemide   40 mg Intravenous Q12H   ipratropium-albuterol   3 mL Nebulization Q4H   isosorbide  mononitrate  30 mg Oral Daily   levothyroxine   50 mcg Oral Daily   methylPREDNISolone  (SOLU-MEDROL ) injection  80 mg Intravenous Daily   metoprolol  succinate  150 mg Oral Daily   nicotine   21 mg Transdermal Daily   pantoprazole   40 mg Oral Daily    Continuous Infusions:   PRN Medications:  albuterol , dextromethorphan-guaiFENesin , nitroGLYCERIN   Antimicrobials from admission:  Anti-infectives (From admission, onward)    None           Data Reviewed:  I have personally reviewed the following...  CBC: Recent Labs  Lab 07/13/24 1835 07/14/24 0448  WBC 10.4 9.5  NEUTROABS 8.9*  --   HGB 14.3 14.3  HCT 44.5 44.5  MCV 92.9 93.5  PLT 198 198   Basic Metabolic Panel: Recent Labs  Lab 07/13/24 1835  07/14/24 0448  NA 138 137  K 4.4 4.8  CL 91* 90*  CO2 37* 39*  GLUCOSE 114* 173*  BUN 12 18  CREATININE 1.04 1.14  CALCIUM  9.0 8.7*  MG  --  1.9   GFR: Estimated Creatinine Clearance: 95.9 mL/min (by C-G formula based on SCr of 1.14 mg/dL). Liver Function Tests: No results for input(s): AST, ALT, ALKPHOS, BILITOT, PROT, ALBUMIN in the last 168 hours. No results for input(s): LIPASE, AMYLASE in the last 168 hours. No results for input(s): AMMONIA in the last 168 hours. Coagulation Profile: No results for input(s): INR, PROTIME in  the last 168 hours. Cardiac Enzymes: No results for input(s): CKTOTAL, CKMB, CKMBINDEX, TROPONINI in the last 168 hours. BNP (last 3 results) Recent Labs    07/13/24 1835  PROBNP 527.0*   HbA1C: No results for input(s): HGBA1C in the last 72 hours. CBG: No results for input(s): GLUCAP in the last 168 hours. Lipid Profile: No results for input(s): CHOL, HDL, LDLCALC, TRIG, CHOLHDL, LDLDIRECT in the last 72 hours. Thyroid  Function Tests: No results for input(s): TSH, T4TOTAL, FREET4, T3FREE, THYROIDAB in the last 72 hours. Anemia Panel: No results for input(s): VITAMINB12, FOLATE, FERRITIN, TIBC, IRON, RETICCTPCT in the last 72 hours. Most Recent Urinalysis On File:     Component Value Date/Time   COLORURINE AMBER (A) 11/25/2017 1618   APPEARANCEUR CLEAR (A) 11/25/2017 1618   LABSPEC 1.028 11/25/2017 1618   PHURINE 5.0 11/25/2017 1618   GLUCOSEU NEGATIVE 11/25/2017 1618   HGBUR SMALL (A) 11/25/2017 1618   BILIRUBINUR negative 10/21/2022 1103   KETONESUR NEGATIVE 11/25/2017 1618   PROTEINUR Positive (A) 10/21/2022 1103   PROTEINUR 30 (A) 11/25/2017 1618   UROBILINOGEN 0.2 10/21/2022 1103   NITRITE negative 10/21/2022 1103   NITRITE NEGATIVE 11/25/2017 1618   LEUKOCYTESUR Moderate (2+) (A) 10/21/2022 1103   Sepsis  Labs: @LABRCNTIP (procalcitonin:4,lacticidven:4) Microbiology: Recent Results (from the past 240 hours)  Resp panel by RT-PCR (RSV, Flu A&B, Covid) Anterior Nasal Swab     Status: None   Collection Time: 07/13/24  6:47 PM   Specimen: Anterior Nasal Swab  Result Value Ref Range Status   SARS Coronavirus 2 by RT PCR NEGATIVE NEGATIVE Final    Comment: (NOTE) SARS-CoV-2 target nucleic acids are NOT DETECTED.  The SARS-CoV-2 RNA is generally detectable in upper respiratory specimens during the acute phase of infection. The lowest concentration of SARS-CoV-2 viral copies this assay can detect is 138 copies/mL. A negative result does not preclude SARS-Cov-2 infection and should not be used as the sole basis for treatment or other patient management decisions. A negative result may occur with  improper specimen collection/handling, submission of specimen other than nasopharyngeal swab, presence of viral mutation(s) within the areas targeted by this assay, and inadequate number of viral copies(<138 copies/mL). A negative result must be combined with clinical observations, patient history, and epidemiological information. The expected result is Negative.  Fact Sheet for Patients:  bloggercourse.com  Fact Sheet for Healthcare Providers:  seriousbroker.it  This test is no t yet approved or cleared by the [place]  FDA and  has been authorized for detection and/or diagnosis of SARS-CoV-2 by FDA under an Emergency Use Authorization (EUA). This EUA will remain  in effect (meaning this test can be used) for the duration of the COVID-19 declaration under Section 564(b)(1) of the Act, 21 U.S.C.section 360bbb-3(b)(1), unless the authorization is terminated  or revoked sooner.       Influenza A by PCR NEGATIVE NEGATIVE Final   Influenza B by PCR NEGATIVE NEGATIVE Final    Comment: (NOTE) The Xpert Xpress SARS-CoV-2/FLU/RSV plus assay is  intended as an aid in the diagnosis of influenza from Nasopharyngeal swab specimens and should not be used as a sole basis for treatment. Nasal washings and aspirates are unacceptable for Xpert Xpress SARS-CoV-2/FLU/RSV testing.  Fact Sheet for Patients: bloggercourse.com  Fact Sheet for Healthcare Providers: seriousbroker.it  This test is not yet approved or cleared by the [place]  FDA and has been authorized for detection and/or diagnosis of SARS-CoV-2 by FDA under an Emergency Use Authorization (EUA). This EUA will  remain in effect (meaning this test can be used) for the duration of the COVID-19 declaration under Section 564(b)(1) of the Act, 21 U.S.C. section 360bbb-3(b)(1), unless the authorization is terminated or revoked.     Resp Syncytial Virus by PCR NEGATIVE NEGATIVE Final    Comment: (NOTE) Fact Sheet for Patients: bloggercourse.com  Fact Sheet for Healthcare Providers: seriousbroker.it  This test is not yet approved or cleared by the [place]  FDA and has been authorized for detection and/or diagnosis of SARS-CoV-2 by FDA under an Emergency Use Authorization (EUA). This EUA will remain in effect (meaning this test can be used) for the duration of the COVID-19 declaration under Section 564(b)(1) of the Act, 21 U.S.C. section 360bbb-3(b)(1), unless the authorization is terminated or revoked.  Performed at [facility], [address] [place], [place] [address]   Respiratory (~20 pathogens) panel by PCR     Status: None   Collection Time: 07/13/24 10:12 PM   Specimen: Nasopharyngeal Swab; Respiratory  Result Value Ref Range Status   Adenovirus NOT DETECTED NOT DETECTED Final   Coronavirus 229E NOT DETECTED NOT DETECTED Final    Comment: (NOTE) The Coronavirus on the Respiratory Panel, DOES NOT test for the novel  Coronavirus (2019 nCoV)     Coronavirus HKU1 NOT DETECTED NOT DETECTED Final   Coronavirus NL63 NOT DETECTED NOT DETECTED Final   Coronavirus OC43 NOT DETECTED NOT DETECTED Final   Metapneumovirus NOT DETECTED NOT DETECTED Final   Rhinovirus / Enterovirus NOT DETECTED NOT DETECTED Final   Influenza A NOT DETECTED NOT DETECTED Final   Influenza B NOT DETECTED NOT DETECTED Final   Parainfluenza Virus 1 NOT DETECTED NOT DETECTED Final   Parainfluenza Virus 2 NOT DETECTED NOT DETECTED Final   Parainfluenza Virus 3 NOT DETECTED NOT DETECTED Final   Parainfluenza Virus 4 NOT DETECTED NOT DETECTED Final   Respiratory Syncytial Virus NOT DETECTED NOT DETECTED Final   Bordetella pertussis NOT DETECTED NOT DETECTED Final   Bordetella Parapertussis NOT DETECTED NOT DETECTED Final   Chlamydophila pneumoniae NOT DETECTED NOT DETECTED Final   Mycoplasma pneumoniae NOT DETECTED NOT DETECTED Final    Comment: Performed at [facility], [address] [place], [place] [address]      Radiology Studies last 3 days: DG Chest Portable 1 View Result Date: 07/13/2024 EXAM: 1 VIEW(S) XRAY OF THE CHEST 02/24/2023 06:54:20 PM COMPARISON: None available. CLINICAL HISTORY: sob FINDINGS: LUNGS AND PLEURA: Bibasilar atelectasis. No focal pulmonary opacity. No pleural effusion. No pneumothorax. HEART AND MEDIASTINUM: Heart is mildly enlarged. BONES AND SOFT TISSUES: No acute osseous abnormality. IMPRESSION: 1. Mild cardiomegaly. 2. Bibasilar atelectasis. Electronically signed by: Greig Pique MD MD 07/13/2024 06:56 PM EST RP Workstation: [id number]         Laneta Blunt, DO [facility] 07/14/2024, 4:17 PM    Dictation software may have been used to generate the above note. Typos may occur and escape review in typed/dictated notes. Please contact Dr Blunt directly for clarity if needed.  Staff may message me via secure chat in Epic  but this may not receive an immediate response,  please page me for urgent  matters!  If 7PM-7AM, please contact night coverage [email or web address]       "

## 2024-07-14 NOTE — Hospital Course (Addendum)
 Hospital course / significant events:   Ryan Hatfield is a 59 y.o. male with medical history significant of dCHF, HTN, HLD, COPD on 2L oxygen , hypothyroidism, GERD, depression with anxiety, A-fib on Eliquis , morbid obesity, smoker, alcohol abuse in remission, medication noncompliance, who presents with SOB.   HPI: Patient states that he is not taking most of his medications including Eliquis  for a while due to insurance issue. (He is not able to recall some medications he's not been taking, reports not on inhalers recently, not sure about all cradiac meds but reports he's been on his eliquis ) He developed SOB in the past 2 days, which has been progressively worsening. He has cough with clear mucus production, no chest pain, fever or chills. Patient is normally using 2 L oxygen  at baseline, but was found to have oxygen  desaturation to 76% on home level 2 L oxygen , initially started 6-8 L oxygen , still has respiratory distress and difficulty speaking in full sentence, then started BiPAP with improvement.  BiPAP is weaned off now, currently on 6 L oxygen  with 100% of saturation.   01/07:  to ED. BNP 527, GFR> 60, troponin 20 --> 21, negative PCR for COVID, flu and RSV. Temperature normal, blood pressure 158/62, heart rate 101 --> 84, RR 21.  Chest x-ray showed cardiomegaly and bibasilar atelectasis.  Patient is admitted to PCU as inpatient. Needing HFNC up to 15 L/min 01/08: BiPAP overnight w/ improvement in Afib rate. Off BiPAP today per patient request, remains on high flow [place] 15 L/min. Continue diuresis. UOP yesterday -1400 mL 01/09: HFNC 11 L/min, no BiPAP overnight. UOP yesterday -1500 mL. Back into Afib RVR, dilt bolus ineffective, starting drip, Echo ordered. D/w cardiology, plan to see him tomorrow.      Consultants:  Cardiology   Procedures/Surgeries: none      ASSESSMENT & PLAN:   Acute on chronic respiratory failure with hypoxia Likely due to combination of COPD and CHF  exacerbation.   O2 titrate as needed / wean as able - back on BiPAP this morning  Treat underlying cause(s) as noted  COPD with acute exacerbation  Wheezing on exam  Bronchodilators and prn Mucinex  Solu-Medrol  80 mg IV daily after given 125 mg of Solu-Medrol  Incentive spirometry check RVP Follow up sputum culture   Acute on chronic diastolic CHF (congestive heart failure)  Patient has leg edema, indicating possible CHF exacerbation.  His BNP is 527 which is likely falsely low due to morbid obesity. Last echo 2024 - EF 60-65%, grade 1 diast df  Lasix  40 mg bid by IV (patient received 60 mg of Lasix  in ED) Daily weights strict I/O's Low salt diet Fluid restriction   Hypochloremia / Metabolic Alkalosis  Likely mixed respiratory component w/ hypoxia/hyperventilation but CO2 retention w/ habitus/OSA, and would expect some Cl wasting w/ lasix  Monitor BMP  reduce diuresis if worsening contraction alkalosis but would avoid giving IV fluids   Afib RVR Eliquis  - pt reports compliance at home  Back on BiPAP to reduce WOB - off again this afternoon, resume prn  Diltiazem  drip initiated today If needed ok to add amiodarone  bolus/drip if diltiazem  is not improving HR  Metoprolol  XR 150 mg daily  Telemetry consult cardiology given recurrence of RVR / difficulty of rate control   Elevation troponin Likely demand ischemia Repeat as needed EKG as needed chest pain   Essential hypertension IV hydralazine  as needed Imdur , metoprolol    Acquired hypothyroidism Synthroid    Tobacco abuse Nicotine  patch Did counseling about  importance of quitting smoking     Class 3 obesity based on BMI: Body mass index is 45.58 kg/m.SABRA Significantly low or high BMI is associated with higher medical risk.  Underweight - under 18  overweight - 25 to 29 obese - 30 or more Class 1 obesity: BMI of 30.0 to 34 Class 2 obesity: BMI of 35.0 to 39 Class 3 obesity: BMI of 40.0 to 49 Super Morbid Obesity: BMI  50-59 Super-super Morbid Obesity: BMI 60+ Healthy nutrition and physical activity advised as adjunct to other disease management and risk reduction treatments    DVT prophylaxis: eliquis  IV fluids: no continuous IV fluids  Nutrition: cardaic Central lines / other devices: none  Code Status: FULL CODE ACP documentation reviewed:  none on file in VYNCA  TOC needs: TBD Medical barriers to dispo: O2 requirement - HFNC on progressive unit. Afib RVR Expected medical readiness for discharge several days.

## 2024-07-15 ENCOUNTER — Inpatient Hospital Stay: Admit: 2024-07-15

## 2024-07-15 ENCOUNTER — Inpatient Hospital Stay

## 2024-07-15 DIAGNOSIS — I5033 Acute on chronic diastolic (congestive) heart failure: Secondary | ICD-10-CM | POA: Diagnosis not present

## 2024-07-15 DIAGNOSIS — I482 Chronic atrial fibrillation, unspecified: Secondary | ICD-10-CM | POA: Diagnosis not present

## 2024-07-15 DIAGNOSIS — J9621 Acute and chronic respiratory failure with hypoxia: Secondary | ICD-10-CM | POA: Diagnosis not present

## 2024-07-15 LAB — BASIC METABOLIC PANEL WITH GFR
Anion gap: 10 (ref 5–15)
BUN: 36 mg/dL — ABNORMAL HIGH (ref 6–20)
CO2: 38 mmol/L — ABNORMAL HIGH (ref 22–32)
Calcium: 8.6 mg/dL — ABNORMAL LOW (ref 8.9–10.3)
Chloride: 89 mmol/L — ABNORMAL LOW (ref 98–111)
Creatinine, Ser: 1.2 mg/dL (ref 0.61–1.24)
GFR, Estimated: >60 mL/min
Glucose, Bld: 113 mg/dL — ABNORMAL HIGH (ref 70–99)
Potassium: 4.1 mmol/L (ref 3.5–5.1)
Sodium: 137 mmol/L (ref 135–145)

## 2024-07-15 LAB — TROPONIN T, HIGH SENSITIVITY: Troponin T High Sensitivity: 22 ng/L — ABNORMAL HIGH (ref 0–19)

## 2024-07-15 MED ORDER — AMIODARONE HCL IN DEXTROSE 360-4.14 MG/200ML-% IV SOLN
60.0000 mg/h | INTRAVENOUS | Status: DC
  Administered 2024-07-15 (×2): 60 mg/h via INTRAVENOUS
  Filled 2024-07-15 (×2): qty 200

## 2024-07-15 MED ORDER — AMIODARONE LOAD VIA INFUSION
150.0000 mg | Freq: Once | INTRAVENOUS | Status: AC
  Administered 2024-07-15: 150 mg via INTRAVENOUS
  Filled 2024-07-15: qty 83.34

## 2024-07-15 MED ORDER — AMIODARONE HCL IN DEXTROSE 360-4.14 MG/200ML-% IV SOLN: 60.0000 mg/h | INTRAVENOUS | Status: DC

## 2024-07-15 MED ORDER — AMIODARONE HCL IN DEXTROSE 360-4.14 MG/200ML-% IV SOLN: 30.0000 mg/h | INTRAVENOUS | Status: DC

## 2024-07-15 MED ORDER — AMIODARONE LOAD VIA INFUSION
150.0000 mg | Freq: Once | INTRAVENOUS | Status: DC
  Filled 2024-07-15: qty 83.34

## 2024-07-15 MED ORDER — DILTIAZEM HCL-DEXTROSE 125-5 MG/125ML-% IV SOLN (PREMIX)
5.0000 mg/h | INTRAVENOUS | Status: DC
  Administered 2024-07-15: 15 mg/h via INTRAVENOUS
  Administered 2024-07-15: 5 mg/h via INTRAVENOUS
  Administered 2024-07-16: 15 mg/h via INTRAVENOUS
  Filled 2024-07-15 (×3): qty 125

## 2024-07-15 MED ORDER — DILTIAZEM HCL 25 MG/5ML IV SOLN
15.0000 mg | Freq: Once | INTRAVENOUS | Status: AC
  Administered 2024-07-15: 15 mg via INTRAVENOUS
  Filled 2024-07-15: qty 5

## 2024-07-15 MED ORDER — DILTIAZEM LOAD VIA INFUSION
15.0000 mg | Freq: Once | INTRAVENOUS | Status: AC
  Administered 2024-07-15: 15 mg via INTRAVENOUS
  Filled 2024-07-15: qty 15

## 2024-07-15 MED ORDER — AMIODARONE HCL IN DEXTROSE 360-4.14 MG/200ML-% IV SOLN
30.0000 mg/h | INTRAVENOUS | Status: DC
  Administered 2024-07-16: 30 mg/h via INTRAVENOUS
  Filled 2024-07-15: qty 200

## 2024-07-15 NOTE — Progress Notes (Addendum)
 " PROGRESS NOTE    Ryan Hatfield   FMW:[id number] DOB: 05/19/1966  DOA: 07/13/2024 Date of Service: 07/15/2024 which is hospital day 2  PCP: Corwin Antu, [facility] course / significant events:   Ryan Hatfield is a 59 y.o. male with medical history significant of dCHF, HTN, HLD, COPD on 2L oxygen , hypothyroidism, GERD, depression with anxiety, A-fib on Eliquis , morbid obesity, smoker, alcohol abuse in remission, medication noncompliance, who presents with SOB.   HPI: Patient states that he is not taking most of his medications including Eliquis  for a while due to insurance issue. (He is not able to recall some medications he's not been taking, reports not on inhalers recently, not sure about all cradiac meds but reports he's been on his eliquis ) He developed SOB in the past 2 days, which has been progressively worsening. He has cough with clear mucus production, no chest pain, fever or chills. Patient is normally using 2 L oxygen  at baseline, but was found to have oxygen  desaturation to 76% on home level 2 L oxygen , initially started 6-8 L oxygen , still has respiratory distress and difficulty speaking in full sentence, then started BiPAP with improvement.  BiPAP is weaned off now, currently on 6 L oxygen  with 100% of saturation.   01/07:  to ED. BNP 527, GFR> 60, troponin 20 --> 21, negative PCR for COVID, flu and RSV. Temperature normal, blood pressure 158/62, heart rate 101 --> 84, RR 21.  Chest x-ray showed cardiomegaly and bibasilar atelectasis.  Patient is admitted to PCU as inpatient. Needing HFNC up to 15 L/min 01/08: BiPAP overnight w/ improvement in Afib rate. Off BiPAP today per patient request, remains on high flow Stout 15 L/min. Continue diuresis. UOP yesterday -1400 mL 01/09: HFNC 11 L/min, no BiPAP overnight. UOP yesterday -1500 mL. Back into Afib RVR, dilt bolus ineffective, starting drip, Echo ordered. D/w cardiology, plan to see him tomorrow.      Consultants:   Cardiology   Procedures/Surgeries: none      ASSESSMENT & PLAN:   Acute on chronic respiratory failure with hypoxia Likely due to combination of COPD and CHF exacerbation.   O2 titrate as needed / wean as able - back on BiPAP this morning  Treat underlying cause(s) as noted  COPD with acute exacerbation  Wheezing on exam  Bronchodilators and prn Mucinex  Solu-Medrol  80 mg IV daily after given 125 mg of Solu-Medrol  Incentive spirometry check RVP Follow up sputum culture   Acute on chronic diastolic CHF (congestive heart failure)  Patient has leg edema, indicating possible CHF exacerbation.  His BNP is 527 which is likely falsely low due to morbid obesity. Last echo 2024 - EF 60-65%, grade 1 diast df  Lasix  40 mg bid by IV (patient received 60 mg of Lasix  in ED) Daily weights strict I/O's Low salt diet Fluid restriction   Hypochloremia / Metabolic Alkalosis  Likely mixed respiratory component w/ hypoxia/hyperventilation but CO2 retention w/ habitus/OSA, and would expect some Cl wasting w/ lasix  Monitor BMP  reduce diuresis if worsening contraction alkalosis but would avoid giving IV fluids   Afib RVR Eliquis  - pt reports compliance at home  Back on BiPAP to reduce WOB - off again this afternoon, resume prn  Diltiazem  drip initiated today If needed ok to add amiodarone  bolus/drip if diltiazem  is not improving HR  Metoprolol  XR 150 mg daily  Telemetry consult cardiology given recurrence of RVR / difficulty of rate control   Elevation troponin Likely  demand ischemia Repeat as needed EKG as needed chest pain   Essential hypertension IV hydralazine  as needed Imdur , metoprolol    Acquired hypothyroidism Synthroid    Tobacco abuse Nicotine  patch Did counseling about importance of quitting smoking     Class 3 obesity based on BMI: Body mass index is 45.58 kg/m.SABRA Significantly low or high BMI is associated with higher medical risk.  Underweight - under 18   overweight - 25 to 29 obese - 30 or more Class 1 obesity: BMI of 30.0 to 34 Class 2 obesity: BMI of 35.0 to 39 Class 3 obesity: BMI of 40.0 to 49 Super Morbid Obesity: BMI 50-59 Super-super Morbid Obesity: BMI 60+ Healthy nutrition and physical activity advised as adjunct to other disease management and risk reduction treatments    DVT prophylaxis: eliquis  IV fluids: no continuous IV fluids  Nutrition: cardaic Central lines / other devices: none  Code Status: FULL CODE ACP documentation reviewed:  none on file in [facility]  TOC needs: TBD Medical barriers to dispo: O2 requirement - HFNC on progressive unit. Afib RVR Expected medical readiness for discharge several days.              Subjective / Brief ROS:  Patient reports SOB BiPAP is helping Denies CP / palpitations  Pain controlled.  Denies new weakness.  Tolerating diet.  Reports no concerns w/ urination/defecation.   Family Communication: none at this time    Objective Findings:  Vitals:   07/15/24 1430 07/15/24 1530 07/15/24 1600 07/15/24 1609  BP: (!) 172/147 137/88 (!) 146/76   Pulse: (!) 162 (!) 132 (!) 126   Resp: 18 (!) 22 19   Temp:    98.4 F (36.9 C)  TempSrc:      SpO2: 94% 95% 97%   Weight:      Height:        Intake/Output Summary (Last 24 hours) at 07/15/2024 1630 Last data filed at 07/15/2024 0600 Gross per 24 hour  Intake 180 ml  Output 1400 ml  Net -1220 ml   Filed Weights   07/13/24 1834 07/13/24 1904  Weight: (!) 137 kg (!) 140 kg    Examination:  Physical Exam Constitutional:      General: He is not in acute distress.    Appearance: He is obese.  Cardiovascular:     Rate and Rhythm: Tachycardia present. Rhythm irregular.  Pulmonary:     Breath sounds: Decreased breath sounds and wheezing present. No rales (minimal/atelectasis).  Musculoskeletal:     Right lower leg: Edema (trace) present.     Left lower leg: Edema (trace) present.  Neurological:     Mental Status:  He is alert and oriented to person, place, and time.  Psychiatric:        Mood and Affect: Mood normal.        Behavior: Behavior normal.          Scheduled Medications:   apixaban   5 mg Oral BID   folic acid   1 mg Oral Daily   furosemide   40 mg Intravenous Q12H   ipratropium-albuterol   3 mL Nebulization Q4H   isosorbide  mononitrate  30 mg Oral Daily   levothyroxine   50 mcg Oral Daily   methylPREDNISolone  (SOLU-MEDROL ) injection  80 mg Intravenous Daily   metoprolol  succinate  150 mg Oral Daily   nicotine   21 mg Transdermal Daily   pantoprazole   40 mg Oral Daily    Continuous Infusions:  diltiazem  (CARDIZEM ) infusion 15 mg/hr (07/15/24 1627)  PRN Medications:  acetaminophen , albuterol , dextromethorphan-guaiFENesin , nitroGLYCERIN   Antimicrobials from admission:  Anti-infectives (From admission, onward)    None           Data Reviewed:  I have personally reviewed the following...  CBC: Recent Labs  Lab 07/13/24 1835 07/14/24 0448  WBC 10.4 9.5  NEUTROABS 8.9*  --   HGB 14.3 14.3  HCT 44.5 44.5  MCV 92.9 93.5  PLT 198 198   Basic Metabolic Panel: Recent Labs  Lab 07/13/24 1835 07/14/24 0448 07/15/24 0439  NA 138 137 137  K 4.4 4.8 4.1  CL 91* 90* 89*  CO2 37* 39* 38*  GLUCOSE 114* 173* 113*  BUN 12 18 36*  CREATININE 1.04 1.14 1.20  CALCIUM  9.0 8.7* 8.6*  MG  --  1.9  --    GFR: Estimated Creatinine Clearance: 91.1 mL/min (by C-G formula based on SCr of 1.2 mg/dL). Liver Function Tests: No results for input(s): AST, ALT, ALKPHOS, BILITOT, PROT, ALBUMIN in the last 168 hours. No results for input(s): LIPASE, AMYLASE in the last 168 hours. No results for input(s): AMMONIA in the last 168 hours. Coagulation Profile: No results for input(s): INR, PROTIME in the last 168 hours. Cardiac Enzymes: No results for input(s): CKTOTAL, CKMB, CKMBINDEX, TROPONINI in the last 168 hours. BNP (last 3 results) Recent  Labs    07/13/24 1835  PROBNP 527.0*   HbA1C: No results for input(s): HGBA1C in the last 72 hours. CBG: No results for input(s): GLUCAP in the last 168 hours. Lipid Profile: No results for input(s): CHOL, HDL, LDLCALC, TRIG, CHOLHDL, LDLDIRECT in the last 72 hours. Thyroid  Function Tests: No results for input(s): TSH, T4TOTAL, FREET4, T3FREE, THYROIDAB in the last 72 hours. Anemia Panel: No results for input(s): VITAMINB12, FOLATE, FERRITIN, TIBC, IRON, RETICCTPCT in the last 72 hours. Most Recent Urinalysis On File:     Component Value Date/Time   COLORURINE AMBER (A) 11/25/2017 1618   APPEARANCEUR CLEAR (A) 11/25/2017 1618   LABSPEC 1.028 11/25/2017 1618   PHURINE 5.0 11/25/2017 1618   GLUCOSEU NEGATIVE 11/25/2017 1618   HGBUR SMALL (A) 11/25/2017 1618   BILIRUBINUR negative 10/21/2022 1103   KETONESUR NEGATIVE 11/25/2017 1618   PROTEINUR Positive (A) 10/21/2022 1103   PROTEINUR 30 (A) 11/25/2017 1618   UROBILINOGEN 0.2 10/21/2022 1103   NITRITE negative 10/21/2022 1103   NITRITE NEGATIVE 11/25/2017 1618   LEUKOCYTESUR Moderate (2+) (A) 10/21/2022 1103   Sepsis Labs: @LABRCNTIP (procalcitonin:4,lacticidven:4) Microbiology: Recent Results (from the past 240 hours)  Resp panel by RT-PCR (RSV, Flu A&B, Covid) Anterior Nasal Swab     Status: None   Collection Time: 07/13/24  6:47 PM   Specimen: Anterior Nasal Swab  Result Value Ref Range Status   SARS Coronavirus 2 by RT PCR NEGATIVE NEGATIVE Final    Comment: (NOTE) SARS-CoV-2 target nucleic acids are NOT DETECTED.  The SARS-CoV-2 RNA is generally detectable in upper respiratory specimens during the acute phase of infection. The lowest concentration of SARS-CoV-2 viral copies this assay can detect is 138 copies/mL. A negative result does not preclude SARS-Cov-2 infection and should not be used as the sole basis for treatment or other patient management decisions. A negative result  may occur with  improper specimen collection/handling, submission of specimen other than nasopharyngeal swab, presence of viral mutation(s) within the areas targeted by this assay, and inadequate number of viral copies(<138 copies/mL). A negative result must be combined with clinical observations, patient history, and epidemiological information. The expected result is Negative.  Fact Sheet for Patients:  bloggercourse.com  Fact Sheet for Healthcare Providers:  seriousbroker.it  This test is no t yet approved or cleared by the [place]  FDA and  has been authorized for detection and/or diagnosis of SARS-CoV-2 by FDA under an Emergency Use Authorization (EUA). This EUA will remain  in effect (meaning this test can be used) for the duration of the COVID-19 declaration under Section 564(b)(1) of the Act, 21 U.S.C.section 360bbb-3(b)(1), unless the authorization is terminated  or revoked sooner.       Influenza A by PCR NEGATIVE NEGATIVE Final   Influenza B by PCR NEGATIVE NEGATIVE Final    Comment: (NOTE) The Xpert Xpress SARS-CoV-2/FLU/RSV plus assay is intended as an aid in the diagnosis of influenza from Nasopharyngeal swab specimens and should not be used as a sole basis for treatment. Nasal washings and aspirates are unacceptable for Xpert Xpress SARS-CoV-2/FLU/RSV testing.  Fact Sheet for Patients: bloggercourse.com  Fact Sheet for Healthcare Providers: seriousbroker.it  This test is not yet approved or cleared by the [place]  FDA and has been authorized for detection and/or diagnosis of SARS-CoV-2 by FDA under an Emergency Use Authorization (EUA). This EUA will remain in effect (meaning this test can be used) for the duration of the COVID-19 declaration under Section 564(b)(1) of the Act, 21 U.S.C. section 360bbb-3(b)(1), unless the authorization is terminated  or revoked.     Resp Syncytial Virus by PCR NEGATIVE NEGATIVE Final    Comment: (NOTE) Fact Sheet for Patients: bloggercourse.com  Fact Sheet for Healthcare Providers: seriousbroker.it  This test is not yet approved or cleared by the [place]  FDA and has been authorized for detection and/or diagnosis of SARS-CoV-2 by FDA under an Emergency Use Authorization (EUA). This EUA will remain in effect (meaning this test can be used) for the duration of the COVID-19 declaration under Section 564(b)(1) of the Act, 21 U.S.C. section 360bbb-3(b)(1), unless the authorization is terminated or revoked.  Performed at [facility], [address] [place], [place] [address]   Respiratory (~20 pathogens) panel by PCR     Status: None   Collection Time: 07/13/24 10:12 PM   Specimen: Nasopharyngeal Swab; Respiratory  Result Value Ref Range Status   Adenovirus NOT DETECTED NOT DETECTED Final   Coronavirus 229E NOT DETECTED NOT DETECTED Final    Comment: (NOTE) The Coronavirus on the Respiratory Panel, DOES NOT test for the novel  Coronavirus (2019 nCoV)    Coronavirus HKU1 NOT DETECTED NOT DETECTED Final   Coronavirus NL63 NOT DETECTED NOT DETECTED Final   Coronavirus OC43 NOT DETECTED NOT DETECTED Final   Metapneumovirus NOT DETECTED NOT DETECTED Final   Rhinovirus / Enterovirus NOT DETECTED NOT DETECTED Final   Influenza A NOT DETECTED NOT DETECTED Final   Influenza B NOT DETECTED NOT DETECTED Final   Parainfluenza Virus 1 NOT DETECTED NOT DETECTED Final   Parainfluenza Virus 2 NOT DETECTED NOT DETECTED Final   Parainfluenza Virus 3 NOT DETECTED NOT DETECTED Final   Parainfluenza Virus 4 NOT DETECTED NOT DETECTED Final   Respiratory Syncytial Virus NOT DETECTED NOT DETECTED Final   Bordetella pertussis NOT DETECTED NOT DETECTED Final   Bordetella Parapertussis NOT DETECTED NOT DETECTED Final   Chlamydophila pneumoniae NOT  DETECTED NOT DETECTED Final   Mycoplasma pneumoniae NOT DETECTED NOT DETECTED Final    Comment: Performed at [facility], [address], [place], [place] [address]      Radiology Studies last 3 days: DG Chest Portable 1  View Result Date: 07/13/2024 EXAM: 1 VIEW(S) XRAY OF THE CHEST 02/24/2023 06:54:20 PM COMPARISON: None available. CLINICAL HISTORY: sob FINDINGS: LUNGS AND PLEURA: Bibasilar atelectasis. No focal pulmonary opacity. No pleural effusion. No pneumothorax. HEART AND MEDIASTINUM: Heart is mildly enlarged. BONES AND SOFT TISSUES: No acute osseous abnormality. IMPRESSION: 1. Mild cardiomegaly. 2. Bibasilar atelectasis. Electronically signed by: Greig Pique MD MD 07/13/2024 06:56 PM EST RP Workstation: [id number]         Laneta Blunt, DO [facility] 07/15/2024, 4:30 PM    Dictation software may have been used to generate the above note. Typos may occur and escape review in typed/dictated notes. Please contact Dr Blunt directly for clarity if needed.  Staff may message me via secure chat in Epic  but this may not receive an immediate response,  please page me for urgent matters!  If 7PM-7AM, please contact night coverage [email or web address]       "

## 2024-07-15 NOTE — ED Notes (Signed)
 NURSE BRANDON RN INFORMED OF BED ASSIGNED

## 2024-07-15 NOTE — ED Notes (Addendum)
 Pt placed back on Bipap at this time. Pt given ordered medication. Pt sts that he feels fine however pt o2 sat is low. RN notified provider and they are at the bedside.

## 2024-07-16 ENCOUNTER — Inpatient Hospital Stay: Admit: 2024-07-16 | Discharge: 2024-07-16 | Disposition: A | Attending: Osteopathic Medicine

## 2024-07-16 DIAGNOSIS — J9621 Acute and chronic respiratory failure with hypoxia: Secondary | ICD-10-CM | POA: Diagnosis not present

## 2024-07-16 DIAGNOSIS — I5033 Acute on chronic diastolic (congestive) heart failure: Secondary | ICD-10-CM | POA: Diagnosis not present

## 2024-07-16 DIAGNOSIS — I5031 Acute diastolic (congestive) heart failure: Secondary | ICD-10-CM | POA: Diagnosis not present

## 2024-07-16 DIAGNOSIS — I482 Chronic atrial fibrillation, unspecified: Secondary | ICD-10-CM | POA: Diagnosis not present

## 2024-07-16 DIAGNOSIS — E782 Mixed hyperlipidemia: Secondary | ICD-10-CM

## 2024-07-16 DIAGNOSIS — I10 Essential (primary) hypertension: Secondary | ICD-10-CM | POA: Diagnosis not present

## 2024-07-16 LAB — BASIC METABOLIC PANEL WITH GFR
Anion gap: 8 (ref 5–15)
BUN: 47 mg/dL — ABNORMAL HIGH (ref 6–20)
CO2: 43 mmol/L — ABNORMAL HIGH (ref 22–32)
Calcium: 9 mg/dL (ref 8.9–10.3)
Chloride: 89 mmol/L — ABNORMAL LOW (ref 98–111)
Creatinine, Ser: 1.25 mg/dL — ABNORMAL HIGH (ref 0.61–1.24)
GFR, Estimated: >60 mL/min
Glucose, Bld: 132 mg/dL — ABNORMAL HIGH (ref 70–99)
Potassium: 4.6 mmol/L (ref 3.5–5.1)
Sodium: 139 mmol/L (ref 135–145)

## 2024-07-16 LAB — CBC
HCT: 43.7 % (ref 39.0–52.0)
Hemoglobin: 13.9 g/dL (ref 13.0–17.0)
MCH: 29.8 pg (ref 26.0–34.0)
MCHC: 31.8 g/dL (ref 30.0–36.0)
MCV: 93.8 fL (ref 80.0–100.0)
Platelets: 208 K/uL (ref 150–400)
RBC: 4.66 MIL/uL (ref 4.22–5.81)
RDW: 12.9 % (ref 11.5–15.5)
WBC: 17.7 K/uL — ABNORMAL HIGH (ref 4.0–10.5)
nRBC: 0 % (ref 0.0–0.2)

## 2024-07-16 LAB — ECHOCARDIOGRAM COMPLETE
AV Peak grad: 0.9 mmHg
Ao pk vel: 0.47 m/s
Area-P 1/2: 3.3 cm2
Height: 69 in
S' Lateral: 2.95 cm
Weight: 4938.3 [oz_av]

## 2024-07-16 LAB — GLUCOSE, CAPILLARY: Glucose-Capillary: 214 mg/dL — ABNORMAL HIGH (ref 70–99)

## 2024-07-16 MED ORDER — METOPROLOL TARTRATE 25 MG PO TABS
37.5000 mg | ORAL_TABLET | Freq: Four times a day (QID) | ORAL | Status: DC
  Administered 2024-07-16 – 2024-07-17 (×5): 37.5 mg via ORAL
  Filled 2024-07-16 (×5): qty 2

## 2024-07-16 MED ORDER — PERFLUTREN LIPID MICROSPHERE
1.0000 mL | INTRAVENOUS | Status: AC | PRN
  Administered 2024-07-16: 4 mL via INTRAVENOUS

## 2024-07-16 MED ORDER — IPRATROPIUM-ALBUTEROL 0.5-2.5 (3) MG/3ML IN SOLN
3.0000 mL | Freq: Four times a day (QID) | RESPIRATORY_TRACT | Status: DC
  Administered 2024-07-16 – 2024-07-17 (×4): 3 mL via RESPIRATORY_TRACT
  Filled 2024-07-16 (×4): qty 3

## 2024-07-16 MED ORDER — ATORVASTATIN CALCIUM 20 MG PO TABS
40.0000 mg | ORAL_TABLET | Freq: Every day | ORAL | Status: DC
  Administered 2024-07-16 – 2024-07-25 (×10): 40 mg via ORAL
  Filled 2024-07-16 (×10): qty 2

## 2024-07-16 MED ORDER — DILTIAZEM HCL 30 MG PO TABS
90.0000 mg | ORAL_TABLET | Freq: Three times a day (TID) | ORAL | Status: DC
  Administered 2024-07-16 – 2024-07-17 (×3): 90 mg via ORAL
  Filled 2024-07-16 (×3): qty 3

## 2024-07-16 NOTE — Progress Notes (Signed)
 " PROGRESS NOTE    Ryan Hatfield   FMW:[id number] DOB: 1965/10/19  DOA: 07/13/2024 Date of Service: 07/16/2024 which is hospital day 3  PCP: Corwin Antu, [facility] course / significant events:   Ryan Hatfield is a 59 y.o. male with medical history significant of dCHF, HTN, HLD, COPD on 2L oxygen , hypothyroidism, GERD, depression with anxiety, A-fib on Eliquis , morbid obesity, smoker, alcohol abuse in remission, medication noncompliance, who presents with SOB.   HPI: Patient states that he is not taking most of his medications including Eliquis  for a while due to insurance issue. (He is not able to recall some medications he's not been taking, reports not on inhalers recently, not sure about all cradiac meds but reports he's been on his eliquis ) He developed SOB in the past 2 days, which has been progressively worsening. He has cough with clear mucus production, no chest pain, fever or chills. Patient is normally using 2 L oxygen  at baseline, but was found to have oxygen  desaturation to 76% on home level 2 L oxygen , initially started 6-8 L oxygen , still has respiratory distress and difficulty speaking in full sentence, then started BiPAP with improvement.  BiPAP is weaned off now, currently on 6 L oxygen  with 100% of saturation.   01/07:  to ED. BNP 527, GFR> 60, troponin 20 --> 21, negative PCR for COVID, flu and RSV. Temperature normal, blood pressure 158/62, heart rate 101 --> 84, RR 21.  Chest x-ray showed cardiomegaly and bibasilar atelectasis.  Patient is admitted to PCU as inpatient. Needing HFNC up to 15 L/min 01/08: BiPAP overnight w/ improvement in Afib rate. Off BiPAP today per patient request, remains on high flow [place] 15 L/min. Continue diuresis. UOP yesterday -1400 mL 01/09: HFNC 11 L/min, no BiPAP overnight. UOP yesterday -1500 mL. Back into Afib RVR, dilt bolus ineffective, starting drip, Echo ordered. D/w cardiology, plan to see him tomorrow but they are aware of his  case in case decompensation / emergent cardioversion needed 01/10: Cardiology following --> dc amiodarone  (pt now notes spotty compliance w/ home Eliquis ), continue diltiazem  infusion, changed to metoprolol  tartrate 37.5 mg q6h, holding lasix  for today. Echo EF 60-65 mod LVH indeterminate diastolic     Consultants:  Cardiology   Procedures/Surgeries: none      ASSESSMENT & PLAN:   Acute on chronic respiratory failure with hypoxia Likely due to combination of COPD and CHF exacerbation.   O2 titrate as needed / wean as able - back on BiPAP this morning  Treat underlying cause(s) as noted  COPD with acute exacerbation  Wheezing on exam  Bronchodilators and prn Mucinex  Solu-Medrol  80 mg IV daily after given 125 mg of Solu-Medrol  Incentive spirometry check RVP Follow up sputum culture   Acute on chronic diastolic CHF (congestive heart failure)  Patient has leg edema, indicating possible CHF exacerbation.  His BNP is 527 which is likely falsely low due to morbid obesity. Last echo 2024 - EF 60-65%, grade 1 diast df  07/16/2024 Echo EF 60-65 mod LVH indeterminate diastolic appears relatively euvolemic on exam although volume status is difficult to assess given body habitus  Lasix  40 mg IV bid --> held for now, anticipate resume po diuretic tomorrow  Consider addition of SGLT2 inhibitor prior to discharge echo pending Daily weights strict I/O's Low salt diet Fluid restriction  Afib RVR Eliquis  - pt reports compliance at home however later admits to taking this intermittently / daily rather than consistent bid dosing -->  consider transition to Xarelto  to help compliance  Diltiazem  drip initiated yesterday, not effective, added amiodatione and consult to cardiology --> holding amiodarone  given uncertain compliance w/ Eliquis  Metoprolol  succinate 150 mg daily --> changed today to metoprolol  tartrate 37.5 mg every 6 hours  Hold Imdur  to allow BP room for rate control.   Telemetry Cardiology following   CAD - Nonobstructive CAD on cath 01/2023  Elevation troponin Likely demand ischemia Repeat as needed EKG as needed chest pain  Atorvastatin , Eliquis    Essential hypertension IV hydralazine  as needed Imdur  held for now Metoprolol  as above  IV diltiazem  as above    Leukocytosis Steroid effect Monitor CBC periodically / as needed  Hypochloremia / Metabolic Alkalosis  Likely mixed respiratory component w/ hypoxia/hyperventilation but CO2 retention w/ habitus/OSA, and would expect some Cl wasting w/ lasix  Monitor BMP  Reduced diuresis today   Acquired hypothyroidism Synthroid    Tobacco abuse Nicotine  patch Did counseling about importance of quitting smoking     Class 3 obesity based on BMI: Body mass index is 45.58 kg/m.SABRA Significantly low or high BMI is associated with higher medical risk.  Underweight - under 18  overweight - 25 to 29 obese - 30 or more Class 1 obesity: BMI of 30.0 to 34 Class 2 obesity: BMI of 35.0 to 39 Class 3 obesity: BMI of 40.0 to 49 Super Morbid Obesity: BMI 50-59 Super-super Morbid Obesity: BMI 60+ Healthy nutrition and physical activity advised as adjunct to other disease management and risk reduction treatments    DVT prophylaxis: eliquis  IV fluids: no continuous IV fluids  Nutrition: cardaic Central lines / other devices: none  Code Status: FULL CODE ACP documentation reviewed:  none on file in [facility]  TOC needs: TBD Medical barriers to dispo: O2 requirement - HFNC on progressive unit. Afib RVR Expected medical readiness for discharge several days.              Subjective / Brief ROS:  Patient reports SOB is a bit better today  Denies CP / palpitations  Pain controlled.  Denies new weakness.  Tolerating diet.  Reports no concerns w/ urination/defecation.   Family Communication: none at this time    Objective Findings:  Vitals:   07/16/24 0700 07/16/24 0737 07/16/24 1121  07/16/24 1340  BP:      Pulse:      Resp:      Temp: 98.1 F (36.7 C)  98.3 F (36.8 C)   TempSrc: Oral  Oral   SpO2:  95%  92%  Weight:      Height:        Intake/Output Summary (Last 24 hours) at 07/16/2024 1355 Last data filed at 07/16/2024 1121 Gross per 24 hour  Intake --  Output 1400 ml  Net -1400 ml   Filed Weights   07/13/24 1834 07/13/24 1904  Weight: (!) 137 kg (!) 140 kg    Examination:  Physical Exam Constitutional:      General: He is not in acute distress.    Appearance: He is obese.  Cardiovascular:     Rate and Rhythm: Tachycardia present. Rhythm irregular.  Pulmonary:     Breath sounds: Decreased breath sounds and wheezing present. No rales (minimal/atelectasis).  Musculoskeletal:     Right lower leg: Edema (trace) present.     Left lower leg: Edema (trace) present.  Neurological:     Mental Status: He is alert and oriented to person, place, and time.  Psychiatric:  Mood and Affect: Mood normal.        Behavior: Behavior normal.          Scheduled Medications:   apixaban   5 mg Oral BID   atorvastatin   40 mg Oral Daily   diltiazem   90 mg Oral Q8H   folic acid   1 mg Oral Daily   ipratropium-albuterol   3 mL Nebulization Q6H   levothyroxine   50 mcg Oral Daily   methylPREDNISolone  (SOLU-MEDROL ) injection  80 mg Intravenous Daily   metoprolol  tartrate  37.5 mg Oral Q6H   nicotine   21 mg Transdermal Daily   pantoprazole   40 mg Oral Daily    Continuous Infusions:  diltiazem  (CARDIZEM ) infusion 15 mg/hr (07/16/24 0607)    PRN Medications:  acetaminophen , albuterol , dextromethorphan-guaiFENesin , nitroGLYCERIN   Antimicrobials from admission:  Anti-infectives (From admission, onward)    None           Data Reviewed:  I have personally reviewed the following...  CBC: Recent Labs  Lab 07/13/24 1835 07/14/24 0448 07/16/24 0533  WBC 10.4 9.5 17.7*  NEUTROABS 8.9*  --   --   HGB 14.3 14.3 13.9  HCT 44.5 44.5 43.7  MCV  92.9 93.5 93.8  PLT 198 198 208   Basic Metabolic Panel: Recent Labs  Lab 07/13/24 1835 07/14/24 0448 07/15/24 0439 07/16/24 0533  NA 138 137 137 139  K 4.4 4.8 4.1 4.6  CL 91* 90* 89* 89*  CO2 37* 39* 38* 43*  GLUCOSE 114* 173* 113* 132*  BUN 12 18 36* 47*  CREATININE 1.04 1.14 1.20 1.25*  CALCIUM  9.0 8.7* 8.6* 9.0  MG  --  1.9  --   --    GFR: Estimated Creatinine Clearance: 87.5 mL/min (A) (by C-G formula based on SCr of 1.25 mg/dL (H)). Liver Function Tests: No results for input(s): AST, ALT, ALKPHOS, BILITOT, PROT, ALBUMIN in the last 168 hours. No results for input(s): LIPASE, AMYLASE in the last 168 hours. No results for input(s): AMMONIA in the last 168 hours. Coagulation Profile: No results for input(s): INR, PROTIME in the last 168 hours. Cardiac Enzymes: No results for input(s): CKTOTAL, CKMB, CKMBINDEX, TROPONINI in the last 168 hours. BNP (last 3 results) Recent Labs    07/13/24 1835  PROBNP 527.0*   HbA1C: No results for input(s): HGBA1C in the last 72 hours. CBG: No results for input(s): GLUCAP in the last 168 hours. Lipid Profile: No results for input(s): CHOL, HDL, LDLCALC, TRIG, CHOLHDL, LDLDIRECT in the last 72 hours. Thyroid  Function Tests: No results for input(s): TSH, T4TOTAL, FREET4, T3FREE, THYROIDAB in the last 72 hours. Anemia Panel: No results for input(s): VITAMINB12, FOLATE, FERRITIN, TIBC, IRON, RETICCTPCT in the last 72 hours. Most Recent Urinalysis On File:     Component Value Date/Time   COLORURINE AMBER (A) 11/25/2017 1618   APPEARANCEUR CLEAR (A) 11/25/2017 1618   LABSPEC 1.028 11/25/2017 1618   PHURINE 5.0 11/25/2017 1618   GLUCOSEU NEGATIVE 11/25/2017 1618   HGBUR SMALL (A) 11/25/2017 1618   BILIRUBINUR negative 10/21/2022 1103   KETONESUR NEGATIVE 11/25/2017 1618   PROTEINUR Positive (A) 10/21/2022 1103   PROTEINUR 30 (A) 11/25/2017 1618   UROBILINOGEN  0.2 10/21/2022 1103   NITRITE negative 10/21/2022 1103   NITRITE NEGATIVE 11/25/2017 1618   LEUKOCYTESUR Moderate (2+) (A) 10/21/2022 1103   Sepsis Labs: @LABRCNTIP (procalcitonin:4,lacticidven:4) Microbiology: Recent Results (from the past 240 hours)  Resp panel by RT-PCR (RSV, Flu A&B, Covid) Anterior Nasal Swab     Status: None  Collection Time: 07/13/24  6:47 PM   Specimen: Anterior Nasal Swab  Result Value Ref Range Status   SARS Coronavirus 2 by RT PCR NEGATIVE NEGATIVE Final    Comment: (NOTE) SARS-CoV-2 target nucleic acids are NOT DETECTED.  The SARS-CoV-2 RNA is generally detectable in upper respiratory specimens during the acute phase of infection. The lowest concentration of SARS-CoV-2 viral copies this assay can detect is 138 copies/mL. A negative result does not preclude SARS-Cov-2 infection and should not be used as the sole basis for treatment or other patient management decisions. A negative result may occur with  improper specimen collection/handling, submission of specimen other than nasopharyngeal swab, presence of viral mutation(s) within the areas targeted by this assay, and inadequate number of viral copies(<138 copies/mL). A negative result must be combined with clinical observations, patient history, and epidemiological information. The expected result is Negative.  Fact Sheet for Patients:  bloggercourse.com  Fact Sheet for Healthcare Providers:  seriousbroker.it  This test is no t yet approved or cleared by the [place]  FDA and  has been authorized for detection and/or diagnosis of SARS-CoV-2 by FDA under an Emergency Use Authorization (EUA). This EUA will remain  in effect (meaning this test can be used) for the duration of the COVID-19 declaration under Section 564(b)(1) of the Act, 21 U.S.C.section 360bbb-3(b)(1), unless the authorization is terminated  or revoked sooner.       Influenza  A by PCR NEGATIVE NEGATIVE Final   Influenza B by PCR NEGATIVE NEGATIVE Final    Comment: (NOTE) The Xpert Xpress SARS-CoV-2/FLU/RSV plus assay is intended as an aid in the diagnosis of influenza from Nasopharyngeal swab specimens and should not be used as a sole basis for treatment. Nasal washings and aspirates are unacceptable for Xpert Xpress SARS-CoV-2/FLU/RSV testing.  Fact Sheet for Patients: bloggercourse.com  Fact Sheet for Healthcare Providers: seriousbroker.it  This test is not yet approved or cleared by the [place]  FDA and has been authorized for detection and/or diagnosis of SARS-CoV-2 by FDA under an Emergency Use Authorization (EUA). This EUA will remain in effect (meaning this test can be used) for the duration of the COVID-19 declaration under Section 564(b)(1) of the Act, 21 U.S.C. section 360bbb-3(b)(1), unless the authorization is terminated or revoked.     Resp Syncytial Virus by PCR NEGATIVE NEGATIVE Final    Comment: (NOTE) Fact Sheet for Patients: bloggercourse.com  Fact Sheet for Healthcare Providers: seriousbroker.it  This test is not yet approved or cleared by the [place]  FDA and has been authorized for detection and/or diagnosis of SARS-CoV-2 by FDA under an Emergency Use Authorization (EUA). This EUA will remain in effect (meaning this test can be used) for the duration of the COVID-19 declaration under Section 564(b)(1) of the Act, 21 U.S.C. section 360bbb-3(b)(1), unless the authorization is terminated or revoked.  Performed at [facility], [address] [place], [place] [address]   Respiratory (~20 pathogens) panel by PCR     Status: None   Collection Time: 07/13/24 10:12 PM   Specimen: Nasopharyngeal Swab; Respiratory  Result Value Ref Range Status   Adenovirus NOT DETECTED NOT DETECTED Final   Coronavirus 229E NOT  DETECTED NOT DETECTED Final    Comment: (NOTE) The Coronavirus on the Respiratory Panel, DOES NOT test for the novel  Coronavirus (2019 nCoV)    Coronavirus HKU1 NOT DETECTED NOT DETECTED Final   Coronavirus NL63 NOT DETECTED NOT DETECTED Final   Coronavirus OC43 NOT DETECTED NOT DETECTED Final  Metapneumovirus NOT DETECTED NOT DETECTED Final   Rhinovirus / Enterovirus NOT DETECTED NOT DETECTED Final   Influenza A NOT DETECTED NOT DETECTED Final   Influenza B NOT DETECTED NOT DETECTED Final   Parainfluenza Virus 1 NOT DETECTED NOT DETECTED Final   Parainfluenza Virus 2 NOT DETECTED NOT DETECTED Final   Parainfluenza Virus 3 NOT DETECTED NOT DETECTED Final   Parainfluenza Virus 4 NOT DETECTED NOT DETECTED Final   Respiratory Syncytial Virus NOT DETECTED NOT DETECTED Final   Bordetella pertussis NOT DETECTED NOT DETECTED Final   Bordetella Parapertussis NOT DETECTED NOT DETECTED Final   Chlamydophila pneumoniae NOT DETECTED NOT DETECTED Final   Mycoplasma pneumoniae NOT DETECTED NOT DETECTED Final    Comment: Performed at [facility], [address], [place], [place] [address]      Radiology Studies last 3 days: ECHOCARDIOGRAM COMPLETE Result Date: 07/16/2024    ECHOCARDIOGRAM REPORT   Patient Name:   Ryan Hatfield [facility] Date of Exam: 07/16/2024 Medical Rec #:  [id number]         Height:       69.0 in Accession #:    [id number]        Weight:       308.6 lb Date of Birth:  1965/12/15         BSA:          2.484 m Patient Age:    58 years          BP:           105/61 mmHg Patient Gender: M                 HR:           84 bpm. Exam Location:  [facility] Procedure: 2D Echo, Cardiac Doppler, Color Doppler and Intracardiac            Opacification Agent (Both Spectral and Color Flow Doppler were            utilized during procedure). Indications:     CHF I50.31  History:         Patient has prior history of Echocardiogram examinations, most                  recent 05/05/2023.  Sonographer:      Thedora Louder RDCS, FASE Referring Phys:  [id number] LANETA BLUNT Diagnosing Phys: Annabella Scarce MD  Sonographer Comments: Technically difficult study due to poor echo windows, suboptimal apical window and patient is obese. Image acquisition challenging due to patient body habitus and Image acquisition challenging due to respiratory motion. The patient was on 12L of oxygen  at the time of this study. IMPRESSIONS  1. Left ventricular ejection fraction, by estimation, is 60 to 65%. Left ventricular ejection fraction by PLAX is 63 %. The left ventricle has normal function. The left ventricle has no regional wall motion abnormalities. There is moderate concentric left ventricular hypertrophy. Left ventricular diastolic parameters are indeterminate.  2. Right ventricular systolic function is normal. The right ventricular size is normal.  3. The mitral valve is normal in structure. No evidence of mitral valve regurgitation. No evidence of mitral stenosis.  4. The aortic valve is tricuspid. Aortic valve regurgitation is not visualized. No aortic stenosis is present.  5. The inferior vena cava is dilated in size with >50% respiratory variability, suggesting right atrial pressure of 8 mmHg. FINDINGS  Left Ventricle: Left ventricular ejection fraction, by estimation, is 60 to 65%. Left ventricular ejection  fraction by PLAX is 63 %. The left ventricle has normal function. The left ventricle has no regional wall motion abnormalities. Definity  contrast agent was given IV to delineate the left ventricular endocardial borders. The left ventricular internal cavity size was normal in size. There is moderate concentric left ventricular hypertrophy. Left ventricular diastolic function could not be evaluated due to atrial fibrillation. Left ventricular diastolic parameters are indeterminate. Normal left ventricular filling pressure. Right Ventricle: The right ventricular size is normal. No increase in right ventricular wall  thickness. Right ventricular systolic function is normal. Left Atrium: Left atrial size was normal in size. Right Atrium: Right atrial size was normal in size. Pericardium: There is no evidence of pericardial effusion. Mitral Valve: The mitral valve is normal in structure. Mild mitral annular calcification. No evidence of mitral valve regurgitation. No evidence of mitral valve stenosis. Tricuspid Valve: The tricuspid valve is normal in structure. Tricuspid valve regurgitation is not demonstrated. No evidence of tricuspid stenosis. Aortic Valve: The aortic valve is tricuspid. Aortic valve regurgitation is not visualized. No aortic stenosis is present. Aortic valve peak gradient measures 0.9 mmHg. Pulmonic Valve: The pulmonic valve was normal in structure. Pulmonic valve regurgitation is not visualized. No evidence of pulmonic stenosis. Aorta: The aortic root is normal in size and structure. Venous: The inferior vena cava is dilated in size with greater than 50% respiratory variability, suggesting right atrial pressure of 8 mmHg. IAS/Shunts: No atrial level shunt detected by color flow Doppler.  LEFT VENTRICLE PLAX 2D LV EF:         Left            Diastology                ventricular     LV e' medial:    11.40 cm/s                ejection        LV E/e' medial:  9.4                fraction by     LV e' lateral:   9.36 cm/s                PLAX is 63      LV E/e' lateral: 11.4                %. LVIDd:         4.45 cm LVIDs:         2.95 cm LV PW:         1.40 cm LV IVS:        1.30 cm LVOT diam:     2.40 cm LVOT Area:     4.52 cm  RIGHT VENTRICLE RV Basal diam:  3.00 cm RV S prime:     11.40 cm/s TAPSE (M-mode): 1.8 cm LEFT ATRIUM             Index        RIGHT ATRIUM           Index LA diam:        3.90 cm 1.57 cm/m   RA Area:     19.80 cm LA Vol (A2C):   49.3 ml 19.84 ml/m  RA Volume:   54.30 ml  21.86 ml/m LA Vol (A4C):   34.9 ml 14.05 ml/m LA Biplane Vol: 45.6 ml 18.35 ml/m  AORTIC VALVE  PULMONIC VALVE AV Vmax:      47.10 cm/s PV Vmax:        1.14 m/s AV Peak Grad: 0.9 mmHg   PV Peak grad:   5.2 mmHg                          RVOT Peak grad: 5 mmHg AORTA Ao Root diam: 3.20 cm Ao Asc diam:  3.10 cm MITRAL VALVE MV Area (PHT): 3.30 cm     SHUNTS MV Decel Time: 230 msec     Systemic Diam: 2.40 cm MV E velocity: 107.00 cm/s Annabella Scarce MD Electronically signed by Annabella Scarce MD Signature Date/Time: 07/16/2024/1:12:06 PM    Final    DG Chest Portable 1 View Result Date: 07/13/2024 EXAM: 1 VIEW(S) XRAY OF THE CHEST 02/24/2023 06:54:20 PM COMPARISON: None available. CLINICAL HISTORY: sob FINDINGS: LUNGS AND PLEURA: Bibasilar atelectasis. No focal pulmonary opacity. No pleural effusion. No pneumothorax. HEART AND MEDIASTINUM: Heart is mildly enlarged. BONES AND SOFT TISSUES: No acute osseous abnormality. IMPRESSION: 1. Mild cardiomegaly. 2. Bibasilar atelectasis. Electronically signed by: Greig Pique MD MD 07/13/2024 06:56 PM EST RP Workstation: [id number]         Laneta Blunt, DO [facility] 07/16/2024, 1:55 PM    Dictation software may have been used to generate the above note. Typos may occur and escape review in typed/dictated notes. Please contact Dr Blunt directly for clarity if needed.  Staff may message me via secure chat in Epic  but this may not receive an immediate response,  please page me for urgent matters!  If 7PM-7AM, please contact night coverage [email or web address]       "

## 2024-07-16 NOTE — Plan of Care (Signed)
" °  Problem: Education: Goal: Knowledge of disease or condition will improve Outcome: Progressing Goal: Knowledge of the prescribed therapeutic regimen will improve Outcome: Progressing Goal: Individualized Educational Video(s) Outcome: Progressing   Problem: Activity: Goal: Will verbalize the importance of balancing activity with adequate rest periods Outcome: Progressing   Problem: Respiratory: Goal: Ability to maintain adequate ventilation will improve Outcome: Progressing   Problem: Education: Goal: Knowledge of General Education information will improve Description: Including pain rating scale, medication(s)/side effects and non-pharmacologic comfort measures Outcome: Progressing   Problem: Health Behavior/Discharge Planning: Goal: Ability to manage health-related needs will improve Outcome: Progressing   "

## 2024-07-16 NOTE — Consult Note (Signed)
 "  Cardiology Consultation   Patient ID: Ryan Hatfield MRN: [id number]; DOB: 11-07-1965  Admit date: 07/13/2024 Date of Consult: 07/16/2024  PCP:  Corwin Antu, FNP   [facility] Providers Cardiologist:  Redell Cave, MD  Electrophysiologist:  OLE ONEIDA HOLTS, MD (Inactive)       Patient Profile: Ryan Hatfield is a 59 y.o. male with a hx of nonobstructive CAD, chronic HFpEF, atrial flutter s/p TEE cardioversion 01/2023, hypertension, hyperlipidemia, OSA, COPD, gout, and tobacco abuse who is being seen 07/16/2024 for the evaluation of atrial fibrillation with RVR at the request of Dr. Marsa.  History of Present Illness:   Ryan Hatfield was first evaluated by Dr. Cave 02/2022 for chest pain.  He reported a several month history of chest pain and shortness of breath with exertion.  Echo revealed EF 60 to 65% with no RWMA, mild LVH, LVOT gradient with Valsalva 29 mmHg, G1 DD, mild LAE.  CTA was ordered but not completed.  Patient was seen in the ED 01/2023 with worsening lower extremity edema bilaterally despite compliance with his Lasix  and taking extra doses.  He reported dyspnea with associated weight gain, PND, and orthopnea.  He also reported daily chest pain without exertion associated with nausea and diaphoresis.  He was in atrial flutter with RVR at 156 bpm and hypoxic.  He was started on Cardizem  drip.  Troponin 20 > 19.  Echo with EF 55 to 60%.  He underwent right and left heart catheterization which showed nonobstructive CAD and moderate elevation of right heart pressures with moderate pulmonary hypertension.  He underwent TEE cardioversion and was discharged 02/06/2023.  He was evaluated by EP 04/2023.  It was felt management of atrial flutter was significantly complicated by profound obesity, COPD requiring oxygen , and ongoing tobacco use.  He was felt to be a poor candidate for catheter ablation.  Rhythm control was indicated.    Patient reports that his  brother was sick earlier this week.  He had gone to the store to get Tylenol  and orange juice for his brother and by the time he got home, he had started to feel unwell.  He reports shortness of breath, worse than his baseline with associated fatigue.  He noted worsening lower extremity swelling over the past couple of days prior to admission.  Shortness of breath worsened throughout the day and he required more oxygen  at home.  He called EMS for further evaluation.  He was found to be at an SpO2 of 76% on 2 L of nasal cannula.  In the ED, BP 161/63, HR 101 bpm, RR 21.  CBC and BMP largely unremarkable.  proBNP minimally elevated at 527.  Troponin minimally elevated at 20 > 21.  Chest x-ray showed cardiomegaly and bibasilar atelectasis.  Initial EKG revealed normal sinus rhythm.  He was admitted for COPD and CHF exacerbation.  Supplemental oxygen  was titrated up to HFNC 15 L/min and patient was eventually placed on BiPAP on 1/8 which she did not tolerate and was placed back on HFNC.  On 1/9 he was noted to be in atrial fibrillation/flutter with RVR and was started on diltiazem  bolus and infusion.  Rates remained elevated and he was placed on IV amiodarone .  Cardiology was asked to consult for further evaluation of atrial fibrillation with RVR.  At time of cardiology consult, patient reports some improvement in symptoms.  He remains short of breath.  Reports improvement in lower extremity swelling.  He denies chest pain, palpitations, lightheadedness,  and dizziness.  Past Medical History:  Diagnosis Date   Acute appendicitis    Alcohol abuse, in remission 05/29/2015   Anxiety and depression 05/29/2015   Arthritis, degenerative 02/03/2013   Overview:  Of right middle finger MCP joint    Cannot sleep 02/03/2013   Chest pain 01/15/2013   Hypertension    Plantar fasciitis of left foot 01/17/2016    Past Surgical History:  Procedure Laterality Date   CARDIOVERSION N/A 02/04/2023   Procedure: CARDIOVERSION;   Surgeon: Kate Lonni CROME, MD;  Location: [facility] INVASIVE CV LAB;  Service: Cardiovascular;  Laterality: N/A;   LAPAROSCOPIC APPENDECTOMY N/A 11/25/2017   Procedure: APPENDECTOMY LAPAROSCOPIC;  Surgeon: Wonda Charlie BRAVO, MD;  Location: [facility] ORS;  Service: General;  Laterality: N/A;   RIGHT/LEFT HEART CATH AND CORONARY ANGIOGRAPHY N/A 02/02/2023   Procedure: RIGHT/LEFT HEART CATH AND CORONARY ANGIOGRAPHY;  Surgeon: Burnard Debby LABOR, MD;  Location: [facility] INVASIVE CV LAB;  Service: Cardiovascular;  Laterality: N/A;   SEPTOPLASTY     TEE WITHOUT CARDIOVERSION N/A 02/04/2023   Procedure: TRANSESOPHAGEAL ECHOCARDIOGRAM;  Surgeon: Kate Lonni CROME, MD;  Location: [facility] INVASIVE CV LAB;  Service: Cardiovascular;  Laterality: N/A;   TONSILLECTOMY         Scheduled Meds:  apixaban   5 mg Oral BID   folic acid   1 mg Oral Daily   furosemide   40 mg Intravenous Q12H   ipratropium-albuterol   3 mL Nebulization Q4H   isosorbide  mononitrate  30 mg Oral Daily   levothyroxine   50 mcg Oral Daily   methylPREDNISolone  (SOLU-MEDROL ) injection  80 mg Intravenous Daily   metoprolol  succinate  150 mg Oral Daily   nicotine   21 mg Transdermal Daily   pantoprazole   40 mg Oral Daily   Continuous Infusions:  amiodarone  30 mg/hr (07/16/24 0310)   diltiazem  (CARDIZEM ) infusion 15 mg/hr (07/16/24 0607)   PRN Meds: acetaminophen , albuterol , dextromethorphan-guaiFENesin , nitroGLYCERIN   Allergies:   Allergies[1]  Social History:   Social History   Socioeconomic History   Marital status: Divorced    Spouse name: Not on file   Number of children: Not on file   Years of education: Not on file   Highest education level: Not on file  Occupational History   Occupation: supervisor    Comment: [employer]  Tobacco Use   Smoking status: Every Day    Current packs/day: 1.00    Average packs/day: 1 pack/day for 40.0 years (40.0 ttl pk-yrs)    Types: Cigarettes   Smokeless tobacco: Never   Tobacco comments:     4-5 cigarettes a day, but not everyday. - 06/22/2024 khj   Vaping Use   Vaping status: Former  Substance and Sexual Activity   Alcohol use: Yes    Comment: occassional   Drug use: No   Sexual activity: Yes    Partners: Female  Other Topics Concern   Not on file  Social History Narrative   Not on file   Social Drivers of Health   Tobacco Use: High Risk (07/13/2024)   Patient History    Smoking Tobacco Use: Every Day    Smokeless Tobacco Use: Never    Passive Exposure: Not on file  Financial Resource Strain: Not on file  Food Insecurity: Unknown (07/15/2024)   Epic    Worried About Programme Researcher, Broadcasting/film/video in the Last Year: Patient declined    Barista in the Last Year: Not on file  Transportation Needs: No Transportation Needs (01/30/2023)   PRAPARE -  Administrator, Civil Service (Medical): No    Lack of Transportation (Non-Medical): No  Physical Activity: Not on file  Stress: Not on file  Social Connections: Not on file  Intimate Partner Violence: Not At Risk (01/30/2023)   Humiliation, Afraid, Rape, and Kick questionnaire    Fear of Current or Ex-Partner: No    Emotionally Abused: No    Physically Abused: No    Sexually Abused: No  Depression (PHQ2-9): Medium Risk (04/02/2023)   Depression (PHQ2-9)    PHQ-2 Score: 6  Alcohol Screen: Not on file  Housing: Low Risk (01/30/2023)   Housing    Last Housing Risk Score: 0  Utilities: Not At Risk (07/16/2024)   Epic    Threatened with loss of utilities: No  Health Literacy: Not on file    Family History:    Family History  Problem Relation Age of Onset   Heart disease Mother    Arthritis Mother    Heart disease Father    Diabetes Brother    Stroke Brother    Cancer Maternal Grandmother    Prostate cancer Paternal Grandfather    Kidney cancer Paternal Uncle      ROS:  Please see the history of present illness.   Physical Exam/Data: Vitals:   07/16/24 0300 07/16/24 0317 07/16/24 0400 07/16/24 0500   BP:  109/76 (!) 117/58 105/61  Pulse: (!) 104 86 81 98  Resp: (!) 24 15 13  (!) 21  Temp:      TempSrc:      SpO2: 98% 97% 98% 97%  Weight:      Height:        Intake/Output Summary (Last 24 hours) at 07/16/2024 0723 Last data filed at 07/16/2024 9392 Gross per 24 hour  Intake --  Output 1000 ml  Net -1000 ml      07/13/2024    7:04 PM 07/13/2024    6:34 PM 06/22/2024    3:24 PM  Last 3 Weights  Weight (lbs) 308 lb 10.3 oz 302 lb 0.5 oz 303 lb 12.8 oz  Weight (kg) 140 kg 137 kg 137.803 kg     Body mass index is 45.58 kg/m.  General:  Well nourished, well developed, in no acute distress HEENT: normal Neck: no JVD Vascular: No carotid bruits; Distal pulses 2+ bilaterally Cardiac: IRIR; regular rhythm, normal S1, S2; no murmur Lungs: Diffuse wheezing Abd: soft, nontender, no hepatomegaly  Ext: no edema Skin: warm and dry  Psych:  Normal affect   EKG:  The EKG from 1/9 was personally reviewed and demonstrates: Atrial fibrillation, rate 153 bpm Telemetry:  Telemetry was personally reviewed and demonstrates: Atrial fibrillation, rate 80 to 100 bpm  Relevant CV Studies:  04/2023 2D echo 1. Left ventricular ejection fraction, by estimation, is 60 to 65%. The  left ventricle has normal function. The left ventricle has no regional  wall motion abnormalities. Left ventricular diastolic parameters are  consistent with Grade I diastolic  dysfunction (impaired relaxation).   2. Right ventricular systolic function is normal. The right ventricular  size is normal. Tricuspid regurgitation signal is inadequate for assessing  PA pressure.   3. The mitral valve is normal in structure. Mild mitral valve  regurgitation. No evidence of mitral stenosis. Moderate mitral annular  calcification.   4. The aortic valve is normal in structure. Aortic valve regurgitation is  not visualized. Aortic valve sclerosis/calcification is present, without  any evidence of aortic stenosis.   5. The  inferior  vena cava is normal in size with greater than 50%  respiratory variability, suggesting right atrial pressure of 3 mmHg.   Laboratory Data: High Sensitivity Troponin:  No results for input(s): TROPONINIHS in the last 720 hours.  Recent Labs  Lab 07/13/24 1835 07/13/24 2212 07/14/24 0448 07/15/24 0439  TRNPT 20* 21* 16 22*      Chemistry Recent Labs  Lab 07/14/24 0448 07/15/24 0439 07/16/24 0533  NA 137 137 139  K 4.8 4.1 4.6  CL 90* 89* 89*  CO2 39* 38* 43*  GLUCOSE 173* 113* 132*  BUN 18 36* 47*  CREATININE 1.14 1.20 1.25*  CALCIUM  8.7* 8.6* 9.0  MG 1.9  --   --   GFRNONAA >60 >60 >60  ANIONGAP 8 10 8     No results for input(s): PROT, ALBUMIN, AST, ALT, ALKPHOS, BILITOT in the last 168 hours. Lipids No results for input(s): CHOL, TRIG, HDL, LABVLDL, LDLCALC, CHOLHDL in the last 168 hours.  Hematology Recent Labs  Lab 07/13/24 1835 07/14/24 0448 07/16/24 0533  WBC 10.4 9.5 17.7*  RBC 4.79 4.76 4.66  HGB 14.3 14.3 13.9  HCT 44.5 44.5 43.7  MCV 92.9 93.5 93.8  MCH 29.9 30.0 29.8  MCHC 32.1 32.1 31.8  RDW 12.8 12.5 12.9  PLT 198 198 208   Thyroid  No results for input(s): TSH, FREET4 in the last 168 hours.  BNP Recent Labs  Lab 07/13/24 1835  PROBNP 527.0*    DDimer No results for input(s): DDIMER in the last 168 hours.  Radiology/Studies:  DG Chest Portable 1 View Result Date: 07/13/2024 IMPRESSION: 1. Mild cardiomegaly. 2. Bibasilar atelectasis. Electronically signed by: Greig Pique MD MD 07/13/2024 06:56 PM EST RP Workstation: [id number]    Assessment and Plan:  Atrial fibrillation with RVR - Originally presenting 1/7 for worsening shortness of breath with initial EKG revealing normal sinus rhythm - Noted to be in atrial flutter on 1/8 with repeat EKG 1/9 showing atrial fibrillation - Was started on diltiazem  infusion without improvement in rates and subsequently started on amiodarone  - Remains in atrial  fibrillation with rates reasonably well-controlled 80 to 100 bpm - Patient reports intermittent compliance with Eliquis  prior to admission, often taking this only once per day - Given that patient has not been compliant with anticoagulation in the outpatient setting, would favor discontinuation of amiodarone  to prevent pharmacological conversion to sinus rhythm - Will continue rate control with diltiazem  infusion and metoprolol  to tartrate 37.5 mg every 6 hours - Hold Imdur  to allow BP room for rate control. - Continue Eliquis  5 mg twice daily.  Consider transitioning to Xarelto  given difficulty with compliance. - Echo pending  Acute on chronic HFpEF - Most recent echo 04/2023 with EF 60 to 65% and G1 DD - proBNP minimally elevated on admission of 527 - Received IV Lasix  60 mg x 1 in the ED and has been continued on IV Lasix  40 mg twice daily for the past 2 days - Patient appears relatively euvolemic on exam although volume status is difficult to assess given body habitus - Creatinine and BUN worsening today, will hold IV diuresis.  Can likely resume oral diuretic tomorrow - Consider addition of SGLT2 inhibitor prior to discharge - Repeat echo pending  Acute on chronic respiratory failure  COPD exacerbation - In the setting of acute on chronic CHF and COPD exacerbation - Hold diuretics as above - Remains on 12 L/min of O2 on HFNC - Ongoing management per IM  CAD Elevated troponin -  Nonobstructive CAD on cath 01/2023 - Denies chest pain - Troponin minimally elevated and flat trending, not consistent with ACS - EKG without acute ischemic changes - No ischemic evaluation indicated - Resume PTA atorvastatin .  Eliquis  in place of aspirin .  Hypertension - Hold Imdur  to allow BP room for rate control - IV diltiazem  and oral metoprolol  as above  Tobacco use - Recommend cessation   Risk Assessment/Risk Scores:  CHA2DS2-VASc Score = 3   This indicates a 3.2% annual risk of  stroke. The patient's score is based upon: CHF History: 1 HTN History: 1 Diabetes History: 0 Stroke History: 0 Vascular Disease History: 1 Age Score: 0 Gender Score: 0      For questions or updates, please contact [facility] Please consult [email or web address] for contact info under      Signed, Lesley LITTIE Maffucci, PA-C  07/16/2024 7:23 AM     [1]  Allergies Allergen Reactions   Lisinopril  Cough   Paroxetine     REACTION: paranoia, confusion   Sertraline Hcl     REACTION: paranoia, confusion   "

## 2024-07-16 NOTE — Progress Notes (Signed)
" °  Echocardiogram 2D Echocardiogram has been performed. Definity  IV ultrasound imaging agent used on this study.  Thedora GORMAN Louder 07/16/2024, 9:24 AM "

## 2024-07-16 NOTE — Plan of Care (Signed)
   Problem: Education: Goal: Knowledge of disease or condition will improve Outcome: Progressing Goal: Knowledge of the prescribed therapeutic regimen will improve Outcome: Progressing Goal: Individualized Educational Video(s) Outcome: Progressing   Problem: Activity: Goal: Ability to tolerate increased activity will improve Outcome: Progressing Goal: Will verbalize the importance of balancing activity with adequate rest periods Outcome: Progressing   Problem: Respiratory: Goal: Ability to maintain a clear airway will improve Outcome: Progressing Goal: Levels of oxygenation will improve Outcome: Progressing Goal: Ability to maintain adequate ventilation will improve Outcome: Progressing   Problem: Education: Goal: Knowledge of General Education information will improve Description: Including pain rating scale, medication(s)/side effects and non-pharmacologic comfort measures Outcome: Progressing   Problem: Health Behavior/Discharge Planning: Goal: Ability to manage health-related needs will improve Outcome: Progressing   Problem: Clinical Measurements: Goal: Ability to maintain clinical measurements within normal limits will improve Outcome: Progressing Goal: Will remain free from infection Outcome: Progressing Goal: Diagnostic test results will improve Outcome: Progressing Goal: Respiratory complications will improve Outcome: Progressing Goal: Cardiovascular complication will be avoided Outcome: Progressing   Problem: Activity: Goal: Risk for activity intolerance will decrease Outcome: Progressing   Problem: Nutrition: Goal: Adequate nutrition will be maintained Outcome: Progressing   Problem: Coping: Goal: Level of anxiety will decrease Outcome: Progressing   Problem: Elimination: Goal: Will not experience complications related to bowel motility Outcome: Progressing Goal: Will not experience complications related to urinary retention Outcome: Progressing    Problem: Pain Managment: Goal: General experience of comfort will improve and/or be controlled Outcome: Progressing   Problem: Safety: Goal: Ability to remain free from injury will improve Outcome: Progressing   Problem: Skin Integrity: Goal: Risk for impaired skin integrity will decrease Outcome: Progressing

## 2024-07-17 ENCOUNTER — Other Ambulatory Visit: Payer: Self-pay

## 2024-07-17 ENCOUNTER — Inpatient Hospital Stay

## 2024-07-17 DIAGNOSIS — I482 Chronic atrial fibrillation, unspecified: Secondary | ICD-10-CM

## 2024-07-17 DIAGNOSIS — J9621 Acute and chronic respiratory failure with hypoxia: Secondary | ICD-10-CM | POA: Diagnosis not present

## 2024-07-17 DIAGNOSIS — I5033 Acute on chronic diastolic (congestive) heart failure: Secondary | ICD-10-CM | POA: Diagnosis not present

## 2024-07-17 LAB — BASIC METABOLIC PANEL WITH GFR
Anion gap: 6 (ref 5–15)
BUN: 40 mg/dL — ABNORMAL HIGH (ref 6–20)
CO2: 44 mmol/L — ABNORMAL HIGH (ref 22–32)
Calcium: 9.4 mg/dL (ref 8.9–10.3)
Chloride: 93 mmol/L — ABNORMAL LOW (ref 98–111)
Creatinine, Ser: 1.01 mg/dL (ref 0.61–1.24)
GFR, Estimated: >60 mL/min
Glucose, Bld: 142 mg/dL — ABNORMAL HIGH (ref 70–99)
Potassium: 5.1 mmol/L (ref 3.5–5.1)
Sodium: 143 mmol/L (ref 135–145)

## 2024-07-17 LAB — GLUCOSE, CAPILLARY
Glucose-Capillary: 127 mg/dL — ABNORMAL HIGH (ref 70–99)
Glucose-Capillary: 185 mg/dL — ABNORMAL HIGH (ref 70–99)

## 2024-07-17 MED ORDER — BUDESONIDE 0.5 MG/2ML IN SUSP
0.5000 mg | Freq: Two times a day (BID) | RESPIRATORY_TRACT | Status: DC
  Administered 2024-07-17 – 2024-07-25 (×15): 0.5 mg via RESPIRATORY_TRACT
  Filled 2024-07-17 (×15): qty 2

## 2024-07-17 MED ORDER — RIVAROXABAN 20 MG PO TABS
20.0000 mg | ORAL_TABLET | Freq: Every day | ORAL | Status: DC
  Administered 2024-07-17 – 2024-07-24 (×7): 20 mg via ORAL
  Filled 2024-07-17 (×7): qty 1

## 2024-07-17 MED ORDER — SODIUM CHLORIDE 0.9 % IV SOLN
2.0000 g | INTRAVENOUS | Status: AC
  Administered 2024-07-17 – 2024-07-22 (×5): 2 g via INTRAVENOUS
  Filled 2024-07-17 (×5): qty 20

## 2024-07-17 MED ORDER — IOHEXOL 350 MG/ML SOLN
75.0000 mL | Freq: Once | INTRAVENOUS | Status: AC | PRN
  Administered 2024-07-17: 75 mL via INTRAVENOUS

## 2024-07-17 MED ORDER — IPRATROPIUM-ALBUTEROL 0.5-2.5 (3) MG/3ML IN SOLN
3.0000 mL | RESPIRATORY_TRACT | Status: DC
  Administered 2024-07-17 – 2024-07-18 (×5): 3 mL via RESPIRATORY_TRACT
  Filled 2024-07-17 (×5): qty 3

## 2024-07-17 MED ORDER — DILTIAZEM HCL ER COATED BEADS 180 MG PO CP24
360.0000 mg | ORAL_CAPSULE | Freq: Every day | ORAL | Status: DC
  Administered 2024-07-17 – 2024-07-24 (×8): 360 mg via ORAL
  Filled 2024-07-17 (×9): qty 2

## 2024-07-17 MED ORDER — METOPROLOL TARTRATE 50 MG PO TABS
75.0000 mg | ORAL_TABLET | Freq: Two times a day (BID) | ORAL | Status: DC
  Administered 2024-07-17 – 2024-07-21 (×8): 75 mg via ORAL
  Filled 2024-07-17 (×8): qty 1

## 2024-07-17 MED ORDER — IPRATROPIUM-ALBUTEROL 0.5-2.5 (3) MG/3ML IN SOLN: 3.0000 mL | Freq: Two times a day (BID) | RESPIRATORY_TRACT | Status: DC

## 2024-07-17 MED ORDER — FUROSEMIDE 40 MG PO TABS
40.0000 mg | ORAL_TABLET | Freq: Every day | ORAL | Status: DC
  Administered 2024-07-17 – 2024-07-18 (×2): 40 mg via ORAL
  Filled 2024-07-17 (×2): qty 1

## 2024-07-17 MED ORDER — AZITHROMYCIN 250 MG PO TABS
500.0000 mg | ORAL_TABLET | Freq: Every day | ORAL | Status: AC
  Administered 2024-07-17 – 2024-07-21 (×5): 500 mg via ORAL
  Filled 2024-07-17 (×5): qty 2

## 2024-07-17 NOTE — Progress Notes (Signed)
 "  Rounding Note   Patient Name: Ryan Hatfield Date of Encounter: 07/17/2024  [facility] Cardiologist: Redell Cave, MD   Subjective Patient reports improvements in dyspnea. He remains on 13 L supplemental O2 by HFNC. Remains in afib with rates well controlled.   Scheduled Meds:  apixaban   5 mg Oral BID   atorvastatin   40 mg Oral Daily   diltiazem   90 mg Oral Q8H   folic acid   1 mg Oral Daily   ipratropium-albuterol   3 mL Nebulization Q6H   levothyroxine   50 mcg Oral Daily   methylPREDNISolone  (SOLU-MEDROL ) injection  80 mg Intravenous Daily   metoprolol  tartrate  37.5 mg Oral Q6H   nicotine   21 mg Transdermal Daily   pantoprazole   40 mg Oral Daily   Continuous Infusions:  diltiazem  (CARDIZEM ) infusion Stopped (07/16/24 1509)   PRN Meds: acetaminophen , albuterol , dextromethorphan-guaiFENesin , nitroGLYCERIN    Vital Signs  Vitals:   07/17/24 0100 07/17/24 0324 07/17/24 0500 07/17/24 0834  BP:   (!) 119/41   Pulse:  94  (!) 152  Resp:  13  19  Temp: 98.1 F (36.7 C)  98.1 F (36.7 C)   TempSrc:   Oral   SpO2:  95%  (!) 89%  Weight:      Height:        Intake/Output Summary (Last 24 hours) at 07/17/2024 0923 Last data filed at 07/17/2024 0100 Gross per 24 hour  Intake 642.33 ml  Output 1420 ml  Net -777.67 ml      07/13/2024    7:04 PM 07/13/2024    6:34 PM 06/22/2024    3:24 PM  Last 3 Weights  Weight (lbs) 308 lb 10.3 oz 302 lb 0.5 oz 303 lb 12.8 oz  Weight (kg) 140 kg 137 kg 137.803 kg      Telemetry Atrial fibrillation, rate 70-100 bpm - Personally Reviewed  Physical Exam  GEN: No acute distress.   Neck: No JVD Cardiac: IRIR, no murmurs, rubs, or gallops.  Respiratory: Reduced breath sounds bilaterally GI: Soft, nontender, non-distended  MS: No edema; No deformity. Neuro:  Nonfocal  Psych: Normal affect   Labs High Sensitivity Troponin:  No results for input(s): TROPONINIHS in the last 720 hours.  Recent Labs  Lab  07/13/24 1835 07/13/24 2212 07/14/24 0448 07/15/24 0439  TRNPT 20* 21* 16 22*       Chemistry Recent Labs  Lab 07/14/24 0448 07/15/24 0439 07/16/24 0533 07/17/24 0506  NA 137 137 139 143  K 4.8 4.1 4.6 5.1  CL 90* 89* 89* 93*  CO2 39* 38* 43* 44*  GLUCOSE 173* 113* 132* 142*  BUN 18 36* 47* 40*  CREATININE 1.14 1.20 1.25* 1.01  CALCIUM  8.7* 8.6* 9.0 9.4  MG 1.9  --   --   --   GFRNONAA >60 >60 >60 >60  ANIONGAP 8 10 8 6     Lipids No results for input(s): CHOL, TRIG, HDL, LABVLDL, LDLCALC, CHOLHDL in the last 168 hours.  Hematology Recent Labs  Lab 07/13/24 1835 07/14/24 0448 07/16/24 0533  WBC 10.4 9.5 17.7*  RBC 4.79 4.76 4.66  HGB 14.3 14.3 13.9  HCT 44.5 44.5 43.7  MCV 92.9 93.5 93.8  MCH 29.9 30.0 29.8  MCHC 32.1 32.1 31.8  RDW 12.8 12.5 12.9  PLT 198 198 208   Thyroid  No results for input(s): TSH, FREET4 in the last 168 hours.  BNP Recent Labs  Lab 07/13/24 1835  PROBNP 527.0*    DDimer  No results for input(s): DDIMER in the last 168 hours.   Cardiac Studies  07/16/2024 2D echo 1. Left ventricular ejection fraction, by estimation, is 60 to 65%. Left  ventricular ejection fraction by PLAX is 63 %. The left ventricle has  normal function. The left ventricle has no regional wall motion  abnormalities. There is moderate concentric  left ventricular hypertrophy. Left ventricular diastolic parameters are  indeterminate.   2. Right ventricular systolic function is normal. The right ventricular  size is normal.   3. The mitral valve is normal in structure. No evidence of mitral valve  regurgitation. No evidence of mitral stenosis.   4. The aortic valve is tricuspid. Aortic valve regurgitation is not  visualized. No aortic stenosis is present.   5. The inferior vena cava is dilated in size with >50% respiratory  variability, suggesting right atrial pressure of 8 mmHg.   Patient Profile   59 y.o. male with a hx of nonobstructive CAD,  chronic HFpEF, atrial flutter s/p TEE cardioversion 01/2023, hypertension, hyperlipidemia, OSA, COPD, gout, and tobacco abuse who was admitted 1/7 with shortness of breath secondary to COPD exacerbation, and who is being seen for the ongoing management of atrial fibrillation with RVR.   Assessment & Plan   Atrial fibrillation with RVR - Originally presenting 1/7 for worsening shortness of breath with initial EKG revealing normal sinus rhythm - Noted to be in atrial flutter on 1/8 with repeat EKG 1/9 showing atrial fibrillation - Echo with preserved LV systolic function - Patient reports intermittent compliance with Eliquis  prior to admission, often taking this only once per day. Amiodarone  subsequently discontinued.  - Remains in atrial fibrillation with rate controlled at 70-100 bpm - Continue diltiazem  90 mg every 8 hours and metoprolol  tartrate 37.5 mg every 6 hours. Can likely consolidate tomorrow if rates remain well controlled.  - Hold Imdur  to allow BP room for rate control. - Transition from Eliquis  to Xarelto  for increased compliance at home. Will be $4 for a 30 day supply per pharmacy.   Acute on chronic HFpEF - proBNP minimally elevated on admission of 527 - Echo with EF 60-65% and indeterminate diastolic parameters - IV diuresed on admission with net -4.6 L output - Patient appears relatively euvolemic on exam although volume status is difficult to assess given body habitus - Renal function near baseline today. Resume PTA oral Lasix  40 mg daily.  - Consider addition of SGLT2 inhibitor prior to discharge   Acute on chronic respiratory failure  COPD exacerbation - In the setting of acute on chronic CHF and COPD exacerbation - Hold diuretics as above - Remains on 12 L/min of O2 on HFNC - Ongoing management per IM   CAD Elevated troponin - Nonobstructive CAD on cath 01/2023 - Denies chest pain - Troponin minimally elevated and flat trending, not consistent with ACS - EKG  without acute ischemic changes - No ischemic evaluation indicated - Resume PTA atorvastatin .  Eliquis  in place of aspirin .   Hypertension - Hold Imdur  to allow BP room for rate control - IV diltiazem  and oral metoprolol  as above   Tobacco use - Recommend cessation  For questions or updates, please contact [facility] Please consult [email or web address] for contact info under       Signed, Lesley LITTIE Maffucci, PA-C  07/17/2024, 9:23 AM    "

## 2024-07-17 NOTE — TOC Progression Note (Signed)
 Transition of Care [facility]) - Progression Note    Patient Details  Name: Ryan Hatfield MRN: [id number] Date of Birth: [birth year]  [facility] CM/SW Contact  Victory Jackquline RAMAN, RN Phone Number: 07/17/2024, 4:30 PM  Clinical Narrative:    RNCM reviewed chart. PT/OT evaluations are pending. RNCM will follow for recommendations. No other TOC needs identified at this time.                     Expected Discharge Plan and Services                                               Social Drivers of Health (SDOH) Interventions SDOH Screenings   Food Insecurity: Unknown (07/15/2024)  Housing: Low Risk (01/30/2023)  Transportation Needs: No Transportation Needs (01/30/2023)  Utilities: Not At Risk (07/16/2024)  Depression (PHQ2-9): Medium Risk (04/02/2023)  Tobacco Use: High Risk (07/13/2024)    Readmission Risk Interventions     No data to display

## 2024-07-17 NOTE — Progress Notes (Signed)
 Pt was on 15L HFNC and his Sp02 was 88-89%  Pt was placed on a HHFNC at 50L and 50%.  Pulse ox probe was changed and placed on pt's left hand no signs of any respiratory distress at this time

## 2024-07-17 NOTE — Plan of Care (Signed)
 I was asked to review CT chest-no indication for bronch..mucus looks insignificant on CT.  changed dounebs q4  added pulmicort  nebs continue steroids and ABX as prescribed keep o2 sats >88%

## 2024-07-17 NOTE — Plan of Care (Signed)
   Problem: Education: Goal: Knowledge of disease or condition will improve Outcome: Progressing Goal: Knowledge of the prescribed therapeutic regimen will improve Outcome: Progressing Goal: Individualized Educational Video(s) Outcome: Progressing   Problem: Activity: Goal: Ability to tolerate increased activity will improve Outcome: Progressing Goal: Will verbalize the importance of balancing activity with adequate rest periods Outcome: Progressing   Problem: Respiratory: Goal: Ability to maintain a clear airway will improve Outcome: Progressing Goal: Levels of oxygenation will improve Outcome: Progressing Goal: Ability to maintain adequate ventilation will improve Outcome: Progressing   Problem: Education: Goal: Knowledge of General Education information will improve Description: Including pain rating scale, medication(s)/side effects and non-pharmacologic comfort measures Outcome: Progressing   Problem: Health Behavior/Discharge Planning: Goal: Ability to manage health-related needs will improve Outcome: Progressing   Problem: Clinical Measurements: Goal: Ability to maintain clinical measurements within normal limits will improve Outcome: Progressing Goal: Will remain free from infection Outcome: Progressing Goal: Diagnostic test results will improve Outcome: Progressing Goal: Respiratory complications will improve Outcome: Progressing Goal: Cardiovascular complication will be avoided Outcome: Progressing   Problem: Activity: Goal: Risk for activity intolerance will decrease Outcome: Progressing   Problem: Nutrition: Goal: Adequate nutrition will be maintained Outcome: Progressing   Problem: Coping: Goal: Level of anxiety will decrease Outcome: Progressing   Problem: Elimination: Goal: Will not experience complications related to bowel motility Outcome: Progressing Goal: Will not experience complications related to urinary retention Outcome: Progressing    Problem: Pain Managment: Goal: General experience of comfort will improve and/or be controlled Outcome: Progressing   Problem: Safety: Goal: Ability to remain free from injury will improve Outcome: Progressing   Problem: Skin Integrity: Goal: Risk for impaired skin integrity will decrease Outcome: Progressing

## 2024-07-17 NOTE — Progress Notes (Signed)
 " PROGRESS NOTE    Ryan Hatfield   FMW:[id number] DOB: 1965-07-09  DOA: 07/13/2024 Date of Service: 07/17/2024 which is hospital day 4  PCP: Corwin Antu, [place] course / significant events:   Ryan Hatfield is a 59 y.o. male with medical history significant of dCHF, HTN, HLD, COPD on 2L oxygen , hypothyroidism, GERD, depression with anxiety, A-fib on Eliquis , morbid obesity, smoker, alcohol abuse in remission, medication noncompliance, who presents with SOB.   HPI: Patient states that he is not taking most of his medications including Eliquis  for a while due to insurance issue. (He is not able to recall some medications he's not been taking, reports not on inhalers recently, not sure about all cradiac meds but reports he's been on his eliquis ) He developed SOB in the past 2 days, which has been progressively worsening. He has cough with clear mucus production, no chest pain, fever or chills. Patient is normally using 2 L oxygen  at baseline, but was found to have oxygen  desaturation to 76% on home level 2 L oxygen , initially started 6-8 L oxygen , still has respiratory distress and difficulty speaking in full sentence, then started BiPAP with improvement.  BiPAP is weaned off now, currently on 6 L oxygen  with 100% of saturation.   01/07:  to ED. BNP 527, GFR> 60, troponin 20 --> 21, negative PCR for COVID, flu and RSV. Temperature normal, blood pressure 158/62, heart rate 101 --> 84, RR 21.  Chest x-ray showed cardiomegaly and bibasilar atelectasis.  Patient is admitted to PCU as inpatient. Needing HFNC up to 15 L/min 01/08: BiPAP overnight w/ improvement in Afib rate. Off BiPAP today per patient request, remains on high flow [place] 15 L/min. Continue diuresis. UOP yesterday -1400 mL 01/09: HFNC 11 L/min, no BiPAP overnight. UOP yesterday -1500 mL. Back into Afib RVR, dilt bolus ineffective, starting drip, Echo ordered. D/w cardiology, plan to see him tomorrow but they are aware of his  case in case decompensation / emergent cardioversion needed 01/10: Cardiology following --> dc amiodarone  (pt now notes spotty compliance w/ home Eliquis ), continue diltiazem  infusion, changed to metoprolol  tartrate 37.5 mg q6h, holding lasix  for today. Echo EF 60-65 mod LVH indeterminate diastolic 01/11: consolidate diltiazem  to 360mg  daily and metoprolol  75mg  bid. Still on high low [place] 13L/[place] CT chest, no PE, (+)concern pneumonia so started abx, later up to [facility] 50L/min. Will ask pulmonary to review CT, question may need bronch     Consultants:  Cardiology   Procedures/Surgeries: none      ASSESSMENT & PLAN:   Acute on chronic respiratory failure with hypoxia Likely due to combination of COPD and CHF exacerbation.   O2 titrate as needed / wean as able - back on BiPAP this morning  Treat underlying cause(s) as noted  COPD with acute exacerbation  Wheezing on exam  Bronchodilators and prn Mucinex  Solu-Medrol  80 mg IV daily after given 125 mg of Solu-Medrol  Incentive spirometry check RVP Follow up sputum culture Low threshold for pulmonary consult    Community acquired PNA Ceftriaxone  + azithromycin  COPD as above   Acute on chronic diastolic CHF (congestive heart failure)  Patient has leg edema, indicating possible CHF exacerbation.  His BNP is 527 which is likely falsely low due to morbid obesity. Last echo 2024 - EF 60-65%, grade 1 diast df  07/16/2024 Echo EF 60-65 mod LVH indeterminate diastolic appears relatively euvolemic on exam although volume status is difficult to assess given body habitus  Lasix  40  mg IV bid --> held for now, anticipate resume po diuretic tomorrow  Consider addition of SGLT2 inhibitor prior to discharge echo pending Daily weights strict I/O's Low salt diet Fluid restriction  Afib RVR Cardiology following  Eliquis  - pt reports compliance at home however later admits to taking this intermittently / daily rather than consistent bid dosing  --> transition to Xarelto  to help compliance  diltiazem  to 360mg  daily  Metoprolol  to 75mg  bid.  holding amiodarone  given uncertain compliance w/ Eliquis  Metoprolol  tartrate 37.5 mg every 6 hours  Hold Imdur  to allow BP room for rate control.  Telemetry Cardiology following   CAD - Nonobstructive CAD on cath 01/2023  Elevation troponin Likely demand ischemia Repeat as needed EKG as needed chest pain  Atorvastatin , Xarelto    Essential hypertension IV hydralazine  as needed Imdur  held for now Metoprolol  as above  diltiazem  as above    Leukocytosis Steroid effect Monitor CBC periodically / as needed  Hypochloremia / Metabolic Alkalosis  Likely mixed respiratory component w/ hypoxia/hyperventilation but CO2 retention w/ habitus/OSA, and would expect some Cl wasting w/ lasix  Monitor BMP  Reduced diuresis   Acquired hypothyroidism Synthroid    Tobacco abuse Nicotine  patch Did counseling about importance of quitting smoking     Class 3 obesity based on BMI: Body mass index is 45.58 kg/m.SABRA Significantly low or high BMI is associated with higher medical risk.  Underweight - under 18  overweight - 25 to 29 obese - 30 or more Class 1 obesity: BMI of 30.0 to 34 Class 2 obesity: BMI of 35.0 to 39 Class 3 obesity: BMI of 40.0 to 49 Super Morbid Obesity: BMI 50-59 Super-super Morbid Obesity: BMI 60+ Healthy nutrition and physical activity advised as adjunct to other disease management and risk reduction treatments    DVT prophylaxis: eliquis  IV fluids: no continuous IV fluids  Nutrition: cardaic Central lines / other devices: none  Code Status: FULL CODE ACP documentation reviewed:  none on file in [facility]  TOC needs: TBD Medical barriers to dispo: O2 requirement - HFNC on progressive unit. Afib RVR Expected medical readiness for discharge several days.              Subjective / Brief ROS:  Patient reports SOB is fine despite on higher flow O2 at this  time Denies CP / palpitations  Pain controlled.  Denies new weakness.  Tolerating diet.  Reports no concerns w/ urination/defecation.   Family Communication: family is onphone in pt room    Objective Findings:  Vitals:   07/17/24 0834 07/17/24 1045 07/17/24 1246 07/17/24 1250  BP:   133/72   Pulse: (!) 152   94  Resp: 19   12  Temp:   97.9 F (36.6 C)   TempSrc:   Oral   SpO2: (!) 89% 97%  97%  Weight:      Height:        Intake/Output Summary (Last 24 hours) at 07/17/2024 1313 Last data filed at 07/17/2024 1035 Gross per 24 hour  Intake 642.33 ml  Output 1570 ml  Net -927.67 ml   Filed Weights   07/13/24 1834 07/13/24 1904  Weight: (!) 137 kg (!) 140 kg    Examination:  Physical Exam Constitutional:      General: He is not in acute distress.    Appearance: He is obese.  Cardiovascular:     Rate and Rhythm: Tachycardia present. Rhythm irregular.  Pulmonary:     Breath sounds: Decreased breath sounds and wheezing present.  No rales (minimal/atelectasis).  Musculoskeletal:     Right lower leg: Edema (trace) present.     Left lower leg: Edema (trace) present.  Neurological:     Mental Status: He is alert and oriented to person, place, and time.  Psychiatric:        Mood and Affect: Mood normal.        Behavior: Behavior normal.          Scheduled Medications:   atorvastatin   40 mg Oral Daily   azithromycin   500 mg Oral Daily   diltiazem   360 mg Oral Daily   folic acid   1 mg Oral Daily   furosemide   40 mg Oral Daily   ipratropium-albuterol   3 mL Nebulization BID   levothyroxine   50 mcg Oral Daily   methylPREDNISolone  (SOLU-MEDROL ) injection  80 mg Intravenous Daily   metoprolol  tartrate  75 mg Oral BID   nicotine   21 mg Transdermal Daily   pantoprazole   40 mg Oral Daily   rivaroxaban   20 mg Oral Q supper    Continuous Infusions:  cefTRIAXone  (ROCEPHIN )  IV 2 g (07/17/24 1258)    PRN Medications:  acetaminophen , albuterol ,  dextromethorphan-guaiFENesin , nitroGLYCERIN   Antimicrobials from admission:  Anti-infectives (From admission, onward)    Start     Dose/Rate Route Frequency Ordered Stop   07/17/24 1300  cefTRIAXone  (ROCEPHIN ) 2 g in sodium chloride  0.9 % 100 mL IVPB        2 g 200 mL/hr over 30 Minutes Intravenous Every 24 hours 07/17/24 1146     07/17/24 1300  azithromycin  (ZITHROMAX ) tablet 500 mg        500 mg Oral Daily 07/17/24 1146             Data Reviewed:  I have personally reviewed the following...  CBC: Recent Labs  Lab 07/13/24 1835 07/14/24 0448 07/16/24 0533  WBC 10.4 9.5 17.7*  NEUTROABS 8.9*  --   --   HGB 14.3 14.3 13.9  HCT 44.5 44.5 43.7  MCV 92.9 93.5 93.8  PLT 198 198 208   Basic Metabolic Panel: Recent Labs  Lab 07/13/24 1835 07/14/24 0448 07/15/24 0439 07/16/24 0533 07/17/24 0506  NA 138 137 137 139 143  K 4.4 4.8 4.1 4.6 5.1  CL 91* 90* 89* 89* 93*  CO2 37* 39* 38* 43* 44*  GLUCOSE 114* 173* 113* 132* 142*  BUN 12 18 36* 47* 40*  CREATININE 1.04 1.14 1.20 1.25* 1.01  CALCIUM  9.0 8.7* 8.6* 9.0 9.4  MG  --  1.9  --   --   --    GFR: Estimated Creatinine Clearance: 108.3 mL/min (by C-G formula based on SCr of 1.01 mg/dL). Liver Function Tests: No results for input(s): AST, ALT, ALKPHOS, BILITOT, PROT, ALBUMIN in the last 168 hours. No results for input(s): LIPASE, AMYLASE in the last 168 hours. No results for input(s): AMMONIA in the last 168 hours. Coagulation Profile: No results for input(s): INR, PROTIME in the last 168 hours. Cardiac Enzymes: No results for input(s): CKTOTAL, CKMB, CKMBINDEX, TROPONINI in the last 168 hours. BNP (last 3 results) Recent Labs    07/13/24 1835  PROBNP 527.0*   HbA1C: No results for input(s): HGBA1C in the last 72 hours. CBG: Recent Labs  Lab 07/16/24 2118 07/17/24 0949 07/17/24 1007  GLUCAP 214* 185* 127*   Lipid Profile: No results for input(s): CHOL, HDL,  LDLCALC, TRIG, CHOLHDL, LDLDIRECT in the last 72 hours. Thyroid  Function Tests: No results for input(s): TSH,  T4TOTAL, FREET4, T3FREE, THYROIDAB in the last 72 hours. Anemia Panel: No results for input(s): VITAMINB12, FOLATE, FERRITIN, TIBC, IRON, RETICCTPCT in the last 72 hours. Most Recent Urinalysis On File:     Component Value Date/Time   COLORURINE AMBER (A) 11/25/2017 1618   APPEARANCEUR CLEAR (A) 11/25/2017 1618   LABSPEC 1.028 11/25/2017 1618   PHURINE 5.0 11/25/2017 1618   GLUCOSEU NEGATIVE 11/25/2017 1618   HGBUR SMALL (A) 11/25/2017 1618   BILIRUBINUR negative 10/21/2022 1103   KETONESUR NEGATIVE 11/25/2017 1618   PROTEINUR Positive (A) 10/21/2022 1103   PROTEINUR 30 (A) 11/25/2017 1618   UROBILINOGEN 0.2 10/21/2022 1103   NITRITE negative 10/21/2022 1103   NITRITE NEGATIVE 11/25/2017 1618   LEUKOCYTESUR Moderate (2+) (A) 10/21/2022 1103   Sepsis Labs: @LABRCNTIP (procalcitonin:4,lacticidven:4) Microbiology: Recent Results (from the past 240 hours)  Resp panel by RT-PCR (RSV, Flu A&B, Covid) Anterior Nasal Swab     Status: None   Collection Time: 07/13/24  6:47 PM   Specimen: Anterior Nasal Swab  Result Value Ref Range Status   SARS Coronavirus 2 by RT PCR NEGATIVE NEGATIVE Final    Comment: (NOTE) SARS-CoV-2 target nucleic acids are NOT DETECTED.  The SARS-CoV-2 RNA is generally detectable in upper respiratory specimens during the acute phase of infection. The lowest concentration of SARS-CoV-2 viral copies this assay can detect is 138 copies/mL. A negative result does not preclude SARS-Cov-2 infection and should not be used as the sole basis for treatment or other patient management decisions. A negative result may occur with  improper specimen collection/handling, submission of specimen other than nasopharyngeal swab, presence of viral mutation(s) within the areas targeted by this assay, and inadequate number of viral copies(<138  copies/mL). A negative result must be combined with clinical observations, patient history, and epidemiological information. The expected result is Negative.  Fact Sheet for Patients:  bloggercourse.com  Fact Sheet for Healthcare Providers:  seriousbroker.it  This test is no t yet approved or cleared by the [place]  FDA and  has been authorized for detection and/or diagnosis of SARS-CoV-2 by FDA under an Emergency Use Authorization (EUA). This EUA will remain  in effect (meaning this test can be used) for the duration of the COVID-19 declaration under Section 564(b)(1) of the Act, 21 U.S.C.section 360bbb-3(b)(1), unless the authorization is terminated  or revoked sooner.       Influenza A by PCR NEGATIVE NEGATIVE Final   Influenza B by PCR NEGATIVE NEGATIVE Final    Comment: (NOTE) The Xpert Xpress SARS-CoV-2/FLU/RSV plus assay is intended as an aid in the diagnosis of influenza from Nasopharyngeal swab specimens and should not be used as a sole basis for treatment. Nasal washings and aspirates are unacceptable for Xpert Xpress SARS-CoV-2/FLU/RSV testing.  Fact Sheet for Patients: bloggercourse.com  Fact Sheet for Healthcare Providers: seriousbroker.it  This test is not yet approved or cleared by the [place]  FDA and has been authorized for detection and/or diagnosis of SARS-CoV-2 by FDA under an Emergency Use Authorization (EUA). This EUA will remain in effect (meaning this test can be used) for the duration of the COVID-19 declaration under Section 564(b)(1) of the Act, 21 U.S.C. section 360bbb-3(b)(1), unless the authorization is terminated or revoked.     Resp Syncytial Virus by PCR NEGATIVE NEGATIVE Final    Comment: (NOTE) Fact Sheet for Patients: bloggercourse.com  Fact Sheet for Healthcare  Providers: seriousbroker.it  This test is not yet approved or cleared by the [place]  FDA and has been authorized for detection  and/or diagnosis of SARS-CoV-2 by FDA under an Emergency Use Authorization (EUA). This EUA will remain in effect (meaning this test can be used) for the duration of the COVID-19 declaration under Section 564(b)(1) of the Act, 21 U.S.C. section 360bbb-3(b)(1), unless the authorization is terminated or revoked.  Performed at [facility], [address] [place], [place] [address]   Respiratory (~20 pathogens) panel by PCR     Status: None   Collection Time: 07/13/24 10:12 PM   Specimen: Nasopharyngeal Swab; Respiratory  Result Value Ref Range Status   Adenovirus NOT DETECTED NOT DETECTED Final   Coronavirus 229E NOT DETECTED NOT DETECTED Final    Comment: (NOTE) The Coronavirus on the Respiratory Panel, DOES NOT test for the novel  Coronavirus (2019 nCoV)    Coronavirus HKU1 NOT DETECTED NOT DETECTED Final   Coronavirus NL63 NOT DETECTED NOT DETECTED Final   Coronavirus OC43 NOT DETECTED NOT DETECTED Final   Metapneumovirus NOT DETECTED NOT DETECTED Final   Rhinovirus / Enterovirus NOT DETECTED NOT DETECTED Final   Influenza A NOT DETECTED NOT DETECTED Final   Influenza B NOT DETECTED NOT DETECTED Final   Parainfluenza Virus 1 NOT DETECTED NOT DETECTED Final   Parainfluenza Virus 2 NOT DETECTED NOT DETECTED Final   Parainfluenza Virus 3 NOT DETECTED NOT DETECTED Final   Parainfluenza Virus 4 NOT DETECTED NOT DETECTED Final   Respiratory Syncytial Virus NOT DETECTED NOT DETECTED Final   Bordetella pertussis NOT DETECTED NOT DETECTED Final   Bordetella Parapertussis NOT DETECTED NOT DETECTED Final   Chlamydophila pneumoniae NOT DETECTED NOT DETECTED Final   Mycoplasma pneumoniae NOT DETECTED NOT DETECTED Final    Comment: Performed at [facility], [address], [place], [place] [address]      Radiology  Studies last 3 days: CT Angio Chest Pulmonary Embolism (PE) W or WO Contrast Result Date: 07/17/2024 EXAM: CTA of the Chest with and without contrast for PE 07/17/2024 10:26:40 AM TECHNIQUE: CTA of the chest was performed after the administration of intravenous contrast. Multiplanar reformatted images are provided for review. MIP images are provided for review. Automated exposure control, iterative reconstruction, and/or weight based adjustment of the mA/kV was utilized to reduce the radiation dose to as low as reasonably achievable. CONTRAST: 75 mL iohexol  (OMNIPAQUE ) 350 MG/ML injection. COMPARISON: CTA chest 01/29/2023. CLINICAL HISTORY: 59 year old male with persistent hypoxia and inconsistent blood thinner use at home. FINDINGS: PULMONARY ARTERIES: Suboptimal but adequate pulmonary artery contrast timing. No convincing pulmonary artery filling defect. Main pulmonary artery is normal in caliber. Some pulmonary artery detail limited by motion, especially in the Right upper lobe. No convincing pulmonary artery filling defect. MEDIASTINUM: The heart and pericardium demonstrate no acute abnormality. Calcified coronary artery and aortic atherosclerosis. There is no acute abnormality of the thoracic aorta. LYMPH NODES: Mediastinal lymph nodes are stable since 2024, likely reactive. LUNGS AND PLEURA: Mild respiratory motion. Sabre sheath trachea configuration suggesting chronic obstructive pulmonary disease (series 8 image 33). Atelectatic changes to the airways and some retained secretions also suspected in the Right mainstem bronchus. Superimposed generalized central bronchial wall thickening bilaterally. Tree in bud nodular opacity scattered in the Right upper lobe, throughout the Right lower lobe, and superimposed confluent peribronchial opacity in the Lingula, Posterior basal segment of the Right lower lobe, and Medial segment of the Right middle lobe. This more resembles developing consolidation than  atelectasis. No pleural effusion. No pneumothorax. UPPER ABDOMEN: Similar mild contrast reflux from the right atrium into the hepatic IVC  and hepatic veins. Negative visible other non-contrast upper abdominal viscera. SOFT TISSUES AND BONES: No acute bone or soft tissue abnormality. IMPRESSION: 1. No convincing pulmonary embolus; evaluation limited by motion and suboptimal contrast timing. 2. Evidence of COPD with superimposed generalized bronchitis, and bilateral respiratory infection with multifocal early consolidation. No pleural effusion. Electronically signed by: Helayne Hurst MD MD 07/17/2024 10:55 AM EST RP Workstation: [id number]   ECHOCARDIOGRAM COMPLETE Result Date: 07/16/2024    ECHOCARDIOGRAM REPORT   Patient Name:   Ryan Hatfield [facility] Date of Exam: 07/16/2024 Medical Rec #:  [id number]         Height:       69.0 in Accession #:    [id number]        Weight:       308.6 lb Date of Birth:  May 19, 1966         BSA:          2.484 m Patient Age:    58 years          BP:           105/61 mmHg Patient Gender: M                 HR:           84 bpm. Exam Location:  [facility] Procedure: 2D Echo, Cardiac Doppler, Color Doppler and Intracardiac            Opacification Agent (Both Spectral and Color Flow Doppler were            utilized during procedure). Indications:     CHF I50.31  History:         Patient has prior history of Echocardiogram examinations, most                  recent 05/05/2023.  Sonographer:     Thedora Louder RDCS, FASE Referring Phys:  [id number] LANETA BLUNT Diagnosing Phys: Annabella Scarce MD  Sonographer Comments: Technically difficult study due to poor echo windows, suboptimal apical window and patient is obese. Image acquisition challenging due to patient body habitus and Image acquisition challenging due to respiratory motion. The patient was on 12L of oxygen  at the time of this study. IMPRESSIONS  1. Left ventricular ejection fraction, by estimation, is 60 to 65%. Left ventricular  ejection fraction by PLAX is 63 %. The left ventricle has normal function. The left ventricle has no regional wall motion abnormalities. There is moderate concentric left ventricular hypertrophy. Left ventricular diastolic parameters are indeterminate.  2. Right ventricular systolic function is normal. The right ventricular size is normal.  3. The mitral valve is normal in structure. No evidence of mitral valve regurgitation. No evidence of mitral stenosis.  4. The aortic valve is tricuspid. Aortic valve regurgitation is not visualized. No aortic stenosis is present.  5. The inferior vena cava is dilated in size with >50% respiratory variability, suggesting right atrial pressure of 8 mmHg. FINDINGS  Left Ventricle: Left ventricular ejection fraction, by estimation, is 60 to 65%. Left ventricular ejection fraction by PLAX is 63 %. The left ventricle has normal function. The left ventricle has no regional wall motion abnormalities. Definity  contrast agent was given IV to delineate the left ventricular endocardial borders. The left ventricular internal cavity size was normal in size. There is moderate concentric left ventricular hypertrophy. Left ventricular diastolic function could not be evaluated due to atrial fibrillation. Left ventricular diastolic parameters are indeterminate. Normal left ventricular filling pressure. Right Ventricle: The  right ventricular size is normal. No increase in right ventricular wall thickness. Right ventricular systolic function is normal. Left Atrium: Left atrial size was normal in size. Right Atrium: Right atrial size was normal in size. Pericardium: There is no evidence of pericardial effusion. Mitral Valve: The mitral valve is normal in structure. Mild mitral annular calcification. No evidence of mitral valve regurgitation. No evidence of mitral valve stenosis. Tricuspid Valve: The tricuspid valve is normal in structure. Tricuspid valve regurgitation is not demonstrated. No evidence  of tricuspid stenosis. Aortic Valve: The aortic valve is tricuspid. Aortic valve regurgitation is not visualized. No aortic stenosis is present. Aortic valve peak gradient measures 0.9 mmHg. Pulmonic Valve: The pulmonic valve was normal in structure. Pulmonic valve regurgitation is not visualized. No evidence of pulmonic stenosis. Aorta: The aortic root is normal in size and structure. Venous: The inferior vena cava is dilated in size with greater than 50% respiratory variability, suggesting right atrial pressure of 8 mmHg. IAS/Shunts: No atrial level shunt detected by color flow Doppler.  LEFT VENTRICLE PLAX 2D LV EF:         Left            Diastology                ventricular     LV e' medial:    11.40 cm/s                ejection        LV E/e' medial:  9.4                fraction by     LV e' lateral:   9.36 cm/s                PLAX is 63      LV E/e' lateral: 11.4                %. LVIDd:         4.45 cm LVIDs:         2.95 cm LV PW:         1.40 cm LV IVS:        1.30 cm LVOT diam:     2.40 cm LVOT Area:     4.52 cm  RIGHT VENTRICLE RV Basal diam:  3.00 cm RV S prime:     11.40 cm/s TAPSE (M-mode): 1.8 cm LEFT ATRIUM             Index        RIGHT ATRIUM           Index LA diam:        3.90 cm 1.57 cm/m   RA Area:     19.80 cm LA Vol (A2C):   49.3 ml 19.84 ml/m  RA Volume:   54.30 ml  21.86 ml/m LA Vol (A4C):   34.9 ml 14.05 ml/m LA Biplane Vol: 45.6 ml 18.35 ml/m  AORTIC VALVE             PULMONIC VALVE AV Vmax:      47.10 cm/s PV Vmax:        1.14 m/s AV Peak Grad: 0.9 mmHg   PV Peak grad:   5.2 mmHg                          RVOT Peak grad: 5 mmHg AORTA Ao Root diam: 3.20 cm Ao Asc diam:  3.10  cm MITRAL VALVE MV Area (PHT): 3.30 cm     SHUNTS MV Decel Time: 230 msec     Systemic Diam: 2.40 cm MV E velocity: 107.00 cm/s Annabella Scarce MD Electronically signed by Annabella Scarce MD Signature Date/Time: 07/16/2024/1:12:06 PM    Final    DG Chest Portable 1 View Result Date: 07/13/2024 EXAM: 1  VIEW(S) XRAY OF THE CHEST 02/24/2023 06:54:20 PM COMPARISON: None available. CLINICAL HISTORY: sob FINDINGS: LUNGS AND PLEURA: Bibasilar atelectasis. No focal pulmonary opacity. No pleural effusion. No pneumothorax. HEART AND MEDIASTINUM: Heart is mildly enlarged. BONES AND SOFT TISSUES: No acute osseous abnormality. IMPRESSION: 1. Mild cardiomegaly. 2. Bibasilar atelectasis. Electronically signed by: Greig Pique MD MD 07/13/2024 06:56 PM EST RP Workstation: [id number]         Laneta Blunt, DO [facility] 07/17/2024, 1:13 PM    Dictation software may have been used to generate the above note. Typos may occur and escape review in typed/dictated notes. Please contact Dr Blunt directly for clarity if needed.  Staff may message me via secure chat in Epic  but this may not receive an immediate response,  please page me for urgent matters!  If 7PM-7AM, please contact night coverage [email or web address]       "

## 2024-07-18 DIAGNOSIS — I4891 Unspecified atrial fibrillation: Secondary | ICD-10-CM

## 2024-07-18 DIAGNOSIS — J9621 Acute and chronic respiratory failure with hypoxia: Secondary | ICD-10-CM | POA: Diagnosis not present

## 2024-07-18 DIAGNOSIS — I482 Chronic atrial fibrillation, unspecified: Secondary | ICD-10-CM | POA: Diagnosis not present

## 2024-07-18 DIAGNOSIS — I5033 Acute on chronic diastolic (congestive) heart failure: Secondary | ICD-10-CM | POA: Diagnosis not present

## 2024-07-18 LAB — BASIC METABOLIC PANEL WITH GFR
BUN: 34 mg/dL — ABNORMAL HIGH (ref 6–20)
BUN: 34 mg/dL — ABNORMAL HIGH (ref 6–20)
CO2: >45 mmol/L — ABNORMAL HIGH (ref 22–32)
CO2: >45 mmol/L — ABNORMAL HIGH (ref 22–32)
Calcium: 9.3 mg/dL (ref 8.9–10.3)
Calcium: 9.3 mg/dL (ref 8.9–10.3)
Chloride: 92 mmol/L — ABNORMAL LOW (ref 98–111)
Chloride: 91 mmol/L — ABNORMAL LOW (ref 98–111)
Creatinine, Ser: 1.05 mg/dL (ref 0.61–1.24)
Creatinine, Ser: 1.03 mg/dL (ref 0.61–1.24)
GFR, Estimated: >60 mL/min
GFR, Estimated: >60 mL/min
Glucose, Bld: 157 mg/dL — ABNORMAL HIGH (ref 70–99)
Glucose, Bld: 178 mg/dL — ABNORMAL HIGH (ref 70–99)
Potassium: 5.3 mmol/L — ABNORMAL HIGH (ref 3.5–5.1)
Potassium: 5.2 mmol/L — ABNORMAL HIGH (ref 3.5–5.1)
Sodium: 144 mmol/L (ref 135–145)
Sodium: 143 mmol/L (ref 135–145)

## 2024-07-18 LAB — CBC
HCT: 49.2 % (ref 39.0–52.0)
Hemoglobin: 14.4 g/dL (ref 13.0–17.0)
MCH: 29 pg (ref 26.0–34.0)
MCHC: 29.3 g/dL — ABNORMAL LOW (ref 30.0–36.0)
MCV: 99 fL (ref 80.0–100.0)
Platelets: 265 K/uL (ref 150–400)
RBC: 4.97 MIL/uL (ref 4.22–5.81)
RDW: 12.5 % (ref 11.5–15.5)
WBC: 18.9 K/uL — ABNORMAL HIGH (ref 4.0–10.5)
nRBC: 0 % (ref 0.0–0.2)

## 2024-07-18 LAB — BLOOD GAS, ARTERIAL
Acid-Base Excess: 30.6 mmol/L — ABNORMAL HIGH (ref 0.0–2.0)
Bicarbonate: 61.2 mmol/L — ABNORMAL HIGH (ref 20.0–28.0)
FIO2: 50 %
O2 Content: 50 L/min
O2 Saturation: 93.9 %
Patient temperature: 37
pCO2 arterial: 88 mmHg (ref 32–48)
pH, Arterial: 7.45 (ref 7.35–7.45)
pO2, Arterial: 63 mmHg — ABNORMAL LOW (ref 83–108)

## 2024-07-18 MED ORDER — MAGNESIUM HYDROXIDE 400 MG/5ML PO SUSP: 15.0000 mL | Freq: Every day | ORAL | Status: DC | PRN

## 2024-07-18 MED ORDER — POLYETHYLENE GLYCOL 3350 17 G PO PACK
17.0000 g | PACK | Freq: Every day | ORAL | Status: DC
  Administered 2024-07-18 – 2024-07-23 (×4): 17 g via ORAL
  Filled 2024-07-18 (×5): qty 1

## 2024-07-18 MED ORDER — SENNOSIDES-DOCUSATE SODIUM 8.6-50 MG PO TABS: 2.0000 | ORAL_TABLET | Freq: Every evening | ORAL | Status: DC | PRN

## 2024-07-18 MED ORDER — IPRATROPIUM-ALBUTEROL 0.5-2.5 (3) MG/3ML IN SOLN
3.0000 mL | Freq: Two times a day (BID) | RESPIRATORY_TRACT | Status: DC
  Administered 2024-07-18 – 2024-07-19 (×2): 3 mL via RESPIRATORY_TRACT
  Filled 2024-07-18 (×2): qty 3

## 2024-07-18 MED ORDER — FLEET ENEMA RE ENEM: 1.0000 | ENEMA | Freq: Every day | RECTAL | Status: DC | PRN

## 2024-07-18 MED ORDER — SENNOSIDES-DOCUSATE SODIUM 8.6-50 MG PO TABS
2.0000 | ORAL_TABLET | Freq: Once | ORAL | Status: AC
  Administered 2024-07-18: 2 via ORAL
  Filled 2024-07-18: qty 2

## 2024-07-18 NOTE — Progress Notes (Signed)
 Spoke with pt's RN about getting pt out of bed and up in chair this shift and about starting pt on a IS

## 2024-07-18 NOTE — Progress Notes (Signed)
 Respiratory protocol completed . Per assessment Duonebs changed to BID. X ray on 1-7 shows Bibasilar atelectasis. No focal pulmonary opacity. No pleural effusion. No pneumothorax.  Pt remains on HHFC at 50L and 50%.  Pt would benefit from an IS to help improve his oxygenation.  Getting out of the bed up in a chair and/or walking would improve oxygenation.  Pt is in no respiratory distress

## 2024-07-18 NOTE — Progress Notes (Addendum)
 " PROGRESS NOTE    Ryan Hatfield   FMW:[id number] DOB: [birth year]  DOA: 07/13/2024 Date of Service: 07/19/2024 which is hospital day 6  PCP: Corwin Antu, [facility] course / significant events:   Ryan Hatfield is a 59 y.o. male with medical history significant of dCHF, HTN, HLD, COPD on 2L oxygen , hypothyroidism, GERD, depression with anxiety, A-fib on Eliquis , morbid obesity, smoker, alcohol abuse in remission, medication noncompliance, who presents with SOB.   HPI: Patient states that he is not taking most of his medications including Eliquis  for a while due to insurance issue. (He is not able to recall some medications he's not been taking, reports not on inhalers recently, not sure about all cradiac meds but reports he's been on his eliquis ) He developed SOB in the past 2 days, which has been progressively worsening. He has cough with clear mucus production, no chest pain, fever or chills. Patient is normally using 2 L oxygen  at baseline, but was found to have oxygen  desaturation to 76% on home level 2 L oxygen , initially started 6-8 L oxygen , still has respiratory distress and difficulty speaking in full sentence, then started BiPAP with improvement.  BiPAP is weaned off now, currently on 6 L oxygen  with 100% of saturation.   01/07:  to ED. BNP 527, GFR> 60, troponin 20 --> 21, negative PCR for COVID, flu and RSV. Temperature normal, blood pressure 158/62, heart rate 101 --> 84, RR 21.  Chest x-ray showed cardiomegaly and bibasilar atelectasis.  Patient is admitted to PCU as inpatient. Needing HFNC up to 15 L/min 01/08: BiPAP overnight w/ improvement in Afib rate. Off BiPAP today per patient request, remains on high flow [place] 15 L/min. Continue diuresis. UOP yesterday -1400 mL 01/09: HFNC 11 L/min, no BiPAP overnight. UOP yesterday -1500 mL. Back into Afib RVR, dilt bolus ineffective, starting drip, Echo ordered. D/w cardiology, plan to see him tomorrow but they are aware of his  case in case decompensation / emergent cardioversion needed 01/10: Cardiology following --> dc amiodarone  (pt now notes spotty compliance w/ home Eliquis ), continue diltiazem  infusion, changed to metoprolol  tartrate 37.5 mg q6h, holding lasix  for today. Echo EF 60-65 mod LVH indeterminate diastolic 01/11: consolidate diltiazem  to 360mg  daily and metoprolol  75mg  bid. Still on high low [place] 13L/[place] CT chest, no PE, (+)concern pneumonia so started abx, later up to [facility] 50L/min. Will ask pulmonary to review CT, question may need bronch - does not - duonebs q4h, add pulmicort  01/12: hypercarbic, on BiPAP overnight and  back on HHFNC through the day, repeating BMP this afternoon. CO2 narcosis mild confusion  01/13: more confused/agitated overnight, unresponsive this morning, transferring to ICU anticipate intubation pr potentially needs precedex + BiPAP     Consultants:  Cardiology   Procedures/Surgeries: none      ASSESSMENT & PLAN:   Acute on chronic respiratory failure with hypoxia Likely due to combination of COPD and CHF exacerbation.  Primarily COPD  Acute hypoxic/hypercarbic respiratory failure   O2 titrate as needed / wean as able - back on BiPAP this evening and need to stay on this through the night  Treat underlying cause(s) as noted  COPD with acute exacerbation  Wheezing on exam  Duoneb q4h Budesonide  nebs  prn Mucinex  Solu-Medrol  80 mg IV daily after given 125 mg of Solu-Medrol  Incentive spirometry Follow up sputum culture Low threshold for pulmonary consult but appreciate Dr Isaiah reviwing imaging and confirming no need for bronch or other concerns other  than noted above   Community acquired PNA Ceftriaxone  + azithromycin   OSA Suspect may need BiPAP at bedtime long-term  Acute on chronic diastolic CHF (congestive heart failure)  Patient presenting w/ leg edema, indicating possible CHF exacerbation.  His BNP was 527 which is likely falsely low due to morbid  obesity. Last echo 2024 - EF 60-65%, grade 1 diast df  07/16/2024 Echo EF 60-65 mod LVH indeterminate diastolic appears relatively euvolemic on exam although volume status is difficult to assess given body habitus  Lasix  40 mg po daily  Consider addition of SGLT2 inhibitor prior to discharge echo pending Daily weights strict I/O's Low salt diet Fluid restriction  Afib RVR Cardiology following  Eliquis  - pt reports compliance at home however later admits to taking this intermittently / daily rather than consistent bid dosing --> transition to Xarelto  to help compliance  diltiazem  to 360mg  daily  Metoprolol  to 75mg  bid.  holding amiodarone  given uncertain compliance w/ Eliquis  Metoprolol  tartrate 37.5 mg every 6 hours  Hold Imdur  to allow BP room for rate control.  Telemetry Cardiology following   CAD - Nonobstructive CAD on cath 01/2023  Elevation troponin Likely demand ischemia Repeat as needed EKG as needed chest pain  Atorvastatin , Xarelto    Essential hypertension IV hydralazine  as needed Imdur  held for now Metoprolol  as above  diltiazem  as above    Leukocytosis Steroid effect Monitor CBC periodically / as needed  Acquired hypothyroidism Synthroid    Tobacco abuse Nicotine  patch Did counseling about importance of quitting smoking     Class 3 obesity based on BMI: Body mass index is 45.58 kg/m.SABRA Significantly low or high BMI is associated with higher medical risk.  Underweight - under 18  overweight - 25 to 29 obese - 30 or more Class 1 obesity: BMI of 30.0 to 34 Class 2 obesity: BMI of 35.0 to 39 Class 3 obesity: BMI of 40.0 to 49 Super Morbid Obesity: BMI 50-59 Super-super Morbid Obesity: BMI 60+ Healthy nutrition and physical activity advised as adjunct to other disease management and risk reduction treatments    DVT prophylaxis: eliquis  IV fluids: no continuous IV fluids  Nutrition: cardaic Central lines / other devices: none  Code Status: FULL  CODE ACP documentation reviewed:  none on file in [facility]  TOC needs: TBD Medical barriers to dispo: O2 requirement - HFNC on progressive unit. Afib RVR Expected medical readiness for discharge several days.              Subjective / Brief ROS:  Patient reports he feels fine  Family concerned about increasing confusion  Denies CP/SOB Not wearing BiPAP consistently    Family Communication: family at bedside on rounds    Objective Findings:  Vitals:   07/19/24 0324 07/19/24 0720 07/19/24 0746 07/19/24 0755  BP:  131/68    Pulse:  (!) 102  (!) 112  Resp:  13  13  Temp: 98.6 F (37 C)     TempSrc:      SpO2:  97% 98% 95%  Weight:      Height:        Intake/Output Summary (Last 24 hours) at 07/19/2024 0800 Last data filed at 07/19/2024 0248 Gross per 24 hour  Intake --  Output 800 ml  Net -800 ml   Filed Weights   07/13/24 1834 07/13/24 1904  Weight: (!) 137 kg (!) 140 kg    Examination:  Physical Exam Constitutional:      General: He is not in acute distress.  Appearance: He is obese.  Cardiovascular:     Rate and Rhythm: Tachycardia present. Rhythm irregular.  Pulmonary:     Breath sounds: Decreased breath sounds and wheezing present. No rales (minimal/atelectasis).  Musculoskeletal:     Right lower leg: No edema (trace).     Left lower leg: No edema (trace).  Neurological:     Mental Status: He is alert and oriented to person, place, and time.  Psychiatric:        Mood and Affect: Mood normal.        Behavior: Behavior normal.          Scheduled Medications:   atorvastatin   40 mg Oral Daily   azithromycin   500 mg Oral Daily   budesonide  (PULMICORT ) nebulizer solution  0.5 mg Nebulization BID   diltiazem   360 mg Oral Daily   folic acid   1 mg Oral Daily   furosemide   40 mg Oral Daily   ipratropium-albuterol   3 mL Nebulization BID   levothyroxine   50 mcg Oral Daily   methylPREDNISolone  (SOLU-MEDROL ) injection  80 mg Intravenous Daily    metoprolol  tartrate  75 mg Oral BID   nicotine   21 mg Transdermal Daily   pantoprazole   40 mg Oral Daily   polyethylene glycol  17 g Oral Daily   rivaroxaban   20 mg Oral Q supper    Continuous Infusions:  cefTRIAXone  (ROCEPHIN )  IV Stopped (07/17/24 1329)    PRN Medications:  acetaminophen , albuterol , dextromethorphan-guaiFENesin , magnesium  hydroxide, nitroGLYCERIN , senna-docusate, sodium phosphate   Antimicrobials from admission:  Anti-infectives (From admission, onward)    Start     Dose/Rate Route Frequency Ordered Stop   07/17/24 1300  cefTRIAXone  (ROCEPHIN ) 2 g in sodium chloride  0.9 % 100 mL IVPB        2 g 200 mL/hr over 30 Minutes Intravenous Every 24 hours 07/17/24 1146     07/17/24 1300  azithromycin  (ZITHROMAX ) tablet 500 mg        500 mg Oral Daily 07/17/24 1146             Data Reviewed:  I have personally reviewed the following...  CBC: Recent Labs  Lab 07/13/24 1835 07/14/24 0448 07/16/24 0533 07/18/24 0435  WBC 10.4 9.5 17.7* 18.9*  NEUTROABS 8.9*  --   --   --   HGB 14.3 14.3 13.9 14.4  HCT 44.5 44.5 43.7 49.2  MCV 92.9 93.5 93.8 99.0  PLT 198 198 208 265   Basic Metabolic Panel: Recent Labs  Lab 07/14/24 0448 07/15/24 0439 07/16/24 0533 07/17/24 0506 07/18/24 0435 07/18/24 1456 07/19/24 0416  NA 137   < > 139 143 144 143 146*  K 4.8   < > 4.6 5.1 5.3* 5.2* 5.3*  CL 90*   < > 89* 93* 92* 91* 90*  CO2 39*   < > 43* 44* >45* >45* >45*  GLUCOSE 173*   < > 132* 142* 157* 178* 154*  BUN 18   < > 47* 40* 34* 34* 33*  CREATININE 1.14   < > 1.25* 1.01 1.05 1.03 1.06  CALCIUM  8.7*   < > 9.0 9.4 9.3 9.3 9.2  MG 1.9  --   --   --   --   --   --    < > = values in this interval not displayed.   GFR: Estimated Creatinine Clearance: 103.1 mL/min (by C-G formula based on SCr of 1.06 mg/dL). Liver Function Tests: No results for input(s): AST, ALT, ALKPHOS, BILITOT, PROT,  ALBUMIN in the last 168 hours. No results for input(s):  LIPASE, AMYLASE in the last 168 hours. No results for input(s): AMMONIA in the last 168 hours. Coagulation Profile: No results for input(s): INR, PROTIME in the last 168 hours. Cardiac Enzymes: No results for input(s): CKTOTAL, CKMB, CKMBINDEX, TROPONINI in the last 168 hours. BNP (last 3 results) Recent Labs    07/13/24 1835  PROBNP 527.0*   HbA1C: No results for input(s): HGBA1C in the last 72 hours. CBG: Recent Labs  Lab 07/16/24 2118 07/17/24 0949 07/17/24 1007  GLUCAP 214* 185* 127*   Lipid Profile: No results for input(s): CHOL, HDL, LDLCALC, TRIG, CHOLHDL, LDLDIRECT in the last 72 hours. Thyroid  Function Tests: No results for input(s): TSH, T4TOTAL, FREET4, T3FREE, THYROIDAB in the last 72 hours. Anemia Panel: No results for input(s): VITAMINB12, FOLATE, FERRITIN, TIBC, IRON, RETICCTPCT in the last 72 hours. Most Recent Urinalysis On File:     Component Value Date/Time   COLORURINE AMBER (A) 11/25/2017 1618   APPEARANCEUR CLEAR (A) 11/25/2017 1618   LABSPEC 1.028 11/25/2017 1618   PHURINE 5.0 11/25/2017 1618   GLUCOSEU NEGATIVE 11/25/2017 1618   HGBUR SMALL (A) 11/25/2017 1618   BILIRUBINUR negative 10/21/2022 1103   KETONESUR NEGATIVE 11/25/2017 1618   PROTEINUR Positive (A) 10/21/2022 1103   PROTEINUR 30 (A) 11/25/2017 1618   UROBILINOGEN 0.2 10/21/2022 1103   NITRITE negative 10/21/2022 1103   NITRITE NEGATIVE 11/25/2017 1618   LEUKOCYTESUR Moderate (2+) (A) 10/21/2022 1103   Sepsis Labs: @LABRCNTIP (procalcitonin:4,lacticidven:4) Microbiology: Recent Results (from the past 240 hours)  Resp panel by RT-PCR (RSV, Flu A&B, Covid) Anterior Nasal Swab     Status: None   Collection Time: 07/13/24  6:47 PM   Specimen: Anterior Nasal Swab  Result Value Ref Range Status   SARS Coronavirus 2 by RT PCR NEGATIVE NEGATIVE Final    Comment: (NOTE) SARS-CoV-2 target nucleic acids are NOT DETECTED.  The  SARS-CoV-2 RNA is generally detectable in upper respiratory specimens during the acute phase of infection. The lowest concentration of SARS-CoV-2 viral copies this assay can detect is 138 copies/mL. A negative result does not preclude SARS-Cov-2 infection and should not be used as the sole basis for treatment or other patient management decisions. A negative result may occur with  improper specimen collection/handling, submission of specimen other than nasopharyngeal swab, presence of viral mutation(s) within the areas targeted by this assay, and inadequate number of viral copies(<138 copies/mL). A negative result must be combined with clinical observations, patient history, and epidemiological information. The expected result is Negative.  Fact Sheet for Patients:  bloggercourse.com  Fact Sheet for Healthcare Providers:  seriousbroker.it  This test is no t yet approved or cleared by the [place]  FDA and  has been authorized for detection and/or diagnosis of SARS-CoV-2 by FDA under an Emergency Use Authorization (EUA). This EUA will remain  in effect (meaning this test can be used) for the duration of the COVID-19 declaration under Section 564(b)(1) of the Act, 21 U.S.C.section 360bbb-3(b)(1), unless the authorization is terminated  or revoked sooner.       Influenza A by PCR NEGATIVE NEGATIVE Final   Influenza B by PCR NEGATIVE NEGATIVE Final    Comment: (NOTE) The Xpert Xpress SARS-CoV-2/FLU/RSV plus assay is intended as an aid in the diagnosis of influenza from Nasopharyngeal swab specimens and should not be used as a sole basis for treatment. Nasal washings and aspirates are unacceptable for Xpert Xpress SARS-CoV-2/FLU/RSV testing.  Fact Sheet for Patients:  bloggercourse.com  Fact Sheet for Healthcare Providers: seriousbroker.it  This test is not yet approved or  cleared by the [place]  FDA and has been authorized for detection and/or diagnosis of SARS-CoV-2 by FDA under an Emergency Use Authorization (EUA). This EUA will remain in effect (meaning this test can be used) for the duration of the COVID-19 declaration under Section 564(b)(1) of the Act, 21 U.S.C. section 360bbb-3(b)(1), unless the authorization is terminated or revoked.     Resp Syncytial Virus by PCR NEGATIVE NEGATIVE Final    Comment: (NOTE) Fact Sheet for Patients: bloggercourse.com  Fact Sheet for Healthcare Providers: seriousbroker.it  This test is not yet approved or cleared by the [place]  FDA and has been authorized for detection and/or diagnosis of SARS-CoV-2 by FDA under an Emergency Use Authorization (EUA). This EUA will remain in effect (meaning this test can be used) for the duration of the COVID-19 declaration under Section 564(b)(1) of the Act, 21 U.S.C. section 360bbb-3(b)(1), unless the authorization is terminated or revoked.  Performed at [facility], [address] [place], [place] [address]   Respiratory (~20 pathogens) panel by PCR     Status: None   Collection Time: 07/13/24 10:12 PM   Specimen: Nasopharyngeal Swab; Respiratory  Result Value Ref Range Status   Adenovirus NOT DETECTED NOT DETECTED Final   Coronavirus 229E NOT DETECTED NOT DETECTED Final    Comment: (NOTE) The Coronavirus on the Respiratory Panel, DOES NOT test for the novel  Coronavirus (2019 nCoV)    Coronavirus HKU1 NOT DETECTED NOT DETECTED Final   Coronavirus NL63 NOT DETECTED NOT DETECTED Final   Coronavirus OC43 NOT DETECTED NOT DETECTED Final   Metapneumovirus NOT DETECTED NOT DETECTED Final   Rhinovirus / Enterovirus NOT DETECTED NOT DETECTED Final   Influenza A NOT DETECTED NOT DETECTED Final   Influenza B NOT DETECTED NOT DETECTED Final   Parainfluenza Virus 1 NOT DETECTED NOT DETECTED Final    Parainfluenza Virus 2 NOT DETECTED NOT DETECTED Final   Parainfluenza Virus 3 NOT DETECTED NOT DETECTED Final   Parainfluenza Virus 4 NOT DETECTED NOT DETECTED Final   Respiratory Syncytial Virus NOT DETECTED NOT DETECTED Final   Bordetella pertussis NOT DETECTED NOT DETECTED Final   Bordetella Parapertussis NOT DETECTED NOT DETECTED Final   Chlamydophila pneumoniae NOT DETECTED NOT DETECTED Final   Mycoplasma pneumoniae NOT DETECTED NOT DETECTED Final    Comment: Performed at [facility], [address] [place], [place] [address]  Culture, blood (Routine X 2) w Reflex to ID Panel     Status: None (Preliminary result)   Collection Time: 07/17/24 12:03 PM   Specimen: BLOOD  Result Value Ref Range Status   Specimen Description BLOOD BLOOD RIGHT HAND  Final   Special Requests   Final    AEROBIC BOTTLE ONLY Blood Culture results may not be optimal due to an inadequate volume of blood received in culture bottles   Culture   Final    NO GROWTH 1 DAY Performed at [facility], [address] [place], [place] [address]    Report Status PENDING  Incomplete  Culture, blood (Routine X 2) w Reflex to ID Panel     Status: None (Preliminary result)   Collection Time: 07/17/24 12:07 PM   Specimen: BLOOD  Result Value Ref Range Status   Specimen Description BLOOD BLOOD LEFT HAND  Final   Special Requests   Final    AEROBIC BOTTLE ONLY Blood Culture results may not  be optimal due to an inadequate volume of blood received in culture bottles   Culture   Final    NO GROWTH 1 DAY Performed at [facility], [address] [place], [place] [address]    Report Status PENDING  Incomplete      Radiology Studies last 3 days: CT Angio Chest Pulmonary Embolism (PE) W or WO Contrast Result Date: 07/17/2024 EXAM: CTA of the Chest with and without contrast for PE 07/17/2024 10:26:40 AM TECHNIQUE: CTA of the chest was performed after the administration of intravenous contrast.  Multiplanar reformatted images are provided for review. MIP images are provided for review. Automated exposure control, iterative reconstruction, and/or weight based adjustment of the mA/kV was utilized to reduce the radiation dose to as low as reasonably achievable. CONTRAST: 75 mL iohexol  (OMNIPAQUE ) 350 MG/ML injection. COMPARISON: CTA chest 01/29/2023. CLINICAL HISTORY: 59 year old male with persistent hypoxia and inconsistent blood thinner use at home. FINDINGS: PULMONARY ARTERIES: Suboptimal but adequate pulmonary artery contrast timing. No convincing pulmonary artery filling defect. Main pulmonary artery is normal in caliber. Some pulmonary artery detail limited by motion, especially in the Right upper lobe. No convincing pulmonary artery filling defect. MEDIASTINUM: The heart and pericardium demonstrate no acute abnormality. Calcified coronary artery and aortic atherosclerosis. There is no acute abnormality of the thoracic aorta. LYMPH NODES: Mediastinal lymph nodes are stable since 2024, likely reactive. LUNGS AND PLEURA: Mild respiratory motion. Sabre sheath trachea configuration suggesting chronic obstructive pulmonary disease (series 8 image 33). Atelectatic changes to the airways and some retained secretions also suspected in the Right mainstem bronchus. Superimposed generalized central bronchial wall thickening bilaterally. Tree in bud nodular opacity scattered in the Right upper lobe, throughout the Right lower lobe, and superimposed confluent peribronchial opacity in the Lingula, Posterior basal segment of the Right lower lobe, and Medial segment of the Right middle lobe. This more resembles developing consolidation than atelectasis. No pleural effusion. No pneumothorax. UPPER ABDOMEN: Similar mild contrast reflux from the right atrium into the hepatic IVC and hepatic veins. Negative visible other non-contrast upper abdominal viscera. SOFT TISSUES AND BONES: No acute bone or soft tissue abnormality.  IMPRESSION: 1. No convincing pulmonary embolus; evaluation limited by motion and suboptimal contrast timing. 2. Evidence of COPD with superimposed generalized bronchitis, and bilateral respiratory infection with multifocal early consolidation. No pleural effusion. Electronically signed by: Helayne Hurst MD MD 07/17/2024 10:55 AM EST RP Workstation: [id number]   ECHOCARDIOGRAM COMPLETE Result Date: 07/16/2024    ECHOCARDIOGRAM REPORT   Patient Name:   Ryan Hatfield [facility] Date of Exam: 07/16/2024 Medical Rec #:  [id number]         Height:       69.0 in Accession #:    [id number]        Weight:       308.6 lb Date of Birth:  1966-06-25         BSA:          2.484 m Patient Age:    58 years          BP:           105/61 mmHg Patient Gender: M                 HR:           84 bpm. Exam Location:  [facility] Procedure: 2D Echo, Cardiac Doppler, Color Doppler and Intracardiac            Opacification Agent (Both Spectral and Color  Flow Doppler were            utilized during procedure). Indications:     CHF I50.31  History:         Patient has prior history of Echocardiogram examinations, most                  recent 05/05/2023.  Sonographer:     Thedora Louder RDCS, FASE Referring Phys:  [id number] LANETA BLUNT Diagnosing Phys: Annabella Scarce MD  Sonographer Comments: Technically difficult study due to poor echo windows, suboptimal apical window and patient is obese. Image acquisition challenging due to patient body habitus and Image acquisition challenging due to respiratory motion. The patient was on 12L of oxygen  at the time of this study. IMPRESSIONS  1. Left ventricular ejection fraction, by estimation, is 60 to 65%. Left ventricular ejection fraction by PLAX is 63 %. The left ventricle has normal function. The left ventricle has no regional wall motion abnormalities. There is moderate concentric left ventricular hypertrophy. Left ventricular diastolic parameters are indeterminate.  2. Right ventricular systolic  function is normal. The right ventricular size is normal.  3. The mitral valve is normal in structure. No evidence of mitral valve regurgitation. No evidence of mitral stenosis.  4. The aortic valve is tricuspid. Aortic valve regurgitation is not visualized. No aortic stenosis is present.  5. The inferior vena cava is dilated in size with >50% respiratory variability, suggesting right atrial pressure of 8 mmHg. FINDINGS  Left Ventricle: Left ventricular ejection fraction, by estimation, is 60 to 65%. Left ventricular ejection fraction by PLAX is 63 %. The left ventricle has normal function. The left ventricle has no regional wall motion abnormalities. Definity  contrast agent was given IV to delineate the left ventricular endocardial borders. The left ventricular internal cavity size was normal in size. There is moderate concentric left ventricular hypertrophy. Left ventricular diastolic function could not be evaluated due to atrial fibrillation. Left ventricular diastolic parameters are indeterminate. Normal left ventricular filling pressure. Right Ventricle: The right ventricular size is normal. No increase in right ventricular wall thickness. Right ventricular systolic function is normal. Left Atrium: Left atrial size was normal in size. Right Atrium: Right atrial size was normal in size. Pericardium: There is no evidence of pericardial effusion. Mitral Valve: The mitral valve is normal in structure. Mild mitral annular calcification. No evidence of mitral valve regurgitation. No evidence of mitral valve stenosis. Tricuspid Valve: The tricuspid valve is normal in structure. Tricuspid valve regurgitation is not demonstrated. No evidence of tricuspid stenosis. Aortic Valve: The aortic valve is tricuspid. Aortic valve regurgitation is not visualized. No aortic stenosis is present. Aortic valve peak gradient measures 0.9 mmHg. Pulmonic Valve: The pulmonic valve was normal in structure. Pulmonic valve regurgitation is  not visualized. No evidence of pulmonic stenosis. Aorta: The aortic root is normal in size and structure. Venous: The inferior vena cava is dilated in size with greater than 50% respiratory variability, suggesting right atrial pressure of 8 mmHg. IAS/Shunts: No atrial level shunt detected by color flow Doppler.  LEFT VENTRICLE PLAX 2D LV EF:         Left            Diastology                ventricular     LV e' medial:    11.40 cm/s                ejection  LV E/e' medial:  9.4                fraction by     LV e' lateral:   9.36 cm/s                PLAX is 63      LV E/e' lateral: 11.4                %. LVIDd:         4.45 cm LVIDs:         2.95 cm LV PW:         1.40 cm LV IVS:        1.30 cm LVOT diam:     2.40 cm LVOT Area:     4.52 cm  RIGHT VENTRICLE RV Basal diam:  3.00 cm RV S prime:     11.40 cm/s TAPSE (M-mode): 1.8 cm LEFT ATRIUM             Index        RIGHT ATRIUM           Index LA diam:        3.90 cm 1.57 cm/m   RA Area:     19.80 cm LA Vol (A2C):   49.3 ml 19.84 ml/m  RA Volume:   54.30 ml  21.86 ml/m LA Vol (A4C):   34.9 ml 14.05 ml/m LA Biplane Vol: 45.6 ml 18.35 ml/m  AORTIC VALVE             PULMONIC VALVE AV Vmax:      47.10 cm/s PV Vmax:        1.14 m/s AV Peak Grad: 0.9 mmHg   PV Peak grad:   5.2 mmHg                          RVOT Peak grad: 5 mmHg AORTA Ao Root diam: 3.20 cm Ao Asc diam:  3.10 cm MITRAL VALVE MV Area (PHT): 3.30 cm     SHUNTS MV Decel Time: 230 msec     Systemic Diam: 2.40 cm MV E velocity: 107.00 cm/s Annabella Scarce MD Electronically signed by Annabella Scarce MD Signature Date/Time: 07/16/2024/1:12:06 PM    Final      CRITICAL CARE Performed by: Laneta Blunt   Total critical care time: 30 minutes  Critical care time was exclusive of separately billable procedures and treating other patients.  Critical care was necessary to treat or prevent imminent or life-threatening deterioration.  Critical care was time spent personally by me on the  following activities: development of treatment plan with patient and/or surrogate as well as nursing, discussions with consultants, evaluation of patient's response to treatment, examination of patient, obtaining history from patient or surrogate, ordering and performing treatments and interventions, ordering and review of laboratory studies, ordering and review of radiographic studies, pulse oximetry and re-evaluation of patient's condition.     Glendell Fouse, DO [facility] 07/19/2024, 8:00 AM    Dictation software may have been used to generate the above note. Typos may occur and escape review in typed/dictated notes. Please contact Dr Blunt directly for clarity if needed.  Staff may message me via secure chat in Epic  but this may not receive an immediate response,  please page me for urgent matters!  If 7PM-7AM, please contact night coverage [email or web address]       "

## 2024-07-18 NOTE — Plan of Care (Signed)
   Problem: Education: Goal: Knowledge of disease or condition will improve Outcome: Progressing

## 2024-07-18 NOTE — Progress Notes (Incomplete)
 "       Overnight   NAME: Ryan Hatfield MRN: [id number] DOB : [birth year]    Date of Service   07/18/2024   HPI/Events of Note   HPI:   59 year old male with medical history significant for D CHF, HTN, HLD, COPD on 2 L oxygen , hypothyroidism, GERD, depression with anxiety, A-fib on Eliquis , morbid obesity, smoker, alcohol abuse in remission, medication noncompliance who presented with shortness of breath.   Patient states that he is not taking most of his medications including Eliquis  for a while due to insurance issue. (He is not able to recall some medications he's not been taking, reports not on inhalers recently, not sure about all cradiac meds but reports he's been on his eliquis ) He developed SOB in the past 2 days, which has been progressively worsening. He has cough with clear mucus production, no chest pain, fever or chills. Patient is normally using 2 L oxygen  at baseline, but was found to have oxygen  desaturation to 76% on home level 2 L oxygen , initially started 6-8 L oxygen , still has respiratory distress and difficulty speaking in full sentence, then started BiPAP with improvement.  BiPAP is weaned off now, currently on 6 L oxygen  with 100% of saturation.    01/07:  to ED. BNP 527, GFR> 60, troponin 20 --> 21, negative PCR for COVID, flu and RSV. Temperature normal, blood pressure 158/62, heart rate 101 --> 84, RR 21.  Chest x-ray showed cardiomegaly and bibasilar atelectasis.  Patient is admitted to PCU as inpatient. Needing HFNC up to 15 L/min 01/08: BiPAP overnight w/ improvement in Afib rate. Off BiPAP today per patient request, remains on high flow [place] 15 L/min. Continue diuresis. UOP yesterday -1400 mL 01/09: HFNC 11 L/min, no BiPAP overnight. UOP yesterday -1500 mL. Back into Afib RVR, dilt bolus ineffective, starting drip, Echo ordered. D/w cardiology, plan to see him tomorrow but they are aware of his case in case decompensation / emergent cardioversion needed 01/10:  Cardiology following --> dc amiodarone  (pt now notes spotty compliance w/ home Eliquis ), continue diltiazem  infusion, changed to metoprolol  tartrate 37.5 mg q6h, holding lasix  for today. Echo EF 60-65 mod LVH indeterminate diastolic 01/11: consolidate diltiazem  to 360mg  daily and metoprolol  75mg  bid. Still on high low [place] 13L/[place] CT chest, no PE, (+)concern pneumonia so started abx, later up to [facility] 50L/min. Will ask pulmonary to review CT, question may need bronch - does not - duonebs q4h, add pulmicort  01/12: hypercarbic, on BiPAP overnight and  back on HHFNC through the day, repeating BMP this afternoon. CO2 narcosis mild confusion  Overnight: RN reports patient refusing to wear high flow heated nasal cannula.  ABG ordered for increase in confusion.  On arrival to room patient was redirectable and agreeable to wear BiPAP.  Physical Exam Cardiovascular:     Rate and Rhythm: Normal rate and regular rhythm.     Pulses:          Radial pulses are 2+ on the right side and 2+ on the left side.       Dorsalis pedis pulses are 2+ on the right side and 2+ on the left side.  Pulmonary:     Breath sounds: Examination of the right-upper field reveals decreased breath sounds. Decreased breath sounds present.  Abdominal:     General: Bowel sounds are normal.  Skin:    General: Skin is warm and dry.     Capillary Refill: Capillary refill takes 2 to 3 seconds.  Neurological:     Mental Status: He is alert. He is disoriented.     GCS: GCS eye subscore is 4. GCS verbal subscore is 4. GCS motor subscore is 6.  Psychiatric:        Mood and Affect: Mood is anxious.       Interventions/ Plan   Encouraged BiPAP use overnight.  Updates 04:00-RN reports patient becoming increasingly more agitated unable to follow instructions requesting sitter and medication for agitation.  Delirium precautions initiated sitter initiated 0.5 mg IV Ativan  ordered for agitation.  Stressed concern for sedation while patient  is on BiPAP to RN encouraged delirium precautions     Laneta Gardener- Garmon BSN RN CCRN AGACNP-BC Acute Care Nurse Practitioner [facility] [place]  "

## 2024-07-18 NOTE — Progress Notes (Signed)
 "  Progress Note  Patient Name: Ryan Hatfield Date of Encounter: 07/18/2024 [facility] Cardiologist: Redell Cave, MD   Interval Summary    Remains in Afib with HR in the 90s. Daughter feels her father is acting a little confused. Patient denies chest pain. Breathing is improving. UOP - .  Vital Signs Vitals:   07/18/24 0826 07/18/24 0833 07/18/24 0834 07/18/24 1337  BP:      Pulse:  99    Resp:  20  (!) 25  Temp:      TempSrc:      SpO2: 91% 95% 91% 95%  Weight:      Height:        Intake/Output Summary (Last 24 hours) at 07/18/2024 1406 Last data filed at 07/17/2024 2124 Gross per 24 hour  Intake 110 ml  Output --  Net 110 ml      07/13/2024    7:04 PM 07/13/2024    6:34 PM 06/22/2024    3:24 PM  Last 3 Weights  Weight (lbs) 308 lb 10.3 oz 302 lb 0.5 oz 303 lb 12.8 oz  Weight (kg) 140 kg 137 kg 137.803 kg      Telemetry/ECG  Afib HR 90s - Personally Reviewed  Physical Exam  GEN: No acute distress.   Neck: No JVD Cardiac: Irreg Irreg, no murmurs, rubs, or gallops.  Respiratory: rhonchii GI: Soft, nontender, non-distended  MS: No edema  Cardiac Studies   07/16/2024 2D echo 1. Left ventricular ejection fraction, by estimation, is 60 to 65%. Left  ventricular ejection fraction by PLAX is 63 %. The left ventricle has  normal function. The left ventricle has no regional wall motion  abnormalities. There is moderate concentric  left ventricular hypertrophy. Left ventricular diastolic parameters are  indeterminate.   2. Right ventricular systolic function is normal. The right ventricular  size is normal.   3. The mitral valve is normal in structure. No evidence of mitral valve  regurgitation. No evidence of mitral stenosis.   4. The aortic valve is tricuspid. Aortic valve regurgitation is not  visualized. No aortic stenosis is present.   5. The inferior vena cava is dilated in size with >50% respiratory  variability, suggesting right atrial  pressure of 8 mmHg.    Patient Profile   59 y.o. male with a hx of nonobstructive CAD, chronic HFpEF, atrial flutter s/p TEE cardioversion 01/2023, hypertension, hyperlipidemia, OSA, COPD, gout, and tobacco abuse who was admitted 1/7 with shortness of breath secondary to COPD exacerbation, and who is being seen for the ongoing management of atrial fibrillation with RVR.   Assessment & Plan   Afib with RVR - presenting 1/7 for worsening SOB with initial EKG revealing NSR - noted to be in Aflutter on 1/8 with repeat EKG 1/9 showing Afib - Echo showed preserved LVSF - patient reports intermittent compliance with Eliquis  prior to admission.  - remains in Afib with HR around 100 - continue dilt 360mg  daily and Lopressor  75mg  BID - amio previously stopped due to a/c noncompliance - transitioned from Eliquis  to Xarelto  for complaince  Acute on chronic HFpEF - pre-BNP minimally elevated on admission 527 - Echo showed LVEF 60-65%, indeterminate diastolic parameters - IV diuresis with net -5L, now on oral - continue lasix  40mg  daily - appears euvolemic  Acute on chronic respiratory failure COPD exacerbation - in the setting of acute on chronic CHF and cOPD exacerbation - remains on HFNC - per IM  CAD Elevated troponin - nonobstructive  CAD on cath 01/2023 - denies cehst pain - HS trop minimally elevated with flat trending, not consistent with ACS    For questions or updates, please contact [facility] Please consult [email or web address] for contact info under         Signed, Ryan Gilcrest VEAR Fishman, PA-C   "

## 2024-07-19 ENCOUNTER — Other Ambulatory Visit (HOSPITAL_COMMUNITY): Payer: Self-pay

## 2024-07-19 DIAGNOSIS — E873 Alkalosis: Secondary | ICD-10-CM

## 2024-07-19 DIAGNOSIS — J9621 Acute and chronic respiratory failure with hypoxia: Secondary | ICD-10-CM | POA: Diagnosis not present

## 2024-07-19 DIAGNOSIS — I4819 Other persistent atrial fibrillation: Secondary | ICD-10-CM

## 2024-07-19 DIAGNOSIS — F1010 Alcohol abuse, uncomplicated: Secondary | ICD-10-CM | POA: Diagnosis not present

## 2024-07-19 DIAGNOSIS — R7989 Other specified abnormal findings of blood chemistry: Secondary | ICD-10-CM

## 2024-07-19 DIAGNOSIS — J9622 Acute and chronic respiratory failure with hypercapnia: Secondary | ICD-10-CM

## 2024-07-19 DIAGNOSIS — E870 Hyperosmolality and hypernatremia: Secondary | ICD-10-CM | POA: Diagnosis not present

## 2024-07-19 DIAGNOSIS — G4733 Obstructive sleep apnea (adult) (pediatric): Secondary | ICD-10-CM | POA: Diagnosis not present

## 2024-07-19 DIAGNOSIS — I110 Hypertensive heart disease with heart failure: Secondary | ICD-10-CM | POA: Diagnosis not present

## 2024-07-19 DIAGNOSIS — I2489 Other forms of acute ischemic heart disease: Secondary | ICD-10-CM

## 2024-07-19 DIAGNOSIS — E875 Hyperkalemia: Secondary | ICD-10-CM | POA: Diagnosis not present

## 2024-07-19 DIAGNOSIS — I5033 Acute on chronic diastolic (congestive) heart failure: Secondary | ICD-10-CM | POA: Diagnosis not present

## 2024-07-19 DIAGNOSIS — G928 Other toxic encephalopathy: Secondary | ICD-10-CM | POA: Diagnosis not present

## 2024-07-19 LAB — MAGNESIUM: Magnesium: 2.5 mg/dL — ABNORMAL HIGH (ref 1.7–2.4)

## 2024-07-19 LAB — BLOOD GAS, ARTERIAL
Acid-Base Excess: 34.1 mmol/L — ABNORMAL HIGH (ref 0.0–2.0)
Acid-Base Excess: 25 mmol/L — ABNORMAL HIGH (ref 0.0–2.0)
Bicarbonate: 67.2 mmol/L — ABNORMAL HIGH (ref 20.0–28.0)
Bicarbonate: 55.7 mmol/L — ABNORMAL HIGH (ref 20.0–28.0)
Delivery systems: POSITIVE
FIO2: 44 %
FIO2: 40 %
MECHVT: 500 mL
Mechanical Rate: 20
O2 Content: 40 L/min
O2 Saturation: 91 %
O2 Saturation: 95.5 %
PEEP: 8 cmH2O
Patient temperature: 37
Patient temperature: 37
pCO2 arterial: 99 mmHg (ref 32–48)
pCO2 arterial: 90 mmHg (ref 32–48)
pH, Arterial: 7.44 (ref 7.35–7.45)
pH, Arterial: 7.4 (ref 7.35–7.45)
pO2, Arterial: 59 mmHg — ABNORMAL LOW (ref 83–108)
pO2, Arterial: 75 mmHg — ABNORMAL LOW (ref 83–108)

## 2024-07-19 LAB — BASIC METABOLIC PANEL WITH GFR
Anion gap: 11 (ref 5–15)
BUN: 33 mg/dL — ABNORMAL HIGH (ref 6–20)
BUN: 33 mg/dL — ABNORMAL HIGH (ref 6–20)
CO2: >45 mmol/L — ABNORMAL HIGH (ref 22–32)
CO2: 38 mmol/L — ABNORMAL HIGH (ref 22–32)
Calcium: 9.2 mg/dL (ref 8.9–10.3)
Calcium: 9 mg/dL (ref 8.9–10.3)
Chloride: 90 mmol/L — ABNORMAL LOW (ref 98–111)
Chloride: 91 mmol/L — ABNORMAL LOW (ref 98–111)
Creatinine, Ser: 1.06 mg/dL (ref 0.61–1.24)
Creatinine, Ser: 1.12 mg/dL (ref 0.61–1.24)
GFR, Estimated: >60 mL/min
GFR, Estimated: >60 mL/min
Glucose, Bld: 154 mg/dL — ABNORMAL HIGH (ref 70–99)
Glucose, Bld: 302 mg/dL — ABNORMAL HIGH (ref 70–99)
Potassium: 5.3 mmol/L — ABNORMAL HIGH (ref 3.5–5.1)
Potassium: 4.6 mmol/L (ref 3.5–5.1)
Sodium: 146 mmol/L — ABNORMAL HIGH (ref 135–145)
Sodium: 139 mmol/L (ref 135–145)

## 2024-07-19 LAB — GLUCOSE, CAPILLARY
Glucose-Capillary: 191 mg/dL — ABNORMAL HIGH (ref 70–99)
Glucose-Capillary: 273 mg/dL — ABNORMAL HIGH (ref 70–99)
Glucose-Capillary: 266 mg/dL — ABNORMAL HIGH (ref 70–99)

## 2024-07-19 LAB — MRSA NEXT GEN BY PCR, NASAL: MRSA by PCR Next Gen: NOT DETECTED

## 2024-07-19 MED ORDER — ACETAZOLAMIDE SODIUM 500 MG IJ SOLR
500.0000 mg | Freq: Once | INTRAMUSCULAR | Status: AC
  Administered 2024-07-19: 500 mg via INTRAVENOUS
  Filled 2024-07-19: qty 500

## 2024-07-19 MED ORDER — INSULIN ASPART 100 UNIT/ML IJ SOLN
0.0000 [IU] | Freq: Every day | INTRAMUSCULAR | Status: DC
  Administered 2024-07-19: 3 [IU] via SUBCUTANEOUS
  Administered 2024-07-20 – 2024-07-21 (×2): 2 [IU] via SUBCUTANEOUS
  Filled 2024-07-19 (×4): qty 2
  Filled 2024-07-19: qty 4
  Filled 2024-07-19: qty 2

## 2024-07-19 MED ORDER — THIAMINE HCL 100 MG/ML IJ SOLN
500.0000 mg | Freq: Three times a day (TID) | INTRAVENOUS | Status: AC
  Administered 2024-07-19 – 2024-07-22 (×9): 500 mg via INTRAVENOUS
  Filled 2024-07-19 (×9): qty 5

## 2024-07-19 MED ORDER — IPRATROPIUM-ALBUTEROL 0.5-2.5 (3) MG/3ML IN SOLN
3.0000 mL | RESPIRATORY_TRACT | Status: DC
  Administered 2024-07-19 – 2024-07-21 (×12): 3 mL via RESPIRATORY_TRACT
  Filled 2024-07-19 (×12): qty 3

## 2024-07-19 MED ORDER — LORAZEPAM 2 MG/ML IJ SOLN
0.5000 mg | Freq: Once | INTRAMUSCULAR | Status: DC
  Filled 2024-07-19 (×2): qty 1

## 2024-07-19 MED ORDER — THIAMINE HCL 100 MG/ML IJ SOLN
250.0000 mg | INTRAVENOUS | Status: AC
  Administered 2024-07-23 – 2024-07-24 (×2): 250 mg via INTRAVENOUS
  Filled 2024-07-19 (×2): qty 2.5

## 2024-07-19 MED ORDER — INSULIN ASPART 100 UNIT/ML IJ SOLN
0.0000 [IU] | Freq: Three times a day (TID) | INTRAMUSCULAR | Status: DC
  Administered 2024-07-19: 8 [IU] via SUBCUTANEOUS
  Filled 2024-07-19: qty 8

## 2024-07-19 MED ORDER — INSULIN ASPART 100 UNIT/ML IJ SOLN: 0.0000 [IU] | Freq: Every day | INTRAMUSCULAR | Status: DC

## 2024-07-19 MED ORDER — THIAMINE HCL 100 MG/ML IJ SOLN
100.0000 mg | INTRAMUSCULAR | Status: DC
  Administered 2024-07-25: 100 mg via INTRAVENOUS
  Filled 2024-07-19: qty 2

## 2024-07-19 MED ORDER — CHLORHEXIDINE GLUCONATE CLOTH 2 % EX PADS
6.0000 | MEDICATED_PAD | Freq: Every day | CUTANEOUS | Status: DC
  Administered 2024-07-20 – 2024-07-24 (×5): 6 via TOPICAL

## 2024-07-19 MED ORDER — INSULIN ASPART 100 UNIT/ML IJ SOLN
0.0000 [IU] | Freq: Three times a day (TID) | INTRAMUSCULAR | Status: DC
  Administered 2024-07-20 (×2): 4 [IU] via SUBCUTANEOUS
  Administered 2024-07-20 – 2024-07-21 (×3): 7 [IU] via SUBCUTANEOUS
  Administered 2024-07-21 – 2024-07-22 (×3): 4 [IU] via SUBCUTANEOUS
  Administered 2024-07-22 – 2024-07-23 (×2): 7 [IU] via SUBCUTANEOUS
  Administered 2024-07-23: 3 [IU] via SUBCUTANEOUS
  Administered 2024-07-23: 7 [IU] via SUBCUTANEOUS
  Administered 2024-07-24: 4 [IU] via SUBCUTANEOUS
  Administered 2024-07-24: 3 [IU] via SUBCUTANEOUS
  Administered 2024-07-24: 11 [IU] via SUBCUTANEOUS
  Administered 2024-07-25: 3 [IU] via SUBCUTANEOUS
  Administered 2024-07-25: 4 [IU] via SUBCUTANEOUS
  Filled 2024-07-19 (×3): qty 7
  Filled 2024-07-19: qty 4
  Filled 2024-07-19 (×2): qty 3
  Filled 2024-07-19: qty 4
  Filled 2024-07-19 (×2): qty 5
  Filled 2024-07-19: qty 7
  Filled 2024-07-19: qty 11
  Filled 2024-07-19 (×4): qty 4
  Filled 2024-07-19: qty 3
  Filled 2024-07-19: qty 7

## 2024-07-19 MED ORDER — LORAZEPAM 2 MG/ML IJ SOLN
0.5000 mg | Freq: Once | INTRAMUSCULAR | Status: AC
  Administered 2024-07-19: 0.5 mg via INTRAVENOUS

## 2024-07-19 MED ORDER — CHLORHEXIDINE GLUCONATE CLOTH 2 % EX PADS
6.0000 | MEDICATED_PAD | Freq: Every day | CUTANEOUS | Status: DC
  Administered 2024-07-19: 6 via TOPICAL

## 2024-07-19 MED ORDER — DILTIAZEM HCL 25 MG/5ML IV SOLN
5.0000 mg | Freq: Once | INTRAVENOUS | Status: AC
  Administered 2024-07-19: 5 mg via INTRAVENOUS
  Filled 2024-07-19: qty 5

## 2024-07-19 MED ORDER — METHYLPREDNISOLONE SODIUM SUCC 40 MG IJ SOLR
20.0000 mg | Freq: Two times a day (BID) | INTRAMUSCULAR | Status: DC
  Administered 2024-07-19 – 2024-07-21 (×4): 20 mg via INTRAVENOUS
  Filled 2024-07-19 (×4): qty 1

## 2024-07-19 NOTE — Progress Notes (Signed)
 Report called to [facility] in ICU. Patient transferred to ICU 1 companied by respiratory therapy.

## 2024-07-19 NOTE — Progress Notes (Signed)
 Reexamined pt in stepdown unit approx 17:00  Alert, no complaints  BP (!) 151/56   Pulse 85   Temp 98.3 F (36.8 C) (Axillary)   Resp 19   Ht 5' 9 (1.753 m)   Wt 128 kg   SpO2 93%   BMI 41.67 kg/m  Alert, conversational, NAD Tachycardic, irregular  Appears improved  Appreciate PCCU involvement  TRH continue to be primary unless he needs intubation/ventilation, hopefully will not now that he's improved Still high risk overnight - he has been intolerant of BiPAP in the past, agitated overnight yesterday, will remain in SDU for now in case needing precedex or other intensive intervention

## 2024-07-19 NOTE — Progress Notes (Signed)
 Patient has removed bipap multiple times within 30 minutes. Heated High flow nasal cannula has been reapplied.

## 2024-07-19 NOTE — Progress Notes (Signed)
 " PROGRESS NOTE    Ryan Hatfield   FMW:[id number] DOB: Dec 27, 1965  DOA: 07/13/2024 Date of Service: 07/19/2024 which is hospital day 6  PCP: Corwin Antu, [facility] course / significant events:   Ryan Hatfield is a 59 y.o. male with medical history significant of dCHF, HTN, HLD, COPD on 2L oxygen , hypothyroidism, GERD, depression with anxiety, A-fib on Eliquis , morbid obesity, smoker, alcohol abuse in remission, medication noncompliance, who presents with SOB.   HPI: Patient states that he is not taking most of his medications including Eliquis  for a while due to insurance issue. (He is not able to recall some medications he's not been taking, reports not on inhalers recently, not sure about all cradiac meds but reports he's been on his eliquis ) He developed SOB in the past 2 days, which has been progressively worsening. He has cough with clear mucus production, no chest pain, fever or chills. Patient is normally using 2 L oxygen  at baseline, but was found to have oxygen  desaturation to 76% on home level 2 L oxygen , initially started 6-8 L oxygen , still has respiratory distress and difficulty speaking in full sentence, then started BiPAP with improvement.  BiPAP is weaned off now, currently on 6 L oxygen  with 100% of saturation.   01/07:  to ED. BNP 527, GFR> 60, troponin 20 --> 21, negative PCR for COVID, flu and RSV. Temperature normal, blood pressure 158/62, heart rate 101 --> 84, RR 21.  Chest x-ray showed cardiomegaly and bibasilar atelectasis.  Patient is admitted to PCU as inpatient. Needing HFNC up to 15 L/min 01/08: BiPAP overnight w/ improvement in Afib rate. Off BiPAP today per patient request, remains on high flow [place] 15 L/min. Continue diuresis. UOP yesterday -1400 mL 01/09: HFNC 11 L/min, no BiPAP overnight. UOP yesterday -1500 mL. Back into Afib RVR, dilt bolus ineffective, starting drip, Echo ordered. D/w cardiology, plan to see him tomorrow but they are aware of his  case in case decompensation / emergent cardioversion needed 01/10: Cardiology following --> dc amiodarone  (pt now notes spotty compliance w/ home Eliquis ), continue diltiazem  infusion, changed to metoprolol  tartrate 37.5 mg q6h, holding lasix  for today. Echo EF 60-65 mod LVH indeterminate diastolic 01/11: consolidate diltiazem  to 360mg  daily and metoprolol  75mg  bid. Still on high low [place] 13L/[place] CT chest, no PE, (+)concern pneumonia so started abx, later up to [facility] 50L/min. Will ask pulmonary to review CT, question may need bronch - does not - duonebs q4h, add pulmicort  01/12: hypercarbic, on BiPAP overnight and  back on HHFNC through the day, repeating BMP this afternoon. CO2 narcosis mild confusion  01/13: more confused/agitated overnight, unresponsive this morning, transferring to ICU anticipate intubation pr potentially needs precedex + BiPAP     Consultants:  Cardiology   Procedures/Surgeries: none      ASSESSMENT & PLAN:   Encephalopathy due to CO2 narcosis Hypoventilation transferring to ICU  anticipate intubation or potentially needs precedex + BiPAP  Acute on chronic respiratory failure with hypoxia Likely due to combination of COPD and CHF exacerbation.  Primarily COPD  Acute hypoxic/hypercarbic respiratory failure   O2 titrate as needed / wean as able - back on BiPAP this evening and need to stay on this through the night  Treat underlying cause(s) as noted  COPD with acute exacerbation  Wheezing on exam  Duoneb q4h Budesonide  nebs  prn Mucinex  Solu-Medrol  80 mg IV daily after given 125 mg of Solu-Medrol  Incentive spirometry Follow up sputum culture Low threshold  for pulmonary consult but appreciate Dr Isaiah reviwing imaging and confirming no need for bronch or other concerns other than noted above   Community acquired PNA Ceftriaxone  + azithromycin   OSA Suspect may need BiPAP at bedtime long-term  Acute on chronic diastolic CHF (congestive heart failure)   Patient presenting w/ leg edema, indicating possible CHF exacerbation.  His BNP was 527 which is likely falsely low due to morbid obesity. Last echo 2024 - EF 60-65%, grade 1 diast df  07/16/2024 Echo EF 60-65 mod LVH indeterminate diastolic appears relatively euvolemic on exam although volume status is difficult to assess given body habitus  Lasix  40 mg po daily  Consider addition of SGLT2 inhibitor prior to discharge echo pending Daily weights strict I/O's Low salt diet Fluid restriction  Afib RVR Cardiology following  Eliquis  - pt reports compliance at home however later admits to taking this intermittently / daily rather than consistent bid dosing --> transition to Xarelto  to help compliance  diltiazem  to 360mg  daily  Metoprolol  to 75mg  bid.  holding amiodarone  given uncertain compliance w/ Eliquis  Metoprolol  tartrate 37.5 mg every 6 hours  Hold Imdur  to allow BP room for rate control.  Telemetry Cardiology following   CAD - Nonobstructive CAD on cath 01/2023  Elevation troponin Likely demand ischemia Repeat as needed EKG as needed chest pain  Atorvastatin , Xarelto    Essential hypertension IV hydralazine  as needed Imdur  held for now Metoprolol  as above  diltiazem  as above    Leukocytosis Steroid effect Monitor CBC periodically / as needed  Acquired hypothyroidism Synthroid    Tobacco abuse Nicotine  patch Did counseling about importance of quitting smoking     Class 3 obesity based on BMI: Body mass index is 45.58 kg/m.SABRA Significantly low or high BMI is associated with higher medical risk.  Underweight - under 18  overweight - 25 to 29 obese - 30 or more Class 1 obesity: BMI of 30.0 to 34 Class 2 obesity: BMI of 35.0 to 39 Class 3 obesity: BMI of 40.0 to 49 Super Morbid Obesity: BMI 50-59 Super-super Morbid Obesity: BMI 60+ Healthy nutrition and physical activity advised as adjunct to other disease management and risk reduction treatments    DVT  prophylaxis: eliquis  IV fluids: no continuous IV fluids  Nutrition: cardaic Central lines / other devices: none  Code Status: FULL CODE ACP documentation reviewed:  none on file in [facility]  TOC needs: TBD Medical barriers to dispo: O2 requirement - HFNC on progressive unit. Afib RVR Expected medical readiness for discharge several days.              Subjective / Brief ROS:  Patient not alerting to voice and minimally withdrawing from pain   Family Communication: called to daughter 07/19/2024 8:01 AM left voicemail informing we are sending him to ICU    Objective Findings:  Vitals:   07/19/24 0324 07/19/24 0720 07/19/24 0746 07/19/24 0755  BP:  131/68    Pulse:  (!) 102  (!) 112  Resp:  13  13  Temp: 98.6 F (37 C)     TempSrc:      SpO2:  97% 98% 95%  Weight:      Height:        Intake/Output Summary (Last 24 hours) at 07/19/2024 0801 Last data filed at 07/19/2024 0248 Gross per 24 hour  Intake --  Output 800 ml  Net -800 ml   Filed Weights   07/13/24 1834 07/13/24 1904  Weight: (!) 137 kg (!) 140 kg  Examination:  Physical Exam Constitutional:      General: He is not in acute distress.    Appearance: He is obese.  Cardiovascular:     Rate and Rhythm: Tachycardia present. Rhythm irregular.  Pulmonary:     Breath sounds: Decreased breath sounds and wheezing present. No rales (minimal/atelectasis).  Musculoskeletal:     Right lower leg: No edema (trace).     Left lower leg: No edema (trace).  Neurological:     Mental Status: He is oriented to person, place, and time.  Psychiatric:        Mood and Affect: Mood normal.        Behavior: Behavior normal.          Scheduled Medications:   atorvastatin   40 mg Oral Daily   azithromycin   500 mg Oral Daily   budesonide  (PULMICORT ) nebulizer solution  0.5 mg Nebulization BID   diltiazem   360 mg Oral Daily   folic acid   1 mg Oral Daily   furosemide   40 mg Oral Daily   ipratropium-albuterol   3 mL  Nebulization BID   levothyroxine   50 mcg Oral Daily   methylPREDNISolone  (SOLU-MEDROL ) injection  80 mg Intravenous Daily   metoprolol  tartrate  75 mg Oral BID   nicotine   21 mg Transdermal Daily   pantoprazole   40 mg Oral Daily   polyethylene glycol  17 g Oral Daily   rivaroxaban   20 mg Oral Q supper    Continuous Infusions:  cefTRIAXone  (ROCEPHIN )  IV Stopped (07/17/24 1329)    PRN Medications:  acetaminophen , albuterol , dextromethorphan-guaiFENesin , magnesium  hydroxide, nitroGLYCERIN , senna-docusate, sodium phosphate   Antimicrobials from admission:  Anti-infectives (From admission, onward)    Start     Dose/Rate Route Frequency Ordered Stop   07/17/24 1300  cefTRIAXone  (ROCEPHIN ) 2 g in sodium chloride  0.9 % 100 mL IVPB        2 g 200 mL/hr over 30 Minutes Intravenous Every 24 hours 07/17/24 1146     07/17/24 1300  azithromycin  (ZITHROMAX ) tablet 500 mg        500 mg Oral Daily 07/17/24 1146             Data Reviewed:  I have personally reviewed the following...  CBC: Recent Labs  Lab 07/13/24 1835 07/14/24 0448 07/16/24 0533 07/18/24 0435  WBC 10.4 9.5 17.7* 18.9*  NEUTROABS 8.9*  --   --   --   HGB 14.3 14.3 13.9 14.4  HCT 44.5 44.5 43.7 49.2  MCV 92.9 93.5 93.8 99.0  PLT 198 198 208 265   Basic Metabolic Panel: Recent Labs  Lab 07/14/24 0448 07/15/24 0439 07/16/24 0533 07/17/24 0506 07/18/24 0435 07/18/24 1456 07/19/24 0416  NA 137   < > 139 143 144 143 146*  K 4.8   < > 4.6 5.1 5.3* 5.2* 5.3*  CL 90*   < > 89* 93* 92* 91* 90*  CO2 39*   < > 43* 44* >45* >45* >45*  GLUCOSE 173*   < > 132* 142* 157* 178* 154*  BUN 18   < > 47* 40* 34* 34* 33*  CREATININE 1.14   < > 1.25* 1.01 1.05 1.03 1.06  CALCIUM  8.7*   < > 9.0 9.4 9.3 9.3 9.2  MG 1.9  --   --   --   --   --   --    < > = values in this interval not displayed.   GFR: Estimated Creatinine Clearance: 103.1 mL/min (by C-G formula  based on SCr of 1.06 mg/dL). Liver Function Tests: No  results for input(s): AST, ALT, ALKPHOS, BILITOT, PROT, ALBUMIN in the last 168 hours. No results for input(s): LIPASE, AMYLASE in the last 168 hours. No results for input(s): AMMONIA in the last 168 hours. Coagulation Profile: No results for input(s): INR, PROTIME in the last 168 hours. Cardiac Enzymes: No results for input(s): CKTOTAL, CKMB, CKMBINDEX, TROPONINI in the last 168 hours. BNP (last 3 results) Recent Labs    07/13/24 1835  PROBNP 527.0*   HbA1C: No results for input(s): HGBA1C in the last 72 hours. CBG: Recent Labs  Lab 07/16/24 2118 07/17/24 0949 07/17/24 1007  GLUCAP 214* 185* 127*   Lipid Profile: No results for input(s): CHOL, HDL, LDLCALC, TRIG, CHOLHDL, LDLDIRECT in the last 72 hours. Thyroid  Function Tests: No results for input(s): TSH, T4TOTAL, FREET4, T3FREE, THYROIDAB in the last 72 hours. Anemia Panel: No results for input(s): VITAMINB12, FOLATE, FERRITIN, TIBC, IRON, RETICCTPCT in the last 72 hours. Most Recent Urinalysis On File:     Component Value Date/Time   COLORURINE AMBER (A) 11/25/2017 1618   APPEARANCEUR CLEAR (A) 11/25/2017 1618   LABSPEC 1.028 11/25/2017 1618   PHURINE 5.0 11/25/2017 1618   GLUCOSEU NEGATIVE 11/25/2017 1618   HGBUR SMALL (A) 11/25/2017 1618   BILIRUBINUR negative 10/21/2022 1103   KETONESUR NEGATIVE 11/25/2017 1618   PROTEINUR Positive (A) 10/21/2022 1103   PROTEINUR 30 (A) 11/25/2017 1618   UROBILINOGEN 0.2 10/21/2022 1103   NITRITE negative 10/21/2022 1103   NITRITE NEGATIVE 11/25/2017 1618   LEUKOCYTESUR Moderate (2+) (A) 10/21/2022 1103   Sepsis Labs: @LABRCNTIP (procalcitonin:4,lacticidven:4) Microbiology: Recent Results (from the past 240 hours)  Resp panel by RT-PCR (RSV, Flu A&B, Covid) Anterior Nasal Swab     Status: None   Collection Time: 07/13/24  6:47 PM   Specimen: Anterior Nasal Swab  Result Value Ref Range Status   SARS  Coronavirus 2 by RT PCR NEGATIVE NEGATIVE Final    Comment: (NOTE) SARS-CoV-2 target nucleic acids are NOT DETECTED.  The SARS-CoV-2 RNA is generally detectable in upper respiratory specimens during the acute phase of infection. The lowest concentration of SARS-CoV-2 viral copies this assay can detect is 138 copies/mL. A negative result does not preclude SARS-Cov-2 infection and should not be used as the sole basis for treatment or other patient management decisions. A negative result may occur with  improper specimen collection/handling, submission of specimen other than nasopharyngeal swab, presence of viral mutation(s) within the areas targeted by this assay, and inadequate number of viral copies(<138 copies/mL). A negative result must be combined with clinical observations, patient history, and epidemiological information. The expected result is Negative.  Fact Sheet for Patients:  bloggercourse.com  Fact Sheet for Healthcare Providers:  seriousbroker.it  This test is no t yet approved or cleared by the [place]  FDA and  has been authorized for detection and/or diagnosis of SARS-CoV-2 by FDA under an Emergency Use Authorization (EUA). This EUA will remain  in effect (meaning this test can be used) for the duration of the COVID-19 declaration under Section 564(b)(1) of the Act, 21 U.S.C.section 360bbb-3(b)(1), unless the authorization is terminated  or revoked sooner.       Influenza A by PCR NEGATIVE NEGATIVE Final   Influenza B by PCR NEGATIVE NEGATIVE Final    Comment: (NOTE) The Xpert Xpress SARS-CoV-2/FLU/RSV plus assay is intended as an aid in the diagnosis of influenza from Nasopharyngeal swab specimens and should not be used as a sole basis  for treatment. Nasal washings and aspirates are unacceptable for Xpert Xpress SARS-CoV-2/FLU/RSV testing.  Fact Sheet for  Patients: bloggercourse.com  Fact Sheet for Healthcare Providers: seriousbroker.it  This test is not yet approved or cleared by the [place]  FDA and has been authorized for detection and/or diagnosis of SARS-CoV-2 by FDA under an Emergency Use Authorization (EUA). This EUA will remain in effect (meaning this test can be used) for the duration of the COVID-19 declaration under Section 564(b)(1) of the Act, 21 U.S.C. section 360bbb-3(b)(1), unless the authorization is terminated or revoked.     Resp Syncytial Virus by PCR NEGATIVE NEGATIVE Final    Comment: (NOTE) Fact Sheet for Patients: bloggercourse.com  Fact Sheet for Healthcare Providers: seriousbroker.it  This test is not yet approved or cleared by the [place]  FDA and has been authorized for detection and/or diagnosis of SARS-CoV-2 by FDA under an Emergency Use Authorization (EUA). This EUA will remain in effect (meaning this test can be used) for the duration of the COVID-19 declaration under Section 564(b)(1) of the Act, 21 U.S.C. section 360bbb-3(b)(1), unless the authorization is terminated or revoked.  Performed at [facility], [address] [place], [place] [address]   Respiratory (~20 pathogens) panel by PCR     Status: None   Collection Time: 07/13/24 10:12 PM   Specimen: Nasopharyngeal Swab; Respiratory  Result Value Ref Range Status   Adenovirus NOT DETECTED NOT DETECTED Final   Coronavirus 229E NOT DETECTED NOT DETECTED Final    Comment: (NOTE) The Coronavirus on the Respiratory Panel, DOES NOT test for the novel  Coronavirus (2019 nCoV)    Coronavirus HKU1 NOT DETECTED NOT DETECTED Final   Coronavirus NL63 NOT DETECTED NOT DETECTED Final   Coronavirus OC43 NOT DETECTED NOT DETECTED Final   Metapneumovirus NOT DETECTED NOT DETECTED Final   Rhinovirus / Enterovirus NOT DETECTED NOT  DETECTED Final   Influenza A NOT DETECTED NOT DETECTED Final   Influenza B NOT DETECTED NOT DETECTED Final   Parainfluenza Virus 1 NOT DETECTED NOT DETECTED Final   Parainfluenza Virus 2 NOT DETECTED NOT DETECTED Final   Parainfluenza Virus 3 NOT DETECTED NOT DETECTED Final   Parainfluenza Virus 4 NOT DETECTED NOT DETECTED Final   Respiratory Syncytial Virus NOT DETECTED NOT DETECTED Final   Bordetella pertussis NOT DETECTED NOT DETECTED Final   Bordetella Parapertussis NOT DETECTED NOT DETECTED Final   Chlamydophila pneumoniae NOT DETECTED NOT DETECTED Final   Mycoplasma pneumoniae NOT DETECTED NOT DETECTED Final    Comment: Performed at [facility], [address] [place], [place] [address]  Culture, blood (Routine X 2) w Reflex to ID Panel     Status: None (Preliminary result)   Collection Time: 07/17/24 12:03 PM   Specimen: BLOOD  Result Value Ref Range Status   Specimen Description BLOOD BLOOD RIGHT HAND  Final   Special Requests   Final    AEROBIC BOTTLE ONLY Blood Culture results may not be optimal due to an inadequate volume of blood received in culture bottles   Culture   Final    NO GROWTH 1 DAY Performed at [facility], [address], [place], [place] [address]    Report Status PENDING  Incomplete  Culture, blood (Routine X 2) w Reflex to ID Panel     Status: None (Preliminary result)   Collection Time: 07/17/24 12:07 PM   Specimen: BLOOD  Result Value Ref Range Status   Specimen Description BLOOD BLOOD LEFT HAND  Final  Special Requests   Final    AEROBIC BOTTLE ONLY Blood Culture results may not be optimal due to an inadequate volume of blood received in culture bottles   Culture   Final    NO GROWTH 1 DAY Performed at [facility] [address] [place], [place] [address]    Report Status PENDING  Incomplete      Radiology Studies last 3 days: CT Angio Chest Pulmonary Embolism (PE) W or WO Contrast Result Date: 07/17/2024 EXAM:  CTA of the Chest with and without contrast for PE 07/17/2024 10:26:40 AM TECHNIQUE: CTA of the chest was performed after the administration of intravenous contrast. Multiplanar reformatted images are provided for review. MIP images are provided for review. Automated exposure control, iterative reconstruction, and/or weight based adjustment of the mA/kV was utilized to reduce the radiation dose to as low as reasonably achievable. CONTRAST: 75 mL iohexol  (OMNIPAQUE ) 350 MG/ML injection. COMPARISON: CTA chest 01/29/2023. CLINICAL HISTORY: 59 year old male with persistent hypoxia and inconsistent blood thinner use at home. FINDINGS: PULMONARY ARTERIES: Suboptimal but adequate pulmonary artery contrast timing. No convincing pulmonary artery filling defect. Main pulmonary artery is normal in caliber. Some pulmonary artery detail limited by motion, especially in the Right upper lobe. No convincing pulmonary artery filling defect. MEDIASTINUM: The heart and pericardium demonstrate no acute abnormality. Calcified coronary artery and aortic atherosclerosis. There is no acute abnormality of the thoracic aorta. LYMPH NODES: Mediastinal lymph nodes are stable since 2024, likely reactive. LUNGS AND PLEURA: Mild respiratory motion. Sabre sheath trachea configuration suggesting chronic obstructive pulmonary disease (series 8 image 33). Atelectatic changes to the airways and some retained secretions also suspected in the Right mainstem bronchus. Superimposed generalized central bronchial wall thickening bilaterally. Tree in bud nodular opacity scattered in the Right upper lobe, throughout the Right lower lobe, and superimposed confluent peribronchial opacity in the Lingula, Posterior basal segment of the Right lower lobe, and Medial segment of the Right middle lobe. This more resembles developing consolidation than atelectasis. No pleural effusion. No pneumothorax. UPPER ABDOMEN: Similar mild contrast reflux from the right atrium  into the hepatic IVC and hepatic veins. Negative visible other non-contrast upper abdominal viscera. SOFT TISSUES AND BONES: No acute bone or soft tissue abnormality. IMPRESSION: 1. No convincing pulmonary embolus; evaluation limited by motion and suboptimal contrast timing. 2. Evidence of COPD with superimposed generalized bronchitis, and bilateral respiratory infection with multifocal early consolidation. No pleural effusion. Electronically signed by: Helayne Hurst MD MD 07/17/2024 10:55 AM EST RP Workstation: [id number]   ECHOCARDIOGRAM COMPLETE Result Date: 07/16/2024    ECHOCARDIOGRAM REPORT   Patient Name:   Ryan Hatfield [facility] Date of Exam: 07/16/2024 Medical Rec #:  [id number]         Height:       69.0 in Accession #:    [id number]        Weight:       308.6 lb Date of Birth:  06-12-1966         BSA:          2.484 m Patient Age:    58 years          BP:           105/61 mmHg Patient Gender: M                 HR:           84 bpm. Exam Location:  [facility] Procedure: 2D Echo, Cardiac Doppler, Color Doppler and Intracardiac  Opacification Agent (Both Spectral and Color Flow Doppler were            utilized during procedure). Indications:     CHF I50.31  History:         Patient has prior history of Echocardiogram examinations, most                  recent 05/05/2023.  Sonographer:     Thedora Louder RDCS, FASE Referring Phys:  [id number] LANETA BLUNT Diagnosing Phys: Annabella Scarce MD  Sonographer Comments: Technically difficult study due to poor echo windows, suboptimal apical window and patient is obese. Image acquisition challenging due to patient body habitus and Image acquisition challenging due to respiratory motion. The patient was on 12L of oxygen  at the time of this study. IMPRESSIONS  1. Left ventricular ejection fraction, by estimation, is 60 to 65%. Left ventricular ejection fraction by PLAX is 63 %. The left ventricle has normal function. The left ventricle has no regional wall motion  abnormalities. There is moderate concentric left ventricular hypertrophy. Left ventricular diastolic parameters are indeterminate.  2. Right ventricular systolic function is normal. The right ventricular size is normal.  3. The mitral valve is normal in structure. No evidence of mitral valve regurgitation. No evidence of mitral stenosis.  4. The aortic valve is tricuspid. Aortic valve regurgitation is not visualized. No aortic stenosis is present.  5. The inferior vena cava is dilated in size with >50% respiratory variability, suggesting right atrial pressure of 8 mmHg. FINDINGS  Left Ventricle: Left ventricular ejection fraction, by estimation, is 60 to 65%. Left ventricular ejection fraction by PLAX is 63 %. The left ventricle has normal function. The left ventricle has no regional wall motion abnormalities. Definity  contrast agent was given IV to delineate the left ventricular endocardial borders. The left ventricular internal cavity size was normal in size. There is moderate concentric left ventricular hypertrophy. Left ventricular diastolic function could not be evaluated due to atrial fibrillation. Left ventricular diastolic parameters are indeterminate. Normal left ventricular filling pressure. Right Ventricle: The right ventricular size is normal. No increase in right ventricular wall thickness. Right ventricular systolic function is normal. Left Atrium: Left atrial size was normal in size. Right Atrium: Right atrial size was normal in size. Pericardium: There is no evidence of pericardial effusion. Mitral Valve: The mitral valve is normal in structure. Mild mitral annular calcification. No evidence of mitral valve regurgitation. No evidence of mitral valve stenosis. Tricuspid Valve: The tricuspid valve is normal in structure. Tricuspid valve regurgitation is not demonstrated. No evidence of tricuspid stenosis. Aortic Valve: The aortic valve is tricuspid. Aortic valve regurgitation is not visualized. No  aortic stenosis is present. Aortic valve peak gradient measures 0.9 mmHg. Pulmonic Valve: The pulmonic valve was normal in structure. Pulmonic valve regurgitation is not visualized. No evidence of pulmonic stenosis. Aorta: The aortic root is normal in size and structure. Venous: The inferior vena cava is dilated in size with greater than 50% respiratory variability, suggesting right atrial pressure of 8 mmHg. IAS/Shunts: No atrial level shunt detected by color flow Doppler.  LEFT VENTRICLE PLAX 2D LV EF:         Left            Diastology                ventricular     LV e' medial:    11.40 cm/s  ejection        LV E/e' medial:  9.4                fraction by     LV e' lateral:   9.36 cm/s                PLAX is 63      LV E/e' lateral: 11.4                %. LVIDd:         4.45 cm LVIDs:         2.95 cm LV PW:         1.40 cm LV IVS:        1.30 cm LVOT diam:     2.40 cm LVOT Area:     4.52 cm  RIGHT VENTRICLE RV Basal diam:  3.00 cm RV S prime:     11.40 cm/s TAPSE (M-mode): 1.8 cm LEFT ATRIUM             Index        RIGHT ATRIUM           Index LA diam:        3.90 cm 1.57 cm/m   RA Area:     19.80 cm LA Vol (A2C):   49.3 ml 19.84 ml/m  RA Volume:   54.30 ml  21.86 ml/m LA Vol (A4C):   34.9 ml 14.05 ml/m LA Biplane Vol: 45.6 ml 18.35 ml/m  AORTIC VALVE             PULMONIC VALVE AV Vmax:      47.10 cm/s PV Vmax:        1.14 m/s AV Peak Grad: 0.9 mmHg   PV Peak grad:   5.2 mmHg                          RVOT Peak grad: 5 mmHg AORTA Ao Root diam: 3.20 cm Ao Asc diam:  3.10 cm MITRAL VALVE MV Area (PHT): 3.30 cm     SHUNTS MV Decel Time: 230 msec     Systemic Diam: 2.40 cm MV E velocity: 107.00 cm/s Annabella Scarce MD Electronically signed by Annabella Scarce MD Signature Date/Time: 07/16/2024/1:12:06 PM    Final      CRITICAL CARE Performed by: Laneta Blunt   Total critical care time: 30 minutes  Critical care time was exclusive of separately billable procedures and treating  other patients.  Critical care was necessary to treat or prevent imminent or life-threatening deterioration.  Critical care was time spent personally by me on the following activities: development of treatment plan with patient and/or surrogate as well as nursing, discussions with consultants, evaluation of patient's response to treatment, examination of patient, obtaining history from patient or surrogate, ordering and performing treatments and interventions, ordering and review of laboratory studies, ordering and review of radiographic studies, pulse oximetry and re-evaluation of patient's condition.     Krisinda Giovanni, DO [facility] 07/19/2024, 8:01 AM    Dictation software may have been used to generate the above note. Typos may occur and escape review in typed/dictated notes. Please contact Dr Blunt directly for clarity if needed.  Staff may message me via secure chat in Epic  but this may not receive an immediate response,  please page me for urgent matters!  If 7PM-7AM, please contact night coverage [email or web address]       "

## 2024-07-19 NOTE — Consult Note (Signed)
 "  NAME:  Ryan Hatfield, MRN:  [id number], DOB:  [birth year], LOS: 6 ADMISSION DATE:  07/13/2024, CONSULTATION DATE: 07/19/2024  REFERRING MD: Dr. Marsa, CHIEF COMPLAINT: Altered Mental Status   History of Present Illness:  This is a 59 yo male who presented to [facility] ER on 01/07 from home via EMS with c/o shortness of breath, cough, and chest discomfort onset 2 days prior to presentation.  Pt wears 2L O2 via nasal canula at baseline.  EMS reported when they arrived at pts home his O2 sats were 76% on 2L O2.  He was placed on 8L/NRB and O2 sats increased to 95%.  He reported to EDP due to insurance issues he does not have any inhalers or nebulizer's at home.   ED Course  Upon arrival to the ER per ED notes pt noted to have increased work of breathing and hypoxia, therefore he was placed on Bipap.  CXR concerning for pneumonia, but revealed mild cardiomegaly and bibasilar atelectasis.  Influenza A&B/COVID/RSV negative.  Significant lab results were: chloride 91/CO2 37/glucose 114/BNP 527.0/troponin 20.  Pt admitted to the telemetry unit with additional workup and treatment.  See detailed hospital course below for additional workup and treatment.    Pertinent  Medical History  Alcohol Abuse in Remission  Anxiety  Depression  Insomnia  Degenerative Arthritis   Significant Hospital Events: Including procedures, antibiotic start and stop dates in addition to other pertinent events   01/07: Pt admitted to the telemetry unit with acute on chronic hypoxic hypercapnic respiratory failure secondary AECOPD and acute on chronic diastolic CHF  01/08: BiPAP overnight w/ improvement in Afib rate. Off BiPAP today per patient request, remains on high flow [place]. Continue diuresis  01/09: HFNC 11 L/min, no BiPAP overnight. UOP yesterday -1500 mL. Back into Afib RVR,         dilt bolus ineffective, starting drip, Echo ordered. D/w cardiology, plan to see him tomorrow  01/10: Cardiology following --> dc amiodarone   (pt now notes spotty compliance w/ home          Eliquis ), continue diltiazem  infusion, changed to metoprolol  tartrate 37.5 mg q6h, holding         lasix  for today. Echo EF 60-65 mod LVH indeterminate diastolic  01/11: Consolidate diltiazem  to 360mg  daily and metoprolol  75mg  bid. Still on high low [place] 13L/[place] CT chest, no PE, (+)concern pneumonia so started abx, later up to [facility] 50L/min. Will ask pulmonary to review CT, question may need bronch  01/12: Hypercarbic, on BiPAP overnight and back on HHFNC through the day, repeating BMP this afternoon. CO2 narcosis mild confusion  01/13: More confused/agitated overnight, unresponsive this morning, transferring to ICU        Due to HIGH RISK FOR INTUBATION.  PCCM team consulted    Interim History / Subjective:  Pt arrived to ICU on HHFNC @40 % lethargic and confused to place/situation.  Pts ABG revealed severe metabolic alkalosis.  Lasix  discontinued and 500 mg iv diamox  administered   Objective    Blood pressure (!) 161/94, pulse 86, temperature 97.9 F (36.6 C), temperature source Axillary, resp. rate 18, height 5' 9 (1.753 m), weight 128 kg, SpO2 95%.    FiO2 (%):  [40 %-60 %] 40 %   Intake/Output Summary (Last 24 hours) at 07/19/2024 1340 Last data filed at 07/19/2024 1230 Gross per 24 hour  Intake --  Output 1400 ml  Net -1400 ml   Filed Weights   07/13/24 1834 07/13/24 1904 07/19/24 0849  Weight: (!) 137 kg (!) 140 kg 128 kg    Examination: General: Acute on chronically-ill appearing male, in respiratory distress on HHFNC  HENT: Supple, difficult to assess for JVD due to body habitus  Lungs: Diminished with expiratory throughout, tachypnea with mild accessory muscle use  Cardiovascular: NSR, s1s2, no m/r/g, 2+ radial/1+ distal, 2+ generalized edema  Abdomen: +BS x4 , obese, non tender, mildly distended, taut  Extremities: Normal bulk and tone, moves all extremities  Neuro: Lethargic but easily arousable, oriented to self and  time, follows commands, PERRLA GU: External catheter draining yellow urine   Resolved problem list   Assessment and Plan  #Acute toxic metabolic encephalopathy  #ETOH Abuse  - Correct metabolic derangements  - CIWA protocol q6hrs  - High dose thiamine  x3 days and folic acid   - Avoid sedating medications as able   #Acute on chronic diastolic CHF exacerbation  #Elevated troponin secondary to demand ischemia  Hx: HTN  Echo 07/15/24: EF 60 to 65%, moderate concentric left ventricular hypertrophy, and mild mitral annular calcification  CTA Chest 07/17/24:  - Continuous telemetry monitoring  - Lasix  stopped due to severe metabolic alkalosis and 500 mg iv diamox  administered with good response  - Continue atorvastatin , cardizem , metoprolol , and xarelto     #Acute on chronic hypoxic hypercapnic respiratory failure  #OHS/OSA  #Morbid obesity  #Possible pneumonia  - Supplemental O2 for dyspnea and/or hypoxia  - Pt will need Bipap at bedtime and prn with naps even at discharge  - Maintain O2 sats 88% to 92% - Scheduled and prn bronchodilatory therapy - IV steroids - Smoking cessation counseling provided  - Continue nicotine  patch  - Prn CXR's and ABG's  - Trend WBC and monitor fever curve  - Follow cultures  - Continue CAP coverage  - Pulmonary hygiene as able   #Hypernatremia  #Hyperkalemia  #Severe metabolic alkalosis suspect secondary to over diuresis - Trend BMP  - Replace electrolytes as indicated  - Strict I&O's - Avoid nephrotoxic agents as able  - Will discontinue scheduled lasix  for now   #Hyperglycemia  - Hemoglobin A1c pending - SSI  - CBG's ac/hs  - Follow hypo/hyperglycemia protocol  - Target CBG readings 140 to 180  01/13: Pt and pts daughter updated regarding pts condition and current plan of care.  All questions were answered Labs   CBC: Recent Labs  Lab 07/13/24 1835 07/14/24 0448 07/16/24 0533 07/18/24 0435  WBC 10.4 9.5 17.7* 18.9*  NEUTROABS  8.9*  --   --   --   HGB 14.3 14.3 13.9 14.4  HCT 44.5 44.5 43.7 49.2  MCV 92.9 93.5 93.8 99.0  PLT 198 198 208 265    Basic Metabolic Panel: Recent Labs  Lab 07/14/24 0448 07/15/24 0439 07/16/24 0533 07/17/24 0506 07/18/24 0435 07/18/24 1456 07/19/24 0416  NA 137   < > 139 143 144 143 146*  K 4.8   < > 4.6 5.1 5.3* 5.2* 5.3*  CL 90*   < > 89* 93* 92* 91* 90*  CO2 39*   < > 43* 44* >45* >45* >45*  GLUCOSE 173*   < > 132* 142* 157* 178* 154*  BUN 18   < > 47* 40* 34* 34* 33*  CREATININE 1.14   < > 1.25* 1.01 1.05 1.03 1.06  CALCIUM  8.7*   < > 9.0 9.4 9.3 9.3 9.2  MG 1.9  --   --   --   --   --   --    < > =  values in this interval not displayed.   GFR: Estimated Creatinine Clearance: 98.1 mL/min (by C-G formula based on SCr of 1.06 mg/dL). Recent Labs  Lab 07/13/24 1835 07/14/24 0448 07/16/24 0533 07/18/24 0435  WBC 10.4 9.5 17.7* 18.9*    Liver Function Tests: No results for input(s): AST, ALT, ALKPHOS, BILITOT, PROT, ALBUMIN in the last 168 hours. No results for input(s): LIPASE, AMYLASE in the last 168 hours. No results for input(s): AMMONIA in the last 168 hours.  ABG    Component Value Date/Time   PHART 7.4 07/19/2024 1200   PCO2ART 90 (HH) 07/19/2024 1200   PO2ART 75 (L) 07/19/2024 1200   HCO3 55.7 (H) 07/19/2024 1200   TCO2 46 (H) 02/02/2023 0831   O2SAT 95.5 07/19/2024 1200     Coagulation Profile: No results for input(s): INR, PROTIME in the last 168 hours.  Cardiac Enzymes: No results for input(s): CKTOTAL, CKMB, CKMBINDEX, TROPONINI in the last 168 hours.  HbA1C: Hgb A1c MFr Bld  Date/Time Value Ref Range Status  08/07/2021 11:22 AM 5.4 4.6 - 6.5 % Final    Comment:    Glycemic Control Guidelines for People with Diabetes:Non Diabetic:  <6%Goal of Therapy: <7%Additional Action Suggested:  >8%   08/30/2019 09:14 AM 5.9 4.6 - 6.5 % Final    Comment:    Glycemic Control Guidelines for People with Diabetes:Non  Diabetic:  <6%Goal of Therapy: <7%Additional Action Suggested:  >8%     CBG: Recent Labs  Lab 07/16/24 2118 07/17/24 0949 07/17/24 1007 07/19/24 0846  GLUCAP 214* 185* 127* 191*    Review of Systems: Positives   Gen: Denies fever, chills, weight change, fatigue, night sweats HEENT: Denies blurred vision, double vision, hearing loss, tinnitus, sinus congestion, rhinorrhea, sore throat, neck stiffness, dysphagia PULM: shortness of breath, cough, sputum production, hemoptysis, wheezing CV: chest pain, edema, orthopnea, paroxysmal nocturnal dyspnea, palpitations GI: Denies abdominal pain, nausea, vomiting, diarrhea, hematochezia, melena, constipation, change in bowel habits GU: Denies dysuria, hematuria, polyuria, oliguria, urethral discharge Endocrine: Denies hot or cold intolerance, polyuria, polyphagia or appetite change Derm: Denies rash, dry skin, scaling or peeling skin change Heme: Denies easy bruising, bleeding, bleeding gums Neuro: Denies headache, numbness, weakness, slurred speech, loss of memory or consciousness  Past Medical History:  He,  has a past medical history of Acute appendicitis, Alcohol abuse, in remission (05/29/2015), Anxiety and depression (05/29/2015), Arthritis, degenerative (02/03/2013), Cannot sleep (02/03/2013), Chest pain (01/15/2013), Hypertension, and Plantar fasciitis of left foot (01/17/2016).   Surgical History:   Past Surgical History:  Procedure Laterality Date   CARDIOVERSION N/A 02/04/2023   Procedure: CARDIOVERSION;  Surgeon: Kate Lonni CROME, MD;  Location: [facility] INVASIVE CV LAB;  Service: Cardiovascular;  Laterality: N/A;   LAPAROSCOPIC APPENDECTOMY N/A 11/25/2017   Procedure: APPENDECTOMY LAPAROSCOPIC;  Surgeon: Wonda Charlie BRAVO, MD;  Location: [facility] ORS;  Service: General;  Laterality: N/A;   RIGHT/LEFT HEART CATH AND CORONARY ANGIOGRAPHY N/A 02/02/2023   Procedure: RIGHT/LEFT HEART CATH AND CORONARY ANGIOGRAPHY;  Surgeon: Burnard Debby LABOR,  MD;  Location: [facility] INVASIVE CV LAB;  Service: Cardiovascular;  Laterality: N/A;   SEPTOPLASTY     TEE WITHOUT CARDIOVERSION N/A 02/04/2023   Procedure: TRANSESOPHAGEAL ECHOCARDIOGRAM;  Surgeon: Kate Lonni CROME, MD;  Location: [facility] INVASIVE CV LAB;  Service: Cardiovascular;  Laterality: N/A;   TONSILLECTOMY       Social History:   reports that he has been smoking cigarettes. He has a 40 pack-year smoking history. He has never used smokeless tobacco. He reports  current alcohol use. He reports that he does not use drugs.   Family History:  His family history includes Arthritis in his mother; Cancer in his maternal grandmother; Diabetes in his brother; Heart disease in his father and mother; Kidney cancer in his paternal uncle; Prostate cancer in his paternal grandfather; Stroke in his brother.   Allergies Allergies[1]   Home Medications  Prior to Admission medications  Medication Sig Start Date End Date Taking? Authorizing Provider  folic acid  (FOLVITE ) 1 MG tablet Take 1 tablet (1 mg total) by mouth daily. 02/05/23  Yes Pokhrel, Laxman, MD  furosemide  (LASIX ) 40 MG tablet Take 1 tablet (40 mg total) by mouth daily. 02/17/24 02/16/25 Yes Wittenborn, Barnie, NP  isosorbide  mononitrate (IMDUR ) 30 MG 24 hr tablet TAKE 1/2 OF A TABLET (15 MG TOTAL) BY MOUTH DAILY Patient taking differently: Take 30 mg by mouth daily. 04/14/24  Yes Wittenborn, Barnie, NP  levothyroxine  (SYNTHROID ) 50 MCG tablet TAKE 1 TABLET BY MOUTH EVERY DAY 10/12/23  Yes Dugal, Tabitha, FNP  metoprolol  succinate (TOPROL -XL) 50 MG 24 hr tablet Take 3 tablets (150 mg total) by mouth daily. Take with or immediately following a meal. 05/31/24  Yes Wittenborn, Deborah, NP  nicotine  (NICODERM CQ  - DOSED IN MG/24 HOURS) 14 mg/24hr patch Place 1 patch (14 mg total) onto the skin daily. Patient taking differently: Place 14 mg onto the skin daily as needed (for smoking cessation). 04/02/23  Yes Dugal, Tabitha, FNP  nitroGLYCERIN   (NITROSTAT ) 0.4 MG SL tablet Place 1 tablet (0.4 mg total) under the tongue every 5 (five) minutes as needed for chest pain. 02/17/24 07/13/24 Yes Wittenborn, Deborah, NP  omeprazole  (PRILOSEC) 20 MG capsule TAKE 1 CAPSULE BY MOUTH EVERY DAY 04/06/24  Yes Wittenborn, Information Systems Manager, NP  albuterol  (VENTOLIN  HFA) 108 (90 Base) MCG/ACT inhaler TAKE 2 PUFFS BY MOUTH EVERY 6 HOURS AS NEEDED FOR WHEEZE OR SHORTNESS OF BREATH Patient not taking: Reported on 07/13/2024 08/12/23   Malka Domino, MD  apixaban  (ELIQUIS ) 5 MG TABS tablet Take 1 tablet (5 mg total) by mouth 2 (two) times daily. Patient not taking: Reported on 07/13/2024 02/17/24 02/16/25  Loistine Barnie, NP  atorvastatin  (LIPITOR) 40 MG tablet Take 1 tablet (40 mg total) by mouth daily. Patient not taking: Reported on 07/13/2024 09/26/22   Dugal, Tabitha, FNP  fluticasone  furoate-vilanterol (BREO ELLIPTA ) 200-25 MCG/ACT AEPB Inhale 1 puff into the lungs daily. Patient not taking: Reported on 07/13/2024 06/22/24   Malka Domino, MD  gabapentin  (NEURONTIN ) 100 MG capsule Take 1 capsule (100 mg total) by mouth 3 (three) times daily. Patient not taking: Reported on 07/13/2024 04/02/23   Corwin Antu, FNP     Critical care time: 65 minutes      Lonell Moose, AGNP  Pulmonary/Critical Care Pager [phone] (please enter 7 digits) PCCM Consult Pager [phone] (please enter 7 digits)          [1]  Allergies Allergen Reactions   Lisinopril  Cough   Paroxetine     REACTION: paranoia, confusion   Sertraline Hcl     REACTION: paranoia, confusion   "

## 2024-07-19 NOTE — Progress Notes (Signed)
"  °  Progress Note  Patient Name: Ryan Hatfield Date of Encounter: 07/19/2024 [facility] Cardiologist: Redell Cave, MD   Interval Summary   Patient reports that his breathing is a little bit better today.  No chest pain or palpitations.  He was transferred from telemetry to stepdown this morning due to difficulty being aroused.  He had been restless overnight and received lorazepam .  He does not recall the specific events.  Vital Signs Vitals:   07/19/24 1200 07/19/24 1400 07/19/24 1500 07/19/24 1600  BP: (!) 161/94 (!) 137/58 94/77 (!) 140/68  Pulse: 86 85 (!) 156 83  Resp: 18 (!) 23 18 20   Temp: 97.9 F (36.6 C)   98.3 F (36.8 C)  TempSrc: Axillary   Axillary  SpO2: 95% 91% 90% 93%  Weight:      Height:        Intake/Output Summary (Last 24 hours) at 07/19/2024 1705 Last data filed at 07/19/2024 1507 Gross per 24 hour  Intake 114.67 ml  Output 1400 ml  Net -1285.33 ml      07/19/2024    8:49 AM 07/13/2024    7:04 PM 07/13/2024    6:34 PM  Last 3 Weights  Weight (lbs) 282 lb 3 oz 308 lb 10.3 oz 302 lb 0.5 oz  Weight (kg) 128 kg 140 kg 137 kg      Telemetry/ECG  Atrial fibrillation with ventricular rate 90-130 bpm - Personally Reviewed  Physical Exam  GEN: No acute distress.  High flow nasal cannula in place. Neck: Unable to assess JVP due to body habitus and facial hair. Cardiac: Distant heart sounds.  Irregularly irregular without murmurs. Respiratory: Bilateral wheezes and rhonchi with fair air movement. GI: Soft, nontender, non-distended  MS: No edema  Assessment & Plan  Persistent atrial fibrillation: Ventricular rates are improving, likely exacerbated in the setting of acute on chronic respiratory failure with hypoxia and hypercapnia due to COPD exacerbation.  Continue rate control with diltiazem  and metoprolol .  Patient switched from apixaban  to rivaroxaban  for stroke prophylaxis to help improve compliance.  Acute on chronic respiratory  failure with hypoxia and hypercapnia due to COPD exacerbation: Patient on high flow nasal cannula.  Feels slightly better.  Ongoing management per primary team.  Acute on chronic HFpEF: Difficult to assess volume status due to morbid obesity.  No frank edema noted on exam.  EF normal by echo.  Recommend maintaining net even fluid balance.  Coronary artery disease and elevated troponin: Catheterization in 01/2023 showed nonobstructive CAD.  No angina reported.  Suspect negligible troponin elevation most likely due to supply-demand mismatch.  For questions or updates, please contact [facility] Please consult [email or web address] for contact info under [facility] cardiology.  Signed, Lonni Hanson, MD   "

## 2024-07-19 NOTE — Progress Notes (Signed)
 Patient is not alert and only withdraws to sternal rub. MD at bedside. New orders to transfer to ICU.

## 2024-07-19 NOTE — Progress Notes (Signed)
 SPIRITUAL CARE AND COUNSELING CONSULT NOTE   VISIT SUMMARY Chaplain was asked by staff to visit this patient while she was on the Unit to address spiritual/theological concerns that staff heard patient mention.    SPIRITUAL ENCOUNTER                                                                                                                                                                      Type of Visit: Initial Care provided to:: Patient Conversation partners present during encounter: Nurse Referral source: Other (comment) Reason for visit: Routine spiritual support OnCall Visit: Yes   SPIRITUAL FRAMEWORK  Presenting Themes: Impactful experiences and emotions, Community and relationships, Values and beliefs Community/Connection: Family Patient Stress Factors: Family relationships Family Stress Factors: None identified   GOALS       INTERVENTIONS   Spiritual Care Interventions Made: Compassionate presence, Reflective listening, Narrative/life review, Prayer    INTERVENTION OUTCOMES   Outcomes: Connection to spiritual care, Reduced anxiety  SPIRITUAL CARE PLAN        If immediate needs arise, please contact [facility] 24 hour on call [phone]   Rana Davis  07/19/2024 8:42 PM

## 2024-07-19 NOTE — Progress Notes (Signed)
 Patient remained anxious and agitated throughout the evening. Patient removed 2 IV's, removed tele monitor repeatedly, removed pure wick, getting OOB unassisted, was aggressive with staff insisting on put his clothing on.  N. Donati-Garmon notified, Ativan  0.5 mg IV one time and a recruitment consultant. Patients pastor visited earlier in the evening who stated that he thought he may had been drinking again, however he was not sure when his last drink may have been. Safety sitter remains in place.

## 2024-07-19 NOTE — Plan of Care (Signed)
" °  Problem: Education: Goal: Knowledge of disease or condition will improve Outcome: Not Progressing Note: Patient has no safety awareness   Problem: Nutrition: Goal: Adequate nutrition will be maintained Outcome: Progressing   Problem: Elimination: Goal: Will not experience complications related to bowel motility Outcome: Progressing   Problem: Pain Managment: Goal: General experience of comfort will improve and/or be controlled Outcome: Progressing   Problem: Safety: Goal: Ability to remain free from injury will improve Outcome: Not Progressing Note: Patient has no safety awareness    "

## 2024-07-19 NOTE — Progress Notes (Signed)
 PT Cancellation Note  Patient Details Name: Ryan Hatfield MRN: [id number] DOB: [birth year]   Cancelled Treatment:    Reason Eval/Treat Not Completed: Patient not medically ready PT orders received, chart reviewed. Pt transferred from 239 to ICU this AM; per MD note, pt may require intubation. Will hold PT evaluation at this time & f/u as able.  Ryan Hatfield, PT, DPT 07/19/2024, 8:40 AM   Ryan Hatfield 07/19/2024, 8:39 AM

## 2024-07-19 NOTE — Plan of Care (Signed)
" °  Problem: Activity: Goal: Ability to tolerate increased activity will improve Outcome: Progressing Goal: Will verbalize the importance of balancing activity with adequate rest periods Outcome: Progressing   Problem: Respiratory: Goal: Ability to maintain a clear airway will improve Outcome: Progressing Goal: Ability to maintain adequate ventilation will improve Outcome: Progressing   Problem: Education: Goal: Knowledge of General Education information will improve Description: Including pain rating scale, medication(s)/side effects and non-pharmacologic comfort measures Outcome: Progressing   Problem: Health Behavior/Discharge Planning: Goal: Ability to manage health-related needs will improve Outcome: Progressing   "

## 2024-07-20 DIAGNOSIS — Z720 Tobacco use: Secondary | ICD-10-CM | POA: Diagnosis not present

## 2024-07-20 DIAGNOSIS — I4891 Unspecified atrial fibrillation: Secondary | ICD-10-CM | POA: Diagnosis not present

## 2024-07-20 DIAGNOSIS — I5033 Acute on chronic diastolic (congestive) heart failure: Secondary | ICD-10-CM | POA: Diagnosis not present

## 2024-07-20 DIAGNOSIS — J9621 Acute and chronic respiratory failure with hypoxia: Secondary | ICD-10-CM | POA: Diagnosis not present

## 2024-07-20 DIAGNOSIS — J441 Chronic obstructive pulmonary disease with (acute) exacerbation: Secondary | ICD-10-CM | POA: Diagnosis not present

## 2024-07-20 LAB — CBC
HCT: 49 % (ref 39.0–52.0)
Hemoglobin: 15.4 g/dL (ref 13.0–17.0)
MCH: 29.4 pg (ref 26.0–34.0)
MCHC: 31.4 g/dL (ref 30.0–36.0)
MCV: 93.5 fL (ref 80.0–100.0)
Platelets: 311 K/uL (ref 150–400)
RBC: 5.24 MIL/uL (ref 4.22–5.81)
RDW: 12 % (ref 11.5–15.5)
WBC: 20.8 K/uL — ABNORMAL HIGH (ref 4.0–10.5)
nRBC: 0 % (ref 0.0–0.2)

## 2024-07-20 LAB — BLOOD GAS, ARTERIAL
Acid-Base Excess: 12.7 mmol/L — ABNORMAL HIGH (ref 0.0–2.0)
Bicarbonate: 42.1 mmol/L — ABNORMAL HIGH (ref 20.0–28.0)
FIO2: 50 %
O2 Content: 45 L/min
O2 Saturation: 97.2 %
Patient temperature: 37
pCO2 arterial: 78 mmHg (ref 32–48)
pH, Arterial: 7.34 — ABNORMAL LOW (ref 7.35–7.45)
pO2, Arterial: 89 mmHg (ref 83–108)

## 2024-07-20 LAB — HEMOGLOBIN A1C
Hgb A1c MFr Bld: 5.8 % — ABNORMAL HIGH (ref 4.8–5.6)
Mean Plasma Glucose: 119.76 mg/dL

## 2024-07-20 LAB — BASIC METABOLIC PANEL WITH GFR
Anion gap: 7 (ref 5–15)
BUN: 34 mg/dL — ABNORMAL HIGH (ref 6–20)
CO2: 37 mmol/L — ABNORMAL HIGH (ref 22–32)
Calcium: 8.7 mg/dL — ABNORMAL LOW (ref 8.9–10.3)
Chloride: 95 mmol/L — ABNORMAL LOW (ref 98–111)
Creatinine, Ser: 1.14 mg/dL (ref 0.61–1.24)
GFR, Estimated: >60 mL/min
Glucose, Bld: 183 mg/dL — ABNORMAL HIGH (ref 70–99)
Potassium: 4.3 mmol/L (ref 3.5–5.1)
Sodium: 139 mmol/L (ref 135–145)

## 2024-07-20 LAB — GLUCOSE, CAPILLARY
Glucose-Capillary: 180 mg/dL — ABNORMAL HIGH (ref 70–99)
Glucose-Capillary: 215 mg/dL — ABNORMAL HIGH (ref 70–99)
Glucose-Capillary: 186 mg/dL — ABNORMAL HIGH (ref 70–99)
Glucose-Capillary: 210 mg/dL — ABNORMAL HIGH (ref 70–99)

## 2024-07-20 LAB — PHOSPHORUS: Phosphorus: 4 mg/dL (ref 2.5–4.6)

## 2024-07-20 LAB — STREP PNEUMONIAE URINARY ANTIGEN: Strep Pneumo Urinary Antigen: NEGATIVE

## 2024-07-20 LAB — MAGNESIUM: Magnesium: 2.7 mg/dL — ABNORMAL HIGH (ref 1.7–2.4)

## 2024-07-20 MED ORDER — PHENOBARBITAL SODIUM 130 MG/ML IJ SOLN
130.0000 mg | Freq: Once | INTRAMUSCULAR | Status: AC
  Administered 2024-07-20: 130 mg via INTRAVENOUS
  Filled 2024-07-20: qty 1

## 2024-07-20 NOTE — Progress Notes (Addendum)
 Cardiology at bedside.

## 2024-07-20 NOTE — Progress Notes (Signed)
 " PROGRESS NOTE    Ryan Hatfield  FMW:[id number] DOB: 1966/06/10 DOA: 07/13/2024 PCP: Corwin Antu, FNP  [id number]/[id number]  LOS: 7 days   Brief hospital course:   Assessment & Plan: Ryan Hatfield is a 59 y.o. male with medical history significant of dCHF, HTN, HLD, COPD on 2L oxygen , hypothyroidism, GERD, depression with anxiety, A-fib on Eliquis , morbid obesity, smoker, alcohol abuse in remission, medication noncompliance, who presents with SOB.   Patient states that he is not taking most of his medications including Eliquis  for a while due to insurance issue. (He is not able to recall some medications he's not been taking, reports not on inhalers recently, not sure about all cradiac meds but reports he's been on his eliquis ) He developed SOB in the past 2 days, which has been progressively worsening. He has cough with clear mucus production, no chest pain, fever or chills. Patient is normally using 2 L oxygen  at baseline, but was found to have oxygen  desaturation to 76% on home level 2 L oxygen , initially started 6-8 L oxygen , still has respiratory distress and difficulty speaking in full sentence, then started BiPAP with improvement.  BiPAP is weaned off now, currently on 6 L oxygen  with 100% of saturation.    01/07:  to ED. BNP 527, GFR> 60, troponin 20 --> 21, negative PCR for COVID, flu and RSV. Temperature normal, blood pressure 158/62, heart rate 101 --> 84, RR 21.  Chest x-ray showed cardiomegaly and bibasilar atelectasis.  Patient is admitted to PCU as inpatient. Needing HFNC up to 15 L/min 01/08: BiPAP overnight w/ improvement in Afib rate. Off BiPAP today per patient request, remains on high flow [place] 15 L/min. Continue diuresis. UOP yesterday -1400 mL 01/09: HFNC 11 L/min, no BiPAP overnight. UOP yesterday -1500 mL. Back into Afib RVR, dilt bolus ineffective, starting drip, Echo ordered. D/w cardiology, plan to see him tomorrow but they are aware of his case in case decompensation /  emergent cardioversion needed 01/10: Cardiology following --> dc amiodarone  (pt now notes spotty compliance w/ home Eliquis ), continue diltiazem  infusion, changed to metoprolol  tartrate 37.5 mg q6h, holding lasix  for today. Echo EF 60-65 mod LVH indeterminate diastolic 01/11: consolidate diltiazem  to 360mg  daily and metoprolol  75mg  bid. Still on high low  13L/[place] CT chest, no PE, (+)concern pneumonia so started abx, later up to [facility] 50L/min. Will ask pulmonary to review CT, question may need bronch - does not - duonebs q4h, add pulmicort  01/12: hypercarbic, on BiPAP overnight and  back on HHFNC through the day, repeating BMP this afternoon. CO2 narcosis mild confusion  01/13: more confused/agitated overnight, unresponsive this morning, transferring to ICU anticipate intubation pr potentially needs precedex + BiPAP   Acute Encephalopathy due to CO2 narcosis Hypoventilation --improved with brief BiPAP --BiPAP PRN and nightly  Acute on chronic respiratory failure with hypoxia Acute hypercarbic respiratory failure   --Likely due to combination of COPD and CHF exacerbation.   --currently on heated hf   COPD with acute exacerbation  Wheezing on exam  --cont IV solumedrol and scheduled DuoNeb   Community acquired PNA --cont ceftriaxone  and azithro   OSA Suspect may need BiPAP at bedtime long-term   Acute on chronic diastolic CHF (congestive heart failure)  Patient presenting w/ leg edema, indicating possible CHF exacerbation.  His BNP was 527 which is likely falsely low due to morbid obesity. Last echo 2024 - EF 60-65%, grade 1 diast df  07/16/2024 Echo EF 60-65 mod LVH indeterminate diastolic appears relatively  euvolemic on exam although volume status is difficult to assess given body habitus  --s/p IV lasix  --diuretics currently on hold  Afib RVR --switched from Eliquis  to Xarelto  to help with compliance --cardio consulted --cont Cardizem  --cont Lopressor    CAD  -  Nonobstructive CAD on cath 01/2023  --cont lipitor  Elevation troponin Likely demand ischemia   Essential hypertension --BP varied widely   Leukocytosis Steroid effect Monitor CBC periodically / as needed   Acquired hypothyroidism Synthroid    Tobacco abuse Nicotine  patch Did counseling about importance of quitting smoking   Class 3 obesity based on BMI: Body mass index is 45.58 kg/m.    DVT prophylaxis: On:Xarelto   Code Status: Full code  Family Communication: ex-wife updated at bedside today Level of care: Progressive Dispo:   The patient is from: home Anticipated d/c is to: to be determined Anticipated d/c date is: >3 days   Subjective and Interval History:  Pt's mental status improved today.  Pt said he didn't feel short of breath.   Objective: Vitals:   07/20/24 1910 07/20/24 2000 07/20/24 2028 07/20/24 2100  BP: 128/75 (!) 152/86  (!) 150/79  Pulse: 96 (!) 57  (!) 55  Resp: (!) 21 18  (!) 24  Temp:   98.5 F (36.9 C)   TempSrc:   Oral   SpO2: 94% 95%  92%  Weight:      Height:        Intake/Output Summary (Last 24 hours) at 07/20/2024 2119 Last data filed at 07/20/2024 2000 Gross per 24 hour  Intake 95.33 ml  Output 750 ml  Net -654.67 ml   Filed Weights   07/13/24 1834 07/13/24 1904 07/19/24 0849  Weight: (!) 137 kg (!) 140 kg 128 kg    Examination:   Constitutional: NAD, alert, oriented to person and place HEENT: conjunctivae and lids normal, EOMI CV: No cyanosis.   RESP: normal respiratory effort, on heated hf Neuro: II - XII grossly intact.   Psych: Normal mood and affect.     Data Reviewed: I have personally reviewed labs and imaging studies  Time spent: 50 minutes  [name], MD [facility] If 7PM-7AM, please contact night-coverage 07/20/2024, 9:19 PM   "

## 2024-07-20 NOTE — Progress Notes (Addendum)
 Pt family at bedside

## 2024-07-20 NOTE — Progress Notes (Signed)
 "  Progress Note  Patient Name: Ryan Hatfield Date of Encounter: 07/20/2024 [facility] Cardiologist: Redell Cave, MD   Interval Summary    Afib rates around 100. BP soft. Family is at bedside. Patient is on HFNC. He was agitated overnight.   Vital Signs Vitals:   07/20/24 0720 07/20/24 0735 07/20/24 0810 07/20/24 0811  BP:  135/86 135/86 135/86  Pulse: 98 (!) 120  (!) 109  Resp: (!) 22 19  20   Temp:  98.5 F (36.9 C)    TempSrc:  Oral    SpO2: 96% 97%  97%  Weight:      Height:        Intake/Output Summary (Last 24 hours) at 07/20/2024 0917 Last data filed at 07/19/2024 2238 Gross per 24 hour  Intake 210 ml  Output 2000 ml  Net -1790 ml      07/19/2024    8:49 AM 07/13/2024    7:04 PM 07/13/2024    6:34 PM  Last 3 Weights  Weight (lbs) 282 lb 3 oz 308 lb 10.3 oz 302 lb 0.5 oz  Weight (kg) 128 kg 140 kg 137 kg      Telemetry/ECG  Afib 100, up to 120 - Personally Reviewed  Physical Exam  GEN: No acute distress.   Neck: No JVD Cardiac: Irreg IRreg, no murmurs, rubs, or gallops.  Respiratory: Clear to auscultation bilaterally. GI: Soft, nontender, non-distended  MS: No edema   Cardiac Studies   07/16/2024 2D echo 1. Left ventricular ejection fraction, by estimation, is 60 to 65%. Left  ventricular ejection fraction by PLAX is 63 %. The left ventricle has  normal function. The left ventricle has no regional wall motion  abnormalities. There is moderate concentric  left ventricular hypertrophy. Left ventricular diastolic parameters are  indeterminate.   2. Right ventricular systolic function is normal. The right ventricular  size is normal.   3. The mitral valve is normal in structure. No evidence of mitral valve  regurgitation. No evidence of mitral stenosis.   4. The aortic valve is tricuspid. Aortic valve regurgitation is not  visualized. No aortic stenosis is present.   5. The inferior vena cava is dilated in size with >50% respiratory   variability, suggesting right atrial pressure of 8 mmHg.    Patient Profile   59 y.o. male with a hx of nonobstructive CAD, chronic HFpEF, atrial flutter s/p TEE cardioversion 01/2023, hypertension, hyperlipidemia, OSA, COPD, gout, and tobacco abuse who was admitted 1/7 with shortness of breath secondary to COPD exacerbation, and who is being seen for the ongoing management of atrial fibrillation with RVR.     Assessment & Plan   Afib with RVR - presenting 1/7 for worsening SOB with initial EKG revealing NSR - noted to be in Aflutter on 1/8 with repeat EKG 1/9 showing Afib - Echo showed preserved LVSF - patient reports intermittent compliance with Eliquis  prior to admission.  - remains in Afib with HR around 100, up to 120 at times - continue dilt 360mg  daily and Lopressor  75mg  BID. BP soft - amio previously stopped due to a/c noncompliance - transitioned from Eliquis  to Xarelto  for complaince - will likely require DCCV after 3-4 weeks of uninterrupted a/c   Acute on chronic HFpEF - pre-BNP minimally elevated on admission 527 - Echo showed LVEF 60-65%, indeterminate diastolic parameters - diuresis with net -7.6L,  - oral lasix  held for concern for overdiuresis - appears euvolemic - will reassess need for diuretic tomorrow  Acute on chronic hypoxic and hypercapnic respiratory failure COPD exacerbation OHS/OSA Encephalopathy due to CO2 narcosis - in the setting of acute on chronic CHF and COPD exacerbation - remains on HFNC - Bipap at night - per CCM   CAD Elevated troponin - nonobstructive CAD on cath 01/2023 - denies chest pain - HS trop minimally elevated with flat trending, not consistent with ACS  H/O alcohol use - CIWA per IM - patient denies recent alcohol use    For questions or updates, please contact [facility] Please consult [email or web address] for contact info under         Signed, Dublin Grayer VEAR Fishman, PA-C   "

## 2024-07-20 NOTE — Progress Notes (Signed)
 PT Cancellation Note  Patient Details Name: Ryan Hatfield MRN: [id number] DOB: 06/18/1966   Cancelled Treatment:    Reason Eval/Treat Not Completed: Medical issues which prohibited therapy (Patient transferred to higher level of care and will need new PT order when ready.)  Randine Essex, PT, MPT   Randine LULLA Essex 07/20/2024, 11:19 AM

## 2024-07-20 NOTE — Plan of Care (Signed)
" °  Problem: Education: Goal: Knowledge of disease or condition will improve Outcome: Not Progressing   Problem: Activity: Goal: Ability to tolerate increased activity will improve Outcome: Progressing   Problem: Respiratory: Goal: Ability to maintain a clear airway will improve Outcome: Progressing Goal: Levels of oxygenation will improve Outcome: Progressing   Problem: Education: Goal: Knowledge of General Education information will improve Description: Including pain rating scale, medication(s)/side effects and non-pharmacologic comfort measures Outcome: Not Progressing   Problem: Pain Managment: Goal: General experience of comfort will improve and/or be controlled Outcome: Adequate for Discharge   "

## 2024-07-20 NOTE — Plan of Care (Signed)
  Problem: Education: Goal: Knowledge of disease or condition will improve Outcome: Progressing Goal: Knowledge of the prescribed therapeutic regimen will improve Outcome: Progressing Goal: Individualized Educational Video(s) Outcome: Progressing   Problem: Activity: Goal: Ability to tolerate increased activity will improve Outcome: Progressing Goal: Will verbalize the importance of balancing activity with adequate rest periods Outcome: Progressing   Problem: Respiratory: Goal: Ability to maintain a clear airway will improve Outcome: Progressing Goal: Levels of oxygenation will improve Outcome: Progressing Goal: Ability to maintain adequate ventilation will improve Outcome: Progressing   Problem: Education: Goal: Knowledge of General Education information will improve Description: Including pain rating scale, medication(s)/side effects and non-pharmacologic comfort measures Outcome: Progressing   Problem: Health Behavior/Discharge Planning: Goal: Ability to manage health-related needs will improve Outcome: Progressing   Problem: Clinical Measurements: Goal: Ability to maintain clinical measurements within normal limits will improve Outcome: Progressing Goal: Will remain free from infection Outcome: Progressing Goal: Diagnostic test results will improve Outcome: Progressing Goal: Respiratory complications will improve Outcome: Progressing Goal: Cardiovascular complication will be avoided Outcome: Progressing   Problem: Activity: Goal: Risk for activity intolerance will decrease Outcome: Progressing   Problem: Nutrition: Goal: Adequate nutrition will be maintained Outcome: Progressing   Problem: Coping: Goal: Level of anxiety will decrease Outcome: Progressing   Problem: Elimination: Goal: Will not experience complications related to bowel motility Outcome: Progressing Goal: Will not experience complications related to urinary retention Outcome: Progressing    Problem: Pain Managment: Goal: General experience of comfort will improve and/or be controlled Outcome: Progressing   Problem: Safety: Goal: Ability to remain free from injury will improve Outcome: Progressing   Problem: Skin Integrity: Goal: Risk for impaired skin integrity will decrease Outcome: Progressing   Problem: Education: Goal: Ability to describe self-care measures that may prevent or decrease complications (Diabetes Survival Skills Education) will improve Outcome: Progressing Goal: Individualized Educational Video(s) Outcome: Progressing   Problem: Coping: Goal: Ability to adjust to condition or change in health will improve Outcome: Progressing   Problem: Fluid Volume: Goal: Ability to maintain a balanced intake and output will improve Outcome: Progressing   Problem: Health Behavior/Discharge Planning: Goal: Ability to identify and utilize available resources and services will improve Outcome: Progressing Goal: Ability to manage health-related needs will improve Outcome: Progressing   Problem: Metabolic: Goal: Ability to maintain appropriate glucose levels will improve Outcome: Progressing   Problem: Nutritional: Goal: Maintenance of adequate nutrition will improve Outcome: Progressing Goal: Progress toward achieving an optimal weight will improve Outcome: Progressing   Problem: Skin Integrity: Goal: Risk for impaired skin integrity will decrease Outcome: Progressing   Problem: Tissue Perfusion: Goal: Adequacy of tissue perfusion will improve Outcome: Progressing

## 2024-07-20 NOTE — Plan of Care (Signed)
Vital signs reviewed ? ?Will sign off at this time. No further recommendations at this time. ? ? ? ?Corrin Parker, M.D.  ?[facility]  ?Medical Director [place] ?Medical Director [facility] Cardio-Pulmonary Department  ? ?

## 2024-07-20 NOTE — Progress Notes (Addendum)
 Patient is confused. He scored a 10 on the ciwa scale. He consistently keeps removing HHNC and purewick. He is impulsive. He removed bipap twice within 30 minutes after applying. Informed Dr. Lawence of his actions, received orders for phenobarbital .  Safety sitter at bedside.

## 2024-07-21 DIAGNOSIS — J441 Chronic obstructive pulmonary disease with (acute) exacerbation: Secondary | ICD-10-CM | POA: Diagnosis not present

## 2024-07-21 DIAGNOSIS — I4891 Unspecified atrial fibrillation: Secondary | ICD-10-CM | POA: Diagnosis not present

## 2024-07-21 DIAGNOSIS — J9621 Acute and chronic respiratory failure with hypoxia: Secondary | ICD-10-CM | POA: Diagnosis not present

## 2024-07-21 DIAGNOSIS — Z720 Tobacco use: Secondary | ICD-10-CM | POA: Diagnosis not present

## 2024-07-21 LAB — GLUCOSE, CAPILLARY
Glucose-Capillary: 218 mg/dL — ABNORMAL HIGH (ref 70–99)
Glucose-Capillary: 223 mg/dL — ABNORMAL HIGH (ref 70–99)
Glucose-Capillary: 191 mg/dL — ABNORMAL HIGH (ref 70–99)
Glucose-Capillary: 249 mg/dL — ABNORMAL HIGH (ref 70–99)

## 2024-07-21 LAB — LEGIONELLA PNEUMOPHILA SEROGP 1 UR AG: L. pneumophila Serogp 1 Ur Ag: NEGATIVE

## 2024-07-21 MED ORDER — PREDNISONE 20 MG PO TABS
40.0000 mg | ORAL_TABLET | Freq: Every day | ORAL | Status: DC
  Administered 2024-07-22 – 2024-07-25 (×4): 40 mg via ORAL
  Filled 2024-07-21 (×4): qty 2

## 2024-07-21 MED ORDER — DIGOXIN 0.25 MG/ML IJ SOLN
0.2500 mg | INTRAMUSCULAR | Status: AC
  Administered 2024-07-21 (×2): 0.25 mg via INTRAVENOUS
  Filled 2024-07-21 (×2): qty 2

## 2024-07-21 MED ORDER — METOPROLOL TARTRATE 50 MG PO TABS
100.0000 mg | ORAL_TABLET | Freq: Two times a day (BID) | ORAL | Status: DC
  Administered 2024-07-21 – 2024-07-25 (×8): 100 mg via ORAL
  Filled 2024-07-21 (×8): qty 2

## 2024-07-21 MED ORDER — IPRATROPIUM-ALBUTEROL 0.5-2.5 (3) MG/3ML IN SOLN
3.0000 mL | Freq: Four times a day (QID) | RESPIRATORY_TRACT | Status: DC
  Administered 2024-07-21 – 2024-07-22 (×4): 3 mL via RESPIRATORY_TRACT
  Filled 2024-07-21 (×4): qty 3

## 2024-07-21 MED ORDER — DIGOXIN 250 MCG PO TABS
0.2500 mg | ORAL_TABLET | Freq: Every day | ORAL | Status: DC
  Administered 2024-07-22 – 2024-07-24 (×3): 0.25 mg via ORAL
  Filled 2024-07-21 (×3): qty 1

## 2024-07-21 MED ORDER — SODIUM CHLORIDE 0.9 % IV SOLN
2.0000 g | Freq: Once | INTRAVENOUS | Status: DC
  Filled 2024-07-21: qty 20

## 2024-07-21 NOTE — Plan of Care (Signed)
  Problem: Education: Goal: Knowledge of disease or condition will improve Outcome: Progressing Goal: Knowledge of the prescribed therapeutic regimen will improve Outcome: Progressing Goal: Individualized Educational Video(s) Outcome: Progressing   Problem: Activity: Goal: Ability to tolerate increased activity will improve Outcome: Progressing Goal: Will verbalize the importance of balancing activity with adequate rest periods Outcome: Progressing   Problem: Respiratory: Goal: Ability to maintain a clear airway will improve Outcome: Progressing Goal: Levels of oxygenation will improve Outcome: Progressing Goal: Ability to maintain adequate ventilation will improve Outcome: Progressing   Problem: Education: Goal: Knowledge of General Education information will improve Description: Including pain rating scale, medication(s)/side effects and non-pharmacologic comfort measures Outcome: Progressing   Problem: Health Behavior/Discharge Planning: Goal: Ability to manage health-related needs will improve Outcome: Progressing   Problem: Clinical Measurements: Goal: Ability to maintain clinical measurements within normal limits will improve Outcome: Progressing Goal: Will remain free from infection Outcome: Progressing Goal: Diagnostic test results will improve Outcome: Progressing Goal: Respiratory complications will improve Outcome: Progressing Goal: Cardiovascular complication will be avoided Outcome: Progressing   Problem: Activity: Goal: Risk for activity intolerance will decrease Outcome: Progressing   Problem: Nutrition: Goal: Adequate nutrition will be maintained Outcome: Progressing   Problem: Coping: Goal: Level of anxiety will decrease Outcome: Progressing   Problem: Elimination: Goal: Will not experience complications related to bowel motility Outcome: Progressing Goal: Will not experience complications related to urinary retention Outcome: Progressing    Problem: Pain Managment: Goal: General experience of comfort will improve and/or be controlled Outcome: Progressing   Problem: Safety: Goal: Ability to remain free from injury will improve Outcome: Progressing   Problem: Skin Integrity: Goal: Risk for impaired skin integrity will decrease Outcome: Progressing   Problem: Education: Goal: Ability to describe self-care measures that may prevent or decrease complications (Diabetes Survival Skills Education) will improve Outcome: Progressing Goal: Individualized Educational Video(s) Outcome: Progressing   Problem: Coping: Goal: Ability to adjust to condition or change in health will improve Outcome: Progressing   Problem: Fluid Volume: Goal: Ability to maintain a balanced intake and output will improve Outcome: Progressing   Problem: Health Behavior/Discharge Planning: Goal: Ability to identify and utilize available resources and services will improve Outcome: Progressing Goal: Ability to manage health-related needs will improve Outcome: Progressing   Problem: Metabolic: Goal: Ability to maintain appropriate glucose levels will improve Outcome: Progressing   Problem: Nutritional: Goal: Maintenance of adequate nutrition will improve Outcome: Progressing Goal: Progress toward achieving an optimal weight will improve Outcome: Progressing   Problem: Skin Integrity: Goal: Risk for impaired skin integrity will decrease Outcome: Progressing   Problem: Tissue Perfusion: Goal: Adequacy of tissue perfusion will improve Outcome: Progressing

## 2024-07-21 NOTE — TOC Progression Note (Signed)
 Transition of Care [facility] - Progression Note    Patient Details  Name: Ryan Hatfield MRN: [id number] Date of Birth: Dec 02, 1965  [facility]) CM/SW Contact  Lauraine JAYSON Carpen, LCSW Phone Number: 07/21/2024, 1:54 PM  Clinical Narrative:  CSW met with patient and daughter. They are agreeable to home health if we can find an agency to take his insurance. If not, will pursue outpatient therapy. Patient has home oxygen  through [employer]. CSW notified their liaison of need for bipap at discharge. Patient is agreeable to RW at discharge. Son or daughter will transport him home at discharge.   Expected Discharge Plan and Services                                               Social Drivers of Health (SDOH) Interventions SDOH Screenings   Food Insecurity: No Food Insecurity (07/20/2024)  Housing: Low Risk (01/30/2023)  Transportation Needs: No Transportation Needs (01/30/2023)  Utilities: Not At Risk (07/16/2024)  Depression (PHQ2-9): Medium Risk (04/02/2023)  Tobacco Use: High Risk (07/13/2024)    Readmission Risk Interventions     No data to display

## 2024-07-21 NOTE — TOC Initial Note (Signed)
 Transition of Care [facility]) - Initial/Assessment Note    Patient Details  Name: Ryan Hatfield MRN: [id number] Date of Birth: 15-Mar-1966  [facility]) CM/SW Contact:    Corrie JINNY Ruts, LCSW Phone Number: 07/21/2024, 9:15 AM  Clinical Narrative:                 Chart reviewed. I was able to speak with the patient daughter via phone call. I introduced myself, my role, and reason for consult. The patient daughter reports that she would like to change the patient PCP. The patient was accepting of PCP resources added to AVS.   The patient daughter reports that the patient will be D/C to her brothers home. The address is [address] [place]. The patient daughter reports that the patient is able to complete ALD independently. The patient daughter reports that the patient struggles with taking medications. The patient daughter reports that the patient drives himself to medical appointments. The patient daughter reports that [facility]. The patient daughter reports that the patient has never had HH or been admitted into a SNF in the past.   The patient daughter reports that the patient has a oxygen  at home. The patient daughter reports that the patient has no other equipment in the home.  SW explained that the patient will need to D/C with an bipap. The patient daughter verbalized understanding. The patient daughter reports concerns that the patient insurance is not paying for the patient inhaler.        Patient Goals and CMS Choice            Expected Discharge Plan and Services                                              Prior Living Arrangements/Services                       Activities of Daily Living   ADL Screening (condition at time of admission) Independently performs ADLs?: Yes (appropriate for developmental age) Is the patient deaf or have difficulty hearing?: Yes Does the patient have difficulty seeing, even when  wearing glasses/contacts?: No Does the patient have difficulty concentrating, remembering, or making decisions?: No  Permission Sought/Granted                  Emotional Assessment              Admission diagnosis:  Mixed hyperlipidemia [E78.2] Shortness of breath [R06.02] COPD (chronic obstructive pulmonary disease) (HCC) [J44.9] Vitamin B12 deficiency [E53.8] COPD exacerbation (HCC) [J44.1] Acquired hypothyroidism [E03.9] Chronic hand pain, unspecified laterality [F20.356, G89.29] Acute on chronic respiratory failure with hypoxia (HCC) [J96.21] Chronic bilateral low back pain without sciatica [M54.50, G89.29] Patient Active Problem List   Diagnosis Date Noted   Atrial fibrillation with rapid ventricular response (HCC) 07/18/2024   Atrial fibrillation, chronic (HCC) 07/14/2024   COPD (chronic obstructive pulmonary disease) (HCC) 07/14/2024   COPD exacerbation (HCC) 07/14/2024   Acute on chronic respiratory failure with hypoxia (HCC) 07/13/2024   Acute on chronic diastolic CHF (congestive heart failure) (HCC) 07/13/2024   COPD with acute exacerbation (HCC) 07/13/2024   HLD (hyperlipidemia) 07/13/2024   Shortness of breath 07/21/2023   Chronic hand pain 04/06/2023   Chronic bilateral low back pain without sciatica 04/06/2023  Tobacco abuse 04/02/2023   Snoring 02/10/2023   Morbid obesity (HCC) 02/10/2023   History of atrial flutter 02/10/2023   Acute on chronic congestive heart failure (HCC) 02/10/2023   On supplemental oxygen  by nasal cannula 02/10/2023   Diastasis of rectus abdominis 09/26/2022   Leukocytosis 09/05/2021   Vitamin B12 deficiency 08/07/2021   Vitamin D  deficiency 08/07/2021   Chronic gout of right foot 08/07/2021   COPD with hypoxia (HCC) 10/17/2019   Acquired hypothyroidism 10/17/2019   Mixed hyperlipidemia 10/17/2019   Essential hypertension 09/13/2019   Benign neoplasm of brain (HCC) 10/26/2017   Carpal tunnel syndrome 01/17/2016   Anxiety  and depression 05/29/2015   Alcohol abuse, in remission 05/29/2015   Arthritis, degenerative 02/03/2013   PCP:  Corwin Antu, FNP Pharmacy:   [facility] [address] [place] [facility] [address] [place] [address] Phone: [phone] Fax: [phone]  [facility] [address] [place] [address] Phone: [phone] Fax: [phone]     Social Drivers of Health (SDOH) Social History: SDOH Screenings   Food Insecurity: No Food Insecurity (07/20/2024)  Housing: Low Risk (01/30/2023)  Transportation Needs: No Transportation Needs (01/30/2023)  Utilities: Not At Risk (07/16/2024)  Depression (PHQ2-9): Medium Risk (04/02/2023)  Tobacco Use: High Risk (07/13/2024)   SDOH Interventions:     Readmission Risk Interventions     No data to display

## 2024-07-21 NOTE — Evaluation (Addendum)
 Physical Therapy Evaluation Patient Details Name: Ryan Hatfield MRN: [id number] DOB: [birth year] Today's Date: 07/21/2024  History of Present Illness  Patient is a 59 year old male with Acute Encephalopathy due to CO2 narcosis, acute on chronic respiratory failure with hypoxia, COPD with acute exacerbation. PMH: dCHF, HTN, HLD, COPD on 2L oxygen , hypothyroidism, GERD, depression with anxiety, A-fib on Eliquis , morbid obesity, smoker, alcohol abuse in remission, medication noncompliance  Clinical Impression  Patient is agreeable to PT evaluation. He is independent at baseline with mobility and wears oxygen  at home. He lives with a brother but is planning to discharge to his son's home at discharge.  The patient is cooperative today and able to follow all commands without difficulty. He needed only CGA for bed mobility. He stood once with CGA and able to take several side steps. Further mobility deferred out of safety due to fluctuating heart rate in the 130s-160's with activity. Sp02 in the mid to upper 90's on 5 L02. Anticipate the patient may require caregiver assistance to safely return home. PT will continue to follow to maximize independence and facilitate return to prior level of function.       If plan is discharge home, recommend the following: A little help with walking and/or transfers;A little help with bathing/dressing/bathroom;Assistance with cooking/housework;Help with stairs or ramp for entrance   Can travel by private vehicle        Equipment Recommendations Rolling walker (2 wheels)  Recommendations for Other Services    OT consult    Functional Status Assessment Patient has had a recent decline in their functional status and demonstrates the ability to make significant improvements in function in a reasonable and predictable amount of time.     Precautions / Restrictions Precautions Precautions: Fall Precaution/Restrictions Comments: monitor HR and  Sp02 Restrictions Weight Bearing Restrictions Per Provider Order: No      Mobility  Bed Mobility Overal bed mobility: Needs Assistance Bed Mobility: Supine to Sit, Sit to Supine     Supine to sit: Contact guard Sit to supine: Contact guard assist   General bed mobility comments: increased time required, no physical assistance needed    Transfers Overall transfer level: Needs assistance Equipment used: None Transfers: Sit to/from Stand Sit to Stand: Contact guard assist                Ambulation/Gait             Pre-gait activities: patient is able to take several side step to the left in bed with CGA for safety General Gait Details: not attempted due to elevated heart rate from 130's-160's with activity. deferred further activity out of safety  Stairs            Wheelchair Mobility     Tilt Bed    Modified Rankin (Stroke Patients Only)       Balance Overall balance assessment: Needs assistance Sitting-balance support: Feet supported Sitting balance-Leahy Scale: Good     Standing balance support: Single extremity supported Standing balance-Leahy Scale: Fair                               Pertinent Vitals/Pain Pain Assessment Pain Assessment: No/denies pain    Home Living Family/patient expects to be discharged to:: Private residence Living Arrangements: Other relatives (brother) Available Help at Discharge: Family Type of Home: House Home Access: Stairs to enter       Home Layout: One level  Additional Comments: planning to d/c home with son with a few steps to enter, single story home    Prior Function Prior Level of Function : Independent/Modified Independent             Mobility Comments: uses 02 at baseline, Mod I with mobility       Extremity/Trunk Assessment   Upper Extremity Assessment Upper Extremity Assessment: Generalized weakness    Lower Extremity Assessment Lower Extremity Assessment:  Generalized weakness       Communication   Communication Communication: No apparent difficulties    Cognition Arousal: Alert Behavior During Therapy: WFL for tasks assessed/performed   PT - Cognitive impairments: No apparent impairments                         Following commands: Intact       Cueing Cueing Techniques: Verbal cues     General Comments General comments (skin integrity, edema, etc.): Sp02 in mid to upper 90's with activity on 5 L HFNC    Exercises     Assessment/Plan    PT Assessment Patient needs continued PT services  PT Problem List Decreased strength;Decreased activity tolerance;Decreased range of motion;Decreased balance;Decreased mobility;Decreased safety awareness;Cardiopulmonary status limiting activity       PT Treatment Interventions DME instruction;Gait training;Stair training;Functional mobility training;Therapeutic activities;Therapeutic exercise;Balance training;Neuromuscular re-education;Cognitive remediation;Patient/family education;Wheelchair mobility training    PT Goals (Current goals can be found in the Care Plan section)  Acute Rehab PT Goals Patient Stated Goal: to go home to his son's house PT Goal Formulation: With patient/family Time For Goal Achievement: 08/04/24 Potential to Achieve Goals: Good    Frequency Min 2X/week     Co-evaluation               AM-PAC PT 6 Clicks Mobility  Outcome Measure Help needed turning from your back to your side while in a flat bed without using bedrails?: A Little Help needed moving from lying on your back to sitting on the side of a flat bed without using bedrails?: A Little Help needed moving to and from a bed to a chair (including a wheelchair)?: A Little Help needed standing up from a chair using your arms (e.g., wheelchair or bedside chair)?: A Little Help needed to walk in hospital room?: A Little Help needed climbing 3-5 steps with a railing? : A Lot 6 Click Score:  17    End of Session Equipment Utilized During Treatment: Oxygen  Activity Tolerance: Patient tolerated treatment well;Patient limited by fatigue Patient left: in bed;with call bell/phone within reach;with bed alarm set;with family/visitor present (son at bedside) Nurse Communication: Mobility status (elevated HR) PT Visit Diagnosis: Difficulty in walking, not elsewhere classified (R26.2);Muscle weakness (generalized) (M62.81)    Time: 9180-9162 PT Time Calculation (min) (ACUTE ONLY): 18 min   Charges:   PT Evaluation $PT Eval Moderate Complexity: 1 Mod   PT General Charges $$ ACUTE PT VISIT: 1 Visit         Randine Essex, PT, MPT   Randine LULLA Essex 07/21/2024, 10:36 AM

## 2024-07-21 NOTE — Discharge Instructions (Signed)
 Some PCP options in [place] area- not a comprehensive list  [facility]- [phone] [facility]- [phone] [facility]- [phone] [facility]- [phone] [facility]- [phone] Nichole Molly- [phone]  or [facility] Physician Referral Line [phone]

## 2024-07-21 NOTE — Progress Notes (Signed)
 "  Rounding Note   Patient Name: Ryan Hatfield Date of Encounter: 07/21/2024  [facility] Cardiologist: Redell Cave, MD   Subjective Less agitated today, daughter at the bedside - Overall no complaints, reports breathing is stable - Has cough, slight sputum production - Currently on 5 L high flow nasal cannula oxygen   - History of medication noncompliance, alcohol abuse in remission - Lives with her brother but planning to discharge to son's home  Scheduled Meds:  atorvastatin   40 mg Oral Daily   budesonide  (PULMICORT ) nebulizer solution  0.5 mg Nebulization BID   Chlorhexidine  Gluconate Cloth  6 each Topical QHS   diltiazem   360 mg Oral Daily   folic acid   1 mg Oral Daily   insulin  aspart  0-20 Units Subcutaneous TID WC   insulin  aspart  0-5 Units Subcutaneous QHS   ipratropium-albuterol   3 mL Nebulization Q6H   levothyroxine   50 mcg Oral Daily   metoprolol  tartrate  75 mg Oral BID   nicotine   21 mg Transdermal Daily   pantoprazole   40 mg Oral Daily   polyethylene glycol  17 g Oral Daily   [START ON 07/22/2024] predniSONE   40 mg Oral Q breakfast   rivaroxaban   20 mg Oral Q supper   [START ON 07/25/2024] thiamine  (VITAMIN B1) injection  100 mg Intravenous Q24H   Continuous Infusions:  cefTRIAXone  (ROCEPHIN )  IV 2 g (07/21/24 1148)   thiamine  (VITAMIN B1) injection 500 mg (07/21/24 1651)   Followed by   NOREEN ON 07/23/2024] thiamine  (VITAMIN B1) injection     PRN Meds: acetaminophen , albuterol , dextromethorphan-guaiFENesin , magnesium  hydroxide, nitroGLYCERIN , senna-docusate, sodium phosphate    Vital Signs  Vitals:   07/21/24 0749 07/21/24 1123 07/21/24 1410 07/21/24 1629  BP: 125/75 (!) 157/76  (!) 145/84  Pulse: 93   (!) 108  Resp: 18 18  19   Temp: 98.7 F (37.1 C) 98.8 F (37.1 C)  98 F (36.7 C)  TempSrc:    Oral  SpO2: 96% 97% 95% 94%  Weight:      Height:        Intake/Output Summary (Last 24 hours) at 07/21/2024 1727 Last data filed at  07/21/2024 1624 Gross per 24 hour  Intake 120 ml  Output 1650 ml  Net -1530 ml      07/20/2024   10:34 PM 07/19/2024    8:49 AM 07/13/2024    7:04 PM  Last 3 Weights  Weight (lbs) 279 lb 5.2 oz 282 lb 3 oz 308 lb 10.3 oz  Weight (kg) 126.7 kg 128 kg 140 kg      Telemetry Atrial fibrillation rate 110 up to 120s- Personally Reviewed  ECG   - Personally Reviewed  Physical Exam  GEN: No acute distress.   Neck: No JVD Cardiac: Irregularly irregular, no murmurs, rubs, or gallops.  Respiratory: Coarse breath sounds GI: Soft, nontender, non-distended  MS: No edema; No deformity. Neuro:  Nonfocal  Psych: Normal affect   Labs High Sensitivity Troponin:  No results for input(s): TROPONINIHS in the last 720 hours.  Recent Labs  Lab 07/13/24 1835 07/13/24 2212 07/14/24 0448 07/15/24 0439  TRNPT 20* 21* 16 22*       Chemistry Recent Labs  Lab 07/19/24 0416 07/19/24 1625 07/20/24 0334  NA 146* 139 139  K 5.3* 4.6 4.3  CL 90* 91* 95*  CO2 >45* 38* 37*  GLUCOSE 154* 302* 183*  BUN 33* 33* 34*  CREATININE 1.06 1.12 1.14  CALCIUM  9.2 9.0 8.7*  MG  --  2.5* 2.7*  GFRNONAA >60 >60 >60  ANIONGAP NOT CALCULATED 11 7    Lipids No results for input(s): CHOL, TRIG, HDL, LABVLDL, LDLCALC, CHOLHDL in the last 168 hours.  Hematology Recent Labs  Lab 07/16/24 0533 07/18/24 0435 07/20/24 0334  WBC 17.7* 18.9* 20.8*  RBC 4.66 4.97 5.24  HGB 13.9 14.4 15.4  HCT 43.7 49.2 49.0  MCV 93.8 99.0 93.5  MCH 29.8 29.0 29.4  MCHC 31.8 29.3* 31.4  RDW 12.9 12.5 12.0  PLT 208 265 311   Thyroid  No results for input(s): TSH, FREET4 in the last 168 hours.  BNPNo results for input(s): BNP, PROBNP in the last 168 hours.  DDimer No results for input(s): DDIMER in the last 168 hours.   Radiology  No results found.  Cardiac Studies   Patient Profile   59 y.o. male with a hx of nonobstructive CAD, chronic HFpEF, atrial flutter s/p TEE cardioversion 01/2023,  hypertension, hyperlipidemia, OSA, COPD, gout, and tobacco abuse who was admitted 1/7 with shortness of breath secondary to COPD exacerbation, and who is being seen for the ongoing management of atrial fibrillation with RVR.     Assessment & Plan  Atrial fibrillation with RVR -History of atrial fibrillation, noncompliance with Eliquis  as outpatient Presenting July 13, 2024 with shortness of breath, hypoxia, pneumonia -Initially presenting normal sinus rhythm - First documentation of atrial fibrillation/flutter on January 8 2:47 AM - Atrial flutter converting to atrial fibrillation January 9 -As outpatient was not taking Eliquis  -Eliquis  restarted January 8 with metoprolol  XR 150 daily -Was changed to Xarelto  for patient compliance -Amiodarone  discontinued by cardiology team over the weekend given noncompliance with Eliquis  as outpatient (he did present in normal sinus rhythm) -Given elevated rate recommend we increase metoprolol   tartrate up to 100 twice daily -Add digoxin  0.25 IV load x 2, start 0.125 tomorrow oral -Poor candidate at this time for TEE cardioversion given respiratory status   COPD exacerbation, acute on chronic hypoxic respiratory failure - Long history of smoking, COPD - Hypercapnia an issue, treated with acetazolamide  On antibiotics azithromycin , ceftriaxone , inhalers, steroids -Remains on 5 L high flow nasal cannula oxygen , slow improvement   Chronic diastolic CHF - proBNP 527 in the setting of COPD - Appears relatively euvolemic, Lasix  held over the weekend - Consider SGLT2 inhibitor if able to afford it though cost may be an issue   Nonobstructive coronary artery disease No plans for ischemic workup at this time Continue metoprolol , Lipitor   For questions or updates, please contact [facility] Please consult [email or web address] for contact info under     Signed, Chidi Shirer, MD  07/21/2024, 5:27 PM    "

## 2024-07-21 NOTE — Progress Notes (Signed)
 " PROGRESS NOTE    Ryan Hatfield  FMW:[id number] DOB: 1965/08/08 DOA: 07/13/2024 PCP: Ryan Antu, FNP  246A/246A-AA  LOS: 8 days   Brief hospital course:   Assessment & Plan: Ryan Hatfield is a 59 y.o. male with medical history significant of dCHF, HTN, HLD, COPD on 2L oxygen , hypothyroidism, GERD, depression with anxiety, A-fib on Eliquis , morbid obesity, smoker, alcohol abuse in remission, medication noncompliance, who presents with SOB.   Patient states that he is not taking most of his medications including Eliquis  for a while due to insurance issue. (He is not able to recall some medications he's not been taking, reports not on inhalers recently, not sure about all cradiac meds but reports he's been on his eliquis ) He developed SOB in the past 2 days, which has been progressively worsening. He has cough with clear mucus production, no chest pain, fever or chills. Patient is normally using 2 L oxygen  at baseline, but was found to have oxygen  desaturation to 76% on home level 2 L oxygen , initially started 6-8 L oxygen , still has respiratory distress and difficulty speaking in full sentence, then started BiPAP with improvement.  BiPAP is weaned off now, currently on 6 L oxygen  with 100% of saturation.    01/07:  to ED. BNP 527, GFR> 60, troponin 20 --> 21, negative PCR for COVID, flu and RSV. Temperature normal, blood pressure 158/62, heart rate 101 --> 84, RR 21.  Chest x-ray showed cardiomegaly and bibasilar atelectasis.  Patient is admitted to PCU as inpatient. Needing HFNC up to 15 L/min 01/08: BiPAP overnight w/ improvement in Afib rate. Off BiPAP today per patient request, remains on high flow [place] 15 L/min. Continue diuresis. UOP yesterday -1400 mL 01/09: HFNC 11 L/min, no BiPAP overnight. UOP yesterday -1500 mL. Back into Afib RVR, dilt bolus ineffective, starting drip, Echo ordered. D/w cardiology, plan to see him tomorrow but they are aware of his case in case decompensation /  emergent cardioversion needed 01/10: Cardiology following --> dc amiodarone  (pt now notes spotty compliance w/ home Eliquis ), continue diltiazem  infusion, changed to metoprolol  tartrate 37.5 mg q6h, holding lasix  for today. Echo EF 60-65 mod LVH indeterminate diastolic 01/11: consolidate diltiazem  to 360mg  daily and metoprolol  75mg  bid. Still on high low [place] 13L/[place] CT chest, no PE, (+)concern pneumonia so started abx, later up to [facility] 50L/min. Will ask pulmonary to review CT, question may need bronch - does not - duonebs q4h, add pulmicort  01/12: hypercarbic, on BiPAP overnight and  back on HHFNC through the day, repeating BMP this afternoon. CO2 narcosis mild confusion  01/13: more confused/agitated overnight, unresponsive this morning, transferring to ICU anticipate intubation pr potentially needs precedex + BiPAP   Acute Encephalopathy due to CO2 narcosis Hypoventilation --improved with brief BiPAP --BiPAP PRN and nightly  Acute on chronic respiratory failure with hypoxia Acute hypercarbic respiratory failure   On 2L O2 at baseline --Likely due to combination of COPD and CHF exacerbation.   --transitioned from heated hf to 5L  today --Continue supplemental O2 to keep sats >=90%, wean as tolerated  COPD with acute exacerbation  Wheezing on exam  --received 8 days of IV solumedrol --d/c IV solumedrol today --cont DuoNeb scheduled   Community acquired PNA --cont ceftriaxone  and azithro (total 5 doses)   OSA Suspect may need BiPAP at bedtime long-term   Acute on chronic diastolic CHF (congestive heart failure)  Patient presenting w/ leg edema, indicating possible CHF exacerbation.  His BNP was 527 which is  likely falsely low due to morbid obesity. Last echo 2024 - EF 60-65%, grade 1 diast df  07/16/2024 Echo EF 60-65 mod LVH indeterminate diastolic appears relatively euvolemic on exam although volume status is difficult to assess given body habitus  --s/p IV lasix  --diuretics  currently on hold  Afib RVR --switched from Eliquis  to Xarelto  to help with compliance --cardio consulted --cont Cardizem  360 mg daily --increase Lopressor  to 100 mg BID --Add digoxin  0.25 IV load x 2, start 0.125 tomorrow oral    CAD  - Nonobstructive CAD on cath 01/2023  --cont lipitor  Elevation troponin Likely demand ischemia   Essential hypertension --BP varied widely   Leukocytosis Steroid effect Monitor CBC periodically / as needed   Acquired hypothyroidism Synthroid    Tobacco abuse Nicotine  patch Did counseling about importance of quitting smoking   Class 3 obesity based on BMI: Body mass index is 45.58 kg/m.  Hyperglycemia 2/2 steroid use --ACHS and SSI    DVT prophylaxis: On:Xarelto   Code Status: Full code  Family Communication: daughter updated at bedside today Level of care: Progressive Dispo:   The patient is from: home Anticipated d/c is to: home Anticipated d/c date is: 2-3 days   Subjective and Interval History:  Pt reported feeling better, ate his meals.     Objective: Vitals:   07/21/24 1629 07/21/24 1749 07/21/24 1951 07/21/24 2101  BP: (!) 145/84 (!) 159/70  (!) 151/84  Pulse: (!) 108   80  Resp: 19 20  18   Temp: 98 F (36.7 C)   98.8 F (37.1 C)  TempSrc: Oral     SpO2: 94% 96% 95% 96%  Weight:      Height:        Intake/Output Summary (Last 24 hours) at 07/21/2024 2211 Last data filed at 07/21/2024 1909 Gross per 24 hour  Intake 240 ml  Output 1200 ml  Net -960 ml   Filed Weights   07/13/24 1904 07/19/24 0849 07/20/24 2234  Weight: (!) 140 kg 128 kg 126.7 kg    Examination:   Constitutional: NAD, alert, oriented HEENT: conjunctivae and lids normal, EOMI CV: No cyanosis.   RESP: normal respiratory effort, on 5L Extremities: No effusions, edema in BLE SKIN: warm, dry Neuro: II - XII grossly intact.   Psych: Normal mood and affect.     Data Reviewed: I have personally reviewed labs and imaging studies  Time  spent: 50 minutes  Ryan Haber, MD [facility] If 7PM-7AM, please contact night-coverage 07/21/2024, 10:11 PM   "

## 2024-07-22 DIAGNOSIS — I2510 Atherosclerotic heart disease of native coronary artery without angina pectoris: Secondary | ICD-10-CM

## 2024-07-22 DIAGNOSIS — I482 Chronic atrial fibrillation, unspecified: Secondary | ICD-10-CM | POA: Diagnosis not present

## 2024-07-22 DIAGNOSIS — I5033 Acute on chronic diastolic (congestive) heart failure: Secondary | ICD-10-CM | POA: Diagnosis not present

## 2024-07-22 DIAGNOSIS — I4891 Unspecified atrial fibrillation: Secondary | ICD-10-CM | POA: Diagnosis not present

## 2024-07-22 DIAGNOSIS — J9621 Acute and chronic respiratory failure with hypoxia: Secondary | ICD-10-CM | POA: Diagnosis not present

## 2024-07-22 LAB — GLUCOSE, CAPILLARY
Glucose-Capillary: 185 mg/dL — ABNORMAL HIGH (ref 70–99)
Glucose-Capillary: 223 mg/dL — ABNORMAL HIGH (ref 70–99)
Glucose-Capillary: 171 mg/dL — ABNORMAL HIGH (ref 70–99)
Glucose-Capillary: 191 mg/dL — ABNORMAL HIGH (ref 70–99)

## 2024-07-22 LAB — CULTURE, BLOOD (ROUTINE X 2)
Culture: NO GROWTH
Culture: NO GROWTH

## 2024-07-22 MED ORDER — METOPROLOL TARTRATE 5 MG/5ML IV SOLN
5.0000 mg | Freq: Once | INTRAVENOUS | Status: AC
  Administered 2024-07-22: 5 mg via INTRAVENOUS
  Filled 2024-07-22: qty 5

## 2024-07-22 MED ORDER — IPRATROPIUM-ALBUTEROL 0.5-2.5 (3) MG/3ML IN SOLN
3.0000 mL | Freq: Two times a day (BID) | RESPIRATORY_TRACT | Status: DC
  Administered 2024-07-22 – 2024-07-25 (×5): 3 mL via RESPIRATORY_TRACT
  Filled 2024-07-22 (×5): qty 3

## 2024-07-22 MED ORDER — DILTIAZEM HCL 25 MG/5ML IV SOLN
5.0000 mg | Freq: Once | INTRAVENOUS | Status: DC
  Filled 2024-07-22: qty 5

## 2024-07-22 NOTE — TOC CM/SW Note (Signed)
 Due to Mirza E Bedore  diagnosis of Acute on Chronic respiratory failure with hypoxia and hypercapnia secondary to COPD , non-invasive ventilation is needed to assist with normalizing carbon dioxide & oxygen  levels and to reduce the risk of repeat, acute episodes of respiratory failure resulting in longer inpatient hospital stays with more acute levels of care, such as ICU and mechanical ventilation. This patient would also benefit from mouthpiece ventilation for prn daytime use. He has been hospitalized for Acute on Chronic respiratory failure with hypoxia and hypercapnia and COPD . BIPAP,BIPAP ST, AVAPS has been considered but has been ruled-out and insufficient. NIV therapy is needed Pts PC02 90 was during this hospital stay on 07/20/23. NIV exceeds capability of a RAD. Patient was using NIV 24 HOURS OF DISCHARGE.  Interruption or failure to provide Home Medical Ventilator would quickly lead to exacerbation of the patients condition, lead to more hospitalizations and likely harm the patient or possibly death. Continued use of the Home Medical Ventilator is preferred. Patient is able to maintain airway and clear secretions.

## 2024-07-22 NOTE — Progress Notes (Signed)
 Physical Therapy Treatment Patient Details Name: Ryan Hatfield MRN: [id number] DOB: 04/13/1966 Today's Date: 07/22/2024   History of Present Illness Patient is a 59 year old male with Acute Encephalopathy due to CO2 narcosis, acute on chronic respiratory failure with hypoxia, COPD with acute exacerbation. PMH: dCHF, HTN, HLD, COPD on 2L oxygen , hypothyroidism, GERD, depression with anxiety, A-fib on Eliquis , morbid obesity, smoker, alcohol abuse in remission, medication noncompliance    PT Comments  Patient is making good progress with independence and activity tolerance this session. He walked in the hallway with rolling walker with supervision for safety. No significant dyspnea noted but patient is fatigued with activity. Sp02 91% on 3 L02 and heart rate in the 110's with activity (no higher than 117bpm noted). The patient is hopeful for return home soon with family support. PT will continue to follow to maximize independence and decrease caregiver burden.    If plan is discharge home, recommend the following: A little help with walking and/or transfers;A little help with bathing/dressing/bathroom;Assistance with cooking/housework;Help with stairs or ramp for entrance   Can travel by private vehicle        Equipment Recommendations  Rolling walker (2 wheels)    Recommendations for Other Services       Precautions / Restrictions Precautions Precautions: Fall Restrictions Weight Bearing Restrictions Per Provider Order: No     Mobility  Bed Mobility Overal bed mobility: Needs Assistance Bed Mobility: Supine to Sit, Sit to Supine     Supine to sit: Contact guard     General bed mobility comments: increased time    Transfers Overall transfer level: Needs assistance Equipment used: Rolling walker (2 wheels) Transfers: Sit to/from Stand Sit to Stand: Supervision           General transfer comment: no physical assistance required    Ambulation/Gait Ambulation/Gait  assistance: Supervision Gait Distance (Feet): 50 Feet Assistive device: Rolling walker (2 wheels) Gait Pattern/deviations: Step-through pattern Gait velocity: decreased     General Gait Details: encouraged using rolling walker for support with ambulation for safety. education on energy conservation, monitoring for need for rest breaks. no significant dyspnea but patient is fatigued with activity. noted HR in the 110's, Sp02 91% on 3 L02   Stairs             Wheelchair Mobility     Tilt Bed    Modified Rankin (Stroke Patients Only)       Balance Overall balance assessment: Needs assistance Sitting-balance support: Feet supported Sitting balance-Leahy Scale: Good     Standing balance support: Single extremity supported Standing balance-Leahy Scale: Fair                              Hotel Manager: No apparent difficulties  Cognition Arousal: Alert Behavior During Therapy: WFL for tasks assessed/performed   PT - Cognitive impairments: No apparent impairments                         Following commands: Intact      Cueing Cueing Techniques: Verbal cues  Exercises      General Comments General comments (skin integrity, edema, etc.): patient pleased to be walking in the hallway. he asked to sit on the edge of bed for lunch. encouraged routine upright mobility with staff assistance. supportive family in the room throughout session      Pertinent Vitals/Pain Pain Assessment Pain Assessment: No/denies  pain    Home Living                          Prior Function            PT Goals (current goals can now be found in the care plan section) Acute Rehab PT Goals Patient Stated Goal: to go home to his son's house PT Goal Formulation: With patient/family Time For Goal Achievement: 08/04/24 Potential to Achieve Goals: Good Progress towards PT goals: Progressing toward goals    Frequency    Min  2X/week      PT Plan      Co-evaluation              AM-PAC PT 6 Clicks Mobility   Outcome Measure  Help needed turning from your back to your side while in a flat bed without using bedrails?: A Little Help needed moving from lying on your back to sitting on the side of a flat bed without using bedrails?: A Little Help needed moving to and from a bed to a chair (including a wheelchair)?: A Little Help needed standing up from a chair using your arms (e.g., wheelchair or bedside chair)?: A Little Help needed to walk in hospital room?: A Little Help needed climbing 3-5 steps with a railing? : A Lot 6 Click Score: 17    End of Session Equipment Utilized During Treatment: Oxygen  Activity Tolerance: Patient tolerated treatment well Patient left: in bed;with call bell/phone within reach;with bed alarm set;with family/visitor present (seated on side of bed, daughter at bedside) Nurse Communication: Mobility status PT Visit Diagnosis: Difficulty in walking, not elsewhere classified (R26.2);Muscle weakness (generalized) (M62.81)     Time: 8746-8680 PT Time Calculation (min) (ACUTE ONLY): 26 min  Charges:    $Therapeutic Activity: 23-37 mins PT General Charges $$ ACUTE PT VISIT: 1 Visit                    Randine Essex, PT, MPT    Randine LULLA Essex 07/22/2024, 1:33 PM

## 2024-07-22 NOTE — Progress Notes (Addendum)
 "  Rounding Note   Patient Name: Ryan Hatfield Date of Encounter: 07/22/2024  [facility] Cardiologist: Redell Cave, MD   Subjective Feeling more or less the same today.  Rates have been controlled.  Oxygen  requirement lower than yesterday.  No significant events.  Scheduled Meds:  atorvastatin   40 mg Oral Daily   budesonide  (PULMICORT ) nebulizer solution  0.5 mg Nebulization BID   Chlorhexidine  Gluconate Cloth  6 each Topical QHS   digoxin   0.25 mg Oral Daily   diltiazem   360 mg Oral Daily   folic acid   1 mg Oral Daily   insulin  aspart  0-20 Units Subcutaneous TID WC   insulin  aspart  0-5 Units Subcutaneous QHS   ipratropium-albuterol   3 mL Nebulization Q6H   levothyroxine   50 mcg Oral Daily   metoprolol  tartrate  100 mg Oral BID   nicotine   21 mg Transdermal Daily   pantoprazole   40 mg Oral Daily   polyethylene glycol  17 g Oral Daily   predniSONE   40 mg Oral Q breakfast   rivaroxaban   20 mg Oral Q supper   [START ON 07/25/2024] thiamine  (VITAMIN B1) injection  100 mg Intravenous Q24H   Continuous Infusions:  cefTRIAXone  (ROCEPHIN )  IV 2 g (07/22/24 0836)   thiamine  (VITAMIN B1) injection 500 mg (07/21/24 2109)   Followed by   NOREEN ON 07/23/2024] thiamine  (VITAMIN B1) injection     PRN Meds: acetaminophen , albuterol , dextromethorphan-guaiFENesin , magnesium  hydroxide, nitroGLYCERIN , senna-docusate, sodium phosphate    Vital Signs  Vitals:   07/22/24 0632 07/22/24 0707 07/22/24 0807 07/22/24 0820  BP: 99/60 122/69  133/64  Pulse: 92 88  76  Resp:  16    Temp:    97.9 F (36.6 C)  TempSrc:    Oral  SpO2: 97%  93% 92%  Weight:      Height:        Intake/Output Summary (Last 24 hours) at 07/22/2024 0907 Last data filed at 07/22/2024 0410 Gross per 24 hour  Intake 240 ml  Output 1600 ml  Net -1360 ml      07/20/2024   10:34 PM 07/19/2024    8:49 AM 07/13/2024    7:04 PM  Last 3 Weights  Weight (lbs) 279 lb 5.2 oz 282 lb 3 oz 308 lb 10.3 oz   Weight (kg) 126.7 kg 128 kg 140 kg      Telemetry Atrial fibrillation rate <105 - Personally Reviewed  ECG   - Personally Reviewed  Physical Exam  GEN: No acute distress.   Neck: Unable to assess given body habitus Cardiac: Irregularly irregular, no murmurs, rubs, or gallops.  Respiratory: Coarse breath sounds GI: Soft, nontender, non-distended  MS: No edema; No deformity. Neuro:  Nonfocal  Psych: Normal affect   Labs High Sensitivity Troponin:  No results for input(s): TROPONINIHS in the last 720 hours.  Recent Labs  Lab 07/13/24 1835 07/13/24 2212 07/14/24 0448 07/15/24 0439  TRNPT 20* 21* 16 22*       Chemistry Recent Labs  Lab 07/19/24 0416 07/19/24 1625 07/20/24 0334  NA 146* 139 139  K 5.3* 4.6 4.3  CL 90* 91* 95*  CO2 >45* 38* 37*  GLUCOSE 154* 302* 183*  BUN 33* 33* 34*  CREATININE 1.06 1.12 1.14  CALCIUM  9.2 9.0 8.7*  MG  --  2.5* 2.7*  GFRNONAA >60 >60 >60  ANIONGAP NOT CALCULATED 11 7    Lipids No results for input(s): CHOL, TRIG, HDL, LABVLDL, LDLCALC, CHOLHDL in  the last 168 hours.  Hematology Recent Labs  Lab 07/16/24 0533 07/18/24 0435 07/20/24 0334  WBC 17.7* 18.9* 20.8*  RBC 4.66 4.97 5.24  HGB 13.9 14.4 15.4  HCT 43.7 49.2 49.0  MCV 93.8 99.0 93.5  MCH 29.8 29.0 29.4  MCHC 31.8 29.3* 31.4  RDW 12.9 12.5 12.0  PLT 208 265 311   Thyroid  No results for input(s): TSH, FREET4 in the last 168 hours.  BNPNo results for input(s): BNP, PROBNP in the last 168 hours.  DDimer No results for input(s): DDIMER in the last 168 hours.   Radiology  No results found.  Cardiac Studies   Patient Profile   59 y.o. male with a hx of nonobstructive CAD, chronic HFpEF, atrial flutter s/p TEE cardioversion 01/2023, hypertension, hyperlipidemia, OSA, COPD, gout, and tobacco abuse who was admitted 1/7 with shortness of breath secondary to COPD exacerbation, and who is being seen for the ongoing management of atrial  fibrillation with RVR.     Assessment & Plan  Atrial fibrillation with RVR Paroxysmal atrial fibrillation Presented in sinus, subsequently went into atrial fibrillation/flutter.  Likely driven by underlying pulmonary disease.  Nonadherent with Eliquis  as outpatient.  Initially on amiodarone , discontinued by cardiology team over the weekend given noncompliance with Eliquis  as outpatient (he did present in normal sinus rhythm).  Ejection fraction preserved.  Plan: - Continue Xarelto  20 mg daily; switched from Eliquis  to this to encourage compliance - Continue metoprolol  tartrate 100 mg twice daily - Continue diltiazem  XR 360 mg daily - Continue digoxin  0.25 mg daily - Poor candidate at this time for TEE cardioversion given respiratory status and noncompliance with anticoagulation; he will likely be best served with 3 weeks of anticoagulation followed by a DCCV without TEE - If the above rate control plan is faltering, then he may need to go back on amiodarone  with discontinuation of digoxin  - Continue to treat underlying pulmonary disease   COPD exacerbation, acute on chronic hypoxic respiratory failure - Long history of smoking, COPD On antibiotics azithromycin , ceftriaxone , inhalers, steroids -Remains on 3 L high flow nasal cannula oxygen , slow improvement   Chronic diastolic CHF - proBNP 527 in the setting of COPD - Appears relatively euvolemic, Lasix  held over the weekend - Consider SGLT2 inhibitor if able to afford it though cost may be an issue   Nonobstructive coronary artery disease No plans for ischemic workup at this time Continue metoprolol , Lipitor, Xarelto    For questions or updates, please contact [facility] Please consult [email or web address] for contact info under     Signed, Caron Poser, MD  07/22/2024, 9:07 AM    "

## 2024-07-22 NOTE — Progress Notes (Addendum)
 "       Overnight   NAME: Ryan Hatfield MRN: [id number] DOB : 12-07-1965    Date of Service   07/22/2024   HPI/Events of Note   HPI:  59 y.o. male with medical history significant of dCHF, HTN, HLD, COPD on 2L oxygen , hypothyroidism, GERD, depression with anxiety, A-fib on Eliquis , morbid obesity, smoker, alcohol abuse in remission, medication noncompliance, who presents with SOB.    Patient states that he is not taking most of his medications including Eliquis  for a while due to insurance issue. (He is not able to recall some medications he's not been taking, reports not on inhalers recently, not sure about all cradiac meds but reports he's been on his eliquis ) He developed SOB in the past 2 days, which has been progressively worsening. He has cough with clear mucus production, no chest pain, fever or chills. Patient is normally using 2 L oxygen  at baseline, but was found to have oxygen  desaturation to 76% on home level 2 L oxygen , initially started 6-8 L oxygen , still has respiratory distress and difficulty speaking in full sentence, then started BiPAP with improvement.  BiPAP is weaned off now, currently on 6 L oxygen  with 100% of saturation.    01/07:  to ED. BNP 527, GFR> 60, troponin 20 --> 21, negative PCR for COVID, flu and RSV. Temperature normal, blood pressure 158/62, heart rate 101 --> 84, RR 21.  Chest x-ray showed cardiomegaly and bibasilar atelectasis.  Patient is admitted to PCU as inpatient. Needing HFNC up to 15 L/min 01/08: BiPAP overnight w/ improvement in Afib rate. Off BiPAP today per patient request, remains on high flow [place] 15 L/min. Continue diuresis. UOP yesterday -1400 mL 01/09: HFNC 11 L/min, no BiPAP overnight. UOP yesterday -1500 mL. Back into Afib RVR, dilt bolus ineffective, starting drip, Echo ordered. D/w cardiology, plan to see him tomorrow but they are aware of his case in case decompensation / emergent cardioversion needed 01/10: Cardiology following --> dc  amiodarone  (pt now notes spotty compliance w/ home Eliquis ), continue diltiazem  infusion, changed to metoprolol  tartrate 37.5 mg q6h, holding lasix  for today. Echo EF 60-65 mod LVH indeterminate diastolic 01/11: consolidate diltiazem  to 360mg  daily and metoprolol  75mg  bid. Still on high low [place] 13L/[place] CT chest, no PE, (+)concern pneumonia so started abx, later up to [facility] 50L/min. Will ask pulmonary to review CT, question may need bronch - does not - duonebs q4h, add pulmicort  01/12: hypercarbic, on BiPAP overnight and  back on HHFNC through the day, repeating BMP this afternoon. CO2 narcosis mild confusion  01/13: more confused/agitated overnight, unresponsive this morning, transferring to ICU anticipate intubation pr potentially needs precedex + BiPAP 01/14: critical care signed off     Overnight:  RN reports telemetry ringing out A-fib/a flutter with heart rates anywhere from 130-170.  5 mg IV metoprolol  ordered with improvement of heart rate to 100.known A-fib with RVR currently on diltiazem  360 mg a day and Lopressor  75 mg a day twice daily.  Amio previously stopped due to noncompliance transition from Eliquis  to Xarelto  for compliance.  Continue to monitor patient encouraged BiPAP use.  Obtain new EKG - Aflutter rate 120s, ordered  5 mg diltiazem . Rn reports hypotension with sbp in the 90s-hold for now.       Interventions/ Plan   IV metoprolol   Maintain on bipap       Updates     Laneta GardenerGLENWOOD Peter BSN RN CCRN AGACNP-BC Acute Care Nurse Practitioner [facility]  "

## 2024-07-22 NOTE — Progress Notes (Signed)
 " PROGRESS NOTE    Ryan Hatfield  FMW:[id number] DOB: 28-Jul-1965 DOA: 07/13/2024 PCP: Corwin Antu, FNP  246A/246A-AA  LOS: 9 days   Brief hospital course:  Assessment & Plan: Ryan Hatfield is a 59 y.o. male with medical history significant of dCHF, HTN, HLD, COPD on 2L oxygen , hypothyroidism, GERD, depression with anxiety, A-fib on Eliquis , morbid obesity, smoker, alcohol abuse in remission, medication noncompliance, who presents with SOB.    01/07:  to ED. BNP 527, GFR> 60, troponin 20 --> 21, negative PCR for COVID, flu and RSV. Temperature normal, blood pressure 158/62, heart rate 101 --> 84, RR 21.  Chest x-ray showed cardiomegaly and bibasilar atelectasis.  Patient is admitted to PCU as inpatient. Needing HFNC up to 15 L/min 01/08: BiPAP overnight w/ improvement in Afib rate. Off BiPAP today per patient request, remains on high flow [place] 15 L/min. Continue diuresis. UOP yesterday -1400 mL 01/09: HFNC 11 L/min, no BiPAP overnight. UOP yesterday -1500 mL. Back into Afib RVR, dilt bolus ineffective, starting drip, Echo ordered. D/w cardiology, plan to see him tomorrow but they are aware of his case in case decompensation / emergent cardioversion needed 01/10: Cardiology following --> dc amiodarone  (pt now notes spotty compliance w/ home Eliquis ), continue diltiazem  infusion, changed to metoprolol  tartrate 37.5 mg q6h, holding lasix  for today. Echo EF 60-65 mod LVH indeterminate diastolic 01/11: consolidate diltiazem  to 360mg  daily and metoprolol  75mg  bid. Still on high low [place] 13L/[place] CT chest, no PE, (+)concern pneumonia so started abx, later up to [facility] 50L/min. Will ask pulmonary to review CT, question may need bronch - does not - duonebs q4h, add pulmicort  01/12: hypercarbic, on BiPAP overnight and  back on HHFNC through the day, repeating BMP this afternoon. CO2 narcosis mild confusion  01/13: more confused/agitated overnight, unresponsive this morning, transferring to ICU  anticipate intubation pr potentially needs precedex + BiPAP  Patient states that he is not taking most of his medications including Eliquis  for a while due to insurance issue. (He is not able to recall some medications he's not been taking, reports not on inhalers recently, not sure about all cradiac meds but reports he's been on his eliquis ) He developed SOB in the past 2 days, which has been progressively worsening. He has cough with clear mucus production, no chest pain, fever or chills. Patient is normally using 2 L oxygen  at baseline, but was found to have oxygen  desaturation to 76% on home level 2 L oxygen , initially started 6-8 L oxygen , still has respiratory distress and difficulty speaking in full sentence, then started BiPAP with improvement.  BiPAP is weaned off now, currently on 6 L oxygen  with 100% of saturation and bipap at night.    Acute Encephalopathy due to CO2 narcosis Hypoventilation --improved with brief BiPAP --BiPAP PRN and nightly, will need Bipap at discharge   Acute on chronic respiratory failure with hypoxia Acute hypercarbic respiratory failure   On 2L O2 at baseline --Likely due to combination of COPD and CHF exacerbation.   --transitioned from heated hf to 5L [place] today --Continue supplemental O2 to keep sats >=90%, wean as tolerated  COPD with acute exacerbation  CAP Ryan- will need Bipap at discharge.  Leucocytosis, likely reactive  --received 8 days of IV solumedrol --cont DuoNeb scheduled, Prednisone  40 mg daily  Afib RVR/Atrial flutter  CAD-Nonobstructive CAD on cath 01/2023  Hypertension Acute on chronic diastolic CHF (congestive heart failure), now euvolemic -- 07/16/2024 Echo EF 60-65 mod LVH indeterminate diastolic --switched  from Eliquis  to Xarelto  to help with compliance --S/p IV lasix  --cont Cardizem  360 mg daily, Lopressor  to 100 mg BID, digoxin  0.25 mg daily --Had rapid atrial flutter last night, requiring IV lopressor . --Discussed with cards-  poor candidate for TEE/CV --If no improvement, need to consider amiodarone .    Acquired hypothyroidism Synthroid    Tobacco abuse Nicotine  patch Did counseling about importance of quitting smoking   Class 3 obesity based on BMI: Body mass index is 45.58 kg/m.  Hyperglycemia 2/2 steroid use --ACHS and SSI  Deconditioning Continue PT and OT    DVT prophylaxis: On:Xarelto   Code Status: Full code  Family Communication: daughter updated at bedside today Level of care: Progressive Dispo:   The patient is from: home Anticipated d/c is to: home with home health Anticipated d/c date is: 2-3 days   Subjective and Interval History:  Pt seen and examined  Slowly improving. Remains in the bed most of the times Ambulatory at home. Cough present Tolerating bipap   Objective: Vitals:   07/22/24 0632 07/22/24 0707 07/22/24 0807 07/22/24 0820  BP: 99/60 122/69  133/64  Pulse: 92 88  76  Resp:  16    Temp:    97.9 F (36.6 C)  TempSrc:    Oral  SpO2: 97%  93% 92%  Weight:      Height:        Intake/Output Summary (Last 24 hours) at 07/22/2024 0933 Last data filed at 07/22/2024 0410 Gross per 24 hour  Intake 240 ml  Output 1600 ml  Net -1360 ml   Filed Weights   07/13/24 1904 07/19/24 0849 07/20/24 2234  Weight: (!) 140 kg 128 kg 126.7 kg    Examination:   Constitutional: NAD, alert, oriented HEENT: conjunctivae and lids normal, EOMI CV: No cyanosis.   RESP: normal respiratory effort, on 5L Extremities: No effusions, edema in BLE SKIN: warm, dry Neuro: II - XII grossly intact.   Psych: Normal mood and affect.     Data Reviewed: I have personally reviewed labs and imaging studies  Time spent: 50 minutes  Ryan JONELLE Overcast, MD [facility] If 7PM-7AM, please contact night-coverage 07/22/2024, 9:33 AM   "

## 2024-07-22 NOTE — TOC Progression Note (Signed)
 Transition of Care [facility]) - Progression Note    Patient Details  Name: Ryan Hatfield MRN: [id number] Date of Birth: 1965-11-16  [facility] of [facility]) CM/SW Contact  Victory Jackquline RAMAN, RN Phone Number: 07/22/2024, 11:25 AM  Clinical Narrative:    RNCM received a call and email from Jermaine with [employer] informing me that he needed the NIV Rx signed by the MD and the DME verbiage Co-Signed by the MD. Once received he would process the order for the BIPAP. He will call me back to let me know when it will be delivered to the patient's bedside. RNCM will continue to follow for discharge planning needs.                    Expected Discharge Plan and Services                                               Social Drivers of Health (SDOH) Interventions SDOH Screenings   Food Insecurity: No Food Insecurity (07/20/2024)  Housing: Low Risk (01/30/2023)  Transportation Needs: No Transportation Needs (01/30/2023)  Utilities: Not At Risk (07/16/2024)  Depression (PHQ2-9): Medium Risk (04/02/2023)  Tobacco Use: High Risk (07/13/2024)    Readmission Risk Interventions     No data to display

## 2024-07-23 DIAGNOSIS — I4892 Unspecified atrial flutter: Secondary | ICD-10-CM | POA: Diagnosis not present

## 2024-07-23 DIAGNOSIS — J9621 Acute and chronic respiratory failure with hypoxia: Secondary | ICD-10-CM | POA: Diagnosis not present

## 2024-07-23 DIAGNOSIS — I10 Essential (primary) hypertension: Secondary | ICD-10-CM | POA: Diagnosis not present

## 2024-07-23 DIAGNOSIS — Z720 Tobacco use: Secondary | ICD-10-CM | POA: Diagnosis not present

## 2024-07-23 LAB — COMPREHENSIVE METABOLIC PANEL WITH GFR
ALT: 35 U/L (ref 0–44)
AST: 16 U/L (ref 15–41)
Albumin: 3.5 g/dL (ref 3.5–5.0)
Alkaline Phosphatase: 53 U/L (ref 38–126)
Anion gap: 7 (ref 5–15)
BUN: 33 mg/dL — ABNORMAL HIGH (ref 6–20)
CO2: 31 mmol/L (ref 22–32)
Calcium: 8.9 mg/dL (ref 8.9–10.3)
Chloride: 100 mmol/L (ref 98–111)
Creatinine, Ser: 1.03 mg/dL (ref 0.61–1.24)
GFR, Estimated: >60 mL/min
Glucose, Bld: 119 mg/dL — ABNORMAL HIGH (ref 70–99)
Potassium: 4 mmol/L (ref 3.5–5.1)
Sodium: 138 mmol/L (ref 135–145)
Total Bilirubin: 0.6 mg/dL (ref 0.0–1.2)
Total Protein: 6.4 g/dL — ABNORMAL LOW (ref 6.5–8.1)

## 2024-07-23 LAB — MAGNESIUM: Magnesium: 2.6 mg/dL — ABNORMAL HIGH (ref 1.7–2.4)

## 2024-07-23 LAB — CBC WITH DIFFERENTIAL/PLATELET
Abs Immature Granulocytes: 0.73 K/uL — ABNORMAL HIGH (ref 0.00–0.07)
Basophils Absolute: 0.1 K/uL (ref 0.0–0.1)
Basophils Relative: 0 %
Eosinophils Absolute: 0.1 K/uL (ref 0.0–0.5)
Eosinophils Relative: 0 %
HCT: 47.7 % (ref 39.0–52.0)
Hemoglobin: 15.9 g/dL (ref 13.0–17.0)
Immature Granulocytes: 4 %
Lymphocytes Relative: 24 %
Lymphs Abs: 4.7 K/uL — ABNORMAL HIGH (ref 0.7–4.0)
MCH: 29.6 pg (ref 26.0–34.0)
MCHC: 33.3 g/dL (ref 30.0–36.0)
MCV: 88.7 fL (ref 80.0–100.0)
Monocytes Absolute: 1.2 K/uL — ABNORMAL HIGH (ref 0.1–1.0)
Monocytes Relative: 6 %
Neutro Abs: 13.1 K/uL — ABNORMAL HIGH (ref 1.7–7.7)
Neutrophils Relative %: 66 %
Platelets: 298 K/uL (ref 150–400)
RBC: 5.38 MIL/uL (ref 4.22–5.81)
RDW: 12.5 % (ref 11.5–15.5)
WBC: 19.8 K/uL — ABNORMAL HIGH (ref 4.0–10.5)
nRBC: 0 % (ref 0.0–0.2)

## 2024-07-23 LAB — GLUCOSE, CAPILLARY
Glucose-Capillary: 126 mg/dL — ABNORMAL HIGH (ref 70–99)
Glucose-Capillary: 234 mg/dL — ABNORMAL HIGH (ref 70–99)
Glucose-Capillary: 224 mg/dL — ABNORMAL HIGH (ref 70–99)
Glucose-Capillary: 171 mg/dL — ABNORMAL HIGH (ref 70–99)

## 2024-07-23 LAB — DIGOXIN LEVEL: Digoxin Level: 0.8 ng/mL (ref 0.8–2.0)

## 2024-07-23 MED ORDER — LOSARTAN POTASSIUM 25 MG PO TABS
25.0000 mg | ORAL_TABLET | Freq: Every day | ORAL | Status: DC
  Administered 2024-07-23 – 2024-07-25 (×3): 25 mg via ORAL
  Filled 2024-07-23 (×3): qty 1

## 2024-07-23 NOTE — Progress Notes (Signed)
 " Progress Note   Patient: Ryan Hatfield FMW:[id number] DOB: 11-21-1965 DOA: 07/13/2024     10 DOS: the patient was seen and examined on 07/23/2024   Brief hospital course:  Ryan Hatfield is a 59 y.o. male with medical history significant of dCHF, HTN, HLD, COPD on 2L oxygen , hypothyroidism, GERD, depression with anxiety, A-fib on Eliquis , morbid obesity, smoker, alcohol abuse in remission, medication noncompliance, who presents with SOB.    Patient states that he is not taking most of his medications including Eliquis  for a while due to insurance issue. (He is not able to recall some medications he's not been taking, reports not on inhalers recently, not sure about all cradiac meds but reports he's been on his eliquis ) He developed SOB in the past 2 days, which has been progressively worsening. He has cough with clear mucus production, no chest pain, fever or chills. Patient is normally using 2 L oxygen  at baseline, but was found to have oxygen  desaturation to 76% on home level 2 L oxygen , initially started 6-8 L oxygen , still has respiratory distress and difficulty speaking in full sentence, then started BiPAP with improvement.  BiPAP is weaned off now, currently on 6 L oxygen  with 100% of saturation.    01/07:  to ED. BNP 527, GFR> 60, troponin 20 --> 21, negative PCR for COVID, flu and RSV. Temperature normal, blood pressure 158/62, heart rate 101 --> 84, RR 21.  Chest x-ray showed cardiomegaly and bibasilar atelectasis.  Patient is admitted to PCU as inpatient. Needing HFNC up to 15 L/min 01/08: BiPAP overnight w/ improvement in Afib rate. Off BiPAP today per patient request, remains on high flow [place] 15 L/min. Continue diuresis. UOP yesterday -1400 mL 01/09: HFNC 11 L/min, no BiPAP overnight. UOP yesterday -1500 mL. Back into Afib RVR, dilt bolus ineffective, starting drip, Echo ordered. D/w cardiology, plan to see him tomorrow but they are aware of his case in case decompensation / emergent  cardioversion needed 01/10: Cardiology following --> dc amiodarone  (pt now notes spotty compliance w/ home Eliquis ), continue diltiazem  infusion, changed to metoprolol  tartrate 37.5 mg q6h, holding lasix  for today. Echo EF 60-65 mod LVH indeterminate diastolic 01/11: consolidate diltiazem  to 360mg  daily and metoprolol  75mg  bid. Still on high low [place] 13L/[place] CT chest, no PE, (+)concern pneumonia so started abx, later up to [facility] 50L/min. Will ask pulmonary to review CT, question may need bronch - does not - duonebs q4h, add pulmicort  01/12: hypercarbic, on BiPAP overnight and  back on HHFNC through the day, repeating BMP this afternoon. CO2 narcosis mild confusion  01/13: more confused/agitated overnight, unresponsive this morning, transferring to ICU anticipate intubation pr potentially needs precedex + BiPAP   Seen and examined at the bedside.  No new complaints..  Remains on nasal cannula.  Assessment and Plan:  Acute Encephalopathy due to CO2 narcosis Hypoventilation --improved with brief BiPAP.  Back to baseline mental status. --BiPAP PRN and nightly   Acute on chronic respiratory failure with hypoxia Acute hypercarbic respiratory failure   On 2L O2 at baseline --Likely due to combination of COPD and CHF exacerbation.   --transitioned from heated hf to 3L  which is his baseline --Continue supplemental O2 to keep sats >=90%, wean as tolerated -- May benefit from a trilogy on discharge   COPD with acute exacerbation  Wheezing on exam.  Improved --received 8 days of IV solumedrol --d/c IV solumedrol today --cont DuoNeb scheduled   Community acquired PNA --cont ceftriaxone  and azithro (  total 5 doses)   OSA Suspect may need BiPAP at bedtime long-term   Acute on chronic diastolic CHF (congestive heart failure)  Patient presenting w/ leg edema, indicating possible CHF exacerbation.  His BNP was 527 which is likely falsely low due to morbid obesity.  Last echo 2024 - EF 60-65%,  grade 1 diast df  07/16/2024 Echo EF 60-65 mod LVH indeterminate diastolic appears relatively euvolemic on exam although volume status is difficult to assess given body habitus  --s/p IV lasix  --diuretics currently on hold   Paroxysmal Afib with RVR --switched from Eliquis  to Xarelto  to help with compliance -- Appreciate cardio input --cont Cardizem  360 mg dailyAnd Lopressor  to 100 mg BID --Added digoxin  0.25 IV load x 2, start 0.125 tomorrow oral.  Digoxin  level within normal limits   CAD  - Nonobstructive CAD on cath 01/2023  --cont lipitor   Elevated troponin Likely demand ischemia from rapid A-fib   Essential hypertension --BP varied widely   Leukocytosis Steroid effect Monitor CBC periodically / as needed   Acquired hypothyroidism Continue Synthroid    Tobacco abuse Nicotine  patch Did counseling about importance of quitting smoking   Class 3 obesity based on BMI:  Body mass index is 45.58 kg/m. Lifestyle modification and exercise discussed with patient in detail   Hyperglycemia 2/2 steroid use --ACHS and SSI. --Systemic steroids have been discontinued        Subjective: No new complaints.  Son at the bedside.  Physical Exam: Vitals:   07/23/24 0841 07/23/24 0955 07/23/24 1002 07/23/24 1213  BP:    124/61  Pulse:    86  Resp: 14  16   Temp:    98 F (36.7 C)  TempSrc:    Oral  SpO2:  93%  93%  Weight:      Height:       Constitutional: NAD, alert, oriented, morbidly obese HEENT: conjunctivae and lids normal, EOMI CV: No cyanosis.   RESP: Bilateral air entry, no wheezes or rhonchi Extremities: No effusions, edema in BLE SKIN: warm, dry Neuro: II - XII grossly intact.   Psych: Normal mood and affect.     Data Reviewed: Labs reviewed.  BUN 33, creatinine 1.03, digoxin  0.8, white count 19.8 Labs reviewed  Family Communication: Plan of care was discussed with patient and his son at the bedside.  All questions and concerns have been  addressed.  Disposition: Status is: Inpatient Remains inpatient appropriate because: Discharge planning  Planned Discharge Destination: Home with Home Health    Time spent: 50 minutes  Author: Aimee Somerset, MD 07/23/2024 1:05 PM  For on call review [email or web address].  "

## 2024-07-23 NOTE — Plan of Care (Signed)
  Problem: Education: Goal: Knowledge of disease or condition will improve Outcome: Progressing Goal: Knowledge of the prescribed therapeutic regimen will improve Outcome: Progressing Goal: Individualized Educational Video(s) Outcome: Progressing   Problem: Activity: Goal: Ability to tolerate increased activity will improve Outcome: Progressing Goal: Will verbalize the importance of balancing activity with adequate rest periods Outcome: Progressing   Problem: Respiratory: Goal: Ability to maintain a clear airway will improve Outcome: Progressing Goal: Levels of oxygenation will improve Outcome: Progressing Goal: Ability to maintain adequate ventilation will improve Outcome: Progressing   Problem: Education: Goal: Knowledge of General Education information will improve Description: Including pain rating scale, medication(s)/side effects and non-pharmacologic comfort measures Outcome: Progressing   Problem: Health Behavior/Discharge Planning: Goal: Ability to manage health-related needs will improve Outcome: Progressing   Problem: Clinical Measurements: Goal: Ability to maintain clinical measurements within normal limits will improve Outcome: Progressing Goal: Will remain free from infection Outcome: Progressing Goal: Diagnostic test results will improve Outcome: Progressing Goal: Respiratory complications will improve Outcome: Progressing Goal: Cardiovascular complication will be avoided Outcome: Progressing   Problem: Activity: Goal: Risk for activity intolerance will decrease Outcome: Progressing   Problem: Nutrition: Goal: Adequate nutrition will be maintained Outcome: Progressing   Problem: Coping: Goal: Level of anxiety will decrease Outcome: Progressing   Problem: Elimination: Goal: Will not experience complications related to bowel motility Outcome: Progressing Goal: Will not experience complications related to urinary retention Outcome: Progressing    Problem: Pain Managment: Goal: General experience of comfort will improve and/or be controlled Outcome: Progressing   Problem: Safety: Goal: Ability to remain free from injury will improve Outcome: Progressing   Problem: Skin Integrity: Goal: Risk for impaired skin integrity will decrease Outcome: Progressing   Problem: Education: Goal: Ability to describe self-care measures that may prevent or decrease complications (Diabetes Survival Skills Education) will improve Outcome: Progressing Goal: Individualized Educational Video(s) Outcome: Progressing   Problem: Coping: Goal: Ability to adjust to condition or change in health will improve Outcome: Progressing   Problem: Fluid Volume: Goal: Ability to maintain a balanced intake and output will improve Outcome: Progressing   Problem: Health Behavior/Discharge Planning: Goal: Ability to identify and utilize available resources and services will improve Outcome: Progressing Goal: Ability to manage health-related needs will improve Outcome: Progressing   Problem: Metabolic: Goal: Ability to maintain appropriate glucose levels will improve Outcome: Progressing   Problem: Nutritional: Goal: Maintenance of adequate nutrition will improve Outcome: Progressing Goal: Progress toward achieving an optimal weight will improve Outcome: Progressing   Problem: Skin Integrity: Goal: Risk for impaired skin integrity will decrease Outcome: Progressing   Problem: Tissue Perfusion: Goal: Adequacy of tissue perfusion will improve Outcome: Progressing

## 2024-07-23 NOTE — Progress Notes (Addendum)
 "  Cardiology Progress Note   Patient Name: Ryan Hatfield Date of Encounter: 07/23/2024  Primary Cardiologist: Redell Cave, MD  Subjective   Feels well this morning.  Remains on oxygen  at 3 l/min via nasal cannula, which he says is his typical home dose.  Lying flat without dyspnea.  Notes some pleuritic chest pain with coughing.  Son at bedside. Objective   Inpatient Medications    Scheduled Meds:  atorvastatin   40 mg Oral Daily   budesonide  (PULMICORT ) nebulizer solution  0.5 mg Nebulization BID   Chlorhexidine  Gluconate Cloth  6 each Topical QHS   digoxin   0.25 mg Oral Daily   diltiazem   360 mg Oral Daily   folic acid   1 mg Oral Daily   insulin  aspart  0-20 Units Subcutaneous TID WC   insulin  aspart  0-5 Units Subcutaneous QHS   ipratropium-albuterol   3 mL Nebulization BID   levothyroxine   50 mcg Oral Daily   metoprolol  tartrate  100 mg Oral BID   nicotine   21 mg Transdermal Daily   pantoprazole   40 mg Oral Daily   polyethylene glycol  17 g Oral Daily   predniSONE   40 mg Oral Q breakfast   rivaroxaban   20 mg Oral Q supper   [START ON 07/25/2024] thiamine  (VITAMIN B1) injection  100 mg Intravenous Q24H   Continuous Infusions:  thiamine  (VITAMIN B1) injection     PRN Meds: acetaminophen , albuterol , dextromethorphan-guaiFENesin , magnesium  hydroxide, nitroGLYCERIN , senna-docusate, sodium phosphate    Vital Signs    Vitals:   07/22/24 1947 07/22/24 2024 07/23/24 0015 07/23/24 0341  BP:  (!) 145/79 (!) 153/84 (!) 153/77  Pulse:  85 86 89  Resp:  20 20 20   Temp:  (!) 96 F (35.6 C)  98.6 F (37 C)  TempSrc:  Oral    SpO2: 95% 97% 95% 95%  Weight:      Height:        Intake/Output Summary (Last 24 hours) at 07/23/2024 0835 Last data filed at 07/22/2024 1300 Gross per 24 hour  Intake 120 ml  Output --  Net 120 ml   Filed Weights   07/13/24 1904 07/19/24 0849 07/20/24 2234  Weight: (!) 140 kg 128 kg 126.7 kg    Physical Exam   GEN: Obese, in no acute  distress.  HEENT: Grossly normal.  Neck: Supple, obese, difficult to gauge JVP.  No bruits or masses. Cardiac: Irregularly irregular, 2/6 systolic murmur heard throughout, no rubs or gallops. No clubbing, cyanosis, edema.  Radials 2+, DP/PT 2+ and equal bilaterally.  Respiratory:  Respirations regular and unlabored, clear to auscultation bilaterally. GI: Soft, nontender, nondistended, BS + x 4. MS: no deformity or atrophy. Skin: warm and dry, no rash. Neuro:  Strength and sensation are intact. Psych: AAOx3.  Normal affect.  Labs    Chemistry Recent Labs  Lab 07/19/24 1625 07/20/24 0334 07/23/24 0506  NA 139 139 138  K 4.6 4.3 4.0  CL 91* 95* 100  CO2 38* 37* 31  GLUCOSE 302* 183* 119*  BUN 33* 34* 33*  CREATININE 1.12 1.14 1.03  CALCIUM  9.0 8.7* 8.9  PROT  --   --  6.4*  ALBUMIN  --   --  3.5  AST  --   --  16  ALT  --   --  35  ALKPHOS  --   --  53  BILITOT  --   --  0.6  GFRNONAA >60 >60 >60  ANIONGAP 11 7 7  Hematology Recent Labs  Lab 07/18/24 0435 07/20/24 0334 07/23/24 0506  WBC 18.9* 20.8* 19.8*  RBC 4.97 5.24 5.38  HGB 14.4 15.4 15.9  HCT 49.2 49.0 47.7  MCV 99.0 93.5 88.7  MCH 29.0 29.4 29.6  MCHC 29.3* 31.4 33.3  RDW 12.5 12.0 12.5  PLT 265 311 298    Cardiac Enzymes  Recent Labs  Lab 07/13/24 1835 07/13/24 2212 07/14/24 0448 07/15/24 0439  TRNPT 20* 21* 16 22*    BNP    Component Value Date/Time   BNP 123.1 (H) 01/29/2023 1422    ProBNP    Component Value Date/Time   PROBNP 527.0 (H) 07/13/2024 1835   PROBNP 14.0 05/29/2015 1000   Lipids  Lab Results  Component Value Date   CHOL 138 02/17/2024   HDL 26 (L) 02/17/2024   LDLCALC 82 02/17/2024   LDLDIRECT 123.0 06/11/2015   TRIG 171 (H) 02/17/2024   CHOLHDL 5.3 (H) 02/17/2024    [id number]  Lab Results  Component Value Date   HGBA1C 5.8 (H) 07/19/2024    Radiology    No results found.   Telemetry    Afib/flutter, [phone] - Personally Reviewed  Cardiac Studies    2D Echocardiogram 1.10.2026  1. Left ventricular ejection fraction, by estimation, is 60 to 65%. Left  ventricular ejection fraction by PLAX is 63 %. The left ventricle has  normal function. The left ventricle has no regional wall motion  abnormalities. There is moderate concentric  left ventricular hypertrophy. Left ventricular diastolic parameters are  indeterminate.   2. Right ventricular systolic function is normal. The right ventricular  size is normal.   3. The mitral valve is normal in structure. No evidence of mitral valve  regurgitation. No evidence of mitral stenosis.   4. The aortic valve is tricuspid. Aortic valve regurgitation is not  visualized. No aortic stenosis is present.   5. The inferior vena cava is dilated in size with >50% respiratory  variability, suggesting right atrial pressure of 8 mmHg.  _____________   Patient Profile     59 y.o. male with a hx of nonobstructive CAD, chronic HFpEF, atrial flutter s/p TEE cardioversion 01/2023, hypertension, hyperlipidemia, OSA, COPD, gout, and tobacco abuse who was admitted 1/7 with shortness of breath secondary to COPD exacerbation, and who is being seen for the ongoing management of atrial fibrillation with RVR.   Assessment & Plan    1.  Acute on chronic hypoxic respiratory failure/acute exacerbation of COPD: Admitted with January 7 with COPD flare and subsequently developed atrial fibrillation/flutter (see below).  Has completed antibiotics.  Remains on nebulizers and oral steroids.  Overall improved and back on usual home dose of supplemental oxygen .  2.  Paroxysmal atrial fibrillation/flutter with rapid ventricular response: Prior history of A-fib complicated by noncompliance with anticoagulation in the outpatient setting.  Developed recurrent A-fib/flutter during this hospitalization, which has been well rate controlled and asymptomatic on beta-blocker, calcium  channel blocker, and digoxin .  He was previously on  Eliquis  though in setting of noncompliance (was only taking once a day), this was transition to Xarelto  this admission (CHA2DS2-VASc equals 3).  Plan for 3 weeks of oral anticoagulation with outpatient follow-up and subsequent cardioversion.  He is a poor candidate for TEE due to neck size/airway.  3.  Chronic HFpEF: Echo this admission with an EF of 60 to 65% with moderate concentric LVH.  Body habitus makes exam challenging though he is able to lie flat with stable respiratory status.  Not currently  requiring a diuretic.  Heart rate stable on beta-blocker, diltiazem , and digoxin .  Blood pressures have been elevated.  Will add ARB.  Consider SGLT2 inhibitor if not cost prohibitive.  4.  Coronary artery disease: History of nonobstructive CAD.  Troponins this admission minimally elevated in the setting of above.  No plan for ischemic evaluation.  Continue beta-blocker, statin.  No aspirin  in the setting of Xarelto .  5.  Tobacco abuse: Cessation advised.  6.  Primary hypertension: Pressures elevated.  Adding ARB.  7.  Hyperlipidemia: LDL of 82 last year.  Continue statin therapy.  Signed, Lonni Meager, NP  07/23/2024, 8:35 AM    For questions or updates, please contact   Please consult [email or web address] for contact info under Cardiology/STEMI.  "

## 2024-07-23 NOTE — Plan of Care (Signed)
 VSS. 2L [place]. Family at bedside this shift. NIV delivered to bedside and patient and family educated. PRN Tylenol  given. See MAR. Cards saw patient. See note.  Problem: Education: Goal: Knowledge of disease or condition will improve Outcome: Progressing Goal: Knowledge of the prescribed therapeutic regimen will improve Outcome: Progressing Goal: Individualized Educational Video(s) Outcome: Progressing   Problem: Activity: Goal: Ability to tolerate increased activity will improve Outcome: Progressing Goal: Will verbalize the importance of balancing activity with adequate rest periods Outcome: Progressing   Problem: Respiratory: Goal: Ability to maintain a clear airway will improve Outcome: Progressing Goal: Levels of oxygenation will improve Outcome: Progressing Goal: Ability to maintain adequate ventilation will improve Outcome: Progressing   Problem: Education: Goal: Knowledge of General Education information will improve Description: Including pain rating scale, medication(s)/side effects and non-pharmacologic comfort measures Outcome: Progressing   Problem: Health Behavior/Discharge Planning: Goal: Ability to manage health-related needs will improve Outcome: Progressing   Problem: Clinical Measurements: Goal: Ability to maintain clinical measurements within normal limits will improve Outcome: Progressing Goal: Will remain free from infection Outcome: Progressing Goal: Diagnostic test results will improve Outcome: Progressing Goal: Respiratory complications will improve Outcome: Progressing Goal: Cardiovascular complication will be avoided Outcome: Progressing   Problem: Activity: Goal: Risk for activity intolerance will decrease Outcome: Progressing   Problem: Nutrition: Goal: Adequate nutrition will be maintained Outcome: Progressing   Problem: Coping: Goal: Level of anxiety will decrease Outcome: Progressing   Problem: Elimination: Goal: Will not  experience complications related to bowel motility Outcome: Progressing Goal: Will not experience complications related to urinary retention Outcome: Progressing   Problem: Pain Managment: Goal: General experience of comfort will improve and/or be controlled Outcome: Progressing   Problem: Safety: Goal: Ability to remain free from injury will improve Outcome: Progressing   Problem: Skin Integrity: Goal: Risk for impaired skin integrity will decrease Outcome: Progressing   Problem: Education: Goal: Ability to describe self-care measures that may prevent or decrease complications (Diabetes Survival Skills Education) will improve Outcome: Progressing Goal: Individualized Educational Video(s) Outcome: Progressing   Problem: Coping: Goal: Ability to adjust to condition or change in health will improve Outcome: Progressing   Problem: Fluid Volume: Goal: Ability to maintain a balanced intake and output will improve Outcome: Progressing   Problem: Health Behavior/Discharge Planning: Goal: Ability to identify and utilize available resources and services will improve Outcome: Progressing Goal: Ability to manage health-related needs will improve Outcome: Progressing   Problem: Metabolic: Goal: Ability to maintain appropriate glucose levels will improve Outcome: Progressing   Problem: Nutritional: Goal: Maintenance of adequate nutrition will improve Outcome: Progressing Goal: Progress toward achieving an optimal weight will improve Outcome: Progressing   Problem: Skin Integrity: Goal: Risk for impaired skin integrity will decrease Outcome: Progressing   Problem: Tissue Perfusion: Goal: Adequacy of tissue perfusion will improve Outcome: Progressing

## 2024-07-24 DIAGNOSIS — J9621 Acute and chronic respiratory failure with hypoxia: Secondary | ICD-10-CM | POA: Diagnosis not present

## 2024-07-24 LAB — CBC
HCT: 52.1 % — ABNORMAL HIGH (ref 39.0–52.0)
Hemoglobin: 16.9 g/dL (ref 13.0–17.0)
MCH: 29.3 pg (ref 26.0–34.0)
MCHC: 32.4 g/dL (ref 30.0–36.0)
MCV: 90.3 fL (ref 80.0–100.0)
Platelets: 351 K/uL (ref 150–400)
RBC: 5.77 MIL/uL (ref 4.22–5.81)
RDW: 12.5 % (ref 11.5–15.5)
WBC: 25.7 K/uL — ABNORMAL HIGH (ref 4.0–10.5)
nRBC: 0 % (ref 0.0–0.2)

## 2024-07-24 LAB — BASIC METABOLIC PANEL WITH GFR
Anion gap: 9 (ref 5–15)
BUN: 28 mg/dL — ABNORMAL HIGH (ref 6–20)
CO2: 32 mmol/L (ref 22–32)
Calcium: 9.2 mg/dL (ref 8.9–10.3)
Chloride: 98 mmol/L (ref 98–111)
Creatinine, Ser: 1.04 mg/dL (ref 0.61–1.24)
GFR, Estimated: >60 mL/min
Glucose, Bld: 122 mg/dL — ABNORMAL HIGH (ref 70–99)
Potassium: 4.5 mmol/L (ref 3.5–5.1)
Sodium: 138 mmol/L (ref 135–145)

## 2024-07-24 LAB — GLUCOSE, CAPILLARY
Glucose-Capillary: 135 mg/dL — ABNORMAL HIGH (ref 70–99)
Glucose-Capillary: 152 mg/dL — ABNORMAL HIGH (ref 70–99)
Glucose-Capillary: 265 mg/dL — ABNORMAL HIGH (ref 70–99)
Glucose-Capillary: 130 mg/dL — ABNORMAL HIGH (ref 70–99)
Glucose-Capillary: 136 mg/dL — ABNORMAL HIGH (ref 70–99)

## 2024-07-24 LAB — MAGNESIUM: Magnesium: 2.6 mg/dL — ABNORMAL HIGH (ref 1.7–2.4)

## 2024-07-24 MED ORDER — DILTIAZEM HCL ER COATED BEADS 120 MG PO CP24
240.0000 mg | ORAL_CAPSULE | Freq: Every day | ORAL | Status: DC
  Administered 2024-07-25: 240 mg via ORAL
  Filled 2024-07-24: qty 2

## 2024-07-24 MED ORDER — MELATONIN 5 MG PO TABS
5.0000 mg | ORAL_TABLET | Freq: Every day | ORAL | Status: DC
  Administered 2024-07-24: 5 mg via ORAL
  Filled 2024-07-24: qty 1

## 2024-07-24 NOTE — Progress Notes (Signed)
 Pt refused morning respiratory treatments I told him I would come back in a couple hours to see if he changes his mind

## 2024-07-24 NOTE — Plan of Care (Signed)
 2L [place]. Patient HR elevated this AM. Scheduled medications helped HR. Family at bedside this shift. BP low at around noon vitals MD made aware. Meds adjusted. Patient may possibly d/c tomorrow.  Problem: Education: Goal: Knowledge of disease or condition will improve Outcome: Progressing Goal: Knowledge of the prescribed therapeutic regimen will improve Outcome: Progressing Goal: Individualized Educational Video(s) Outcome: Progressing   Problem: Activity: Goal: Ability to tolerate increased activity will improve Outcome: Progressing Goal: Will verbalize the importance of balancing activity with adequate rest periods Outcome: Progressing   Problem: Respiratory: Goal: Ability to maintain a clear airway will improve Outcome: Progressing Goal: Levels of oxygenation will improve Outcome: Progressing Goal: Ability to maintain adequate ventilation will improve Outcome: Progressing   Problem: Education: Goal: Knowledge of General Education information will improve Description: Including pain rating scale, medication(s)/side effects and non-pharmacologic comfort measures Outcome: Progressing   Problem: Health Behavior/Discharge Planning: Goal: Ability to manage health-related needs will improve Outcome: Progressing   Problem: Clinical Measurements: Goal: Ability to maintain clinical measurements within normal limits will improve Outcome: Progressing Goal: Will remain free from infection Outcome: Progressing Goal: Diagnostic test results will improve Outcome: Progressing Goal: Respiratory complications will improve Outcome: Progressing Goal: Cardiovascular complication will be avoided Outcome: Progressing   Problem: Activity: Goal: Risk for activity intolerance will decrease Outcome: Progressing   Problem: Nutrition: Goal: Adequate nutrition will be maintained Outcome: Progressing   Problem: Coping: Goal: Level of anxiety will decrease Outcome: Progressing   Problem:  Elimination: Goal: Will not experience complications related to bowel motility Outcome: Progressing Goal: Will not experience complications related to urinary retention Outcome: Progressing   Problem: Pain Managment: Goal: General experience of comfort will improve and/or be controlled Outcome: Progressing   Problem: Safety: Goal: Ability to remain free from injury will improve Outcome: Progressing   Problem: Skin Integrity: Goal: Risk for impaired skin integrity will decrease Outcome: Progressing   Problem: Education: Goal: Ability to describe self-care measures that may prevent or decrease complications (Diabetes Survival Skills Education) will improve Outcome: Progressing Goal: Individualized Educational Video(s) Outcome: Progressing   Problem: Coping: Goal: Ability to adjust to condition or change in health will improve Outcome: Progressing   Problem: Fluid Volume: Goal: Ability to maintain a balanced intake and output will improve Outcome: Progressing   Problem: Health Behavior/Discharge Planning: Goal: Ability to identify and utilize available resources and services will improve Outcome: Progressing Goal: Ability to manage health-related needs will improve Outcome: Progressing   Problem: Metabolic: Goal: Ability to maintain appropriate glucose levels will improve Outcome: Progressing   Problem: Nutritional: Goal: Maintenance of adequate nutrition will improve Outcome: Progressing Goal: Progress toward achieving an optimal weight will improve Outcome: Progressing   Problem: Skin Integrity: Goal: Risk for impaired skin integrity will decrease Outcome: Progressing   Problem: Tissue Perfusion: Goal: Adequacy of tissue perfusion will improve Outcome: Progressing

## 2024-07-24 NOTE — Progress Notes (Signed)
 " Progress Note   Patient: Ryan Hatfield FMW:[id number] DOB: 08-22-1965 DOA: 07/13/2024     11 DOS: the patient was seen and examined on 07/24/2024   Brief hospital course:  Ryan Hatfield is a 59 y.o. male with medical history significant of dCHF, HTN, HLD, COPD on 2L oxygen , hypothyroidism, GERD, depression with anxiety, A-fib on Eliquis , morbid obesity, smoker, alcohol abuse in remission, medication noncompliance, who presents with SOB.    Patient states that he is not taking most of his medications including Eliquis  for a while due to insurance issue. (He is not able to recall some medications he's not been taking, reports not on inhalers recently, not sure about all cradiac meds but reports he's been on his eliquis ) He developed SOB in the past 2 days, which has been progressively worsening. He has cough with clear mucus production, no chest pain, fever or chills. Patient is normally using 2 L oxygen  at baseline, but was found to have oxygen  desaturation to 76% on home level 2 L oxygen , initially started 6-8 L oxygen , still has respiratory distress and difficulty speaking in full sentence, then started BiPAP with improvement.  BiPAP is weaned off now, currently on 6 L oxygen  with 100% of saturation.    01/07:  to ED. BNP 527, GFR> 60, troponin 20 --> 21, negative PCR for COVID, flu and RSV. Temperature normal, blood pressure 158/62, heart rate 101 --> 84, RR 21.  Chest x-ray showed cardiomegaly and bibasilar atelectasis.  Patient is admitted to PCU as inpatient. Needing HFNC up to 15 L/min 01/08: BiPAP overnight w/ improvement in Afib rate. Off BiPAP today per patient request, remains on high flow [place] 15 L/min. Continue diuresis. UOP yesterday -1400 mL 01/09: HFNC 11 L/min, no BiPAP overnight. UOP yesterday -1500 mL. Back into Afib RVR, dilt bolus ineffective, starting drip, Echo ordered. D/w cardiology, plan to see him tomorrow but they are aware of his case in case decompensation / emergent  cardioversion needed 01/10: Cardiology following --> dc amiodarone  (pt now notes spotty compliance w/ home Eliquis ), continue diltiazem  infusion, changed to metoprolol  tartrate 37.5 mg q6h, holding lasix  for today. Echo EF 60-65 mod LVH indeterminate diastolic 01/11: consolidate diltiazem  to 360mg  daily and metoprolol  75mg  bid. Still on high low [place] 13L/[place] CT chest, no PE, (+)concern pneumonia so started abx, later up to [facility] 50L/min. Will ask pulmonary to review CT, question may need bronch - does not - duonebs q4h, add pulmicort  01/12: hypercarbic, on BiPAP overnight and  back on HHFNC through the day, repeating BMP this afternoon. CO2 narcosis mild confusion  01/13: more confused/agitated overnight, unresponsive this morning, transferring to ICU anticipate intubation pr potentially needs precedex + BiPAP   01/17:Seen and examined at the bedside.  No new complaints..  Remains on nasal cannula  01/18: Sitting up in bed.  BiPAP has been delivered.  No new complaints and ready for discharge in a.m. if stable    Assessment and Plan:   Acute Encephalopathy due to CO2 narcosis Hypoventilation --improved with brief BiPAP.  Back to baseline mental status. --BiPAP PRN and nightly --Patient has a home BiPAP that has been delivered for discharge   Acute on chronic respiratory failure with hypoxia Acute hypercarbic respiratory failure   On 2L O2 at baseline --Likely due to combination of COPD and CHF exacerbation.   --transitioned from heated hf to 3L [place] which is his baseline --Continue supplemental O2 to keep sats >=90%, wean as tolerated -- Will discharge home with  a BiPAP    COPD with acute exacerbation   Improved --received 8 days of IV solumedrol --cont DuoNeb scheduled and as needed    Community acquired PNA -- Completed ceftriaxone  and azithro (total 5 doses)   OSA Suspect may need BiPAP at bedtime long-term   Acute on chronic diastolic CHF (congestive heart failure)   Patient presenting w/ leg edema, indicating possible CHF exacerbation.  His BNP was 527 which is likely falsely low due to morbid obesity.  Last echo 2024 - EF 60-65%, grade 1 diast df  07/16/2024 Echo EF 60-65 mod LVH indeterminate diastolic appears relatively euvolemic on exam although volume status is difficult to assess given body habitus  --s/p IV lasix  --diuretics currently on hold   Paroxysmal Afib with RVR --switched from Eliquis  to Xarelto  to help with compliance -- Appreciate cardio input -- Patient has been on Cardizem  360 mg daily and Lopressor  to 100 mg BID --Will decrease Cardizem  to 240 mg daily due to relative hypotension -- Hold digoxin     CAD  - Nonobstructive CAD on cath 01/2023  --cont lipitor   Elevated troponin Likely demand ischemia from rapid A-fib   Essential hypertension --BP varied widely   Leukocytosis Steroid effect Monitor CBC periodically / as needed   Acquired hypothyroidism Continue Synthroid    Tobacco abuse Nicotine  patch Did counseling about importance of quitting smoking   Class 3 obesity based on BMI:  Body mass index is 45.58 kg/m. Lifestyle modification and exercise discussed with patient in detail   Hyperglycemia 2/2 steroid use --ACHS and SSI. --Systemic steroids have been discontinued             Subjective: Sitting up in bed.  Has no new complaints  Physical Exam: Vitals:   07/24/24 0418 07/24/24 0751 07/24/24 1043 07/24/24 1154  BP: 125/70  116/79 (!) 99/54  Pulse: 89  83 84  Resp: 18 15 18 20   Temp: 97.9 F (36.6 C)  97.8 F (36.6 C) 98.5 F (36.9 C)  TempSrc:      SpO2: 93%  97% 97%  Weight:      Height:       Constitutional: NAD, alert, oriented, morbidly obese HEENT: conjunctivae and lids normal, EOMI CV: No cyanosis.   RESP: Bilateral air entry, no wheezes or rhonchi Extremities: No effusions, edema in BLE SKIN: warm, dry Neuro: II - XII grossly intact.   Psych: Normal mood and affect.         Data Reviewed: Labs reviewed.  BUN 28, creatinine 1.04, magnesium  2.6, white count 25.7  Labs reviewed  Family Communication: Plan of care discussed with patient's daughter Corean over the phone.  All questions and concerns have been addressed.  She verbalizes understanding and agrees with the plan  Disposition: Status is: Inpatient Remains inpatient appropriate because: Discharge planning  Planned Discharge Destination: Home with Home Health    Time spent: 35 minutes  Author: Aimee Somerset, MD 07/24/2024 2:20 PM  For on call review [email or web address].  "

## 2024-07-25 ENCOUNTER — Other Ambulatory Visit: Payer: Self-pay

## 2024-07-25 ENCOUNTER — Other Ambulatory Visit (HOSPITAL_COMMUNITY): Payer: Self-pay

## 2024-07-25 ENCOUNTER — Telehealth (HOSPITAL_COMMUNITY): Payer: Self-pay

## 2024-07-25 DIAGNOSIS — J9621 Acute and chronic respiratory failure with hypoxia: Secondary | ICD-10-CM | POA: Diagnosis not present

## 2024-07-25 DIAGNOSIS — G9341 Metabolic encephalopathy: Secondary | ICD-10-CM | POA: Diagnosis present

## 2024-07-25 LAB — GLUCOSE, CAPILLARY
Glucose-Capillary: 133 mg/dL — ABNORMAL HIGH (ref 70–99)
Glucose-Capillary: 173 mg/dL — ABNORMAL HIGH (ref 70–99)

## 2024-07-25 MED ORDER — DILTIAZEM HCL ER COATED BEADS 240 MG PO CP24
240.0000 mg | ORAL_CAPSULE | Freq: Every day | ORAL | 1 refills | Status: AC
  Filled 2024-07-25: qty 30, 30d supply, fill #0

## 2024-07-25 MED ORDER — LANCET DEVICE MISC
1.0000 | 0 refills | Status: AC
  Filled 2024-07-25: qty 1, fill #0

## 2024-07-25 MED ORDER — DILTIAZEM HCL ER COATED BEADS 120 MG PO CP24: 120.0000 mg | ORAL_CAPSULE | Freq: Once | ORAL | Status: DC

## 2024-07-25 MED ORDER — LOSARTAN POTASSIUM 25 MG PO TABS
25.0000 mg | ORAL_TABLET | Freq: Every day | ORAL | 1 refills | Status: AC
  Filled 2024-07-25: qty 30, 30d supply, fill #0

## 2024-07-25 MED ORDER — COMPRESSOR/NEBULIZER MISC
1.0000 | Freq: Four times a day (QID) | 0 refills | Status: AC | PRN
  Filled 2024-07-25: qty 1, 30d supply, fill #0

## 2024-07-25 MED ORDER — LANCETS MISC
1.0000 | 0 refills | Status: AC
  Filled 2024-07-25: qty 100, 25d supply, fill #0

## 2024-07-25 MED ORDER — RIVAROXABAN 20 MG PO TABS
20.0000 mg | ORAL_TABLET | Freq: Every day | ORAL | 0 refills | Status: AC
  Filled 2024-07-25: qty 30, 30d supply, fill #0

## 2024-07-25 MED ORDER — BLOOD GLUCOSE TEST VI STRP
1.0000 | ORAL_STRIP | 0 refills | Status: AC
  Filled 2024-07-25: qty 100, 25d supply, fill #0

## 2024-07-25 MED ORDER — ALBUTEROL SULFATE (2.5 MG/3ML) 0.083% IN NEBU
2.5000 mg | INHALATION_SOLUTION | RESPIRATORY_TRACT | 12 refills | Status: AC | PRN
  Filled 2024-07-25: qty 75, 5d supply, fill #0

## 2024-07-25 MED ORDER — MOMETASONE FURO-FORMOTEROL FUM 200-5 MCG/ACT IN AERO
1.0000 | INHALATION_SPRAY | Freq: Two times a day (BID) | RESPIRATORY_TRACT | 3 refills | Status: AC
  Filled 2024-07-25: qty 13, 30d supply, fill #0

## 2024-07-25 MED ORDER — METOPROLOL TARTRATE 100 MG PO TABS
100.0000 mg | ORAL_TABLET | Freq: Two times a day (BID) | ORAL | 0 refills | Status: AC
  Filled 2024-07-25: qty 60, 30d supply, fill #0

## 2024-07-25 MED ORDER — METFORMIN HCL ER 500 MG PO TB24
500.0000 mg | ORAL_TABLET | Freq: Every day | ORAL | 0 refills | Status: AC
  Filled 2024-07-25: qty 30, 30d supply, fill #0

## 2024-07-25 MED ORDER — ALBUTEROL SULFATE HFA 108 (90 BASE) MCG/ACT IN AERS
2.0000 | INHALATION_SPRAY | Freq: Four times a day (QID) | RESPIRATORY_TRACT | 2 refills | Status: AC | PRN
  Filled 2024-07-25: qty 6.7, 25d supply, fill #0

## 2024-07-25 MED ORDER — BLOOD GLUCOSE MONITOR SYSTEM W/DEVICE KIT
1.0000 | PACK | 0 refills | Status: AC
  Filled 2024-07-25: qty 1, 30d supply, fill #0

## 2024-07-25 NOTE — Progress Notes (Signed)
 PT Cancellation Note  Patient Details Name: Amun E Mastrangelo MRN: [id number] DOB: January 13, 1966   Cancelled Treatment:    Reason Eval/Treat Not Completed: Other (comment)  Pt in chair, hoping to go home today.  Stated he is moving around in room comfortably with no AD.  Stated no further therapy needs acutely at this time.  Lauraine Gills 07/25/2024, 10:09 AM

## 2024-07-25 NOTE — Discharge Summary (Signed)
 " Physician Discharge Summary   Patient: Ryan Hatfield MRN: [id number] DOB: 06-Dec-1965  Admit date:     07/13/2024  Discharge date: 07/25/24  Discharge Physician: Beza Steppe   PCP: Corwin Antu, FNP   Recommendations at discharge:   Take medications as recommended  Discharge Diagnoses: Principal Problem:   Acute on chronic respiratory failure with hypoxia (HCC) Active Problems:   Acute on chronic diastolic CHF (congestive heart failure) (HCC)   Acute metabolic encephalopathy   Essential hypertension   Atrial fibrillation with rapid ventricular response (HCC)   COPD with acute exacerbation (HCC)   Morbid obesity (HCC)   Acquired hypothyroidism   Tobacco abuse   Hyperglycemia, drug-induced   Leukemoid reaction   Atrial flutter (HCC)   Atrial fibrillation, chronic (HCC)   COPD exacerbation (HCC)   Primary hypertension  Resolved Problems:   * No resolved hospital problems. *  Hospital Course:  Ryan Hatfield is a 59 y.o. male with medical history significant of dCHF, HTN, HLD, COPD on 2L oxygen , hypothyroidism, GERD, depression with anxiety, A-fib on Eliquis , morbid obesity, smoker, alcohol abuse in remission, medication noncompliance, who presents with SOB.   Patient states that he is not taking most of his medications including Eliquis  for a while due to insurance issue.  He developed SOB in the past 2 days, which has been progressively worsening.  He has cough with clear mucus production, no chest pain, fever or chills.  No nausea, vomiting, diarrhea or abdominal pain.  No symptoms of UTI.   Patient is normally using 2 L oxygen  at baseline, but was found to have oxygen  desaturation to 76% on home level 2 L oxygen , initially started 6-8 L oxygen , still has respiratory distress and difficulty speaking in full sentence, then started BiPAP with improvement.  BiPAP is weaned off now, currently on 6 L oxygen  with 100% of saturation.   Data reviewed independently and ED  Course: pt was found to have BNP 527, GFR> 60, troponin 20 --> 21, negative PCR for COVID, flu and RSV. Temperature normal, blood pressure 158/62, heart rate 101 --> 84, RR 21.  Chest x-ray showed cardiomegaly and bibasilar atelectasis.  Patient is admitted to PCU as inpatient.   EKG: I have personally reviewed.  Sinus rhythm, QTc 449.      Assessment and Plan:  Acute Encephalopathy due to CO2 narcosis Acute on chronic respiratory failure with hypoxia/hypercapnia Acute hypercarbic respiratory failure   Chronic respiratory failure on 2L O2 at baseline --Likely due to combination of COPD and CHF exacerbation.   --improved with noninvasive mechanical ventilation - BiPAP.   --transitioned from heated hf to 2L Ryan Hatfield which is his baseline -- Patient is back to his baseline mental status. --BiPAP PRN and nightly --Patient will be discharged home with a BiPAP      COPD with acute exacerbation   Improved --received 8 days of IV solumedrol --cont DuoNeb scheduled and as needed     Community acquired PNA -- Completed ceftriaxone  and azithro (total 5 doses)   Morbid obesity/class III obesity (BMI 41.25kg/m2) OSA --Continue BiPAP on discharge --Lifestyle modification and exercise has been discussed with patient in detail    Acute on chronic diastolic CHF (congestive heart failure)  Patient presenting w/ leg edema, indicating possible CHF exacerbation.   His BNP was 527 which is likely falsely low due to morbid obesity.  Last echo 2024 - EF 60-65%, grade 1 diast df  07/16/2024 Echo EF 60-65 mod LVH indeterminate diastolic appears relatively  euvolemic on exam although volume status is difficult to assess given body habitus  -- Received IV Lasix  following this hospitalization --Continue Lasix  on discharge    Paroxysmal Afib with RVR --switched from Eliquis  to Xarelto  to help with compliance -- Appreciate cardio input -- Will discharge patient on Cardizem  to 240 mg daily and Toprol   extended release 100 mg daily     CAD  - Nonobstructive CAD on cath 01/2023  --cont lipitor    Elevated troponin Likely demand ischemia from rapid A-fib    Essential hypertension -- Continue losartan  and diltiazem     Leukemoid reaction Monitor CBC periodically / as needed   Acquired hypothyroidism Continue Synthroid    Tobacco abuse Nicotine  patch Did counseling about importance of quitting smoking      Hyperglycemia 2/2 steroid use Hemoglobin A1c is 5.8 Will discharge patient on metformin  Maintain consistent carbohydrate diet Check blood sugars daily           Consultants: Cardiology, critical care Procedures performed: Noninvasive mechanical ventilation, 2D echocardiogram Disposition: Home health Diet recommendation:  Discharge Diet Orders (From admission, onward)     Start     Ordered   07/25/24 0000  Diet Carb Modified        07/25/24 1040   07/25/24 0000  Diet - low sodium heart healthy        07/25/24 1040           Cardiac and Carb modified diet DISCHARGE MEDICATION: Allergies as of 07/25/2024       Reactions   Lisinopril  Cough   Paroxetine    REACTION: paranoia, confusion   Sertraline Hcl    REACTION: paranoia, confusion        Medication List     STOP taking these medications    albuterol  108 (90 Base) MCG/ACT inhaler Commonly known as: VENTOLIN  HFA Replaced by: albuterol  (2.5 MG/3ML) 0.083% nebulizer solution   apixaban  5 MG Tabs tablet Commonly known as: ELIQUIS    gabapentin  100 MG capsule Commonly known as: NEURONTIN    isosorbide  mononitrate 30 MG 24 hr tablet Commonly known as: IMDUR    metoprolol  succinate 50 MG 24 hr tablet Commonly known as: TOPROL -XL       TAKE these medications    albuterol  (2.5 MG/3ML) 0.083% nebulizer solution Commonly known as: PROVENTIL  Inhale 3 mLs (2.5 mg total) into the lungs every 4 (four) hours as needed for wheezing or shortness of breath. Replaces: albuterol  108 (90 Base)  MCG/ACT inhaler   albuterol  108 (90 Base) MCG/ACT inhaler Commonly known as: VENTOLIN  HFA Inhale 2 puffs into the lungs every 6 (six) hours as needed for wheezing or shortness of breath.   atorvastatin  40 MG tablet Commonly known as: LIPITOR Take 1 tablet (40 mg total) by mouth daily.   Blood Glucose Monitor System w/Device Kit Use as directed to check blood sugar four times daily.   BLOOD GLUCOSE TEST STRIPS Strp Use as directed to check blood sugar four times daily.   Compressor/Nebulizer Misc 1 ampule by Does not apply route every 6 (six) hours as needed (SOB/Wheezing).   diltiazem  240 MG 24 hr capsule Commonly known as: CARDIZEM  CD Take 1 capsule (240 mg total) by mouth daily. Start taking on: July 26, 2024   fluticasone  furoate-vilanterol 200-25 MCG/ACT Aepb Commonly known as: Breo Ellipta  Inhale 1 puff into the lungs daily.   folic acid  1 MG tablet Commonly known as: FOLVITE  Take 1 tablet (1 mg total) by mouth daily.   furosemide  40 MG tablet Commonly  known as: LASIX  Take 1 tablet (40 mg total) by mouth daily.   Lancet Device Misc 1 each by Does not apply route as directed. Dispense based on patient and insurance preference. Use up to four times daily as directed. (FOR ICD-10 E10.9, E11.9).   Lancets Misc Use as directed to check blood sugar four times daily.   levothyroxine  50 MCG tablet Commonly known as: SYNTHROID  TAKE 1 TABLET BY MOUTH EVERY DAY   losartan  25 MG tablet Commonly known as: COZAAR  Take 1 tablet (25 mg total) by mouth daily. Start taking on: July 26, 2024   metformin  500 MG (OSM) 24 hr tablet Commonly known as: FORTAMET  Take 1 tablet (500 mg total) by mouth daily with breakfast.   metoprolol  tartrate 100 MG tablet Commonly known as: LOPRESSOR  Take 1 tablet (100 mg total) by mouth 2 (two) times daily.   nicotine  14 mg/24hr patch Commonly known as: NICODERM CQ  - dosed in mg/24 hours Place 1 patch (14 mg total) onto the skin  daily. What changed:  when to take this reasons to take this   nitroGLYCERIN  0.4 MG SL tablet Commonly known as: NITROSTAT  Place 1 tablet (0.4 mg total) under the tongue every 5 (five) minutes as needed for chest pain.   omeprazole  20 MG capsule Commonly known as: PRILOSEC TAKE 1 CAPSULE BY MOUTH EVERY DAY   rivaroxaban  20 MG Tabs tablet Commonly known as: XARELTO  Take 1 tablet (20 mg total) by mouth daily with supper.        Follow-up Information     Corwin Antu, FNP Follow up in 1 week(s).   Specialty: Family Medicine Contact information: [address] [place] [address] [phone]         Malka Domino, MD Follow up in 2 week(s).   Specialty: Pulmonary Disease Contact information: [address] [place] [address] [phone]         Darliss Rogue, MD Follow up in 2 week(s).   Specialties: Cardiology, Radiology Contact information: [address] [place] [address] [phone]                Discharge Exam: Ryan Hatfield   07/13/24 1904 07/19/24 0849 07/20/24 2234  Weight: (!) 140 kg 128 kg 126.7 kg    Constitutional: NAD, alert, oriented, morbidly obese HEENT: conjunctivae and lids normal, EOMI CV: No cyanosis.   RESP: Bilateral air entry, no wheezes or rhonchi Extremities: No effusions, edema in BLE SKIN: warm, dry Neuro: II - XII grossly intact.   Psych: Normal mood and affect.     Condition at discharge: stable  The results of significant diagnostics from this hospitalization (including imaging, microbiology, ancillary and laboratory) are listed below for reference.   Imaging Studies: CT Angio Chest Pulmonary Embolism (PE) W or WO Contrast Result Date: 07/17/2024 EXAM: CTA of the Chest with and without contrast for PE 07/17/2024 10:26:40 AM TECHNIQUE: CTA of the chest was performed after the administration of intravenous contrast. Multiplanar reformatted images are provided for  review. MIP images are provided for review. Automated exposure control, iterative reconstruction, and/or weight based adjustment of the mA/kV was utilized to reduce the radiation dose to as low as reasonably achievable. CONTRAST: 75 mL iohexol  (OMNIPAQUE ) 350 MG/ML injection. COMPARISON: CTA chest 01/29/2023. CLINICAL HISTORY: 59 year old male with persistent hypoxia and inconsistent blood thinner use at home. FINDINGS: PULMONARY ARTERIES: Suboptimal but adequate pulmonary artery contrast timing. No convincing pulmonary artery filling defect. Main pulmonary artery is normal  in caliber. Some pulmonary artery detail limited by motion, especially in the Right upper lobe. No convincing pulmonary artery filling defect. MEDIASTINUM: The heart and pericardium demonstrate no acute abnormality. Calcified coronary artery and aortic atherosclerosis. There is no acute abnormality of the thoracic aorta. LYMPH NODES: Mediastinal lymph nodes are stable since 2024, likely reactive. LUNGS AND PLEURA: Mild respiratory motion. Sabre sheath trachea configuration suggesting chronic obstructive pulmonary disease (series 8 image 33). Atelectatic changes to the airways and some retained secretions also suspected in the Right mainstem bronchus. Superimposed generalized central bronchial wall thickening bilaterally. Tree in bud nodular opacity scattered in the Right upper lobe, throughout the Right lower lobe, and superimposed confluent peribronchial opacity in the Lingula, Posterior basal segment of the Right lower lobe, and Medial segment of the Right middle lobe. This more resembles developing consolidation than atelectasis. No pleural effusion. No pneumothorax. UPPER ABDOMEN: Similar mild contrast reflux from the right atrium into the hepatic IVC and hepatic veins. Negative visible other non-contrast upper abdominal viscera. SOFT TISSUES AND BONES: No acute bone or soft tissue abnormality. IMPRESSION: 1. No convincing pulmonary embolus;  evaluation limited by motion and suboptimal contrast timing. 2. Evidence of COPD with superimposed generalized bronchitis, and bilateral respiratory infection with multifocal early consolidation. No pleural effusion. Electronically signed by: Helayne Hurst MD MD 07/17/2024 10:55 AM EST RP Workstation: [id number]   ECHOCARDIOGRAM COMPLETE Result Date: 07/16/2024    ECHOCARDIOGRAM REPORT   Patient Name:   Ryan Hatfield [facility] Date of Exam: 07/16/2024 Medical Rec #:  [id number]         Height:       69.0 in Accession #:    [id number]        Weight:       308.6 lb Date of Birth:  12/17/1965         BSA:          2.484 m Patient Age:    58 years          BP:           105/61 mmHg Patient Gender: M                 HR:           84 bpm. Exam Location:  [facility] Procedure: 2D Echo, Cardiac Doppler, Color Doppler and Intracardiac            Opacification Agent (Both Spectral and Color Flow Doppler were            utilized during procedure). Indications:     CHF I50.31  History:         Patient has prior history of Echocardiogram examinations, most                  recent 05/05/2023.  Sonographer:     Thedora Louder RDCS, FASE Referring Phys:  [id number] LANETA BLUNT Diagnosing Phys: Annabella Scarce MD  Sonographer Comments: Technically difficult study due to poor echo windows, suboptimal apical window and patient is obese. Image acquisition challenging due to patient body habitus and Image acquisition challenging due to respiratory motion. The patient was on 12L of oxygen  at the time of this study. IMPRESSIONS  1. Left ventricular ejection fraction, by estimation, is 60 to 65%. Left ventricular ejection fraction by PLAX is 63 %. The left ventricle has normal function. The left ventricle has no regional wall motion abnormalities. There is moderate concentric left ventricular hypertrophy. Left ventricular diastolic parameters are indeterminate.  2. Right  ventricular systolic function is normal. The right ventricular size is  normal.  3. The mitral valve is normal in structure. No evidence of mitral valve regurgitation. No evidence of mitral stenosis.  4. The aortic valve is tricuspid. Aortic valve regurgitation is not visualized. No aortic stenosis is present.  5. The inferior vena cava is dilated in size with >50% respiratory variability, suggesting right atrial pressure of 8 mmHg. FINDINGS  Left Ventricle: Left ventricular ejection fraction, by estimation, is 60 to 65%. Left ventricular ejection fraction by PLAX is 63 %. The left ventricle has normal function. The left ventricle has no regional wall motion abnormalities. Definity  contrast agent was given IV to delineate the left ventricular endocardial borders. The left ventricular internal cavity size was normal in size. There is moderate concentric left ventricular hypertrophy. Left ventricular diastolic function could not be evaluated due to atrial fibrillation. Left ventricular diastolic parameters are indeterminate. Normal left ventricular filling pressure. Right Ventricle: The right ventricular size is normal. No increase in right ventricular wall thickness. Right ventricular systolic function is normal. Left Atrium: Left atrial size was normal in size. Right Atrium: Right atrial size was normal in size. Pericardium: There is no evidence of pericardial effusion. Mitral Valve: The mitral valve is normal in structure. Mild mitral annular calcification. No evidence of mitral valve regurgitation. No evidence of mitral valve stenosis. Tricuspid Valve: The tricuspid valve is normal in structure. Tricuspid valve regurgitation is not demonstrated. No evidence of tricuspid stenosis. Aortic Valve: The aortic valve is tricuspid. Aortic valve regurgitation is not visualized. No aortic stenosis is present. Aortic valve peak gradient measures 0.9 mmHg. Pulmonic Valve: The pulmonic valve was normal in structure. Pulmonic valve regurgitation is not visualized. No evidence of pulmonic stenosis.  Aorta: The aortic root is normal in size and structure. Venous: The inferior vena cava is dilated in size with greater than 50% respiratory variability, suggesting right atrial pressure of 8 mmHg. IAS/Shunts: No atrial level shunt detected by color flow Doppler.  LEFT VENTRICLE PLAX 2D LV EF:         Left            Diastology                ventricular     LV e' medial:    11.40 cm/s                ejection        LV E/e' medial:  9.4                fraction by     LV e' lateral:   9.36 cm/s                PLAX is 63      LV E/e' lateral: 11.4                %. LVIDd:         4.45 cm LVIDs:         2.95 cm LV PW:         1.40 cm LV IVS:        1.30 cm LVOT diam:     2.40 cm LVOT Area:     4.52 cm  RIGHT VENTRICLE RV Basal diam:  3.00 cm RV S prime:     11.40 cm/s TAPSE (M-mode): 1.8 cm LEFT ATRIUM             Index  RIGHT ATRIUM           Index LA diam:        3.90 cm 1.57 cm/m   RA Area:     19.80 cm LA Vol (A2C):   49.3 ml 19.84 ml/m  RA Volume:   54.30 ml  21.86 ml/m LA Vol (A4C):   34.9 ml 14.05 ml/m LA Biplane Vol: 45.6 ml 18.35 ml/m  AORTIC VALVE             PULMONIC VALVE AV Vmax:      47.10 cm/s PV Vmax:        1.14 m/s AV Peak Grad: 0.9 mmHg   PV Peak grad:   5.2 mmHg                          RVOT Peak grad: 5 mmHg AORTA Ao Root diam: 3.20 cm Ao Asc diam:  3.10 cm MITRAL VALVE MV Area (PHT): 3.30 cm     SHUNTS MV Decel Time: 230 msec     Systemic Diam: 2.40 cm MV E velocity: 107.00 cm/s Annabella Scarce MD Electronically signed by Annabella Scarce MD Signature Date/Time: 07/16/2024/1:12:06 PM    Final    DG Chest Portable 1 View Result Date: 07/13/2024 EXAM: 1 VIEW(S) XRAY OF THE CHEST 02/24/2023 06:54:20 PM COMPARISON: None available. CLINICAL HISTORY: sob FINDINGS: LUNGS AND PLEURA: Bibasilar atelectasis. No focal pulmonary opacity. No pleural effusion. No pneumothorax. HEART AND MEDIASTINUM: Heart is mildly enlarged. BONES AND SOFT TISSUES: No acute osseous abnormality. IMPRESSION: 1.  Mild cardiomegaly. 2. Bibasilar atelectasis. Electronically signed by: Greig Pique MD MD 07/13/2024 06:56 PM EST RP Workstation: [id number]    Microbiology: Results for orders placed or performed during the hospital encounter of 07/13/24  Resp panel by RT-PCR (RSV, Flu A&B, Covid) Anterior Nasal Swab     Status: None   Collection Time: 07/13/24  6:47 PM   Specimen: Anterior Nasal Swab  Result Value Ref Range Status   SARS Coronavirus 2 by RT PCR NEGATIVE NEGATIVE Final    Comment: (NOTE) SARS-CoV-2 target nucleic acids are NOT DETECTED.  The SARS-CoV-2 RNA is generally detectable in upper respiratory specimens during the acute phase of infection. The lowest concentration of SARS-CoV-2 viral copies this assay can detect is 138 copies/mL. A negative result does not preclude SARS-Cov-2 infection and should not be used as the sole basis for treatment or other patient management decisions. A negative result may occur with  improper specimen collection/handling, submission of specimen other than nasopharyngeal swab, presence of viral mutation(s) within the areas targeted by this assay, and inadequate number of viral copies(<138 copies/mL). A negative result must be combined with clinical observations, patient history, and epidemiological information. The expected result is Negative.  Fact Sheet for Patients:  bloggercourse.com  Fact Sheet for Healthcare Providers:  seriousbroker.it  This test is no t yet approved or cleared by the [place]  FDA and  has been authorized for detection and/or diagnosis of SARS-CoV-2 by FDA under an Emergency Use Authorization (EUA). This EUA will remain  in effect (meaning this test can be used) for the duration of the COVID-19 declaration under Section 564(b)(1) of the Act, 21 U.S.C.section 360bbb-3(b)(1), unless the authorization is terminated  or revoked sooner.       Influenza A by PCR  NEGATIVE NEGATIVE Final   Influenza B by PCR NEGATIVE NEGATIVE Final    Comment: (NOTE) The Xpert Xpress SARS-CoV-2/FLU/RSV plus assay is intended as  an aid in the diagnosis of influenza from Nasopharyngeal swab specimens and should not be used as a sole basis for treatment. Nasal washings and aspirates are unacceptable for Xpert Xpress SARS-CoV-2/FLU/RSV testing.  Fact Sheet for Patients: bloggercourse.com  Fact Sheet for Healthcare Providers: seriousbroker.it  This test is not yet approved or cleared by the [place]  FDA and has been authorized for detection and/or diagnosis of SARS-CoV-2 by FDA under an Emergency Use Authorization (EUA). This EUA will remain in effect (meaning this test can be used) for the duration of the COVID-19 declaration under Section 564(b)(1) of the Act, 21 U.S.C. section 360bbb-3(b)(1), unless the authorization is terminated or revoked.     Resp Syncytial Virus by PCR NEGATIVE NEGATIVE Final    Comment: (NOTE) Fact Sheet for Patients: bloggercourse.com  Fact Sheet for Healthcare Providers: seriousbroker.it  This test is not yet approved or cleared by the [place]  FDA and has been authorized for detection and/or diagnosis of SARS-CoV-2 by FDA under an Emergency Use Authorization (EUA). This EUA will remain in effect (meaning this test can be used) for the duration of the COVID-19 declaration under Section 564(b)(1) of the Act, 21 U.S.C. section 360bbb-3(b)(1), unless the authorization is terminated or revoked.  Performed at [facility], [address] [place], [place] [address]   Respiratory (~20 pathogens) panel by PCR     Status: None   Collection Time: 07/13/24 10:12 PM   Specimen: Nasopharyngeal Swab; Respiratory  Result Value Ref Range Status   Adenovirus NOT DETECTED NOT DETECTED Final   Coronavirus 229E NOT DETECTED  NOT DETECTED Final    Comment: (NOTE) The Coronavirus on the Respiratory Panel, DOES NOT test for the novel  Coronavirus (2019 nCoV)    Coronavirus HKU1 NOT DETECTED NOT DETECTED Final   Coronavirus NL63 NOT DETECTED NOT DETECTED Final   Coronavirus OC43 NOT DETECTED NOT DETECTED Final   Metapneumovirus NOT DETECTED NOT DETECTED Final   Rhinovirus / Enterovirus NOT DETECTED NOT DETECTED Final   Influenza A NOT DETECTED NOT DETECTED Final   Influenza B NOT DETECTED NOT DETECTED Final   Parainfluenza Virus 1 NOT DETECTED NOT DETECTED Final   Parainfluenza Virus 2 NOT DETECTED NOT DETECTED Final   Parainfluenza Virus 3 NOT DETECTED NOT DETECTED Final   Parainfluenza Virus 4 NOT DETECTED NOT DETECTED Final   Respiratory Syncytial Virus NOT DETECTED NOT DETECTED Final   Bordetella pertussis NOT DETECTED NOT DETECTED Final   Bordetella Parapertussis NOT DETECTED NOT DETECTED Final   Chlamydophila pneumoniae NOT DETECTED NOT DETECTED Final   Mycoplasma pneumoniae NOT DETECTED NOT DETECTED Final    Comment: Performed at [facility], [address], [place], [place] [address]  Culture, blood (Routine X 2) w Reflex to ID Panel     Status: None   Collection Time: 07/17/24 12:03 PM   Specimen: BLOOD  Result Value Ref Range Status   Specimen Description BLOOD BLOOD RIGHT HAND  Final   Special Requests   Final    AEROBIC BOTTLE ONLY Blood Culture results may not be optimal due to an inadequate volume of blood received in culture bottles   Culture   Final    NO GROWTH 5 DAYS Performed at [facility], [address], [place], [place] [address]    Report Status 07/22/2024 FINAL  Final  Culture, blood (Routine X 2) w Reflex to ID Panel     Status: None   Collection Time: 07/17/24 12:07 PM   Specimen: BLOOD  Result Value Ref Range Status   Specimen Description BLOOD BLOOD LEFT HAND  Final   Special Requests   Final    AEROBIC BOTTLE ONLY Blood Culture results may not be optimal  due to an inadequate volume of blood received in culture bottles   Culture   Final    NO GROWTH 5 DAYS Performed at [facility], [address] [place], [place] [address]    Report Status 07/22/2024 FINAL  Final  MRSA Next Gen by PCR, Nasal     Status: None   Collection Time: 07/19/24  8:52 AM   Specimen: Nasal Mucosa; Nasal Swab  Result Value Ref Range Status   MRSA by PCR Next Gen NOT DETECTED NOT DETECTED Final    Comment: (NOTE) The GeneXpert MRSA Assay (FDA approved for NASAL specimens only), is one component of a comprehensive MRSA colonization surveillance program. It is not intended to diagnose MRSA infection nor to guide or monitor treatment for MRSA infections. Test performance is not FDA approved in patients less than 7 years old. Performed at [facility], [address] [place], [place] [address]     Labs: CBC: Recent Labs  Lab 07/20/24 [phone] 07/23/24 0506 07/24/24 0922  WBC 20.8* 19.8* 25.7*  NEUTROABS  --  13.1*  --   HGB 15.4 15.9 16.9  HCT 49.0 47.7 52.1*  MCV 93.5 88.7 90.3  PLT 311 298 351   Basic Metabolic Panel: Recent Labs  Lab 07/19/24 0416 07/19/24 1625 07/20/24 0334 07/23/24 0506 07/24/24 0922  NA 146* 139 139 138 138  K 5.3* 4.6 4.3 4.0 4.5  CL 90* 91* 95* 100 98  CO2 >45* 38* 37* 31 32  GLUCOSE 154* 302* 183* 119* 122*  BUN 33* 33* 34* 33* 28*  CREATININE 1.06 1.12 1.14 1.03 1.04  CALCIUM  9.2 9.0 8.7* 8.9 9.2  MG  --  2.5* 2.7* 2.6* 2.6*  PHOS  --   --  4.0  --   --    Liver Function Tests: Recent Labs  Lab 07/23/24 0506  AST 16  ALT 35  ALKPHOS 53  BILITOT 0.6  PROT 6.4*  ALBUMIN 3.5   CBG: Recent Labs  Lab 07/24/24 1252 07/24/24 1603 07/24/24 2127 07/24/24 2258 07/25/24 0830  GLUCAP 152* 265* 130* 136* 133*    Discharge time spent: greater than 30 minutes.  Signed: Aimee Somerset, MD [facility] 07/25/2024 "

## 2024-07-25 NOTE — Telephone Encounter (Signed)
 Pharmacy Patient Advocate Encounter  Insurance verification completed.    The patient is insured through [employer].     Ran test claim for Dulera  100-59mcg and the current 30 day co-pay is $4.   This test claim was processed through [facility]- copay amounts may vary at other pharmacies due to [employer], or as the patient moves through the different stages of their insurance plan.

## 2024-07-25 NOTE — Plan of Care (Signed)
  Problem: Education: Goal: Knowledge of disease or condition will improve Outcome: Progressing Goal: Knowledge of the prescribed therapeutic regimen will improve Outcome: Progressing Goal: Individualized Educational Video(s) Outcome: Progressing   Problem: Activity: Goal: Ability to tolerate increased activity will improve Outcome: Progressing Goal: Will verbalize the importance of balancing activity with adequate rest periods Outcome: Progressing   Problem: Respiratory: Goal: Ability to maintain a clear airway will improve Outcome: Progressing Goal: Levels of oxygenation will improve Outcome: Progressing Goal: Ability to maintain adequate ventilation will improve Outcome: Progressing   Problem: Education: Goal: Knowledge of General Education information will improve Description: Including pain rating scale, medication(s)/side effects and non-pharmacologic comfort measures Outcome: Progressing   Problem: Health Behavior/Discharge Planning: Goal: Ability to manage health-related needs will improve Outcome: Progressing   Problem: Clinical Measurements: Goal: Ability to maintain clinical measurements within normal limits will improve Outcome: Progressing Goal: Will remain free from infection Outcome: Progressing Goal: Diagnostic test results will improve Outcome: Progressing Goal: Respiratory complications will improve Outcome: Progressing Goal: Cardiovascular complication will be avoided Outcome: Progressing   Problem: Activity: Goal: Risk for activity intolerance will decrease Outcome: Progressing   Problem: Nutrition: Goal: Adequate nutrition will be maintained Outcome: Progressing   Problem: Coping: Goal: Level of anxiety will decrease Outcome: Progressing   Problem: Elimination: Goal: Will not experience complications related to bowel motility Outcome: Progressing Goal: Will not experience complications related to urinary retention Outcome: Progressing    Problem: Pain Managment: Goal: General experience of comfort will improve and/or be controlled Outcome: Progressing   Problem: Safety: Goal: Ability to remain free from injury will improve Outcome: Progressing   Problem: Skin Integrity: Goal: Risk for impaired skin integrity will decrease Outcome: Progressing   Problem: Education: Goal: Ability to describe self-care measures that may prevent or decrease complications (Diabetes Survival Skills Education) will improve Outcome: Progressing Goal: Individualized Educational Video(s) Outcome: Progressing   Problem: Coping: Goal: Ability to adjust to condition or change in health will improve Outcome: Progressing   Problem: Fluid Volume: Goal: Ability to maintain a balanced intake and output will improve Outcome: Progressing   Problem: Health Behavior/Discharge Planning: Goal: Ability to identify and utilize available resources and services will improve Outcome: Progressing Goal: Ability to manage health-related needs will improve Outcome: Progressing   Problem: Metabolic: Goal: Ability to maintain appropriate glucose levels will improve Outcome: Progressing   Problem: Nutritional: Goal: Maintenance of adequate nutrition will improve Outcome: Progressing Goal: Progress toward achieving an optimal weight will improve Outcome: Progressing   Problem: Skin Integrity: Goal: Risk for impaired skin integrity will decrease Outcome: Progressing   Problem: Tissue Perfusion: Goal: Adequacy of tissue perfusion will improve Outcome: Progressing

## 2024-07-26 ENCOUNTER — Telehealth: Payer: Self-pay

## 2024-07-26 NOTE — Transitions of Care (Post Inpatient/ED Visit) (Signed)
" ° °  07/26/2024  Name: Ryan Hatfield MRN: [id number] DOB: Aug 15, 1965  Today's TOC FU Call Status: Today's TOC FU Call Status:: Unsuccessful Call (1st Attempt) Unsuccessful Call (1st Attempt) Date: 07/26/24  Attempted to reach the patient regarding the most recent Inpatient/ED visit.  Follow Up Plan: Additional outreach attempts will be made to reach the patient to complete the Transitions of Care (Post Inpatient/ED visit) call.   Shona Prow RN, CCM [place]  VBCI-Population Health RN Care Manager [phone]  "

## 2024-07-27 ENCOUNTER — Telehealth: Payer: Self-pay | Admitting: Family

## 2024-07-27 DIAGNOSIS — Z720 Tobacco use: Secondary | ICD-10-CM

## 2024-07-27 DIAGNOSIS — E538 Deficiency of other specified B group vitamins: Secondary | ICD-10-CM

## 2024-07-27 DIAGNOSIS — E782 Mixed hyperlipidemia: Secondary | ICD-10-CM

## 2024-07-27 NOTE — Telephone Encounter (Signed)
 Copied from CRM #[id number]. Topic: Clinical - Medication Refill >> Jul 27, 2024 11:29 AM Emylou G wrote: Medication: nicotine  (NICODERM CQ  - DOSED IN MG/24 HOURS) 14 mg/24hr patch folic acid  (FOLVITE ) 1 MG tablet atorvastatin  (LIPITOR) 40 MG tablet  Has the patient contacted their pharmacy? No (Agent: If no, request that the patient contact the pharmacy for the refill. If patient does not wish to contact the pharmacy document the reason why and proceed with request.) (Agent: If yes, when and what did the pharmacy advise?)  This is the patient's preferred pharmacy:  [facility] [address] [place] - [address] [place] [address] Phone: [phone] Fax: [phone]  Is this the correct pharmacy for this prescription? Yes If no, delete pharmacy and type the correct one.   Has the prescription been filled recently? No  Is the patient out of the medication? Yes  Has the patient been seen for an appointment in the last year OR does the patient have an upcoming appointment? Yes  Can we respond through MyChart? No  Agent: Please be advised that Rx refills may take up to 3 business days. We ask that you follow-up with your pharmacy.

## 2024-07-28 MED ORDER — FOLIC ACID 1 MG PO TABS: 1.0000 mg | ORAL_TABLET | Freq: Every day | ORAL | 0 refills | Status: AC

## 2024-07-28 MED ORDER — ATORVASTATIN CALCIUM 40 MG PO TABS: 40.0000 mg | ORAL_TABLET | Freq: Every day | ORAL | 0 refills | Status: AC

## 2024-07-28 MED ORDER — NICOTINE 14 MG/24HR TD PT24: 14.0000 mg | MEDICATED_PATCH | Freq: Every day | TRANSDERMAL | 2 refills | Status: AC

## 2024-07-28 NOTE — Addendum Note (Signed)
 Addended by: CORWIN ANTU on: 07/28/2024 07:12 AM   Modules accepted: Orders

## 2024-07-28 NOTE — Telephone Encounter (Signed)
 Pt overdue for appt Please have him schedule prior to next refills  I sent in 90 day supply

## 2024-07-29 ENCOUNTER — Telehealth: Payer: Self-pay | Admitting: Family

## 2024-07-29 ENCOUNTER — Encounter: Payer: Self-pay | Admitting: Family

## 2024-07-29 ENCOUNTER — Telehealth: Payer: Self-pay

## 2024-07-29 ENCOUNTER — Ambulatory Visit: Admitting: Family

## 2024-07-29 VITALS — BP 126/72 | HR 64 | Temp 98.0°F | Wt 279.4 lb

## 2024-07-29 DIAGNOSIS — I4891 Unspecified atrial fibrillation: Secondary | ICD-10-CM | POA: Diagnosis not present

## 2024-07-29 DIAGNOSIS — I10 Essential (primary) hypertension: Secondary | ICD-10-CM | POA: Diagnosis not present

## 2024-07-29 DIAGNOSIS — J9622 Acute and chronic respiratory failure with hypercapnia: Secondary | ICD-10-CM

## 2024-07-29 DIAGNOSIS — E559 Vitamin D deficiency, unspecified: Secondary | ICD-10-CM | POA: Diagnosis not present

## 2024-07-29 DIAGNOSIS — E782 Mixed hyperlipidemia: Secondary | ICD-10-CM | POA: Diagnosis not present

## 2024-07-29 DIAGNOSIS — E039 Hypothyroidism, unspecified: Secondary | ICD-10-CM

## 2024-07-29 DIAGNOSIS — J449 Chronic obstructive pulmonary disease, unspecified: Secondary | ICD-10-CM | POA: Diagnosis not present

## 2024-07-29 DIAGNOSIS — E538 Deficiency of other specified B group vitamins: Secondary | ICD-10-CM

## 2024-07-29 DIAGNOSIS — I2510 Atherosclerotic heart disease of native coronary artery without angina pectoris: Secondary | ICD-10-CM | POA: Diagnosis not present

## 2024-07-29 DIAGNOSIS — Z6841 Body Mass Index (BMI) 40.0 and over, adult: Secondary | ICD-10-CM

## 2024-07-29 DIAGNOSIS — R7303 Prediabetes: Secondary | ICD-10-CM | POA: Diagnosis not present

## 2024-07-29 DIAGNOSIS — G4733 Obstructive sleep apnea (adult) (pediatric): Secondary | ICD-10-CM | POA: Insufficient documentation

## 2024-07-29 DIAGNOSIS — F17210 Nicotine dependence, cigarettes, uncomplicated: Secondary | ICD-10-CM

## 2024-07-29 DIAGNOSIS — M1A0710 Idiopathic chronic gout, right ankle and foot, without tophus (tophi): Secondary | ICD-10-CM

## 2024-07-29 DIAGNOSIS — G4731 Primary central sleep apnea: Secondary | ICD-10-CM | POA: Insufficient documentation

## 2024-07-29 DIAGNOSIS — Z720 Tobacco use: Secondary | ICD-10-CM

## 2024-07-29 DIAGNOSIS — I5033 Acute on chronic diastolic (congestive) heart failure: Secondary | ICD-10-CM

## 2024-07-29 LAB — VITAMIN D 25 HYDROXY (VIT D DEFICIENCY, FRACTURES): VITD: 9.39 ng/mL — ABNORMAL LOW (ref 30.00–100.00)

## 2024-07-29 LAB — MICROALBUMIN / CREATININE URINE RATIO
Creatinine,U: 52.8 mg/dL
Microalb Creat Ratio: UNDETERMINED mg/g (ref 0.0–30.0)
Microalb, Ur: <0.7 mg/dL

## 2024-07-29 LAB — LIPID PANEL
Cholesterol: 131 mg/dL (ref 28–200)
HDL: 30 mg/dL — ABNORMAL LOW
LDL Cholesterol: 43 mg/dL (ref 10–99)
NonHDL: 101.48
Total CHOL/HDL Ratio: 4
Triglycerides: 293 mg/dL — ABNORMAL HIGH (ref 10.0–149.0)
VLDL: 58.6 mg/dL — ABNORMAL HIGH (ref 0.0–40.0)

## 2024-07-29 LAB — B12 AND FOLATE PANEL
Folate: 18 ng/mL
Vitamin B-12: 420 pg/mL (ref 211–911)

## 2024-07-29 LAB — URIC ACID: Uric Acid, Serum: 6.7 mg/dL (ref 4.0–7.8)

## 2024-07-29 LAB — T4, FREE: Free T4: 1.05 ng/dL (ref 0.60–1.60)

## 2024-07-29 MED ORDER — ZEPBOUND 2.5 MG/0.5ML ~~LOC~~ SOAJ: 2.5000 mg | SUBCUTANEOUS | 0 refills | Status: DC

## 2024-07-29 NOTE — Progress Notes (Unsigned)
 "  Established Patient Office Visit  Subjective:      CC:  Chief Complaint  Patient presents with   Hospitalization Follow-up    [facility] 1/7-1/19    HPI: Ryan Hatfield is a 59 y.o. male presenting on 07/29/2024 for Hospitalization Follow-up [facility] 1/7-1/19) .  Discussed the use of AI scribe software for clinical note transcription with the patient, who gave verbal consent to proceed.  History of Present Illness Ryan Hatfield is a 59 year old male with COPD and CHF who presents for follow-up after recent hospitalization for acute respiratory failure.  He was hospitalized from January 9th to January 19th for acute on chronic respiratory failure with hypoxia and hypercarbic respiratory failure. He experienced shortness of breath and a progressively worsening cough with mucus over two days prior to admission. His oxygen  saturation dropped to 76%, requiring an increase from his baseline of 2 liters to 6-8 liters of oxygen , and eventually BiPAP support, which improved his condition. He was discharged on 2 liters of oxygen  and continues to use BiPAP at night.  During his hospital stay, he was treated for pneumonia with ceftriaxone  and azithromycin , completing a total of five doses. He also received eight days of IV Solu-Medrol  and continues to use DuoNeb as scheduled and as needed. His BNP was 527, and he was treated with IV Lasix  to manage fluid overload. He was discharged with a prescription for Lasix .  He has a history of chronic diastolic heart failure and atrial fibrillation. His medications were adjusted during his hospital stay, switching from Eliquis  to Xarelto , and he was discharged on Cardizem  240 mg once daily and Toprol  100 mg daily. He also has nonobstructive coronary artery disease, with a previous cardiac catheterization on January 24, 2023, showing elevated troponin levels likely due to demand ischemia from rapid AFib.  He experiences tingling and numbness, feels  generally unwell, and has been placed on a heart-healthy diet. He was also started on metformin  for mildly elevated A1c in the prediabetic range. He is using a nicotine  patch, which he refilled two days ago but has not yet picked up.  His last echocardiogram in 2024 showed an ejection fraction of 60-65% with grade one diastolic dysfunction. A repeat echocardiogram during his recent hospitalization showed left ventricular hypertrophy and indeterminate diastolic function.         Social history:  Relevant past medical, surgical, family and social history reviewed and updated as indicated. Interim medical history since our last visit reviewed.  Allergies and medications reviewed and updated.  DATA REVIEWED: CHART IN EPIC     ROS: Negative unless specifically indicated above in HPI.   Current Medications[1]        Objective:        BP 126/72 (BP Location: Right Arm, Patient Position: Sitting, Cuff Size: Large)   Pulse 64   Temp 98 F (36.7 C) (Temporal)   Wt 279 lb 6.4 oz (126.7 kg)   SpO2 96% Comment: 2L pulse  BMI 41.26 kg/m   Physical Exam VITALS: P- 64, BP- 126/72, SaO2- 96%  Wt Readings from Last 3 Encounters:  07/29/24 279 lb 6.4 oz (126.7 kg)  07/20/24 279 lb 5.2 oz (126.7 kg)  06/22/24 (!) 303 lb 12.8 oz (137.8 kg)    Physical Exam Constitutional:      General: He is not in acute distress.    Appearance: Normal appearance. He is normal weight. He is not ill-appearing, toxic-appearing or diaphoretic.  Cardiovascular:  Rate and Rhythm: Normal rate and regular rhythm.  Pulmonary:     Effort: Pulmonary effort is normal.     Breath sounds: Normal breath sounds.     Comments: On o2 2 L Musculoskeletal:        General: Normal range of motion.     Right lower leg: 1+ Edema present.     Left lower leg: 1+ Edema present.  Neurological:     General: No focal deficit present.     Mental Status: He is alert and oriented to person, place, and time. Mental  status is at baseline.  Psychiatric:        Mood and Affect: Mood normal.        Behavior: Behavior normal.        Thought Content: Thought content normal.        Judgment: Judgment normal.        Results Labs BNP (07/13/2024): 527 Creatinine (07/13/2024): Greater than 60 Troponin (07/13/2024): 20 and 21 COVID PCR (07/13/2024): Negative Influenza PCR (07/13/2024): Negative RSV PCR (07/13/2024): Negative CBC (06/22/2024): Anemia Hemoglobin A1c (06/22/2024): Mildly elevated, prediabetic range  Radiology Chest X-ray (07/13/2024): Cardiomegaly and bibasilar atelectasis  Diagnostic EKG (07/13/2024): Sinus rhythm with QTc 449 ms Echocardiogram (07/2024): Left ventricular hypertrophy, indeterminate diastolic function Echocardiogram (2024): Ejection fraction 60-65%, grade 1 diastolic dysfunction Cardiac catheterization (01/2023): Nonobstructive coronary artery disease  Assessment & Plan:   Assessment and Plan Assessment & Plan Acute on chronic respiratory failure with hypoxia and hypercapnia Recent hospitalization for acute on chronic respiratory failure with hypoxia and hypercapnia, likely exacerbated by COPD and CHF. Improved with noninvasive mechanical ventilation (BiPAP) and is now on 2 liters of oxygen  with 96% saturation. - Continue BiPAP as needed, especially at night. - Ensure continuous use of oxygen  therapy.  Chronic obstructive pulmonary disease (COPD) COPD with recent exacerbation leading to hospitalization. Received 8 days of IV Solu-Medrol  and completed ceftriaxone  and azithromycin  for pneumonia. Currently on DuoNeb scheduled and as needed. - Continue DuoNeb as scheduled and as needed.  Pneumonia Recent pneumonia treated with ceftriaxone  and azithromycin . Completed 5 doses of antibiotics. - Ensure follow-up with pulmonologist.  Acute on chronic diastolic heart failure Recent exacerbation likely due to fluid overload. BNP was elevated at 527. Received IV Lasix  and  discharged on Lasix . Echocardiogram showed left ventricular hypertrophy and indeterminate diastolic function. - Continue Lasix  as prescribed. - Ensure follow-up with cardiologist.  Atrial fibrillation Recent episode likely contributing to elevated troponin and demand ischemia. Switched from Eliquis  to Xarelto . Discharged on Cardizem  and Toprol . - Continue Xarelto , Cardizem , and Toprol  as prescribed. - Ensure follow-up with cardiologist.  Coronary artery disease, nonobstructive Nonobstructive coronary artery disease with recent elevated troponin likely due to demand ischemia from rapid AFib. On nicotine  patch for smoking cessation. - Continue nicotine  patch for smoking cessation. - Ensure follow-up with cardiologist.  Anemia Noted during recent hospitalization.  Prediabetes Mildly elevated A1c indicating prediabetes. On metformin . - Continue metformin  as prescribed. - Focus on heart-healthy diet, possibly Mediterranean diet.  Nicotine  dependence, cigarettes Nicotine  dependence with recent initiation of nicotine  patch. - Continue nicotine  patch for smoking cessation.        Return in about 6 months (around 01/26/2025) for f/u CPE.     Ginger Patrick, MSN, APRN, FNP-C [facility]        [1]  Current Outpatient Medications:    ACCU-CHEK GUIDE TEST test strip, , Disp: , Rfl:    albuterol  (PROVENTIL ) (2.5 MG/3ML) 0.083% nebulizer solution,  Inhale 3 mLs (2.5 mg total) into the lungs every 4 (four) hours as needed for wheezing or shortness of breath., Disp: 75 mL, Rfl: 12   albuterol  (VENTOLIN  HFA) 108 (90 Base) MCG/ACT inhaler, Inhale 2 puffs into the lungs every 6 (six) hours as needed for wheezing or shortness of breath., Disp: 6.7 g, Rfl: 2   atorvastatin  (LIPITOR) 40 MG tablet, Take 1 tablet (40 mg total) by mouth daily., Disp: 90 tablet, Rfl: 0   Blood Glucose Monitoring Suppl (BLOOD GLUCOSE MONITOR SYSTEM) w/Device KIT, Use as directed to check  blood sugar four times daily., Disp: 1 kit, Rfl: 0   diltiazem  (CARDIZEM  CD) 240 MG 24 hr capsule, Take 1 capsule (240 mg total) by mouth daily., Disp: 30 capsule, Rfl: 1   folic acid  (FOLVITE ) 1 MG tablet, Take 1 tablet (1 mg total) by mouth daily., Disp: 90 tablet, Rfl: 0   furosemide  (LASIX ) 40 MG tablet, Take 1 tablet (40 mg total) by mouth daily., Disp: 60 tablet, Rfl: 2   Glucose Blood (BLOOD GLUCOSE TEST STRIPS) STRP, Use as directed to check blood sugar four times daily., Disp: 100 strip, Rfl: 0   Lancet Device MISC, 1 each by Does not apply route as directed. Dispense based on patient and insurance preference. Use up to four times daily as directed. (FOR ICD-10 E10.9, E11.9)., Disp: 1 each, Rfl: 0   Lancets MISC, Use as directed to check blood sugar four times daily., Disp: 100 each, Rfl: 0   levothyroxine  (SYNTHROID ) 50 MCG tablet, TAKE 1 TABLET BY MOUTH EVERY DAY, Disp: 90 tablet, Rfl: 0   losartan  (COZAAR ) 25 MG tablet, Take 1 tablet (25 mg total) by mouth daily., Disp: 30 tablet, Rfl: 1   metFORMIN  (GLUCOPHAGE -XR) 500 MG 24 hr tablet, Take 1 tablet (500 mg total) by mouth daily with breakfast., Disp: 30 tablet, Rfl: 0   metoprolol  tartrate (LOPRESSOR ) 100 MG tablet, Take 1 tablet (100 mg total) by mouth 2 (two) times daily., Disp: 60 tablet, Rfl: 0   mometasone -formoterol  (DULERA ) 200-5 MCG/ACT AERO, Inhale 1 puff into the lungs 2 (two) times daily., Disp: 13 g, Rfl: 3   Nebulizers (COMPRESSOR/NEBULIZER) MISC, 1 ampule by Does not apply route every 6 (six) hours as needed (SOB/Wheezing)., Disp: 1 each, Rfl: 0   nicotine  (NICODERM CQ  - DOSED IN MG/24 HOURS) 14 mg/24hr patch, Place 1 patch (14 mg total) onto the skin daily., Disp: 28 patch, Rfl: 2   nitroGLYCERIN  (NITROSTAT ) 0.4 MG SL tablet, Place 1 tablet (0.4 mg total) under the tongue every 5 (five) minutes as needed for chest pain., Disp: 90 tablet, Rfl: 3   omeprazole  (PRILOSEC) 20 MG capsule, TAKE 1 CAPSULE BY MOUTH EVERY DAY, Disp:  90 capsule, Rfl: 1   rivaroxaban  (XARELTO ) 20 MG TABS tablet, Take 1 tablet (20 mg total) by mouth daily with supper., Disp: 30 tablet, Rfl: 0   tirzepatide  (ZEPBOUND ) 2.5 MG/0.5ML Pen, Inject 2.5 mg into the skin once a week., Disp: 2 mL, Rfl: 0  "

## 2024-07-29 NOTE — Transitions of Care (Post Inpatient/ED Visit) (Signed)
" ° °  07/29/2024  Name: Ryan Hatfield MRN: [id number] DOB: [birth year]  Today's TOC FU Call Status: Today's TOC FU Call Status:: Successful TOC FU Call Completed TOC FU Call Complete Date: 07/29/24  Patient's Name and Date of Birth confirmed. Name, DOB  Transition Care Management Follow-up Telephone Call Date of Discharge: 07/25/24 Discharge Facility: [facility]) Type of Discharge: Inpatient Admission Primary Inpatient Discharge Diagnosis:: Acute on chronic respiratory failure with hypoxia How have you been since you were released from the hospital?: Better Any questions or concerns?: No  Items Reviewed: Did you receive and understand the discharge instructions provided?: Yes Medications obtained,verified, and reconciled?: Yes (Medications Reviewed)  Medications Reviewed Today: verbally states that he has all his medications.  Medications Reviewed Today   Medications were not reviewed in this encounter     Home Care and Equipment/Supplies:    Functional Questionnaire:    Follow up appointments reviewed: PCP Follow-up appointment confirmed?: Yes Date of PCP follow-up appointment?: 07/29/24 Follow-up Provider: PCP  Placed call to patient and explained reason for call.  Patient reports that he saw his PCP today and reviewed all instructions. Denies wanting to complete call.  Encouraged patient to call PCP if he has any questions or concerns. - He agreed.   Alan Ee, RN, BSN, CEN Population Health- Transition of Care Team.  [facility] [phone]  "

## 2024-07-29 NOTE — Telephone Encounter (Signed)
 Can we start prior auth for zepbound  Pt bmi > 40 and severe OSA

## 2024-07-30 ENCOUNTER — Ambulatory Visit: Payer: Self-pay | Admitting: Family

## 2024-07-30 DIAGNOSIS — E559 Vitamin D deficiency, unspecified: Secondary | ICD-10-CM

## 2024-07-30 LAB — TSH: TSH: 4.28 m[IU]/L (ref 0.40–4.50)

## 2024-07-30 MED ORDER — CHOLECALCIFEROL 1.25 MG (50000 UT) PO TABS: 1.0000 | ORAL_TABLET | ORAL | 0 refills | Status: DC

## 2024-08-02 NOTE — Progress Notes (Unsigned)
 "  Cardiology Clinic Note   Date: 08/04/2024 ID: Trimaine E Lacks, DOB Feb 08, 1966, MRN [id number]  Primary Cardiologist:  Redell Cave, MD  Chief Complaint   Ryan Hatfield is a 59 y.o. male who presents to the clinic today for hospital follow up.   Patient Profile   Ryan Hatfield is followed by Dr. Cave for the history outlined below.       Past medical history significant for: Nonobstructive CAD. R/LHC 02/02/2023: Proximal to mid RCA 40%.  Mid LAD 20%. Chronic HFpEF. R/LHC 02/02/2023: Moderate elevation of right heart pressures with moderate pulmonary hypertension and mean PA pressure 39 mmHg. Echo 07/16/2024: EF 60 to 65%.  No RWMA.  Moderate concentric LVH.  Indeterminate diastolic parameters.  Normal RV size/function.  No significant valvular abnormalities. A-flutter. TEE cardioversion 02/04/2023. Hypertension. Hyperlipidemia. LPa 02/03/2023: 13.9. Lipid panel 07/29/2024: LDL 43, HDL 30, TG 293, total 131. OSA. COPD. Gout. Tobacco abuse.  In summary, patient was first evaluated by Dr. Cave on 02/17/2022 for chest pain.  He reported a several month history of chest pain and shortness of breath with exertion.  Echo and CTA were ordered.  CTA was not completed.  Echo demonstrated EF 60 to 65%, no RWMA, mild LVH, LVOT gradient with Valsalva 29 mmHg, Grade I DD, normal RV size/function, mild LAE.  Patient presented to the ED on 01/29/2023 with worsening lower extremity edema bilaterally despite daily Lasix  with extra doses.  He reported increased dyspnea with associated weight gain, PND and orthopnea.  He also reported daily chest pain with or without exertion with associated nausea and diaphoresis.  EKG demonstrated a flutter with RVR of 156 bpm.  He was hypoxic with SpO2 87% on room air.  He was started on oxygen  and Cardizem  drip.  BNP was mildly elevated at 123.  Troponin 20>> 19.  Creatinine 1.13, potassium 4.1, hemoglobin 16.  Chest x-ray revealed mild  vascular congestion and small pleural effusion.  Echo demonstrated EF 55 to 60%, indeterminate diastolic parameters, normal RV systolic function, aortic valve sclerosis/calcification without stenosis.  He underwent R/LHC which showed nonobstructive CAD and moderate elevation of right heart pressures with moderate pulmonary hypertension.  He underwent TEE cardioversion.  He was discharged on 02/06/2023.    Upon follow-up in August and October 2024 patient continued to complain of shortness of breath.  He was maintaining sinus rhythm and adherent to Eliquis .  He will follow-up continue supplemental O2.  He was evaluated by Dr. Cindie on 04/15/2023.  It was felt management of a-flutter was significantly complicated by profound obesity, COPD requiring O2 and ongoing tobacco abuse.  He was felt to be a poor candidate for catheter ablation.  Rhythm control was indicated.  Patient was seen in the office on 05/28/2023.  He continued to report chronic shortness of breath and was told by pulmonologist that he would need supplemental oxygen  for the rest of his life.  He was euvolemic at the time of his visit.   Patient was last seen in the office by me on 02/17/2024 for routine follow-up.  He reported stable chronic dyspnea with use of supplemental O2.  He is supposed to be on continuous O2 supplementation but did not have access to a portable tank.  He reported a long history of left-sided chest pain occurring with and without exertion lasting seconds to minutes unchanged from previous.  He also described burning epigastric pain occurring at night and sometimes waking him from sleep resolved with Tums.  He had  not been taking Eliquis  or Toprol  for about 2 months secondary to insurance issues.  He was restarted on Eliquis  and Toprol  2 weeks prior.  He was pending repeat sleep study for CPAP titration.  Isosorbide  was increased at the time of his visit.  Patient presented to the ED via EMS on 07/13/2024 for 2-day history of  shortness of breath.  Upon arrival of EMS SpO2 was 76% on 2 L per [place].  He was placed on 8 L via NRB and SpO2 increased to 95%.  He was in sinus rhythm upon arrival to ED.  He reported his brother had been sick earlier in the week.  CBC and BMP were unremarkable.  proBNP minimally elevated at 527.  Troponin minimally elevated and flat 20 >> 21.  Chest x-ray demonstrated cardiomegaly and bibasilar atelectasis.  He was admitted for COPD and CHF exacerbation.  He was placed on HFNC at 15 L.  He was switched to BiPAP but could not tolerate it was placed back on HFNC.  On 1/9 he was noted to be in A-fib with RVR and started on diltiazem  bolus/infusion.  Heart rate remained elevated and he was started on amiodarone .  Cardiology was consulted.  Decision was made to stop amiodarone  secondary to patient's noncompliance with anticoagulation often taking Eliquis  only once a day.  Isosorbide  was discontinued to allow for room for rate control.  Heart rate was controlled with diltiazem  and metoprolol .  He was transitioned from Eliquis  to Xarelto  given his compliance concerns.  He was a poor candidate for TEE due to neck size/airway.  Plan for 3 weeks of uninterrupted anticoagulation and outpatient cardioversion.  Patient was discharged on 07/25/2024.     History of Present Illness    Today, patient is accompanied by his daughter. He reports feeling slightly improved over the last couple of days with a little more energy than prior to hospital admission. He reports chronic chest pain and dyspnea are at baseline. He is using his BiPAP with good tolerance. He has a long history of poor sleep only getting a couple of hours at a time. He reports mild lower extremity edema. He is not weighing at home. His daughter inquires about him taking an extra dose of Lasix  as needed. He denies palpitations. He states typically when he is in afib he has fatigue and a nervous feeling. He reports compliance with Xarelto . No blood in stool or  urine.     ROS: All other systems reviewed and are otherwise negative except as noted in History of Present Illness.  EKGs/Labs Reviewed    EKG Interpretation Date/Time:  Thursday August 04 2024 10:15:37 EST Ventricular Rate:  62 PR Interval:  172 QRS Duration:  100 QT Interval:  410 QTC Calculation: 416 R Axis:   74  Text Interpretation: Normal sinus rhythm Normal ECG When compared with ECG of 22-Jul-2024 06:12, Sinus rhythm has replaced Atrial flutter Vent. rate has decreased BY  64 BPM Questionable change in QRS duration Criteria for Anterior infarct are no longer Present Criteria for Anterolateral infarct are no longer Present Criteria for Inferior infarct are no longer Present Confirmed by Loistine Sober [phone]) on 08/04/2024 10:19:09 AM   07/23/2024: ALT 35; AST 16 07/24/2024: BUN 28; Creatinine, Ser 1.04; Potassium 4.5; Sodium 138   07/24/2024: Hemoglobin 16.9; WBC 25.7   07/29/2024: TSH 4.28   07/13/2024: Pro Brain Natriuretic Peptide 527.0    Risk Assessment/Calculations     CHA2DS2-VASc Score = 3   This indicates a  3.2% annual risk of stroke. The patient's score is based upon: CHF History: 1 HTN History: 1 Diabetes History: 0 Stroke History: 0 Vascular Disease History: 1 Age Score: 0 Gender Score: 0             Physical Exam    VS:  BP (!) 110/58 (BP Location: Left Arm, Patient Position: Sitting, Cuff Size: Large)   Pulse 62   Ht 5' 9 (1.753 m)   Wt 286 lb 3.2 oz (129.8 kg)   SpO2 94%   BMI 42.26 kg/m  , BMI Body mass index is 42.26 kg/m.  GEN: Well nourished, well developed, in no acute distress. Neck: No JVD or carotid bruits. Cardiac:  RRR.  No murmur. No rubs or gallops.   Respiratory:  Respirations regular and unlabored. Diminished breath sounds bilateral bases without rales, wheezing or rhonchi. GI: Soft, nontender, nondistended. Extremities: Radials/DP/PT 2+ and equal bilaterally. No clubbing or cyanosis. 1+ pitting edema bilateral lower  extremities.   Skin: Warm and dry, no rash. Neuro: Strength intact.  Assessment & Plan   Nonobstructive CAD [facility] July 2024 showed proximal to mid RCA 40%, mid LAD 20%.  Patient reports years long history of chronic chest pain since I was a child. He states chest pain was worse when he went into the hospital. He reports it is now back to baseline. EKG without acute changes.  - Continue atorvastatin , metoprolol , as needed SL NTG.  Not on aspirin  secondary to Xarelto .   Chronic HFpEF R/LHC: 2024 showed moderate elevation of right heart pressures with moderate pulmonary hypertension mean PA pressure 39 mmHg.  Echo January 2026 showed normal LV/RV function.  Patient reports dyspnea is back to baseline. He gets occasional lower extremity edema. He has been using BiPAP with good tolerance. He has not been weighing at home but feels his weight may be up since hospital discharge. He reports brisk diuresis on current dose of Lasix . 1+ pitting edema bilateral lower extremities. Diminished breath sounds bilateral bases without wheezing, rhonchi or rales.  - Weigh daily. - Continue metoprolol , Lasix . May take an extra dose of Lasix  20 mg for weight gain or increased edema.    A-flutter S/p TEE cardioversion July 2024.  Recent A-fib with RVR during hospital admission in the setting of COPD and CHF exacerbation.  Rates controlled with diltiazem  and metoprolol .  Transitioned from Eliquis  to Xarelto  secondary to compliance concerns.  Denies spontaneous bleeding concerns.  He reports compliance with Xarelto . EKG demonstrates NSR.  - Continue diltiazem , metoprolol , Xarelto . - CBC and BMP today. - Refer to EP.    Hypertension BP today 110/58. No report of headaches or dizziness.  - Continue diltiazem , metoprolol .   Hyperlipidemia LDL 43 in January 2026, at goal. - Continue atorvastatin .   OSA Patient diagnosed with severe OSA.  He reports using BiPAP with good tolerance. He has a long history of poor  sleep. He states he will keep the BiPAP on when he is laying in bed even if he is not sleeping.  - Encouraged continued use of BiPAP.    Tobacco abuse Patient has not smoked for 4 weeks. He is utilizing a nicotine  patch. He is congratulated on his efforts.  - Encouraged continued cessation.   Disposition: CBC and BMP today. Refer to EP. Return in 3 months or sooner as needed.          Signed, Barnie HERO. Mikaya Bunner, DNP, NP-C  "

## 2024-08-03 ENCOUNTER — Encounter: Payer: Self-pay | Admitting: Pulmonary Disease

## 2024-08-03 ENCOUNTER — Ambulatory Visit (INDEPENDENT_AMBULATORY_CARE_PROVIDER_SITE_OTHER): Admitting: Pulmonary Disease

## 2024-08-03 VITALS — BP 120/70 | HR 70 | Temp 98.1°F | Ht 69.0 in | Wt 284.4 lb

## 2024-08-03 DIAGNOSIS — I2722 Pulmonary hypertension due to left heart disease: Secondary | ICD-10-CM | POA: Diagnosis not present

## 2024-08-03 DIAGNOSIS — I480 Paroxysmal atrial fibrillation: Secondary | ICD-10-CM

## 2024-08-03 DIAGNOSIS — I5030 Unspecified diastolic (congestive) heart failure: Secondary | ICD-10-CM

## 2024-08-03 DIAGNOSIS — E669 Obesity, unspecified: Secondary | ICD-10-CM | POA: Diagnosis not present

## 2024-08-03 DIAGNOSIS — F17210 Nicotine dependence, cigarettes, uncomplicated: Secondary | ICD-10-CM | POA: Diagnosis not present

## 2024-08-03 DIAGNOSIS — I2723 Pulmonary hypertension due to lung diseases and hypoxia: Secondary | ICD-10-CM

## 2024-08-03 DIAGNOSIS — Z6841 Body Mass Index (BMI) 40.0 and over, adult: Secondary | ICD-10-CM | POA: Diagnosis not present

## 2024-08-03 DIAGNOSIS — G4733 Obstructive sleep apnea (adult) (pediatric): Secondary | ICD-10-CM | POA: Diagnosis not present

## 2024-08-03 DIAGNOSIS — J4489 Other specified chronic obstructive pulmonary disease: Secondary | ICD-10-CM | POA: Diagnosis not present

## 2024-08-03 NOTE — Progress Notes (Unsigned)
 "  Synopsis: Referred in by Corwin Antu, FNP   Subjective:   PATIENT ID: Ryan Hatfield GENDER: male DOB: 1966-01-02, MRN: [id number]  Chief Complaint  Patient presents with   Hospitalization Follow-up    In the hospital on 07/13/2024. SOB. Wheezing. Cough with clear sputum. Swelling in his legs.  Dulera - BID, does not know if it helps. Albuterol  inhaler- PRN. Albuterol  Neb- PRN    HPI [name] is a 59 year old male patient with a past medical history of paroxysmal A-fib on Eliquis , hypothyroidism on Synthroid  tobacco use disorder, presumed COPD presenting to the pulmonary clinic today for ongoing shortness of breath.  He was recently admitted to [facility] from 07/25-08/01 with shortness of breath lower extremity edema fatigue and lethargy.  He was found to be in CHF exacerbation with BNP 123 chest x-ray with pulmonary vascular congestion and right pleural effusion. Echocardiogram with EF 40 to 45% Diastology could not be assessed. CTA chest with right pleural effusion, increased contrast in hepatic veins.  He was diuresed aggressively and lost 30 pounds of fluids.  He felt well and was discharged on 2 L nasal cannula.  Improved significant on Trelegy ellipta  but remains with significant dizziness lightheadedness on exertion.   PFTs 07/2023 - Sprio with reduced FEV-1 47% of predicted, reduced FVC at 47%of predicted normal ratio and significant response to bronchodilators. Reduced PFR Lung volumes suggestive of air trapping. Mildly reduced DLCO.   11/02/2023 Feels overall better today with improved dyspnea. Contiues with Trelegy Ellipta .   Family history -he denies any family history of pulmonary diseases  Social history -smokes 5 cigarettes a day started at 59 years old.  Denies any alcohol or illicit drug use.  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  OV 06/22/2024 - [name] is here to follow up on his respiratory failure. He is not taking Trelegy as it was  not covered by his insurance. I explained to him that he needs to be on a daily inhaler as his PFTs  were suggestive of COPD with asthma overlap. He is agreeable and therefore will start him on Breo. I will also have him see Dr. Jess for severe OSA/CSA. Unfortunately he continues to smoke but is willing to try quitting.   ROS All systems were reviewed and are negative except for the above.  Objective:   Vitals:   08/03/24 1157  BP: 120/70  Pulse: 70  Temp: 98.1 F (36.7 C)  SpO2: 95%  Weight: 284 lb 6.4 oz (129 kg)  Height: 5' 9 (1.753 m)     95% on RA BMI Readings from Last 3 Encounters:  08/03/24 42.00 kg/m  07/29/24 41.26 kg/m  07/20/24 41.25 kg/m   Wt Readings from Last 3 Encounters:  08/03/24 284 lb 6.4 oz (129 kg)  07/29/24 279 lb 6.4 oz (126.7 kg)  07/20/24 279 lb 5.2 oz (126.7 kg)    Physical Exam GEN: NAD, Morbidly obese HEENT: Supple Neck, Reactive Pupils, EOMI  CVS: Normal S1, Normal S2, RRR, No murmurs or ES appreciated  Lungs: Bibasilar crackles noted Abdomen: Soft, non tender, non distended, + BS  Extremities: Warm and well perfused, No edema   Labs and imaging were reviewed.   Ancillary Information   CBC    Component Value Date/Time   WBC 25.7 (H) 07/24/2024 0922   RBC 5.77 07/24/2024 0922   HGB 16.9 07/24/2024 0922   HGB 15.4 02/17/2024 0857   HCT 52.1 (H) 07/24/2024 0922   HCT 48.8 02/17/2024  0857   PLT 351 07/24/2024 0922   PLT 236 02/17/2024 0857   MCV 90.3 07/24/2024 0922   MCV 94 02/17/2024 0857   MCH 29.3 07/24/2024 0922   MCHC 32.4 07/24/2024 0922   RDW 12.5 07/24/2024 0922   RDW 12.9 02/17/2024 0857   LYMPHSABS 4.7 (H) 07/23/2024 0506   MONOABS 1.2 (H) 07/23/2024 0506   EOSABS 0.1 07/23/2024 0506   BASOSABS 0.1 07/23/2024 0506    Imaging  CXR 07/25: *Mild pulmonary vascular congestion. Probable small right pleural effusion with associated compressive atelectasis.   CTA Chest 01/29/2023:  No pneumothorax is noted.  Small right pleural effusion is noted with adjacent atelectasis of the right lower lobe. Left lung is unremarkable.  TEE 02/04/2023:   1. Limited study, transgastric views not obtained as patient developed  hypoxia and procedure was aborted   2. Left ventricular ejection fraction, by estimation, is 40 to 45%. The  left ventricle has mildly decreased function.   3. Right ventricular systolic function is mildly reduced. The right  ventricular size is normal.   4. Left atrial size was mildly dilated. No left atrial/left atrial  appendage thrombus was detected.   5. Right atrial size was mildly dilated.   6. The mitral valve is normal in structure. Moderate mitral valve  regurgitation.   7. The aortic valve is tricuspid. Aortic valve regurgitation is trivial.       Latest Ref Rng & Units 07/21/2023    9:48 AM  PFT Results  FVC-Pre L 2.19   FVC-Predicted Pre % 47   FVC-Post L 2.43   FVC-Predicted Post % 52   Pre FEV1/FVC % % 76   Post FEV1/FCV % % 72   FEV1-Pre L 1.65   FEV1-Predicted Pre % 47   FEV1-Post L 1.74   DLCO uncorrected ml/min/mmHg 19.98   DLCO UNC% % 74   DLVA Predicted % 88   TLC L 5.90   TLC % Predicted % 86   RV % Predicted % 148      Assessment & Plan:  [name] is a 59 year old male patient with a past medical history of paroxysmal A-fib on Eliquis , hypothyroidism on Synthroid  tobacco use disorder, presumed COPD presenting to the pulmonary clinic today for ongoing shortness of breath.   #COPD/Asthma Overlap   #HFpEF dry weigh 260lbs  #Paroxysmal A.fib on eliquis   #Severe OSA AHI 42.5 with CSA and severe desaturation.  #Tobacco use disorder   6 min walk test 185 meters significantly reduced.   []  Start Breo Ellipta  200 1 puff daily.  []  Albuterol  Inh 2 puffs Q6H as needed  []  Enroll in LDCT scan program  []  Titration sleep study and follow up with Dr. Jess.  RTC 6 months.   I personally spent a total of 30 minutes in the care of the patient today  including preparing to see the patient, getting/reviewing separately obtained history, performing a medically appropriate exam/evaluation, counseling and educating, placing orders, documenting clinical information in the EHR, independently interpreting results, and communicating results.   Darrin Barn, MD [facility] 08/03/2024 11:59 AM    "

## 2024-08-04 ENCOUNTER — Encounter: Payer: Self-pay | Admitting: Student

## 2024-08-04 ENCOUNTER — Ambulatory Visit: Admitting: Student

## 2024-08-04 VITALS — BP 110/58 | HR 62 | Ht 69.0 in | Wt 286.2 lb

## 2024-08-04 DIAGNOSIS — Z79899 Other long term (current) drug therapy: Secondary | ICD-10-CM | POA: Insufficient documentation

## 2024-08-04 DIAGNOSIS — F17201 Nicotine dependence, unspecified, in remission: Secondary | ICD-10-CM | POA: Diagnosis present

## 2024-08-04 DIAGNOSIS — I2510 Atherosclerotic heart disease of native coronary artery without angina pectoris: Secondary | ICD-10-CM | POA: Insufficient documentation

## 2024-08-04 DIAGNOSIS — I4892 Unspecified atrial flutter: Secondary | ICD-10-CM | POA: Insufficient documentation

## 2024-08-04 DIAGNOSIS — I480 Paroxysmal atrial fibrillation: Secondary | ICD-10-CM | POA: Diagnosis present

## 2024-08-04 DIAGNOSIS — I10 Essential (primary) hypertension: Secondary | ICD-10-CM | POA: Diagnosis present

## 2024-08-04 DIAGNOSIS — G4733 Obstructive sleep apnea (adult) (pediatric): Secondary | ICD-10-CM | POA: Diagnosis present

## 2024-08-04 DIAGNOSIS — I5032 Chronic diastolic (congestive) heart failure: Secondary | ICD-10-CM | POA: Diagnosis present

## 2024-08-04 MED ORDER — FUROSEMIDE 40 MG PO TABS: 40.0000 mg | ORAL_TABLET | Freq: Every day | ORAL | 2 refills | Status: AC

## 2024-08-04 NOTE — Patient Instructions (Addendum)
 Medication Instructions:   Your physician recommends the following medication changes.  Furosemide  (Lasix ) -- Take an extra 20 mg (1/2 tablet) daily as needed for weight gain of 3 lbs or more in one day (24 hours) or 5 lbs or more in a week (7 days).    *If you need a refill on your cardiac medications before your next appointment, please call your pharmacy*  Lab Work:  Your provider would like for you to have following labs drawn today BMet, CBC.    If you have labs (blood work) drawn today and your tests are completely normal, you will receive your results only by:  MyChart Message (if you have MyChart) OR  A paper copy in the mail If you have any lab test that is abnormal or we need to change your treatment, we will call you to review the results.  Testing/Procedures:  None ordered at this time   Referrals:  Your cardiologist has referred you to Cardiology Electrophysiology.  We have attached their office location and phone number below.  Please allow them 3-5 business days to reach out to you to make an appointment.  If you have not heard from their office within that time, please call them to schedule your appointment.    Follow-Up:  At [facility], you and your health needs are our priority.  As part of our continuing mission to provide you with exceptional heart care, our providers are all part of one team.  This team includes your primary Cardiologist (physician) and Advanced Practice Providers or APPs (Physician Assistants and Nurse Practitioners) who all work together to provide you with the care you need, when you need it.  Your next appointment:   3 month(s)  Provider:    Redell Cave, MD or Barnie Hila, NP    We recommend signing up for the patient portal called MyChart.  Sign up information is provided on this After Visit Summary.  MyChart is used to connect with patients for Virtual Visits (Telemedicine).  Patients are able to view lab/test  results, encounter notes, upcoming appointments, etc.  Non-urgent messages can be sent to your provider as well.   To learn more about what you can do with MyChart, go to [email or web address].   Other Instructions  It is important to weigh yourself every day. Do it first thing in the morning, after emptying your bladder, before breakfast and with minimal clothing weight. Record your weight below. Call your doctor if you gain more than 3 lbs (1kg) in two days, or 5 lbs (2.5kg) in one week.

## 2024-08-05 ENCOUNTER — Other Ambulatory Visit: Payer: Self-pay

## 2024-08-05 ENCOUNTER — Observation Stay
Admission: EM | Admit: 2024-08-05 | Discharge: 2024-08-06 | Disposition: A | Source: Ambulatory Visit | Attending: Internal Medicine | Admitting: Internal Medicine

## 2024-08-05 ENCOUNTER — Ambulatory Visit: Payer: Self-pay | Admitting: Student

## 2024-08-05 ENCOUNTER — Encounter: Payer: Self-pay | Admitting: Emergency Medicine

## 2024-08-05 ENCOUNTER — Other Ambulatory Visit
Admission: RE | Admit: 2024-08-05 | Discharge: 2024-08-05 | Disposition: A | Source: Ambulatory Visit | Attending: Student | Admitting: Student

## 2024-08-05 DIAGNOSIS — J45909 Unspecified asthma, uncomplicated: Secondary | ICD-10-CM | POA: Insufficient documentation

## 2024-08-05 DIAGNOSIS — D649 Anemia, unspecified: Principal | ICD-10-CM | POA: Diagnosis present

## 2024-08-05 DIAGNOSIS — I5032 Chronic diastolic (congestive) heart failure: Secondary | ICD-10-CM | POA: Diagnosis present

## 2024-08-05 DIAGNOSIS — J449 Chronic obstructive pulmonary disease, unspecified: Secondary | ICD-10-CM | POA: Diagnosis present

## 2024-08-05 DIAGNOSIS — F418 Other specified anxiety disorders: Secondary | ICD-10-CM | POA: Insufficient documentation

## 2024-08-05 DIAGNOSIS — Z6841 Body Mass Index (BMI) 40.0 and over, adult: Secondary | ICD-10-CM | POA: Insufficient documentation

## 2024-08-05 DIAGNOSIS — Z79899 Other long term (current) drug therapy: Secondary | ICD-10-CM | POA: Diagnosis not present

## 2024-08-05 DIAGNOSIS — E039 Hypothyroidism, unspecified: Secondary | ICD-10-CM | POA: Diagnosis present

## 2024-08-05 DIAGNOSIS — J9611 Chronic respiratory failure with hypoxia: Secondary | ICD-10-CM | POA: Diagnosis not present

## 2024-08-05 DIAGNOSIS — I110 Hypertensive heart disease with heart failure: Secondary | ICD-10-CM | POA: Insufficient documentation

## 2024-08-05 DIAGNOSIS — Z87891 Personal history of nicotine dependence: Secondary | ICD-10-CM | POA: Diagnosis not present

## 2024-08-05 DIAGNOSIS — I480 Paroxysmal atrial fibrillation: Secondary | ICD-10-CM | POA: Diagnosis not present

## 2024-08-05 DIAGNOSIS — E782 Mixed hyperlipidemia: Secondary | ICD-10-CM | POA: Diagnosis present

## 2024-08-05 DIAGNOSIS — E66813 Obesity, class 3: Secondary | ICD-10-CM

## 2024-08-05 DIAGNOSIS — F17200 Nicotine dependence, unspecified, uncomplicated: Secondary | ICD-10-CM | POA: Diagnosis present

## 2024-08-05 DIAGNOSIS — R710 Precipitous drop in hematocrit: Secondary | ICD-10-CM

## 2024-08-05 DIAGNOSIS — I10 Essential (primary) hypertension: Secondary | ICD-10-CM | POA: Diagnosis present

## 2024-08-05 DIAGNOSIS — F419 Anxiety disorder, unspecified: Secondary | ICD-10-CM | POA: Diagnosis present

## 2024-08-05 LAB — DIFFERENTIAL
Abs Immature Granulocytes: 0.02 10*3/uL (ref 0.00–0.07)
Basophils Absolute: 0 10*3/uL (ref 0.0–0.1)
Basophils Relative: 1 %
Eosinophils Absolute: 0.2 10*3/uL (ref 0.0–0.5)
Eosinophils Relative: 2 %
Immature Granulocytes: 0 %
Lymphocytes Relative: 25 %
Lymphs Abs: 2.1 10*3/uL (ref 0.7–4.0)
Monocytes Absolute: 0.6 10*3/uL (ref 0.1–1.0)
Monocytes Relative: 7 %
Neutro Abs: 5.6 10*3/uL (ref 1.7–7.7)
Neutrophils Relative %: 65 %

## 2024-08-05 LAB — RETICULOCYTES
Immature Retic Fract: 18.2 % — ABNORMAL HIGH (ref 2.3–15.9)
RBC.: 3.77 MIL/uL — ABNORMAL LOW (ref 4.22–5.81)
Retic Count, Absolute: 55.4 10*3/uL (ref 19.0–186.0)
Retic Ct Pct: 1.5 % (ref 0.4–3.1)

## 2024-08-05 LAB — TECHNOLOGIST SMEAR REVIEW: Plt Morphology: NORMAL

## 2024-08-05 LAB — CBC
HCT: 33.6 % — ABNORMAL LOW (ref 39.0–52.0)
HCT: 35.7 % — ABNORMAL LOW (ref 39.0–52.0)
Hematocrit: 34.2 % — ABNORMAL LOW (ref 37.5–51.0)
Hemoglobin: 11.6 g/dL — ABNORMAL LOW (ref 13.0–17.7)
Hemoglobin: 11 g/dL — ABNORMAL LOW (ref 13.0–17.0)
Hemoglobin: 11.4 g/dL — ABNORMAL LOW (ref 13.0–17.0)
MCH: 30.4 pg (ref 26.6–33.0)
MCH: 30 pg (ref 26.0–34.0)
MCH: 29.7 pg (ref 26.0–34.0)
MCHC: 33.9 g/dL (ref 31.5–35.7)
MCHC: 32.7 g/dL (ref 30.0–36.0)
MCHC: 31.9 g/dL (ref 30.0–36.0)
MCV: 90 fL (ref 79–97)
MCV: 91.6 fL (ref 80.0–100.0)
MCV: 93 fL (ref 80.0–100.0)
Platelets: 232 10*3/uL (ref 150–450)
Platelets: 194 10*3/uL (ref 150–400)
Platelets: 218 10*3/uL (ref 150–400)
RBC: 3.82 x10E6/uL — ABNORMAL LOW (ref 4.14–5.80)
RBC: 3.67 MIL/uL — ABNORMAL LOW (ref 4.22–5.81)
RBC: 3.84 MIL/uL — ABNORMAL LOW (ref 4.22–5.81)
RDW: 12.8 % (ref 11.6–15.4)
RDW: 13.2 % (ref 11.5–15.5)
RDW: 13.2 % (ref 11.5–15.5)
WBC: 10.4 10*3/uL (ref 3.4–10.8)
WBC: 9.1 10*3/uL (ref 4.0–10.5)
WBC: 8.4 10*3/uL (ref 4.0–10.5)
nRBC: 0 % (ref 0.0–0.2)
nRBC: 0 % (ref 0.0–0.2)

## 2024-08-05 LAB — ABO/RH: ABO/RH(D): A NEG

## 2024-08-05 LAB — COMPREHENSIVE METABOLIC PANEL WITH GFR
ALT: 22 U/L (ref 0–44)
AST: 15 U/L (ref 15–41)
Albumin: 4.1 g/dL (ref 3.5–5.0)
Alkaline Phosphatase: 90 U/L (ref 38–126)
Anion gap: 10 (ref 5–15)
BUN: 15 mg/dL (ref 6–20)
CO2: 33 mmol/L — ABNORMAL HIGH (ref 22–32)
Calcium: 9 mg/dL (ref 8.9–10.3)
Chloride: 98 mmol/L (ref 98–111)
Creatinine, Ser: 1.03 mg/dL (ref 0.61–1.24)
GFR, Estimated: >60 mL/min
Glucose, Bld: 102 mg/dL — ABNORMAL HIGH (ref 70–99)
Potassium: 4.3 mmol/L (ref 3.5–5.1)
Sodium: 141 mmol/L (ref 135–145)
Total Bilirubin: 0.4 mg/dL (ref 0.0–1.2)
Total Protein: 6.8 g/dL (ref 6.5–8.1)

## 2024-08-05 LAB — BASIC METABOLIC PANEL WITH GFR
BUN/Creatinine Ratio: 16 (ref 10–24)
BUN: 16 mg/dL (ref 8–27)
CO2: 29 mmol/L (ref 20–29)
Calcium: 9 mg/dL (ref 8.6–10.2)
Chloride: 94 mmol/L — ABNORMAL LOW (ref 96–106)
Creatinine, Ser: 1.03 mg/dL (ref 0.76–1.27)
Glucose: 76 mg/dL (ref 70–99)
Potassium: 4 mmol/L (ref 3.5–5.2)
Sodium: 140 mmol/L (ref 134–144)
eGFR: 83 mL/min/{1.73_m2}

## 2024-08-05 LAB — IRON AND TIBC
Iron: 29 ug/dL — ABNORMAL LOW (ref 45–182)
Saturation Ratios: 11 % — ABNORMAL LOW (ref 17.9–39.5)
TIBC: 274 ug/dL (ref 250–450)
UIBC: 245 ug/dL

## 2024-08-05 LAB — PRO BRAIN NATRIURETIC PEPTIDE: Pro Brain Natriuretic Peptide: 107 pg/mL

## 2024-08-05 LAB — MAGNESIUM: Magnesium: 1.6 mg/dL — ABNORMAL LOW (ref 1.7–2.4)

## 2024-08-05 LAB — FERRITIN: Ferritin: 252 ng/mL (ref 24–336)

## 2024-08-05 MED ORDER — FUROSEMIDE 40 MG PO TABS
40.0000 mg | ORAL_TABLET | Freq: Every day | ORAL | Status: DC
  Administered 2024-08-06: 40 mg via ORAL
  Filled 2024-08-05: qty 1

## 2024-08-05 MED ORDER — MAGNESIUM SULFATE 4 GM/100ML IV SOLN
4.0000 g | Freq: Once | INTRAVENOUS | Status: AC
  Administered 2024-08-05: 4 g via INTRAVENOUS
  Filled 2024-08-05: qty 100

## 2024-08-05 MED ORDER — ONDANSETRON HCL 4 MG/2ML IJ SOLN: 4.0000 mg | Freq: Four times a day (QID) | INTRAMUSCULAR | Status: DC | PRN

## 2024-08-05 MED ORDER — PANTOPRAZOLE SODIUM 40 MG PO TBEC
40.0000 mg | DELAYED_RELEASE_TABLET | Freq: Every day | ORAL | Status: DC
  Administered 2024-08-06: 40 mg via ORAL
  Filled 2024-08-05: qty 1

## 2024-08-05 MED ORDER — DILTIAZEM HCL ER COATED BEADS 240 MG PO CP24
240.0000 mg | ORAL_CAPSULE | Freq: Every day | ORAL | Status: DC
  Administered 2024-08-06: 240 mg via ORAL
  Filled 2024-08-05: qty 1

## 2024-08-05 MED ORDER — METOPROLOL TARTRATE 50 MG PO TABS
100.0000 mg | ORAL_TABLET | Freq: Two times a day (BID) | ORAL | Status: DC
  Administered 2024-08-05 – 2024-08-06 (×2): 100 mg via ORAL
  Filled 2024-08-05 (×2): qty 2

## 2024-08-05 MED ORDER — SODIUM CHLORIDE 0.9% FLUSH
3.0000 mL | Freq: Two times a day (BID) | INTRAVENOUS | Status: DC
  Administered 2024-08-05: 3 mL via INTRAVENOUS

## 2024-08-05 MED ORDER — NICOTINE 14 MG/24HR TD PT24
14.0000 mg | MEDICATED_PATCH | Freq: Every day | TRANSDERMAL | Status: DC
  Administered 2024-08-06: 14 mg via TRANSDERMAL
  Filled 2024-08-05: qty 1

## 2024-08-05 MED ORDER — LEVOTHYROXINE SODIUM 50 MCG PO TABS
50.0000 ug | ORAL_TABLET | Freq: Every day | ORAL | Status: DC
  Administered 2024-08-06: 50 ug via ORAL
  Filled 2024-08-05: qty 1

## 2024-08-05 MED ORDER — SENNOSIDES-DOCUSATE SODIUM 8.6-50 MG PO TABS: 1.0000 | ORAL_TABLET | Freq: Every evening | ORAL | Status: DC | PRN

## 2024-08-05 MED ORDER — ONDANSETRON HCL 4 MG PO TABS: 4.0000 mg | ORAL_TABLET | Freq: Four times a day (QID) | ORAL | Status: DC | PRN

## 2024-08-05 MED ORDER — ATORVASTATIN CALCIUM 20 MG PO TABS
40.0000 mg | ORAL_TABLET | Freq: Every day | ORAL | Status: DC
  Administered 2024-08-06: 40 mg via ORAL
  Filled 2024-08-05: qty 2

## 2024-08-05 MED ORDER — ACETAMINOPHEN 650 MG RE SUPP: 650.0000 mg | Freq: Four times a day (QID) | RECTAL | Status: DC | PRN

## 2024-08-05 MED ORDER — ACETAMINOPHEN 325 MG PO TABS
650.0000 mg | ORAL_TABLET | Freq: Four times a day (QID) | ORAL | Status: DC | PRN
  Administered 2024-08-05: 650 mg via ORAL
  Filled 2024-08-05: qty 2

## 2024-08-05 MED ORDER — FOLIC ACID 1 MG PO TABS
1.0000 mg | ORAL_TABLET | Freq: Every day | ORAL | Status: DC
  Administered 2024-08-06: 1 mg via ORAL
  Filled 2024-08-05: qty 1

## 2024-08-05 MED ORDER — ALBUTEROL SULFATE (2.5 MG/3ML) 0.083% IN NEBU: 3.0000 mL | INHALATION_SOLUTION | Freq: Four times a day (QID) | RESPIRATORY_TRACT | Status: DC | PRN

## 2024-08-05 NOTE — Telephone Encounter (Signed)
-----   Message from Barnie Hila, NP sent at 08/05/2024  8:15 AM EST ----- Please let patient know his hemoglobin has dropped significantly from 1/18. Please ask him if he has noticed any unusual bleeding (blood in stool or urine).  I would like him to go to the medical  mall to have this repeated to make sure it is accurate. If it remains low he will need to see his PCP ASAP. Please send results to his PCP.   Thank you!  DW

## 2024-08-05 NOTE — Telephone Encounter (Signed)
 Called and spoke with the patient to inform him of the most recent lab results as interpreted by Barnie Hila, NP.  Patient denies any black stool, coffee ground emesis, or any other bleeding occurrences.  Advised patient of the order for a STAT CBC to be collected at the [facility] for confirmation of the low hemoglobin.  Patient states I'll try to get there today.  Attempted to impress upon the patient the importance of getting the blood work done.  Patient verbalized understanding.  Lab results forwarded to PCP Elvira Patrick, FNP.  Orders for STAT CBC placed.  All other questions and concerns addressed at this time.

## 2024-08-05 NOTE — Telephone Encounter (Signed)
-----   Message from Barnie Hila, NP sent at 08/05/2024 10:07 AM EST ----- Patient's hemoglobin is confirmed low (and even a little lower than yesterday). Please contact him and let him know I recommend he go to the ED for further workup.   Thank you!  DW

## 2024-08-05 NOTE — ED Triage Notes (Addendum)
 Patient to ED via POV from home. Patient was sent by his Dr due to low hemoglobin. Patient states he recently was discharged from the hospital after having pneumonia and was being seen for a follow up appointment yesterday. His latest blood work on 1/18 showed a hemoglobin of 16.9. Yesterday his hemoglobin was 11.6 and today it was 11.0. Patient does take blood thinners. Patient denies dark stools or vomiting. Patient does endorse weakness. Patient currently on 2L [place].

## 2024-08-05 NOTE — Telephone Encounter (Signed)
 Called and spoke with the patient to inform him of the most recent lab results as interpreted by Barnie Hila, NP as well as the recommendation to go the Emergency Room for a thorough work up.  Patient verbalized understanding and agreement with the treatment plan and explicitly stated that he will go to the Emergency Room.  All other questions and concerns addressed at this time. Lab results forwarded to PCP.

## 2024-08-05 NOTE — Telephone Encounter (Signed)
 Thank you for letting me know. I will reach out to him as well to get in to see me.

## 2024-08-05 NOTE — ED Provider Notes (Signed)
 "  [facility] Provider Note    Event Date/Time   First MD Initiated Contact with Patient 08/05/24 1326     (approximate)   History   abnormal labs   HPI  Ryan Hatfield is a 59 y.o. male past medical history significant for COPD on chronic 2 L of oxygen , hypertension, HFrEF, atrial flutter on Xarelto , recent hospital admission, presents to the emergency department with concerns for anemia.  Patient was discharged from the hospital on 07/25/2024.  Had a follow-up appointment with primary care physician yesterday and checked hemoglobin and had significant drop so sent to the emergency department.  Denies any blood in the stool or black dark tarry stool.  No abdominal pain or nausea or vomiting.  Consistent with Xarelto  use.  No new worsening shortness of breath.  Endorses generalized weakness that has been ongoing since he left the hospital.  No current tobacco use.  States that he had a follow-up visit with his cardiologist for possible cardioversion for his atrial flutter but had self converted back into normal sinus rhythm.     Physical Exam   Triage Vital Signs: ED Triage Vitals  Encounter Vitals Group     BP 08/05/24 1225 (!) 127/56     Girls Systolic BP Percentile --      Girls Diastolic BP Percentile --      Boys Systolic BP Percentile --      Boys Diastolic BP Percentile --      Pulse Rate 08/05/24 1225 61     Resp 08/05/24 1225 17     Temp 08/05/24 1225 98.1 F (36.7 C)     Temp Source 08/05/24 1225 Oral     SpO2 08/05/24 1225 96 %     Weight 08/05/24 1234 280 lb (127 kg)     Height 08/05/24 1234 5' 9 (1.753 m)     Head Circumference --      Peak Flow --      Pain Score 08/05/24 1234 0     Pain Loc --      Pain Education --      Exclude from Growth Chart --     Most recent vital signs: Vitals:   08/05/24 1225 08/05/24 1738  BP: (!) 127/56 119/64  Pulse: 61 61  Resp: 17 18  Temp: 98.1 F (36.7 C) 98.1 F (36.7 C)  SpO2: 96% 100%     Physical Exam Exam conducted with a chaperone present.  Constitutional:      Appearance: He is well-developed.  HENT:     Head: Atraumatic.  Eyes:     Conjunctiva/sclera: Conjunctivae normal.  Cardiovascular:     Rate and Rhythm: Regular rhythm.  Pulmonary:     Effort: No respiratory distress.     Comments: 2 L home oxygen  Genitourinary:    Comments: No blood in the stool, no melanotic stool Musculoskeletal:     Cervical back: Normal range of motion.  Skin:    General: Skin is warm.     Capillary Refill: Capillary refill takes less than 2 seconds.  Neurological:     Mental Status: He is alert. Mental status is at baseline.     IMPRESSION / MDM / ASSESSMENT AND PLAN / ED COURSE  I reviewed the triage vital signs and the nursing notes.  Differential diagnosis including anemia of chronic disease, GI bleed, anemia from blood draws   LABS (all labs ordered are listed, but only abnormal results are displayed) Labs interpreted  as -    Labs Reviewed  COMPREHENSIVE METABOLIC PANEL WITH GFR - Abnormal; Notable for the following components:      Result Value   CO2 33 (*)    Glucose, Bld 102 (*)    All other components within normal limits  CBC - Abnormal; Notable for the following components:   RBC 3.84 (*)    Hemoglobin 11.4 (*)    HCT 35.7 (*)    All other components within normal limits  MAGNESIUM  - Abnormal; Notable for the following components:   Magnesium  1.6 (*)    All other components within normal limits  IRON AND TIBC - Abnormal; Notable for the following components:   Iron 29 (*)    Saturation Ratios 11 (*)    All other components within normal limits  RETICULOCYTES - Abnormal; Notable for the following components:   RBC. 3.77 (*)    Immature Retic Fract 18.2 (*)    All other components within normal limits  PRO BRAIN NATRIURETIC PEPTIDE  FERRITIN  TECHNOLOGIST SMEAR REVIEW  DIFFERENTIAL  URINALYSIS, ROUTINE W REFLEX MICROSCOPIC  BASIC METABOLIC  PANEL WITH GFR  CBC  CBG MONITORING, ED  TYPE AND SCREEN  ABO/RH     MDM  Hemoglobin today is 11.4, on chart review hemoglobin at time of discharge on 1/18 was 16.9, 1/17 was 15.9, 1/14 was 15.4.  Appears to be normocytic.  Normal platelets.  No leukocytosis.  No current signs or symptoms of a GI bleed.  Creatinine appears to be at baseline.  BUN is normal.  Discussed observation with the hospitalist given significant drop of hemoglobin.  No signs or symptoms currently concerning for GI bleed.     PROCEDURES:  Critical Care performed: No  Procedures  Patient's presentation is most consistent with acute presentation with potential threat to life or bodily function.   MEDICATIONS ORDERED IN ED: Medications  acetaminophen  (TYLENOL ) tablet 650 mg (has no administration in time range)    Or  acetaminophen  (TYLENOL ) suppository 650 mg (has no administration in time range)  senna-docusate (Senokot-S) tablet 1 tablet (has no administration in time range)  ondansetron  (ZOFRAN ) tablet 4 mg (has no administration in time range)    Or  ondansetron  (ZOFRAN ) injection 4 mg (has no administration in time range)    FINAL CLINICAL IMPRESSION(S) / ED DIAGNOSES   Final diagnoses:  Anemia, unspecified type     Rx / DC Orders   ED Discharge Orders     None        Note:  This document was prepared using Dragon voice recognition software and may include unintentional dictation errors.   Suzanne Kirsch, MD 08/05/24 1832  "

## 2024-08-05 NOTE — Telephone Encounter (Signed)
 Please call pt as well and see if he can get into the office early next week in regards to the new anemia, there is worry there may be a bleed, of course pending the repeat CBC to confirm.   As advised by pulmonary be sure to monitor for any blood in the stool recurrent nose bleeds etc.   Don't schedule Monday in case of weather

## 2024-08-05 NOTE — H&P (Addendum)
 " History and Physical    Ryan Hatfield FMW:[id number] DOB: Oct 04, 1965 DOA: 08/05/2024  DOS: the patient was seen and examined on 08/05/2024  PCP: Corwin Antu, FNP   Patient coming from: Home  I have personally briefly reviewed patient's old medical records in [facility] and CareEverywhere  HPI:   Ryan Hatfield is a 59 y.o. year old male with medical history of hypertension, hyperlipidemia, atrial fibrillation, COPD, hypothyroidism, tobacco use disorder in remission presenting to the ED at request of his PCP for a decrease in hemoglobin.  Patient was recently admitted for acute on chronic hypoxic respiratory failure from 01/07-01/19.  Patient reports generalized weakness since discharge from the hospital but denies any rectal bleeding, hematuria, hematemesis. On arrival to the ED patient was noted to be HDS stable.  Lab work obtained.  CBC with normal leukocyte count, mild anemia at 11 with baseline around 14.  CMP overall unremarkable.  proBNP normal.  Given decline in hemoglobin, [facility] contacted for admission.  Review of Systems: As mentioned in the history of present illness. All other systems reviewed and are negative.   Past Medical History:  Diagnosis Date   Acute appendicitis    Alcohol abuse, in remission 05/29/2015   Anxiety and depression 05/29/2015   Arthritis, degenerative 02/03/2013   Overview:  Of right middle finger MCP joint    Cannot sleep 02/03/2013   Chest pain 01/15/2013   Hypertension    Plantar fasciitis of left foot 01/17/2016    Past Surgical History:  Procedure Laterality Date   CARDIOVERSION N/A 02/04/2023   Procedure: CARDIOVERSION;  Surgeon: Kate Lonni CROME, MD;  Location: [facility] INVASIVE CV LAB;  Service: Cardiovascular;  Laterality: N/A;   LAPAROSCOPIC APPENDECTOMY N/A 11/25/2017   Procedure: APPENDECTOMY LAPAROSCOPIC;  Surgeon: Wonda Charlie BRAVO, MD;  Location: [facility] ORS;  Service: General;  Laterality: N/A;   RIGHT/LEFT HEART CATH AND  CORONARY ANGIOGRAPHY N/A 02/02/2023   Procedure: RIGHT/LEFT HEART CATH AND CORONARY ANGIOGRAPHY;  Surgeon: Burnard Debby LABOR, MD;  Location: [facility] INVASIVE CV LAB;  Service: Cardiovascular;  Laterality: N/A;   SEPTOPLASTY     TEE WITHOUT CARDIOVERSION N/A 02/04/2023   Procedure: TRANSESOPHAGEAL ECHOCARDIOGRAM;  Surgeon: Kate Lonni CROME, MD;  Location: [facility] INVASIVE CV LAB;  Service: Cardiovascular;  Laterality: N/A;   TONSILLECTOMY       Allergies[1]  Family History  Problem Relation Age of Onset   Heart disease Mother    Arthritis Mother    Heart disease Father    Diabetes Brother    Stroke Brother    Cancer Maternal Grandmother    Prostate cancer Paternal Grandfather    Kidney cancer Paternal Uncle     Prior to Admission medications  Medication Sig Start Date End Date Taking? Authorizing Provider  albuterol  (PROVENTIL ) (2.5 MG/3ML) 0.083% nebulizer solution Inhale 3 mLs (2.5 mg total) into the lungs every 4 (four) hours as needed for wheezing or shortness of breath. 07/25/24  Yes Agbata, Tochukwu, MD  albuterol  (VENTOLIN  HFA) 108 (90 Base) MCG/ACT inhaler Inhale 2 puffs into the lungs every 6 (six) hours as needed for wheezing or shortness of breath. 07/25/24  Yes Agbata, Tochukwu, MD  atorvastatin  (LIPITOR) 40 MG tablet Take 1 tablet (40 mg total) by mouth daily. 07/28/24  Yes Dugal, Antu, FNP  Cholecalciferol  (VITAMIN D3) 1.25 MG (50000 UT) CAPS Take 1 capsule by mouth once a week. 07/30/24  Yes [provider]  diltiazem  (CARDIZEM  CD) 240 MG 24 hr capsule Take 1 capsule (240  mg total) by mouth daily. 07/26/24 08/25/24 Yes Agbata, Tochukwu, MD  folic acid  (FOLVITE ) 1 MG tablet Take 1 tablet (1 mg total) by mouth daily. 07/28/24  Yes Dugal, Tabitha, FNP  furosemide  (LASIX ) 40 MG tablet Take 1 tablet (40 mg total) by mouth daily. Take an extra 20 mg (1/2 tablet) as needed for weight gain. 08/04/24 08/04/25 Yes Wittenborn, Deborah, NP  levothyroxine  (SYNTHROID ) 50 MCG tablet TAKE 1  TABLET BY MOUTH EVERY DAY 10/12/23  Yes Dugal, Tabitha, FNP  losartan  (COZAAR ) 25 MG tablet Take 1 tablet (25 mg total) by mouth daily. 07/26/24 08/25/24 Yes Agbata, Tochukwu, MD  metFORMIN  (GLUCOPHAGE -XR) 500 MG 24 hr tablet Take 1 tablet (500 mg total) by mouth daily with breakfast. 07/25/24 08/24/24 Yes Agbata, Tochukwu, MD  metoprolol  tartrate (LOPRESSOR ) 100 MG tablet Take 1 tablet (100 mg total) by mouth 2 (two) times daily. 07/25/24 08/24/24 Yes Agbata, Tochukwu, MD  mometasone -formoterol  (DULERA ) 200-5 MCG/ACT AERO Inhale 1 puff into the lungs 2 (two) times daily. 07/25/24 08/24/24 Yes Agbata, Tochukwu, MD  nicotine  (NICODERM CQ  - DOSED IN MG/24 HOURS) 14 mg/24hr patch Place 1 patch (14 mg total) onto the skin daily. 07/28/24  Yes Dugal, Ginger, FNP  nitroGLYCERIN  (NITROSTAT ) 0.4 MG SL tablet Place 1 tablet (0.4 mg total) under the tongue every 5 (five) minutes as needed for chest pain. 02/17/24  Yes Wittenborn, Barnie, NP  omeprazole  (PRILOSEC) 20 MG capsule TAKE 1 CAPSULE BY MOUTH EVERY DAY 04/06/24  Yes Wittenborn, Barnie, NP  rivaroxaban  (XARELTO ) 20 MG TABS tablet Take 1 tablet (20 mg total) by mouth daily with supper. 07/25/24 08/24/24 Yes Agbata, Aimee, MD  ACCU-CHEK GUIDE TEST test strip  07/25/24   [provider]  Blood Glucose Monitoring Suppl (BLOOD GLUCOSE MONITOR SYSTEM) w/Device KIT Use as directed to check blood sugar four times daily. 07/25/24   Agbata, Tochukwu, MD  Glucose Blood (BLOOD GLUCOSE TEST STRIPS) STRP Use as directed to check blood sugar four times daily. 07/25/24   Lanetta Aimee, MD  Lancet Device MISC 1 each by Does not apply route as directed. Dispense based on patient and insurance preference. Use up to four times daily as directed. (FOR ICD-10 E10.9, E11.9). 07/25/24   Lanetta Aimee, MD  Lancets MISC Use as directed to check blood sugar four times daily. 07/25/24   Lanetta Aimee, MD  Nebulizers (COMPRESSOR/NEBULIZER) MISC 1 ampule by Does not apply route  every 6 (six) hours as needed (SOB/Wheezing). 07/25/24 08/24/24  Lanetta Aimee, MD  tirzepatide  (ZEPBOUND ) 2.5 MG/0.5ML Pen Inject 2.5 mg into the skin once a week. Patient not taking: Reported on 08/04/2024 07/29/24   Corwin Ginger, FNP    Social History:  reports that he quit smoking about 3 weeks ago. His smoking use included cigarettes. He has a 40 pack-year smoking history. He has never used smokeless tobacco. He reports current alcohol use. He reports that he does not use drugs. Currently living with his son.  Has stopped smoking since discharge.  Reports he is not drinking alcohol.  He is independent in ADLs and IADLs.   Physical Exam: Vitals:   08/05/24 1225 08/05/24 1234  BP: (!) 127/56   Pulse: 61   Resp: 17   Temp: 98.1 F (36.7 C)   TempSrc: Oral   SpO2: 96%   Weight:  127 kg  Height:  5' 9 (1.753 m)    Gen: NAD HENT: NCAT CV: Regular rate and rhythm, good pulses Lung: CTAB Abd: No TTP, normal bowel sounds MSK:  No asymmetry, good bulk and tone Neuro: alert and oriented x 4   Labs on Admission: I have personally reviewed following labs and imaging studies  CBC: Recent Labs  Lab 08/04/24 1111 08/05/24 0946 08/05/24 1235  WBC 10.4 9.1 8.4  HGB 11.6* 11.0* 11.4*  HCT 34.2* 33.6* 35.7*  MCV 90 91.6 93.0  PLT 232 194 218   Basic Metabolic Panel: Recent Labs  Lab 08/04/24 1111 08/05/24 1235  NA 140 141  K 4.0 4.3  CL 94* 98  CO2 29 33*  GLUCOSE 76 102*  BUN 16 15  CREATININE 1.03 1.03  CALCIUM  9.0 9.0   GFR: Estimated Creatinine Clearance: 100.5 mL/min (by C-G formula based on SCr of 1.03 mg/dL). Liver Function Tests: Recent Labs  Lab 08/05/24 1235  AST 15  ALT 22  ALKPHOS 90  BILITOT 0.4  PROT 6.8  ALBUMIN 4.1   No results for input(s): LIPASE, AMYLASE in the last 168 hours. No results for input(s): AMMONIA in the last 168 hours. Coagulation Profile: No results for input(s): INR, PROTIME in the last 168 hours. Cardiac  Enzymes: No results for input(s): CKTOTAL, CKMB, CKMBINDEX, TROPONINI, TROPONINIHS in the last 168 hours. BNP (last 3 results) No results for input(s): BNP in the last 8760 hours. HbA1C: No results for input(s): HGBA1C in the last 72 hours. CBG: No results for input(s): GLUCAP in the last 168 hours. Lipid Profile: No results for input(s): CHOL, HDL, LDLCALC, TRIG, CHOLHDL, LDLDIRECT in the last 72 hours. Thyroid  Function Tests: No results for input(s): TSH, T4TOTAL, FREET4, T3FREE, THYROIDAB in the last 72 hours. Anemia Panel: No results for input(s): VITAMINB12, FOLATE, FERRITIN, TIBC, IRON, RETICCTPCT in the last 72 hours. Urine analysis:    Component Value Date/Time   COLORURINE AMBER (A) 11/25/2017 1618   APPEARANCEUR CLEAR (A) 11/25/2017 1618   LABSPEC 1.028 11/25/2017 1618   PHURINE 5.0 11/25/2017 1618   GLUCOSEU NEGATIVE 11/25/2017 1618   HGBUR SMALL (A) 11/25/2017 1618   BILIRUBINUR negative 10/21/2022 1103   KETONESUR NEGATIVE 11/25/2017 1618   PROTEINUR Positive (A) 10/21/2022 1103   PROTEINUR 30 (A) 11/25/2017 1618   UROBILINOGEN 0.2 10/21/2022 1103   NITRITE negative 10/21/2022 1103   NITRITE NEGATIVE 11/25/2017 1618   LEUKOCYTESUR Moderate (2+) (A) 10/21/2022 1103    Radiological Exams on Admission: I have personally reviewed images No results found.  EKG: My personal interpretation of EKG shows: Normal sinus rhythm without any acute ST changes.    Assessment/Plan Principal Problem:   Anemia Active Problems:   Essential hypertension   Acquired hypothyroidism   Tobacco use disorder   Anxiety and depression   Mixed hyperlipidemia   Paroxysmal atrial fibrillation (HCC)   COPD (chronic obstructive pulmonary disease) (HCC)   Patient with hemoglobin drop from baseline without any report of blood loss.  Differential diagnosis is broad for this including iatrogenic versus hemolytic anemia versus GI loss versus  hematuria.  There is high likelihood that this may be iatrogenic in setting of prolonged hospitalization with repeat blood draws.  Baseline appears to be around 14. Hemolytic is unlikely given normal bilirubin.  Will get smear to look for schistocytes.  Will get iron studies including reticulocyte count.  GI was will be most concerning given his age and his report of not having colonoscopy.  Will consult GI.  Will monitor his hemoglobin tomorrow AM.  Will hold his anticoagulation until hemoglobin shows stability.  Chronic Problems: Restart home meds once medication reconciliation is completed.  HTN: continue  home CCB and beta blocker but hold ARB HLD: continue home meds CHF: Continue home meds Atrial fibrillation: In sinus rhythm.  Holding home anticoagulation GERD: continue home PPI Hypothyroidism: continue home synthroid  COPD/Asthma: continue home inhalers MDD/GAD: continue home meds Weakness/Debility:likely from recent prolonged hospitalization.  PT consult placed. Tobacco use disorder: Congratulated patient on tobacco cessation.Continue NRT Class III obesity: Encourage lifestyle modifications and exercise and healthy eating and discussed benefits on overall health.   VTE prophylaxis:  SCDs  Diet: Full liquid diet Code Status:  Full Code Telemetry:  Admission status: Observation, Med-Surg Patient is from: Home Anticipated d/c is to: Home Anticipated d/c is in: 1-2 days   Family Communication: Updated at bedside  Consults called: Gastroenterology   Severity of Illness: The appropriate patient status for this patient is OBSERVATION. Observation status is judged to be reasonable and necessary in order to provide the required intensity of service to ensure the patient's safety. The patient's presenting symptoms, physical exam findings, and initial radiographic and laboratory data in the context of their medical condition is felt to place them at decreased risk for further clinical  deterioration. Furthermore, it is anticipated that the patient will be medically stable for discharge from the hospital within 2 midnights of admission.    Morene Bathe, MD [place]ol[facility] DEL. [facility]     [1]  Allergies Allergen Reactions   Lisinopril  Cough   Paroxetine     REACTION: paranoia, confusion   Sertraline Hcl     REACTION: paranoia, confusion   "

## 2024-08-05 NOTE — ED Notes (Signed)
 See triage note  Presents with some changes in his lab work  The PCP noticed a drop in Hemoglobin   Denies any pain  N/v/d or dark  stools

## 2024-08-06 DIAGNOSIS — Z7901 Long term (current) use of anticoagulants: Secondary | ICD-10-CM

## 2024-08-06 DIAGNOSIS — J9611 Chronic respiratory failure with hypoxia: Secondary | ICD-10-CM

## 2024-08-06 DIAGNOSIS — R710 Precipitous drop in hematocrit: Secondary | ICD-10-CM

## 2024-08-06 DIAGNOSIS — E66813 Obesity, class 3: Secondary | ICD-10-CM

## 2024-08-06 DIAGNOSIS — D649 Anemia, unspecified: Secondary | ICD-10-CM | POA: Diagnosis not present

## 2024-08-06 LAB — CBC
HCT: 35.8 % — ABNORMAL LOW (ref 39.0–52.0)
Hemoglobin: 11.3 g/dL — ABNORMAL LOW (ref 13.0–17.0)
MCH: 29.6 pg (ref 26.0–34.0)
MCHC: 31.6 g/dL (ref 30.0–36.0)
MCV: 93.7 fL (ref 80.0–100.0)
Platelets: 180 10*3/uL (ref 150–400)
RBC: 3.82 MIL/uL — ABNORMAL LOW (ref 4.22–5.81)
RDW: 13 % (ref 11.5–15.5)
WBC: 6.7 10*3/uL (ref 4.0–10.5)
nRBC: 0 % (ref 0.0–0.2)

## 2024-08-06 LAB — URINALYSIS, ROUTINE W REFLEX MICROSCOPIC
Bilirubin Urine: NEGATIVE
Glucose, UA: NEGATIVE mg/dL
Hgb urine dipstick: NEGATIVE
Ketones, ur: NEGATIVE mg/dL
Leukocytes,Ua: NEGATIVE
Nitrite: NEGATIVE
Protein, ur: NEGATIVE mg/dL
Specific Gravity, Urine: 1.014 (ref 1.005–1.030)
pH: 5 (ref 5.0–8.0)

## 2024-08-06 LAB — BASIC METABOLIC PANEL WITH GFR
Anion gap: 7 (ref 5–15)
BUN: 13 mg/dL (ref 6–20)
CO2: 35 mmol/L — ABNORMAL HIGH (ref 22–32)
Calcium: 8.9 mg/dL (ref 8.9–10.3)
Chloride: 98 mmol/L (ref 98–111)
Creatinine, Ser: 0.88 mg/dL (ref 0.61–1.24)
GFR, Estimated: >60 mL/min
Glucose, Bld: 123 mg/dL — ABNORMAL HIGH (ref 70–99)
Potassium: 4 mmol/L (ref 3.5–5.1)
Sodium: 139 mmol/L (ref 135–145)

## 2024-08-06 NOTE — Assessment & Plan Note (Addendum)
 Patient's hemoglobin 16.9 on 1/18. Last hemoglobin 11.3.  Hemoglobin stable while here.  No active bleeding.  Xarelto  held on admission. Gastroenterology to set up for outpatient EGD and colonoscopy.

## 2024-08-06 NOTE — Assessment & Plan Note (Signed)
 On nicotine patch

## 2024-08-06 NOTE — Hospital Course (Addendum)
 59 y.o. year old male with medical history of hypertension, hyperlipidemia, atrial fibrillation, COPD, hypothyroidism, tobacco use disorder in remission presenting to the ED at request of his PCP for a decrease in hemoglobin.  Patient was recently admitted for acute on chronic hypoxic respiratory failure from 01/07-01/19.  Patient reports generalized weakness since discharge from the hospital but denies any rectal bleeding, hematuria, hematemesis. On arrival to the ED patient was noted to be HDS stable.  Lab work obtained.  CBC with normal leukocyte count, mild anemia at 11 with baseline around 14.  CMP overall unremarkable.  proBNP normal.  Given decline in hemoglobin, [facility] contacted for admission.   1/31.  Hemoglobin 16.9 on 07/24/24.  Patient sent in for drop in hemoglobin.  Last hemoglobin 11.3.  Patient denies seeing any blood.  Gastroenterology cleared to go home and they will set up outpatient EGD and colonoscopy.

## 2024-08-06 NOTE — Assessment & Plan Note (Signed)
"   On atorvastatin         "

## 2024-08-06 NOTE — Plan of Care (Signed)

## 2024-08-06 NOTE — Discharge Summary (Signed)
 " Physician Discharge Summary   Patient: Ryan Hatfield MRN: [id number] DOB: [birth year]  Admit date:     08/05/2024  Discharge date: 08/06/24  Discharge Physician: Charlie Patterson   PCP: Corwin Antu, FNP   Recommendations at discharge:   Follow-up PCP 5 days  Discharge Diagnoses: Principal Problem:   Drop in hemoglobin Active Problems:   Paroxysmal atrial fibrillation (HCC)   Essential hypertension   Chronic respiratory failure with hypoxia (HCC)   Chronic diastolic CHF (congestive heart failure) (HCC)   Acquired hypothyroidism   Mixed hyperlipidemia   Tobacco use disorder   Obesity, Class III, BMI 40-49.9 (morbid obesity) (HCC)   Anxiety and depression   COPD (chronic obstructive pulmonary disease) (HCC)   Anemia   Hypomagnesemia  Resolved Problems:   * No resolved hospital problems. *  Hospital Course: 59 y.o. year old male with medical history of hypertension, hyperlipidemia, atrial fibrillation, COPD, hypothyroidism, tobacco use disorder in remission presenting to the ED at request of his PCP for a decrease in hemoglobin.  Patient was recently admitted for acute on chronic hypoxic respiratory failure from 01/07-01/19.  Patient reports generalized weakness since discharge from the hospital but denies any rectal bleeding, hematuria, hematemesis. On arrival to the ED patient was noted to be HDS stable.  Lab work obtained.  CBC with normal leukocyte count, mild anemia at 11 with baseline around 14.  CMP overall unremarkable.  proBNP normal.  Given decline in hemoglobin, [facility] contacted for admission.   1/31.  Hemoglobin 16.9 on 07/24/24.  Patient sent in for drop in hemoglobin.  Last hemoglobin 11.3.  Patient denies seeing any blood.  Gastroenterology cleared to go home and they will set up outpatient EGD and colonoscopy.  Assessment and Plan: * Drop in hemoglobin Patient's hemoglobin 16.9 on 1/18. Last hemoglobin 11.3.  Hemoglobin stable while here.  No active bleeding.   Xarelto  held on admission. Gastroenterology to set up for outpatient EGD and colonoscopy.  Paroxysmal atrial fibrillation (HCC) Held Xarelto .  Patient on Cardizem  CD and metoprolol .  Can go back on Xarelto  as outpatient.  Chronic respiratory failure with hypoxia (HCC) On 2 L of oxygen .  Respiratory status stable.  Essential hypertension Patient on Cardizem  CD and metoprolol   Chronic diastolic CHF (congestive heart failure) (HCC) Fluid status stable.  Euvolemic.  Continue usual medications.  Mixed hyperlipidemia On atorvastatin   Acquired hypothyroidism On levothyroxine   Tobacco use disorder On nicotine  patch  Obesity, Class III, BMI 40-49.9 (morbid obesity) (HCC) BMI 44.37  Hypomagnesemia Received IV magnesium          Consultants: Gastroenterology Procedures performed: None Disposition: Home health Diet recommendation:  Cardiac diet DISCHARGE MEDICATION: Allergies as of 08/06/2024       Reactions   Lisinopril  Cough   Paroxetine    REACTION: paranoia, confusion   Sertraline Hcl    REACTION: paranoia, confusion        Medication List     STOP taking these medications    Zepbound  2.5 MG/0.5ML Pen Generic drug: tirzepatide        TAKE these medications    Accu-Chek Guide Test test strip Generic drug: glucose blood Use as directed to check blood sugar four times daily.   Accu-Chek Guide Test test strip Generic drug: glucose blood   Accu-Chek Guide w/Device Kit Use as directed to check blood sugar four times daily.   Accu-Chek Softclix Lancets lancets Use as directed to check blood sugar four times daily.   albuterol  (2.5 MG/3ML) 0.083% nebulizer solution Commonly known  as: PROVENTIL  Inhale 3 mLs (2.5 mg total) into the lungs every 4 (four) hours as needed for wheezing or shortness of breath.   albuterol  108 (90 Base) MCG/ACT inhaler Commonly known as: VENTOLIN  HFA Inhale 2 puffs into the lungs every 6 (six) hours as needed for wheezing or  shortness of breath.   atorvastatin  40 MG tablet Commonly known as: LIPITOR Take 1 tablet (40 mg total) by mouth daily.   Comp Air Compressor Nebulizer Misc 1 ampule by Does not apply route every 6 (six) hours as needed (SOB/Wheezing).   diltiazem  240 MG 24 hr capsule Commonly known as: CARDIZEM  CD Take 1 capsule (240 mg total) by mouth daily.   Dulera  200-5 MCG/ACT Aero Generic drug: mometasone -formoterol  Inhale 1 puff into the lungs 2 (two) times daily.   folic acid  1 MG tablet Commonly known as: FOLVITE  Take 1 tablet (1 mg total) by mouth daily.   furosemide  40 MG tablet Commonly known as: LASIX  Take 1 tablet (40 mg total) by mouth daily. Take an extra 20 mg (1/2 tablet) as needed for weight gain.   Lancet Device Misc 1 each by Does not apply route as directed. Dispense based on patient and insurance preference. Use up to four times daily as directed. (FOR ICD-10 E10.9, E11.9).   levothyroxine  50 MCG tablet Commonly known as: SYNTHROID  TAKE 1 TABLET BY MOUTH EVERY DAY   losartan  25 MG tablet Commonly known as: COZAAR  Take 1 tablet (25 mg total) by mouth daily.   metFORMIN  500 MG 24 hr tablet Commonly known as: GLUCOPHAGE -XR Take 1 tablet (500 mg total) by mouth daily with breakfast.   metoprolol  tartrate 100 MG tablet Commonly known as: LOPRESSOR  Take 1 tablet (100 mg total) by mouth 2 (two) times daily.   nicotine  14 mg/24hr patch Commonly known as: NICODERM CQ  - dosed in mg/24 hours Place 1 patch (14 mg total) onto the skin daily.   nitroGLYCERIN  0.4 MG SL tablet Commonly known as: NITROSTAT  Place 1 tablet (0.4 mg total) under the tongue every 5 (five) minutes as needed for chest pain.   omeprazole  20 MG capsule Commonly known as: PRILOSEC TAKE 1 CAPSULE BY MOUTH EVERY DAY   Vitamin D3 1.25 MG (50000 UT) Caps Take 1 capsule by mouth once a week.   Xarelto  20 MG Tabs tablet Generic drug: rivaroxaban  Take 1 tablet (20 mg total) by mouth daily with  supper.        Follow-up Information     Corwin Antu, FNP Follow up in 5 day(s).   Specialty: Family Medicine Contact information: [address] [place] [address] [phone]         Melany Clotilda HERO, MD Follow up.   Specialty: Gastroenterology Why: their office will call you to set up outpatient egd and colonoscopy Contact information: [address] [place] [address] [phone]                Discharge Exam: Fredricka Weights   08/05/24 1234 08/05/24 2035 08/06/24 0319  Weight: 127 kg (!) 137.2 kg (!) 136.3 kg   Physical Exam HENT:     Head: Normocephalic.  Eyes:     General: Lids are normal.     Conjunctiva/sclera: Conjunctivae normal.  Cardiovascular:     Rate and Rhythm: Normal rate and regular rhythm.     Heart sounds: Normal heart sounds, S1 normal and S2 normal.  Pulmonary:     Breath sounds: No decreased breath sounds, wheezing,  rhonchi or rales.  Abdominal:     Palpations: Abdomen is soft.     Tenderness: There is no abdominal tenderness.  Musculoskeletal:     Right lower leg: Swelling present.     Left lower leg: Swelling present.  Skin:    General: Skin is warm.     Findings: No rash.  Neurological:     Mental Status: He is alert and oriented to person, place, and time.      Condition at discharge: stable  The results of significant diagnostics from this hospitalization (including imaging, microbiology, ancillary and laboratory) are listed below for reference.   Imaging Studies: CT Angio Chest Pulmonary Embolism (PE) W or WO Contrast Result Date: 07/17/2024 EXAM: CTA of the Chest with and without contrast for PE 07/17/2024 10:26:40 AM TECHNIQUE: CTA of the chest was performed after the administration of intravenous contrast. Multiplanar reformatted images are provided for review. MIP images are provided for review. Automated exposure control, iterative reconstruction, and/or weight based adjustment of  the mA/kV was utilized to reduce the radiation dose to as low as reasonably achievable. CONTRAST: 75 mL iohexol  (OMNIPAQUE ) 350 MG/ML injection. COMPARISON: CTA chest 01/29/2023. CLINICAL HISTORY: 59 year old male with persistent hypoxia and inconsistent blood thinner use at home. FINDINGS: PULMONARY ARTERIES: Suboptimal but adequate pulmonary artery contrast timing. No convincing pulmonary artery filling defect. Main pulmonary artery is normal in caliber. Some pulmonary artery detail limited by motion, especially in the Right upper lobe. No convincing pulmonary artery filling defect. MEDIASTINUM: The heart and pericardium demonstrate no acute abnormality. Calcified coronary artery and aortic atherosclerosis. There is no acute abnormality of the thoracic aorta. LYMPH NODES: Mediastinal lymph nodes are stable since 2024, likely reactive. LUNGS AND PLEURA: Mild respiratory motion. Sabre sheath trachea configuration suggesting chronic obstructive pulmonary disease (series 8 image 33). Atelectatic changes to the airways and some retained secretions also suspected in the Right mainstem bronchus. Superimposed generalized central bronchial wall thickening bilaterally. Tree in bud nodular opacity scattered in the Right upper lobe, throughout the Right lower lobe, and superimposed confluent peribronchial opacity in the Lingula, Posterior basal segment of the Right lower lobe, and Medial segment of the Right middle lobe. This more resembles developing consolidation than atelectasis. No pleural effusion. No pneumothorax. UPPER ABDOMEN: Similar mild contrast reflux from the right atrium into the hepatic IVC and hepatic veins. Negative visible other non-contrast upper abdominal viscera. SOFT TISSUES AND BONES: No acute bone or soft tissue abnormality. IMPRESSION: 1. No convincing pulmonary embolus; evaluation limited by motion and suboptimal contrast timing. 2. Evidence of COPD with superimposed generalized bronchitis, and  bilateral respiratory infection with multifocal early consolidation. No pleural effusion. Electronically signed by: Helayne Hurst MD MD 07/17/2024 10:55 AM EST RP Workstation: [id number]   ECHOCARDIOGRAM COMPLETE Result Date: 07/16/2024    ECHOCARDIOGRAM REPORT   Patient Name:   ISIAH SCHEEL [facility] Date of Exam: 07/16/2024 Medical Rec #:  [id number]         Height:       69.0 in Accession #:    [id number]        Weight:       308.6 lb Date of Birth:  10/21/1965         BSA:          2.484 m Patient Age:    58 years          BP:           105/61 mmHg Patient Gender: M  HR:           84 bpm. Exam Location:  [facility] Procedure: 2D Echo, Cardiac Doppler, Color Doppler and Intracardiac            Opacification Agent (Both Spectral and Color Flow Doppler were            utilized during procedure). Indications:     CHF I50.31  History:         Patient has prior history of Echocardiogram examinations, most                  recent 05/05/2023.  Sonographer:     Thedora Louder RDCS, FASE Referring Phys:  [id number] LANETA BLUNT Diagnosing Phys: Annabella Scarce MD  Sonographer Comments: Technically difficult study due to poor echo windows, suboptimal apical window and patient is obese. Image acquisition challenging due to patient body habitus and Image acquisition challenging due to respiratory motion. The patient was on 12L of oxygen  at the time of this study. IMPRESSIONS  1. Left ventricular ejection fraction, by estimation, is 60 to 65%. Left ventricular ejection fraction by PLAX is 63 %. The left ventricle has normal function. The left ventricle has no regional wall motion abnormalities. There is moderate concentric left ventricular hypertrophy. Left ventricular diastolic parameters are indeterminate.  2. Right ventricular systolic function is normal. The right ventricular size is normal.  3. The mitral valve is normal in structure. No evidence of mitral valve regurgitation. No evidence of mitral stenosis.  4.  The aortic valve is tricuspid. Aortic valve regurgitation is not visualized. No aortic stenosis is present.  5. The inferior vena cava is dilated in size with >50% respiratory variability, suggesting right atrial pressure of 8 mmHg. FINDINGS  Left Ventricle: Left ventricular ejection fraction, by estimation, is 60 to 65%. Left ventricular ejection fraction by PLAX is 63 %. The left ventricle has normal function. The left ventricle has no regional wall motion abnormalities. Definity  contrast agent was given IV to delineate the left ventricular endocardial borders. The left ventricular internal cavity size was normal in size. There is moderate concentric left ventricular hypertrophy. Left ventricular diastolic function could not be evaluated due to atrial fibrillation. Left ventricular diastolic parameters are indeterminate. Normal left ventricular filling pressure. Right Ventricle: The right ventricular size is normal. No increase in right ventricular wall thickness. Right ventricular systolic function is normal. Left Atrium: Left atrial size was normal in size. Right Atrium: Right atrial size was normal in size. Pericardium: There is no evidence of pericardial effusion. Mitral Valve: The mitral valve is normal in structure. Mild mitral annular calcification. No evidence of mitral valve regurgitation. No evidence of mitral valve stenosis. Tricuspid Valve: The tricuspid valve is normal in structure. Tricuspid valve regurgitation is not demonstrated. No evidence of tricuspid stenosis. Aortic Valve: The aortic valve is tricuspid. Aortic valve regurgitation is not visualized. No aortic stenosis is present. Aortic valve peak gradient measures 0.9 mmHg. Pulmonic Valve: The pulmonic valve was normal in structure. Pulmonic valve regurgitation is not visualized. No evidence of pulmonic stenosis. Aorta: The aortic root is normal in size and structure. Venous: The inferior vena cava is dilated in size with greater than 50%  respiratory variability, suggesting right atrial pressure of 8 mmHg. IAS/Shunts: No atrial level shunt detected by color flow Doppler.  LEFT VENTRICLE PLAX 2D LV EF:         Left            Diastology  ventricular     LV e' medial:    11.40 cm/s                ejection        LV E/e' medial:  9.4                fraction by     LV e' lateral:   9.36 cm/s                PLAX is 63      LV E/e' lateral: 11.4                %. LVIDd:         4.45 cm LVIDs:         2.95 cm LV PW:         1.40 cm LV IVS:        1.30 cm LVOT diam:     2.40 cm LVOT Area:     4.52 cm  RIGHT VENTRICLE RV Basal diam:  3.00 cm RV S prime:     11.40 cm/s TAPSE (M-mode): 1.8 cm LEFT ATRIUM             Index        RIGHT ATRIUM           Index LA diam:        3.90 cm 1.57 cm/m   RA Area:     19.80 cm LA Vol (A2C):   49.3 ml 19.84 ml/m  RA Volume:   54.30 ml  21.86 ml/m LA Vol (A4C):   34.9 ml 14.05 ml/m LA Biplane Vol: 45.6 ml 18.35 ml/m  AORTIC VALVE             PULMONIC VALVE AV Vmax:      47.10 cm/s PV Vmax:        1.14 m/s AV Peak Grad: 0.9 mmHg   PV Peak grad:   5.2 mmHg                          RVOT Peak grad: 5 mmHg AORTA Ao Root diam: 3.20 cm Ao Asc diam:  3.10 cm MITRAL VALVE MV Area (PHT): 3.30 cm     SHUNTS MV Decel Time: 230 msec     Systemic Diam: 2.40 cm MV E velocity: 107.00 cm/s Annabella Scarce MD Electronically signed by Annabella Scarce MD Signature Date/Time: 07/16/2024/1:12:06 PM    Final    DG Chest Portable 1 View Result Date: 07/13/2024 EXAM: 1 VIEW(S) XRAY OF THE CHEST 02/24/2023 06:54:20 PM COMPARISON: None available. CLINICAL HISTORY: sob FINDINGS: LUNGS AND PLEURA: Bibasilar atelectasis. No focal pulmonary opacity. No pleural effusion. No pneumothorax. HEART AND MEDIASTINUM: Heart is mildly enlarged. BONES AND SOFT TISSUES: No acute osseous abnormality. IMPRESSION: 1. Mild cardiomegaly. 2. Bibasilar atelectasis. Electronically signed by: Greig Pique MD MD 07/13/2024 06:56 PM EST RP Workstation:  [id number]    Microbiology: Results for orders placed or performed during the hospital encounter of 07/13/24  Resp panel by RT-PCR (RSV, Flu A&B, Covid) Anterior Nasal Swab     Status: None   Collection Time: 07/13/24  6:47 PM   Specimen: Anterior Nasal Swab  Result Value Ref Range Status   SARS Coronavirus 2 by RT PCR NEGATIVE NEGATIVE Final    Comment: (NOTE) SARS-CoV-2 target nucleic acids are NOT DETECTED.  The SARS-CoV-2 RNA is generally detectable in upper respiratory specimens during the acute phase of  infection. The lowest concentration of SARS-CoV-2 viral copies this assay can detect is 138 copies/mL. A negative result does not preclude SARS-Cov-2 infection and should not be used as the sole basis for treatment or other patient management decisions. A negative result may occur with  improper specimen collection/handling, submission of specimen other than nasopharyngeal swab, presence of viral mutation(s) within the areas targeted by this assay, and inadequate number of viral copies(<138 copies/mL). A negative result must be combined with clinical observations, patient history, and epidemiological information. The expected result is Negative.  Fact Sheet for Patients:  bloggercourse.com  Fact Sheet for Healthcare Providers:  seriousbroker.it  This test is no t yet approved or cleared by the [place]  FDA and  has been authorized for detection and/or diagnosis of SARS-CoV-2 by FDA under an Emergency Use Authorization (EUA). This EUA will remain  in effect (meaning this test can be used) for the duration of the COVID-19 declaration under Section 564(b)(1) of the Act, 21 U.S.C.section 360bbb-3(b)(1), unless the authorization is terminated  or revoked sooner.       Influenza A by PCR NEGATIVE NEGATIVE Final   Influenza B by PCR NEGATIVE NEGATIVE Final    Comment: (NOTE) The Xpert Xpress SARS-CoV-2/FLU/RSV plus assay  is intended as an aid in the diagnosis of influenza from Nasopharyngeal swab specimens and should not be used as a sole basis for treatment. Nasal washings and aspirates are unacceptable for Xpert Xpress SARS-CoV-2/FLU/RSV testing.  Fact Sheet for Patients: bloggercourse.com  Fact Sheet for Healthcare Providers: seriousbroker.it  This test is not yet approved or cleared by the [place]  FDA and has been authorized for detection and/or diagnosis of SARS-CoV-2 by FDA under an Emergency Use Authorization (EUA). This EUA will remain in effect (meaning this test can be used) for the duration of the COVID-19 declaration under Section 564(b)(1) of the Act, 21 U.S.C. section 360bbb-3(b)(1), unless the authorization is terminated or revoked.     Resp Syncytial Virus by PCR NEGATIVE NEGATIVE Final    Comment: (NOTE) Fact Sheet for Patients: bloggercourse.com  Fact Sheet for Healthcare Providers: seriousbroker.it  This test is not yet approved or cleared by the [place]  FDA and has been authorized for detection and/or diagnosis of SARS-CoV-2 by FDA under an Emergency Use Authorization (EUA). This EUA will remain in effect (meaning this test can be used) for the duration of the COVID-19 declaration under Section 564(b)(1) of the Act, 21 U.S.C. section 360bbb-3(b)(1), unless the authorization is terminated or revoked.  Performed at [facility], [address] [place], [place] [address]   Respiratory (~20 pathogens) panel by PCR     Status: None   Collection Time: 07/13/24 10:12 PM   Specimen: Nasopharyngeal Swab; Respiratory  Result Value Ref Range Status   Adenovirus NOT DETECTED NOT DETECTED Final   Coronavirus 229E NOT DETECTED NOT DETECTED Final    Comment: (NOTE) The Coronavirus on the Respiratory Panel, DOES NOT test for the novel  Coronavirus (2019 nCoV)     Coronavirus HKU1 NOT DETECTED NOT DETECTED Final   Coronavirus NL63 NOT DETECTED NOT DETECTED Final   Coronavirus OC43 NOT DETECTED NOT DETECTED Final   Metapneumovirus NOT DETECTED NOT DETECTED Final   Rhinovirus / Enterovirus NOT DETECTED NOT DETECTED Final   Influenza A NOT DETECTED NOT DETECTED Final   Influenza B NOT DETECTED NOT DETECTED Final   Parainfluenza Virus 1 NOT DETECTED NOT DETECTED Final   Parainfluenza Virus 2 NOT DETECTED NOT DETECTED Final   Parainfluenza  Virus 3 NOT DETECTED NOT DETECTED Final   Parainfluenza Virus 4 NOT DETECTED NOT DETECTED Final   Respiratory Syncytial Virus NOT DETECTED NOT DETECTED Final   Bordetella pertussis NOT DETECTED NOT DETECTED Final   Bordetella Parapertussis NOT DETECTED NOT DETECTED Final   Chlamydophila pneumoniae NOT DETECTED NOT DETECTED Final   Mycoplasma pneumoniae NOT DETECTED NOT DETECTED Final    Comment: Performed at [facility], [address] [place], [place] [address]  Culture, blood (Routine X 2) w Reflex to ID Panel     Status: None   Collection Time: 07/17/24 12:03 PM   Specimen: BLOOD  Result Value Ref Range Status   Specimen Description BLOOD BLOOD RIGHT HAND  Final   Special Requests   Final    AEROBIC BOTTLE ONLY Blood Culture results may not be optimal due to an inadequate volume of blood received in culture bottles   Culture   Final    NO GROWTH 5 DAYS Performed at [facility], [address] [place], [place] [address]    Report Status 07/22/2024 FINAL  Final  Culture, blood (Routine X 2) w Reflex to ID Panel     Status: None   Collection Time: 07/17/24 12:07 PM   Specimen: BLOOD  Result Value Ref Range Status   Specimen Description BLOOD BLOOD LEFT HAND  Final   Special Requests   Final    AEROBIC BOTTLE ONLY Blood Culture results may not be optimal due to an inadequate volume of blood received in culture bottles   Culture   Final    NO GROWTH 5 DAYS Performed at [facility], [address], [place], [place] [address]    Report Status 07/22/2024 FINAL  Final  MRSA Next Gen by PCR, Nasal     Status: None   Collection Time: 07/19/24  8:52 AM   Specimen: Nasal Mucosa; Nasal Swab  Result Value Ref Range Status   MRSA by PCR Next Gen NOT DETECTED NOT DETECTED Final    Comment: (NOTE) The GeneXpert MRSA Assay (FDA approved for NASAL specimens only), is one component of a comprehensive MRSA colonization surveillance program. It is not intended to diagnose MRSA infection nor to guide or monitor treatment for MRSA infections. Test performance is not FDA approved in patients less than 47 years old. Performed at [facility], [address] [place], [place] [address]     Labs: CBC: Recent Labs  Lab 08/04/24 1111 08/05/24 0946 08/05/24 1235 08/06/24 0525  WBC 10.4 9.1 8.4 6.7  NEUTROABS  --   --  5.6  --   HGB 11.6* 11.0* 11.4* 11.3*  HCT 34.2* 33.6* 35.7* 35.8*  MCV 90 91.6 93.0 93.7  PLT 232 194 218 180   Basic Metabolic Panel: Recent Labs  Lab 08/04/24 1111 08/05/24 1235 08/06/24 0525  NA 140 141 139  K 4.0 4.3 4.0  CL 94* 98 98  CO2 29 33* 35*  GLUCOSE 76 102* 123*  BUN 16 15 13   CREATININE 1.03 1.03 0.88  CALCIUM  9.0 9.0 8.9  MG  --  1.6*  --    Liver Function Tests: Recent Labs  Lab 08/05/24 1235  AST 15  ALT 22  ALKPHOS 90  BILITOT 0.4  PROT 6.8  ALBUMIN 4.1   CBG: No results for input(s): GLUCAP in the last 168 hours.  Discharge time spent: greater than 30 minutes.  Signed: Charlie Patterson, MD [facility] 08/06/2024 "

## 2024-08-06 NOTE — Assessment & Plan Note (Addendum)
 Fluid status stable.  Euvolemic.  Continue usual medications.

## 2024-08-06 NOTE — Assessment & Plan Note (Signed)
 Received IV magnesium 

## 2024-08-06 NOTE — Evaluation (Signed)
 Physical Therapy Evaluation Patient Details Name: Ryan Hatfield MRN: [id number] DOB: 10/05/1965 Today's Date: 08/06/2024  History of Present Illness  Ryan Hatfield is a 59 y.o. year old male with medical history of hypertension, hyperlipidemia, atrial fibrillation, COPD, hypothyroidism, tobacco use disorder in remission presenting to the ED at request of his PCP for a decrease in hemoglobin.  Patient was recently admitted for acute on chronic hypoxic respiratory failure from 01/07-01/19.  Patient reports generalized weakness since discharge from the hospital but denies any rectal bleeding, hematuria, hematemesis. On arrival to the ED patient was noted to be HDS stable.  Lab work obtained.  CBC with normal leukocyte count, mild anemia at 11 with baseline around 14.  CMP overall unremarkable.  proBNP normal.  Given decline in hemoglobin, TR[facility] contacted for admission.  Clinical Impression  Patient noted to be in seated position at PT arrival in room, for an initial PT evaluation due to a decline in functional status, with baseline mobility reported as modI on 2L/min., and currently requiring modI for ambulation and transfer. The patient is A&O x 4, presenting with good willingness to work with PT. The patient resides in a house and lives with relatives with family/friend support. There are several inside the residence.  Vitals are stable with an SpO? of >90% on 2 L/min. Gait was assessed with no AD. Gait mechanic observations noted WNL. The overall clinical impression is that the patient presents with mild mobility limitations.     If plan is discharge home, recommend the following: A little help with walking and/or transfers;A little help with bathing/dressing/bathroom;Assistance with cooking/housework;Help with stairs or ramp for entrance   Can travel by private vehicle        Equipment Recommendations Rolling walker (2 wheels)  Recommendations for Other Services       Functional Status  Assessment Patient has had a recent decline in their functional status and demonstrates the ability to make significant improvements in function in a reasonable and predictable amount of time.     Precautions / Restrictions Precautions Precautions: Fall Precaution/Restrictions Comments: monitor HR and Sp02 Restrictions Weight Bearing Restrictions Per Provider Order: No      Mobility  Bed Mobility                    Transfers Overall transfer level: Needs assistance Equipment used: Rolling walker (2 wheels) Transfers: Sit to/from Stand Sit to Stand: Modified independent (Device/Increase time)           General transfer comment: no physical assistance required    Ambulation/Gait Ambulation/Gait assistance: Supervision, Modified independent (Device/Increase time) Gait Distance (Feet): 160 Feet Assistive device: Rolling walker (2 wheels) Gait Pattern/deviations: Step-through pattern Gait velocity: decreased        Stairs            Wheelchair Mobility     Tilt Bed    Modified Rankin (Stroke Patients Only)       Balance Overall balance assessment: Modified Independent                                           Pertinent Vitals/Pain Pain Assessment Pain Assessment: No/denies pain    Home Living Family/patient expects to be discharged to:: Private residence Living Arrangements: Other relatives (brother) Available Help at Discharge: Family Type of Home: House Home Access: Stairs to enter  Home Layout: One level   Additional Comments: planning to d/c home with son with a few steps to enter, single story home    Prior Function Prior Level of Function : Independent/Modified Independent             Mobility Comments: uses 02 at baseline, Mod I with mobility       Extremity/Trunk Assessment   Upper Extremity Assessment Upper Extremity Assessment: Overall WFL for tasks assessed    Lower Extremity  Assessment Lower Extremity Assessment: Overall WFL for tasks assessed    Cervical / Trunk Assessment Cervical / Trunk Assessment: Normal  Communication   Communication Communication: No apparent difficulties    Cognition Arousal: Alert Behavior During Therapy: WFL for tasks assessed/performed   PT - Cognitive impairments: No apparent impairments                         Following commands: Intact       Cueing Cueing Techniques: Verbal cues     General Comments      Exercises     Assessment/Plan    PT Assessment Patient does not need any further PT services  PT Problem List         PT Treatment Interventions DME instruction;Gait training;Stair training;Functional mobility training;Therapeutic activities;Therapeutic exercise;Balance training;Neuromuscular re-education;Cognitive remediation;Patient/family education;Wheelchair mobility training    PT Goals (Current goals can be found in the Care Plan section)  Acute Rehab PT Goals Patient Stated Goal: to go home to his son's house PT Goal Formulation: With patient/family Time For Goal Achievement: 08/27/24 Potential to Achieve Goals: Good    Frequency       Co-evaluation               AM-PAC PT 6 Clicks Mobility  Outcome Measure Help needed turning from your back to your side while in a flat bed without using bedrails?: None Help needed moving from lying on your back to sitting on the side of a flat bed without using bedrails?: None Help needed moving to and from a bed to a chair (including a wheelchair)?: None Help needed standing up from a chair using your arms (e.g., wheelchair or bedside chair)?: A Little Help needed to walk in hospital room?: A Little Help needed climbing 3-5 steps with a railing? : A Lot 6 Click Score: 20    End of Session Equipment Utilized During Treatment: Oxygen  Activity Tolerance: Patient tolerated treatment well Patient left: in bed;with call bell/phone within  reach;with bed alarm set;with family/visitor present Nurse Communication: Mobility status PT Visit Diagnosis: Difficulty in walking, not elsewhere classified (R26.2);Muscle weakness (generalized) (M62.81)    Time: 9091-9081 PT Time Calculation (min) (ACUTE ONLY): 10 min   Charges:   PT Evaluation $PT Eval Low Complexity: 1 Low   PT General Charges $$ ACUTE PT VISIT: 1 Visit         Ryan Hatfield DPT, PT    Ryan Hatfield 08/06/2024, 10:12 AM

## 2024-08-06 NOTE — Progress Notes (Signed)
 " Progress Note   Patient: Ryan Hatfield FMW:[id number] DOB: March 25, 1966 DOA: 08/05/2024     0 DOS: the patient was seen and examined on 08/06/2024   Brief hospital course: 59 y.o. year old male with medical history of hypertension, hyperlipidemia, atrial fibrillation, COPD, hypothyroidism, tobacco use disorder in remission presenting to the ED at request of his PCP for a decrease in hemoglobin.  Patient was recently admitted for acute on chronic hypoxic respiratory failure from 01/07-01/19.  Patient reports generalized weakness since discharge from the hospital but denies any rectal bleeding, hematuria, hematemesis. On arrival to the ED patient was noted to be HDS stable.  Lab work obtained.  CBC with normal leukocyte count, mild anemia at 11 with baseline around 14.  CMP overall unremarkable.  proBNP normal.  Given decline in hemoglobin, TRH contacted for admission.   1/31.  Hemoglobin 16.9 on 07/24/24.  Patient sent in for drop in hemoglobin.  Last hemoglobin 11.3.  Patient denies seeing any blood.  Gastroenterology to evaluate to set up plan.  Assessment and Plan: * Drop in hemoglobin Patient's hemoglobin 16.9 on 1/18. Last hemoglobin 11.3. Patient on Xarelto  held. Gastroenterology to see.  Patient denies seeing any blood.  Paroxysmal atrial fibrillation (HCC) Holding Xarelto .  Patient on Cardizem  CD and metoprolol .  Chronic respiratory failure with hypoxia (HCC) On 2 L of oxygen .  Respiratory status stable.  Essential hypertension Patient on Cardizem  CD and metoprolol   Chronic diastolic CHF (congestive heart failure) (HCC) Fluid status stable.  Euvolemic.  Mixed hyperlipidemia On atorvastatin   Acquired hypothyroidism On levothyroxine   Tobacco use disorder On nicotine  patch  Obesity, Class III, BMI 40-49.9 (morbid obesity) (HCC) BMI 44.37  Hypomagnesemia Received IV magnesium         Subjective: Patient has not noticed any bleeding.  Sent in for drop in hemoglobin.  No  abdominal pain.  Feels weak.  Physical Exam: Vitals:   08/05/24 2027 08/05/24 2035 08/06/24 0319 08/06/24 0745  BP: (!) 131/56  (!) 132/51 112/74  Pulse: (!) 59  (!) 56 68  Resp: 18     Temp: 97.9 F (36.6 C)  97.9 F (36.6 C) 98.1 F (36.7 C)  TempSrc: Oral  Oral Oral  SpO2: 99%  95% 94%  Weight:  (!) 137.2 kg (!) 136.3 kg   Height:  5' 9 (1.753 m)     Physical Exam HENT:     Head: Normocephalic.  Eyes:     General: Lids are normal.     Conjunctiva/sclera: Conjunctivae normal.  Cardiovascular:     Rate and Rhythm: Normal rate and regular rhythm.     Heart sounds: Normal heart sounds, S1 normal and S2 normal.  Pulmonary:     Breath sounds: No decreased breath sounds, wheezing, rhonchi or rales.  Abdominal:     Palpations: Abdomen is soft.     Tenderness: There is no abdominal tenderness.  Musculoskeletal:     Right lower leg: Swelling present.     Left lower leg: Swelling present.  Skin:    General: Skin is warm.     Findings: No rash.  Neurological:     Mental Status: He is alert and oriented to person, place, and time.     Data Reviewed: Creatinine 0.86, hemoglobin 11.3  Disposition: Status is: Observation Awaiting gastroenterology to set up a plan  Planned Discharge Destination: Home with home health    Time spent: 32 minutes  Author: Charlie Patterson, MD 08/06/2024 3:28 PM  For on call review [email or web address].  "

## 2024-08-06 NOTE — Assessment & Plan Note (Signed)
Patient on Cardizem CD and metoprolol.

## 2024-08-06 NOTE — Discharge Instructions (Addendum)
 You will have to hold your Xarelto  for 2 days prior to EGD and colonoscopy. Recommend following up with the PCP and checking another hemoglobin on follow-up appointment.

## 2024-08-06 NOTE — Assessment & Plan Note (Signed)
 On levothyroxine

## 2024-08-06 NOTE — Assessment & Plan Note (Addendum)
 Held Xarelto .  Patient on Cardizem  CD and metoprolol .  Can go back on Xarelto  as outpatient.

## 2024-08-06 NOTE — Assessment & Plan Note (Signed)
 BMI 44.37

## 2024-08-06 NOTE — Assessment & Plan Note (Addendum)
 On 2 L of oxygen .  Respiratory status stable.

## 2024-08-08 ENCOUNTER — Telehealth: Payer: Self-pay

## 2024-08-08 LAB — GLUCOSE, CAPILLARY: Glucose-Capillary: 148 mg/dL — ABNORMAL HIGH (ref 70–99)

## 2024-08-08 NOTE — Transitions of Care (Post Inpatient/ED Visit) (Signed)
 "  08/08/2024  Name: Ryan Hatfield MRN: [id number] DOB: Jul 27, 1965  Today's TOC FU Call Status: Today's TOC FU Call Status:: Successful TOC FU Call Completed TOC FU Call Complete Date: 08/08/24  Patient's Name and Date of Birth confirmed. DOB, Name  Transition Care Management Follow-up Telephone Call Date of Discharge: 08/06/24 Discharge Facility: [facility]) Type of Discharge: Inpatient Admission Primary Inpatient Discharge Diagnosis:: Drop in Hemoglobin How have you been since you were released from the hospital?: Better Any questions or concerns?: No  Items Reviewed: Did you receive and understand the discharge instructions provided?: Yes Medications obtained,verified, and reconciled?: Yes (Medications Reviewed) Any new allergies since your discharge?: No Dietary orders reviewed?: NA  Medications Reviewed Today: Medications Reviewed Today     Reviewed by Lavelle Charmaine NOVAK, LPN (Licensed Practical Nurse) on 08/08/24 at 1115  Med List Status: <None>   Medication Order Taking? Sig Documenting Provider Last Dose Status Informant  ACCU-CHEK GUIDE TEST test strip [id number]   [provider]  Active Self  albuterol  (PROVENTIL ) (2.5 MG/3ML) 0.083% nebulizer solution [id number] No Inhale 3 mLs (2.5 mg total) into the lungs every 4 (four) hours as needed for wheezing or shortness of breath. Lanetta Lingo, MD Unknown Active Self  albuterol  (VENTOLIN  HFA) 108 (90 Base) MCG/ACT inhaler [id number] No Inhale 2 puffs into the lungs every 6 (six) hours as needed for wheezing or shortness of breath. Lanetta Lingo, MD 08/05/2024 Morning Active Self  atorvastatin  (LIPITOR) 40 MG tablet [id number] No Take 1 tablet (40 mg total) by mouth daily. Corwin Antu, FNP 08/05/2024 Morning Active Self  Blood Glucose Monitoring Suppl (BLOOD GLUCOSE MONITOR SYSTEM) w/Device KIT [id number]  Use as directed to check blood sugar four times daily. Lanetta Lingo, MD  Active  Self  Cholecalciferol  (VITAMIN D3) 1.25 MG (50000 UT) CAPS [id number] No Take 1 capsule by mouth once a week. [provider] 08/04/2024 Active Self  diltiazem  (CARDIZEM  CD) 240 MG 24 hr capsule [id number] No Take 1 capsule (240 mg total) by mouth daily. Lanetta Lingo, MD 08/05/2024 Morning Active Self  folic acid  (FOLVITE ) 1 MG tablet [id number] No Take 1 tablet (1 mg total) by mouth daily. Dugal, Tabitha, FNP 08/05/2024 Morning Active Self  furosemide  (LASIX ) 40 MG tablet [id number] No Take 1 tablet (40 mg total) by mouth daily. Take an extra 20 mg (1/2 tablet) as needed for weight gain. Loistine Sober, NP 08/05/2024 Morning Active Self  Glucose Blood (BLOOD GLUCOSE TEST STRIPS) STRP [id number]  Use as directed to check blood sugar four times daily. Lanetta Lingo, MD  Active Self  Lancet Device MISC [id number]  1 each by Does not apply route as directed. Dispense based on patient and insurance preference. Use up to four times daily as directed. (FOR ICD-10 E10.9, E11.9). Lanetta Lingo, MD  Active Self  Lancets MISC [id number]  Use as directed to check blood sugar four times daily. Lanetta Lingo, MD  Active Self  levothyroxine  (SYNTHROID ) 50 MCG tablet [id number] No TAKE 1 TABLET BY MOUTH EVERY DAY Corwin Antu, FNP 08/05/2024 Morning Active Self  losartan  (COZAAR ) 25 MG tablet [id number] No Take 1 tablet (25 mg total) by mouth daily. Lanetta Lingo, MD 08/05/2024 Morning Active Self  metFORMIN  (GLUCOPHAGE -XR) 500 MG 24 hr tablet [id number] No Take 1 tablet (500 mg total) by mouth daily with breakfast. Agbata, Tochukwu, MD 08/05/2024 Morning Active Self  metoprolol  tartrate (LOPRESSOR ) 100 MG tablet [id number] No Take 1 tablet (100 mg total) by mouth 2 (two) times daily.  Lanetta Lingo, MD 08/04/2024 Active Self  mometasone -formoterol  (DULERA ) 200-5 MCG/ACT AERO [id number] No Inhale 1 puff into the lungs 2 (two) times daily. Lanetta Lingo, MD 08/05/2024 Morning Active Self  Nebulizers  (COMPRESSOR/NEBULIZER) MISC [id number]  1 ampule by Does not apply route every 6 (six) hours as needed (SOB/Wheezing). Lanetta Lingo, MD  Active Self  nicotine  (NICODERM CQ  - DOSED IN MG/24 HOURS) 14 mg/24hr patch [id number] No Place 1 patch (14 mg total) onto the skin daily. Dugal, Tabitha, FNP 08/05/2024 Morning Active Self  nitroGLYCERIN  (NITROSTAT ) 0.4 MG SL tablet [id number] No Place 1 tablet (0.4 mg total) under the tongue every 5 (five) minutes as needed for chest pain. Loistine Sober, NP Unknown Active Self  omeprazole  (PRILOSEC) 20 MG capsule [id number] No TAKE 1 CAPSULE BY MOUTH EVERY DAY Loistine Sober, NP 08/05/2024 Morning Active Self  rivaroxaban  (XARELTO ) 20 MG TABS tablet [id number] No Take 1 tablet (20 mg total) by mouth daily with supper. Lanetta Lingo, MD 08/04/2024 Active Self            Home Care and Equipment/Supplies: Were Home Health Services Ordered?: NA Any new equipment or medical supplies ordered?: NA  Functional Questionnaire: Do you need assistance with bathing/showering or dressing?: No Do you need assistance with meal preparation?: No Do you need assistance with eating?: No Do you have difficulty maintaining continence: No Do you need assistance with getting out of bed/getting out of a chair/moving?: No Do you have difficulty managing or taking your medications?: No  Follow up appointments reviewed: PCP Follow-up appointment confirmed?: Yes Date of PCP follow-up appointment?: 08/17/24 Follow-up Provider: [facility] Follow-up appointment confirmed?: NA Do you need transportation to your follow-up appointment?: No Do you understand care options if your condition(s) worsen?: Yes-patient verbalized understanding    SIGNATURE Charmaine Bloodgood, LPN [facility] Health Advisor [facility] You Are. We Are. [facility] Direct Dial [phone]  "

## 2024-08-08 NOTE — Telephone Encounter (Signed)
 NOTED

## 2024-08-09 ENCOUNTER — Telehealth: Payer: Self-pay | Admitting: *Deleted

## 2024-08-09 LAB — TYPE AND SCREEN
ABO/RH(D): A NEG
Antibody Screen: POSITIVE
Donor AG Type: NEGATIVE
Donor AG Type: NEGATIVE
PT AG Type: NEGATIVE
Unit division: 0
Unit division: 0

## 2024-08-09 LAB — BPAM RBC
Blood Product Expiration Date: 202602252359
Blood Product Expiration Date: 202602252359
Unit Type and Rh: 600
Unit Type and Rh: 600

## 2024-08-09 NOTE — Telephone Encounter (Signed)
Attempted to contact pt. There was no answer and no option to leave a message.

## 2024-08-09 NOTE — Telephone Encounter (Signed)
 I had sent in the rX for zebound last visit and started a prior auth but then pt went back into the hospital for workup for worsening anemia.   From a quick glance at his ER visit they are setting up GI f/u with outpatient EGD and colonoscopy. I would like to hold the GLP (zepbound ) for now and advise pt to inquire from GI once done with workup to rule out bleed if zepbound  could be started.

## 2024-08-09 NOTE — Telephone Encounter (Signed)
 Copied from CRM [id number]. Topic: Clinical - Prescription Issue >> Aug 08, 2024  3:17 PM Eva FALCON wrote: Reason for CRM: Pt states Tabitha Dugal was going to prescribed this patient Zepbound . State he was told she was going to get in touch with his insurance company regarding the zepbound . States he has not heard anything back and I did not see anything in his chart regarding this medication. Pt requesting call back [phone].

## 2024-08-09 NOTE — Telephone Encounter (Signed)
 Reaching out to pharmacy for clarification.  I did send in the RX last visit prior to him being admitted for worsening anemia.  Pending GI workup.

## 2024-08-11 NOTE — Telephone Encounter (Signed)
 Spoke with pt and he is aware of Tabitha's message. Nothing further was needed at this time.

## 2024-08-16 ENCOUNTER — Ambulatory Visit: Admitting: Sleep Medicine

## 2024-08-17 ENCOUNTER — Inpatient Hospital Stay: Admitting: Family

## 2024-09-02 ENCOUNTER — Ambulatory Visit: Admitting: Cardiology

## 2024-09-28 ENCOUNTER — Ambulatory Visit: Admitting: Pulmonary Disease

## 2024-11-02 ENCOUNTER — Ambulatory Visit: Admitting: Cardiology

## 2025-01-30 ENCOUNTER — Encounter: Admitting: Family
